# Patient Record
Sex: Female | Born: 1955 | Race: Black or African American | Hispanic: No | Marital: Single | Smoking: Current every day smoker
Health system: Southern US, Community
[De-identification: ages and names within clinical notes are randomized; demographics above are authoritative.]

## PROBLEM LIST (undated history)

## (undated) DIAGNOSIS — F329 Major depressive disorder, single episode, unspecified: Secondary | ICD-10-CM

## (undated) DIAGNOSIS — F32A Depression, unspecified: Secondary | ICD-10-CM

## (undated) DIAGNOSIS — F419 Anxiety disorder, unspecified: Secondary | ICD-10-CM

## (undated) DIAGNOSIS — M1990 Unspecified osteoarthritis, unspecified site: Secondary | ICD-10-CM

## (undated) DIAGNOSIS — I10 Essential (primary) hypertension: Secondary | ICD-10-CM

## (undated) HISTORY — PX: OTHER SURGICAL HISTORY: SHX169

---

## 2003-12-20 ENCOUNTER — Encounter (INDEPENDENT_AMBULATORY_CARE_PROVIDER_SITE_OTHER): Payer: Self-pay | Admitting: Family Medicine

## 2006-11-23 ENCOUNTER — Ambulatory Visit: Payer: Self-pay | Admitting: Family Medicine

## 2006-12-02 ENCOUNTER — Encounter (INDEPENDENT_AMBULATORY_CARE_PROVIDER_SITE_OTHER): Payer: Self-pay | Admitting: Family Medicine

## 2006-12-03 ENCOUNTER — Encounter (INDEPENDENT_AMBULATORY_CARE_PROVIDER_SITE_OTHER): Payer: Self-pay | Admitting: Family Medicine

## 2006-12-09 ENCOUNTER — Ambulatory Visit: Payer: Self-pay | Admitting: Family Medicine

## 2007-01-06 ENCOUNTER — Ambulatory Visit: Payer: Self-pay | Admitting: Family Medicine

## 2007-01-10 ENCOUNTER — Encounter: Payer: Self-pay | Admitting: Family Medicine

## 2007-01-10 DIAGNOSIS — M545 Low back pain, unspecified: Secondary | ICD-10-CM | POA: Insufficient documentation

## 2007-01-10 DIAGNOSIS — G43909 Migraine, unspecified, not intractable, without status migrainosus: Secondary | ICD-10-CM | POA: Insufficient documentation

## 2007-01-10 DIAGNOSIS — I10 Essential (primary) hypertension: Secondary | ICD-10-CM | POA: Insufficient documentation

## 2007-01-10 DIAGNOSIS — F411 Generalized anxiety disorder: Secondary | ICD-10-CM | POA: Insufficient documentation

## 2007-01-10 DIAGNOSIS — K279 Peptic ulcer, site unspecified, unspecified as acute or chronic, without hemorrhage or perforation: Secondary | ICD-10-CM | POA: Insufficient documentation

## 2007-01-10 DIAGNOSIS — F329 Major depressive disorder, single episode, unspecified: Secondary | ICD-10-CM

## 2007-01-10 DIAGNOSIS — F32A Depression, unspecified: Secondary | ICD-10-CM | POA: Insufficient documentation

## 2007-01-10 DIAGNOSIS — E785 Hyperlipidemia, unspecified: Secondary | ICD-10-CM | POA: Insufficient documentation

## 2007-01-10 DIAGNOSIS — M1990 Unspecified osteoarthritis, unspecified site: Secondary | ICD-10-CM | POA: Insufficient documentation

## 2007-01-13 ENCOUNTER — Encounter (INDEPENDENT_AMBULATORY_CARE_PROVIDER_SITE_OTHER): Payer: Self-pay | Admitting: Family Medicine

## 2007-06-06 ENCOUNTER — Encounter (INDEPENDENT_AMBULATORY_CARE_PROVIDER_SITE_OTHER): Payer: Self-pay | Admitting: Family Medicine

## 2007-06-07 ENCOUNTER — Telehealth (INDEPENDENT_AMBULATORY_CARE_PROVIDER_SITE_OTHER): Payer: Self-pay | Admitting: Family Medicine

## 2007-06-08 ENCOUNTER — Encounter (INDEPENDENT_AMBULATORY_CARE_PROVIDER_SITE_OTHER): Payer: Self-pay | Admitting: Family Medicine

## 2007-06-16 ENCOUNTER — Ambulatory Visit: Payer: Self-pay | Admitting: Family Medicine

## 2007-06-16 LAB — CONVERTED CEMR LAB
Cholesterol, target level: 200 mg/dL
HDL goal, serum: 40 mg/dL
LDL Goal: 130 mg/dL

## 2007-06-18 ENCOUNTER — Encounter (INDEPENDENT_AMBULATORY_CARE_PROVIDER_SITE_OTHER): Payer: Self-pay | Admitting: Family Medicine

## 2007-06-19 LAB — CONVERTED CEMR LAB
Potassium: 3.9 meq/L (ref 3.5–5.3)
Sodium: 140 meq/L (ref 135–145)

## 2007-06-20 ENCOUNTER — Telehealth (INDEPENDENT_AMBULATORY_CARE_PROVIDER_SITE_OTHER): Payer: Self-pay | Admitting: *Deleted

## 2007-06-20 ENCOUNTER — Encounter (INDEPENDENT_AMBULATORY_CARE_PROVIDER_SITE_OTHER): Payer: Self-pay | Admitting: Family Medicine

## 2007-07-14 ENCOUNTER — Encounter (INDEPENDENT_AMBULATORY_CARE_PROVIDER_SITE_OTHER): Payer: Self-pay | Admitting: Family Medicine

## 2007-07-14 ENCOUNTER — Telehealth (INDEPENDENT_AMBULATORY_CARE_PROVIDER_SITE_OTHER): Payer: Self-pay | Admitting: *Deleted

## 2007-07-14 ENCOUNTER — Other Ambulatory Visit: Admission: RE | Admit: 2007-07-14 | Discharge: 2007-07-14 | Payer: Self-pay | Admitting: Family Medicine

## 2007-07-14 ENCOUNTER — Ambulatory Visit: Payer: Self-pay | Admitting: Family Medicine

## 2007-07-15 ENCOUNTER — Telehealth (INDEPENDENT_AMBULATORY_CARE_PROVIDER_SITE_OTHER): Payer: Self-pay | Admitting: *Deleted

## 2007-07-15 LAB — CONVERTED CEMR LAB
Eosinophils Absolute: 0.2 10*3/uL (ref 0.0–0.7)
Eosinophils Relative: 4 % (ref 0–5)
HCT: 41.7 % (ref 36.0–46.0)
Hemoglobin: 13.9 g/dL (ref 12.0–15.0)
Lymphs Abs: 2.5 10*3/uL (ref 0.7–3.3)
MCV: 91.2 fL (ref 78.0–100.0)
Monocytes Relative: 7 % (ref 3–11)
RBC: 4.57 M/uL (ref 3.87–5.11)
WBC: 5.2 10*3/uL (ref 4.0–10.5)

## 2007-07-19 ENCOUNTER — Ambulatory Visit (HOSPITAL_COMMUNITY): Admission: RE | Admit: 2007-07-19 | Discharge: 2007-07-19 | Payer: Self-pay | Admitting: Family Medicine

## 2007-07-20 ENCOUNTER — Telehealth (INDEPENDENT_AMBULATORY_CARE_PROVIDER_SITE_OTHER): Payer: Self-pay | Admitting: *Deleted

## 2007-08-24 ENCOUNTER — Ambulatory Visit: Payer: Self-pay | Admitting: Family Medicine

## 2007-09-26 ENCOUNTER — Encounter (INDEPENDENT_AMBULATORY_CARE_PROVIDER_SITE_OTHER): Payer: Self-pay | Admitting: Family Medicine

## 2007-10-08 ENCOUNTER — Encounter (INDEPENDENT_AMBULATORY_CARE_PROVIDER_SITE_OTHER): Payer: Self-pay | Admitting: Family Medicine

## 2008-02-12 ENCOUNTER — Emergency Department (HOSPITAL_COMMUNITY): Admission: EM | Admit: 2008-02-12 | Discharge: 2008-02-13 | Payer: Self-pay | Admitting: Emergency Medicine

## 2008-02-14 ENCOUNTER — Ambulatory Visit: Payer: Self-pay | Admitting: Family Medicine

## 2008-02-27 ENCOUNTER — Encounter (INDEPENDENT_AMBULATORY_CARE_PROVIDER_SITE_OTHER): Payer: Self-pay | Admitting: Family Medicine

## 2008-02-27 ENCOUNTER — Telehealth (INDEPENDENT_AMBULATORY_CARE_PROVIDER_SITE_OTHER): Payer: Self-pay | Admitting: *Deleted

## 2008-03-13 ENCOUNTER — Ambulatory Visit: Payer: Self-pay | Admitting: Family Medicine

## 2008-03-13 DIAGNOSIS — D649 Anemia, unspecified: Secondary | ICD-10-CM | POA: Insufficient documentation

## 2008-03-13 DIAGNOSIS — R809 Proteinuria, unspecified: Secondary | ICD-10-CM | POA: Insufficient documentation

## 2008-03-14 ENCOUNTER — Encounter (INDEPENDENT_AMBULATORY_CARE_PROVIDER_SITE_OTHER): Payer: Self-pay | Admitting: Family Medicine

## 2008-03-14 ENCOUNTER — Telehealth (INDEPENDENT_AMBULATORY_CARE_PROVIDER_SITE_OTHER): Payer: Self-pay | Admitting: *Deleted

## 2008-03-14 LAB — CONVERTED CEMR LAB
AST: 14 units/L (ref 0–37)
Albumin: 4.5 g/dL (ref 3.5–5.2)
Alkaline Phosphatase: 88 units/L (ref 39–117)
BUN: 12 mg/dL (ref 6–23)
Basophils Relative: 0 % (ref 0–1)
Eosinophils Absolute: 0.1 10*3/uL (ref 0.0–0.7)
Eosinophils Relative: 2 % (ref 0–5)
HCT: 33.8 % — ABNORMAL LOW (ref 36.0–46.0)
HDL: 50 mg/dL (ref >39)
LDL Cholesterol: 136 mg/dL — ABNORMAL HIGH (ref 0–99)
MCHC: 33.1 g/dL (ref 30.0–36.0)
MCV: 89.2 fL (ref 78.0–100.0)
Monocytes Relative: 7 % (ref 3–12)
Neutrophils Relative %: 42 % — ABNORMAL LOW (ref 43–77)
Platelets: 192 10*3/uL (ref 150–400)
Potassium: 3.4 meq/L — ABNORMAL LOW (ref 3.5–5.3)
Sodium: 141 meq/L (ref 135–145)
TSH: 1.116 microintl units/mL (ref 0.350–5.50)
Total Bilirubin: 0.4 mg/dL (ref 0.3–1.2)
Total CHOL/HDL Ratio: 4.4
Triglycerides: 165 mg/dL — ABNORMAL HIGH (ref <150)
VLDL: 33 mg/dL (ref 0–40)

## 2008-03-22 ENCOUNTER — Encounter (INDEPENDENT_AMBULATORY_CARE_PROVIDER_SITE_OTHER): Payer: Self-pay | Admitting: Family Medicine

## 2008-03-26 ENCOUNTER — Encounter: Payer: Self-pay | Admitting: Orthopedic Surgery

## 2008-03-26 ENCOUNTER — Ambulatory Visit (HOSPITAL_COMMUNITY): Admission: RE | Admit: 2008-03-26 | Discharge: 2008-03-26 | Payer: Self-pay | Admitting: Family Medicine

## 2008-03-27 ENCOUNTER — Telehealth (INDEPENDENT_AMBULATORY_CARE_PROVIDER_SITE_OTHER): Payer: Self-pay | Admitting: *Deleted

## 2008-06-28 ENCOUNTER — Ambulatory Visit: Payer: Self-pay | Admitting: Family Medicine

## 2008-06-28 DIAGNOSIS — F17200 Nicotine dependence, unspecified, uncomplicated: Secondary | ICD-10-CM | POA: Insufficient documentation

## 2008-06-29 LAB — CONVERTED CEMR LAB
Basophils Absolute: 0 10*3/uL (ref 0.0–0.1)
Basophils Relative: 0 % (ref 0–1)
Eosinophils Absolute: 0.1 10*3/uL (ref 0.0–0.7)
Eosinophils Relative: 2 % (ref 0–5)
HCT: 36.9 % (ref 36.0–46.0)
Hemoglobin: 12.6 g/dL (ref 12.0–15.0)
MCHC: 34.1 g/dL (ref 30.0–36.0)
MCV: 88.7 fL (ref 78.0–100.0)
Monocytes Absolute: 0.5 10*3/uL (ref 0.1–1.0)
Monocytes Relative: 8 % (ref 3–12)
RBC: 4.16 M/uL (ref 3.87–5.11)
RDW: 13.5 % (ref 11.5–15.5)
TIBC: 376 ug/dL (ref 250–470)

## 2008-07-13 ENCOUNTER — Telehealth (INDEPENDENT_AMBULATORY_CARE_PROVIDER_SITE_OTHER): Payer: Self-pay | Admitting: *Deleted

## 2008-07-23 ENCOUNTER — Encounter (INDEPENDENT_AMBULATORY_CARE_PROVIDER_SITE_OTHER): Payer: Self-pay | Admitting: Family Medicine

## 2008-08-07 ENCOUNTER — Ambulatory Visit: Payer: Self-pay | Admitting: Family Medicine

## 2008-08-07 DIAGNOSIS — J309 Allergic rhinitis, unspecified: Secondary | ICD-10-CM | POA: Insufficient documentation

## 2008-08-07 DIAGNOSIS — K219 Gastro-esophageal reflux disease without esophagitis: Secondary | ICD-10-CM | POA: Insufficient documentation

## 2008-09-19 ENCOUNTER — Encounter (INDEPENDENT_AMBULATORY_CARE_PROVIDER_SITE_OTHER): Payer: Self-pay | Admitting: Family Medicine

## 2008-10-08 ENCOUNTER — Telehealth (INDEPENDENT_AMBULATORY_CARE_PROVIDER_SITE_OTHER): Payer: Self-pay | Admitting: *Deleted

## 2008-11-06 ENCOUNTER — Ambulatory Visit: Payer: Self-pay | Admitting: Family Medicine

## 2008-12-10 ENCOUNTER — Telehealth (INDEPENDENT_AMBULATORY_CARE_PROVIDER_SITE_OTHER): Payer: Self-pay | Admitting: Family Medicine

## 2009-08-16 ENCOUNTER — Encounter (INDEPENDENT_AMBULATORY_CARE_PROVIDER_SITE_OTHER): Payer: Self-pay | Admitting: Family Medicine

## 2009-09-03 ENCOUNTER — Emergency Department (HOSPITAL_COMMUNITY): Admission: EM | Admit: 2009-09-03 | Discharge: 2009-09-03 | Payer: Self-pay | Admitting: Emergency Medicine

## 2010-07-07 ENCOUNTER — Ambulatory Visit: Payer: Self-pay | Admitting: Orthopedic Surgery

## 2010-07-07 DIAGNOSIS — M169 Osteoarthritis of hip, unspecified: Secondary | ICD-10-CM | POA: Insufficient documentation

## 2010-07-08 ENCOUNTER — Encounter: Payer: Self-pay | Admitting: Orthopedic Surgery

## 2010-07-08 ENCOUNTER — Telehealth: Payer: Self-pay | Admitting: Orthopedic Surgery

## 2010-08-29 ENCOUNTER — Encounter: Payer: Self-pay | Admitting: Orthopedic Surgery

## 2011-01-06 NOTE — Letter (Signed)
Summary: History form  History form   Imported By: Jacklynn Ganong 07/08/2010 14:20:23  _____________________________________________________________________  External Attachment:    Type:   Image     Comment:   External Document

## 2011-01-06 NOTE — Progress Notes (Signed)
Summary: Referral to [facility].  Phone Note Outgoing Call   Call placed by: Waldon Reining,  July 08, 2010 9:28 AM Call placed to: Specialist Action Taken: Information Sent Summary of Call: I faxed a referral for this patient to [facility] for a hip replacement.

## 2011-01-06 NOTE — Letter (Signed)
Summary: *Orthopedic Consult Note  [facility]  [address]  [place], [place] [address]   Phone: [phone]  Fax: [phone]    Re:    Nicole Ibarra DOB:    06/28/1956   Dear: Dr. Regino Schultze   Thank you for requesting that we see the above patient for consultation.  A copy of the detailed office note will be sent under separate cover, for your review.  Evaluation today is consistent with: osteoarthritis RIGHT hip severe   Our recommendation is for: referral to [facility] because she is uninsured and will require an extensive reconstruction       Thank you for this opportunity to look after your patient.  Sincerely,   Terrance Mass. MD.

## 2011-01-06 NOTE — Assessment & Plan Note (Signed)
Summary: rt hip pain needs xr/aware to pay $100/bsf   Vital Signs:  Patient profile:   55 year old female Height:      64 inches Weight:      208 pounds Pulse rate:   72 / minute Resp:     16 per minute  Vitals Entered By: Fuller Canada MD (July 07, 2010 3:18 PM)  Visit Type:  new patient Referring Provider:  self Primary Provider:  Dr. Regino Schultze  CC:  right hip pain.  History of Present Illness: I saw Nicole Ibarra in the office today for an initial visit.  She is a 55 years old woman with the complaint of:  right hip pain.  Needs xrays.  2009 right hip and pelvis xrays [facility] for review.  Meds: Doxazosyn, Lisinopril, Meloxicam, Citalopram, Amlodipine, Hydrocodone 5.  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  Her pain is related as a 10 out of 10.  She complains of stiffness does not have a leg length discrepancy at this time.  Occasionally she'll have some LEFT hip pain she denies any numbness she has some mild discomfort in her back at times.  Anti-inflammatories and Vicodin 5 mg have not provided relief and she comes in for evaluation with previous x-rays 2 years ago showing arthritis of the RIGHT hip  Allergies (verified): No Known Drug Allergies  Family History: Father: Deceased: 2s HTN and prostate cancer  Mother: 12 HTN, DM Siblings: sisters x 5 - HTN Brother: 2 x: One 35 colon cancer. Diagnosed in his 3s. Other in his 63s - W&L FH of Cancer:  Family History of Arthritis  Social History: Current Smoker 5 per day Occupation: CNA No ETOH no alcohol no caffeine Single No drug use. 12th grade ed.  Review of Systems Constitutional:  Denies weight loss, weight gain, fever, chills, and fatigue. Cardiovascular:  Denies chest pain, palpitations, fainting, and murmurs. Respiratory:   Denies short of breath, wheezing, couch, tightness, pain on inspiration, and snoring . Gastrointestinal:  Complains of heartburn and constipation; denies nausea, vomiting, diarrhea, and blood in your stools. Genitourinary:  Denies frequency, urgency, difficulty urinating, painful urination, flank pain, and bleeding in urine. Neurologic:  Denies numbness, tingling, unsteady gait, dizziness, tremors, and seizure. Musculoskeletal:  Complains of joint pain; denies swelling, instability, stiffness, redness, heat, and muscle pain. Endocrine:  Denies excessive thirst, exessive urination, and heat or cold intolerance. Psychiatric:  Denies nervousness, depression, anxiety, and hallucinations. Skin:  Denies changes in the skin, poor healing, rash, itching, and redness. HEENT:  Denies blurred or double vision, eye pain, redness, and watering. Immunology:  Denies seasonal allergies, sinus problems, and allergic to bee stings. Hemoatologic:  Denies easy bleeding and brusing.  Physical Exam  Msk:  The patient is well developed and nourished, with normal grooming and hygiene. The body habitus is  medium  Vital signs are stable as recorded in her height is 5 foot 4 her weight is 208 pounds Pulses:  pulses normal in all 4 extremities Extremities:  no clubbing, cyanosis, edema, or deformity noted with normal full range of motion of all joints  This is one of the stiffest hips  I have ever seen, she only had 60 of hip flexion, I could not internally or externally rotate the hip.  I can  only abduct to 5 and adductor 10.  Her LEFT hip flexed about 90.  No leg length discrepancy  Muscle tone normal  Hip stability normal  No greater trochanteric tenderness there  Neurologic:  The coordination and sensation were normal  The reflexes were normal   Skin:  intact without lesions or rashes Inguinal Nodes:  no significant adenopathy Psych:  alert and cooperative; normal mood and affect; normal attention  span and concentration   Impression & Recommendations:  Problem # 1:  OSTEOARTHRITIS, HIP, RIGHT (ICD-715.95) Assessment New radiographs were obtained today and compared to previous films done at the hospital  Pelvis was done and AP lateral RIGHT hip  The hip is  captured in the acetabulum by surrounding osteophytes.  This reconstruction is more than I can handle and I recommended she go to [facility].  A referral to [facility] is probably not going to be successful because she is uninsured   Orders: Orthopedic Surgeon Referral Pharmacist, hospital) New Patient Level III [phone]) Pelvis x-ray, 1/2 views (60454) Hip x-ray unilateral complete, minimum 2 views (09811)  Patient Instructions: 1)  You need a hip replacement  2)  Referral to [facility] for Total Hip Repalcement

## 2011-01-06 NOTE — Letter (Signed)
Summary: Medical record request Disab Determination  Medical record request Disab Determination   Imported By: Cammie Sickle 09/27/2010 19:22:07  _____________________________________________________________________  External Attachment:    Type:   Image     Comment:   External Document

## 2011-08-31 LAB — CBC
MCHC: 34.8
MCV: 88.8
RBC: 3.86 — ABNORMAL LOW
RDW: 13.9

## 2011-08-31 LAB — BASIC METABOLIC PANEL
CO2: 25
Calcium: 9.5
Chloride: 104
Creatinine, Ser: 1.25 — ABNORMAL HIGH
GFR calc Af Amer: 54 — ABNORMAL LOW
Glucose, Bld: 115 — ABNORMAL HIGH

## 2011-08-31 LAB — DIFFERENTIAL
Basophils Absolute: 0
Basophils Relative: 1
Eosinophils Absolute: 0.3
Monocytes Absolute: 0.5
Monocytes Relative: 8
Neutro Abs: 2.7
Neutrophils Relative %: 46

## 2011-09-05 ENCOUNTER — Emergency Department (HOSPITAL_COMMUNITY)
Admission: EM | Admit: 2011-09-05 | Discharge: 2011-09-05 | Disposition: A | Discharge status: Home / Self Care | Payer: Self-pay | Attending: Emergency Medicine | Admitting: Emergency Medicine

## 2011-09-05 ENCOUNTER — Emergency Department (HOSPITAL_COMMUNITY): Payer: Self-pay

## 2011-09-05 ENCOUNTER — Other Ambulatory Visit: Payer: Self-pay

## 2011-09-05 ENCOUNTER — Encounter: Payer: Self-pay | Admitting: Emergency Medicine

## 2011-09-05 DIAGNOSIS — F439 Reaction to severe stress, unspecified: Secondary | ICD-10-CM

## 2011-09-05 DIAGNOSIS — Z733 Stress, not elsewhere classified: Secondary | ICD-10-CM | POA: Insufficient documentation

## 2011-09-05 DIAGNOSIS — F3289 Other specified depressive episodes: Secondary | ICD-10-CM | POA: Insufficient documentation

## 2011-09-05 DIAGNOSIS — F329 Major depressive disorder, single episode, unspecified: Secondary | ICD-10-CM | POA: Insufficient documentation

## 2011-09-05 DIAGNOSIS — I10 Essential (primary) hypertension: Secondary | ICD-10-CM | POA: Insufficient documentation

## 2011-09-05 DIAGNOSIS — R079 Chest pain, unspecified: Secondary | ICD-10-CM | POA: Insufficient documentation

## 2011-09-05 DIAGNOSIS — R110 Nausea: Secondary | ICD-10-CM | POA: Insufficient documentation

## 2011-09-05 HISTORY — DX: Essential (primary) hypertension: I10

## 2011-09-05 HISTORY — DX: Unspecified osteoarthritis, unspecified site: M19.90

## 2011-09-05 HISTORY — DX: Depression, unspecified: F32.A

## 2011-09-05 HISTORY — DX: Anxiety disorder, unspecified: F41.9

## 2011-09-05 HISTORY — DX: Major depressive disorder, single episode, unspecified: F32.9

## 2011-09-05 LAB — CARDIAC PANEL(CRET KIN+CKTOT+MB+TROPI): CK, MB: 3.6 ng/mL (ref 0.3–4.0)

## 2011-09-05 LAB — URINALYSIS, ROUTINE W REFLEX MICROSCOPIC
Bilirubin Urine: NEGATIVE
Hgb urine dipstick: NEGATIVE
Ketones, ur: NEGATIVE mg/dL
Protein, ur: NEGATIVE mg/dL
Specific Gravity, Urine: 1.01 (ref 1.005–1.030)
Urobilinogen, UA: 0.2 mg/dL (ref 0.0–1.0)

## 2011-09-05 LAB — CBC
Hemoglobin: 12.1 g/dL (ref 12.0–15.0)
MCH: 31 pg (ref 26.0–34.0)
MCHC: 34 g/dL (ref 30.0–36.0)
MCV: 91.3 fL (ref 78.0–100.0)
RBC: 3.9 MIL/uL (ref 3.87–5.11)

## 2011-09-05 LAB — DIFFERENTIAL
Basophils Relative: 0 % (ref 0–1)
Eosinophils Absolute: 0.1 10*3/uL (ref 0.0–0.7)
Eosinophils Relative: 2 % (ref 0–5)
Lymphs Abs: 1.7 10*3/uL (ref 0.7–4.0)
Monocytes Relative: 6 % (ref 3–12)

## 2011-09-05 LAB — BASIC METABOLIC PANEL
BUN: 13 mg/dL (ref 6–23)
Calcium: 9.8 mg/dL (ref 8.4–10.5)
Creatinine, Ser: 1.02 mg/dL (ref 0.50–1.10)
GFR calc non Af Amer: 56 mL/min — ABNORMAL LOW (ref >60)
Glucose, Bld: 98 mg/dL (ref 70–99)

## 2011-09-05 MED ORDER — SODIUM CHLORIDE 0.9 % IV SOLN
INTRAVENOUS | Status: DC
  Administered 2011-09-05: 16:00:00 via INTRAVENOUS

## 2011-09-05 NOTE — ED Provider Notes (Signed)
Scribed for Flint Melter, MD, the patient was seen in room APA02/APA02. This chart was scribed by [employer]. The patient's care started at 15:06  CSN: [id number] Arrival date & time: 09/05/2011  2:40 PM  Chief Complaint  Patient presents with  . Chest Pain   HPI Nicole Ibarra is a 55 y.o. female with a history of HTN who presents to the Emergency Department complaining of mid sternal Chest pain starting at about 2p.m 09/05/2011. Associated symptoms include currently resolved nausea.Chest pain is without provocation but is alleviated by laying still. Per family, patient is under a lot of stress in her relationship with her boyfriend. Patient appears depressed and is tearful upon examination. Denies vomiting, shortness of breath, alcohol use, smoking tobacco or drug abuse, or a history of heart problems. Reports mild relief from chest pain upon examination.  HPI ELEMENTS: Location: Mid Sternal chest  Onset: 2pm 09/05/2011 Duration: 1 hour  Timing: constant Severity: 8/10 on NPS  Modifying factors: alleviated by laying still Context: as above  Associated symptoms: as above     Past Medical History  Diagnosis Date  . Hypertension   . Arthritis   . Depression   . Anxiety     History reviewed. No pertinent past surgical history.  History reviewed. No pertinent family history.  History  Substance Use Topics  . Smoking status: Never Smoker   . Smokeless tobacco: Not on file  . Alcohol Use: No    OB History    Grav Para Term Preterm Abortions TAB SAB Ect Mult Living                  Review of Systems  Cardiovascular: Positive for chest pain.  Gastrointestinal: Positive for nausea. Negative for vomiting and diarrhea.  All other systems reviewed and are negative.    Allergies  Review of patient's allergies indicates no known allergies.  Home Medications  No current outpatient prescriptions on file.  BP 204/84  Pulse 65  Temp(Src) 98.5 F (36.9 C) (Oral)   Resp 20  Ht 5\' 4"  (1.626 m)  Wt 214 lb (97.07 kg)  BMI 36.73 kg/m2  SpO2 100%  Physical Exam  Nursing note and vitals reviewed. Constitutional: She is oriented to person, place, and time. She appears well-developed and well-nourished. No distress.  HENT:  Head: Normocephalic and atraumatic.  Right Ear: External ear normal.  Left Ear: External ear normal.  Mouth/Throat: Oropharynx is clear and moist. No oropharyngeal exudate.  Eyes: Conjunctivae and EOM are normal. Pupils are equal, round, and reactive to light.  Neck: Normal range of motion. Neck supple. No tracheal deviation present.  Cardiovascular: Normal rate, regular rhythm and normal heart sounds.   No murmur heard. Pulmonary/Chest: Effort normal and breath sounds normal. No respiratory distress. She has no wheezes. She has no rales. She exhibits tenderness (Right anterior chest wall tenderness).  Abdominal: Soft. Bowel sounds are normal. She exhibits no distension. There is no tenderness. There is no rebound and no guarding.  Musculoskeletal:       No spinal tenderness Limited ROM in the right hip due to arthritis Normal ROM in upper extremities  Lymphadenopathy:    She has no cervical adenopathy.  Neurological: She is alert and oriented to person, place, and time. No cranial nerve deficit.  Skin: Skin is warm and dry. No rash noted. She is not diaphoretic. No erythema.  Psychiatric:       Patient is depressed and tearful    ED Course  Procedures  ED ECG REPORT   Date: 09/05/2011  EKG Time: 3:45 PM  Rate: 68           Rhythm: normal sinus rhythm,   Axis: normal  Intervals:none  ST&T Change: none  Narrative Interpretation: PAC, Normal QRS           OTHER DATA REsults Nursing notes, vital signs, and past medical records reviewed.  EKG: {ekg findings:315101::"normal EKG, normal sinus rhythm","unchanged from previous tracings"  DIAGNOSTIC STUDIES: Oxygen Saturation is 100% on room air, normal by my  interpretation.    LABS / RADIOLOGY: Reviewed-NAD  Results for orders placed during the hospital encounter of 09/05/11  CARDIAC PANEL(CRET KIN+CKTOT+MB+TROPI)      Component Value Range   Total CK 182 (*) 7 - 177 (U/L)   CK, MB 3.6  0.3 - 4.0 (ng/mL)   Troponin I <0.30  <0.30 (ng/mL)   Relative Index 2.0  0.0 - 2.5   CBC      Component Value Range   WBC 5.2  4.0 - 10.5 (K/uL)   RBC 3.90  3.87 - 5.11 (MIL/uL)   Hemoglobin 12.1  12.0 - 15.0 (g/dL)   HCT 16.1 (*) 09.6 - 46.0 (%)   MCV 91.3  78.0 - 100.0 (fL)   MCH 31.0  26.0 - 34.0 (pg)   MCHC 34.0  30.0 - 36.0 (g/dL)   RDW 04.5  40.9 - 81.1 (%)   Platelets 171  150 - 400 (K/uL)  DIFFERENTIAL      Component Value Range   Neutrophils Relative 59  43 - 77 (%)   Neutro Abs 3.1  1.7 - 7.7 (K/uL)   Lymphocytes Relative 33  12 - 46 (%)   Lymphs Abs 1.7  0.7 - 4.0 (K/uL)   Monocytes Relative 6  3 - 12 (%)   Monocytes Absolute 0.3  0.1 - 1.0 (K/uL)   Eosinophils Relative 2  0 - 5 (%)   Eosinophils Absolute 0.1  0.0 - 0.7 (K/uL)   Basophils Relative 0  0 - 1 (%)   Basophils Absolute 0.0  0.0 - 0.1 (K/uL)  BASIC METABOLIC PANEL      Component Value Range   Sodium 141  135 - 145 (mEq/L)   Potassium 3.9  3.5 - 5.1 (mEq/L)   Chloride 105  96 - 112 (mEq/L)   CO2 28  19 - 32 (mEq/L)   Glucose, Bld 98  70 - 99 (mg/dL)   BUN 13  6 - 23 (mg/dL)   Creatinine, Ser 9.14  0.50 - 1.10 (mg/dL)   Calcium 9.8  8.4 - 78.2 (mg/dL)   GFR calc non Af Amer 56 (*) >60 (mL/min)   GFR calc Af Amer >60  >60 (mL/min)     Dg Chest 2 View  09/05/2011  *RADIOLOGY REPORT*  Clinical Data: Chest pain.  CHEST - 2 VIEW  Comparison:  None.  Findings:  The heart size and mediastinal contours are within normal limits.  Both lungs are clear.  The visualized skeletal structures are unremarkable.  IMPRESSION: No active cardiopulmonary disease.  Original Report Authenticated By: Danae Orleans, M.D.      ED COURSE / COORDINATION OF CARE: 15:18 - EDP examined patient  and ordered the following Orders Placed This Encounter  Procedures  . DG Chest 2 View  . Cardiac panel(cret kin+cktot+mb+tropi)  . CBC  . Differential  . Basic metabolic panel  . Urinalysis, Routine w reflex microscopic  . Cardiac monitoring  .  ED EKG   trending of, vital signs, in the emergency department, showed persistent, hypertension, with the remainder, normal. Her blood pressure at 1638 is 199/85.  MDM: Chest pain is atypical for cardiac disease. Patient has significant psychosocial stressors. ED evaluation for causes of cardiac, pulmonary, infectious processes are negative. Doubt pneumonia, ACS, pulmonary embolus. The patient has hypertension by history in his mildly elevated in the emergency department without signs of end organ damage. There is no evidence for hypertensive crisis.  IMPRESSION: #1 nonspecific chest pain #2 Psycho- social stressors #3 HYPERTENSION  PLAN  Home Diagnostic tests were reviewed and questions answered. Diagnosis, care plan and treatment options were discussed. The patient understand instructions and will follow up as directed. The patient and sister was given verbal chest pain instructionsThe patient is to return the emergency department if there is any worsening of symptoms.   CONDITION ON DISCHARGE: Good  MEDICATIONS GIVEN IN THE E.D.  Medications  0.9 %  sodium chloride infusion (not administered)    DISCHARGE MEDICATIONS: New Prescriptions   No medications on file    SCRIBE ATTESTATION: I personally performed the services described in this documentation, which was scribed in my presence. The recorded information has been reviewed and considered. Flint Melter, MD     Flint Melter, MD 09/05/11 [phone]

## 2011-09-05 NOTE — ED Notes (Signed)
Pt ambulated to restroom to collect urine specimen. NAD.

## 2011-09-05 NOTE — ED Notes (Signed)
Pt states non radiating CP to center chest which began one hour ago. Pt states sharp, intermittent pain. Denies pain at present time. PT states, "It don't hurt if I'm lying still." Hurts worse with movement. O2 applied at 2L via [employer]. Pt states she became nauseated earlier. Pt states she was bending, looking for clothes, when pain began. NAD at this time.

## 2011-09-05 NOTE — ED Notes (Signed)
Pt c/o mid sternal cp x 1 hour ago with some nausea, worse with exertion. Denies sob/v. nad noted.

## 2012-01-06 ENCOUNTER — Encounter (HOSPITAL_COMMUNITY): Payer: Self-pay | Admitting: *Deleted

## 2012-01-06 ENCOUNTER — Emergency Department (HOSPITAL_COMMUNITY)
Admission: EM | Admit: 2012-01-06 | Discharge: 2012-01-06 | Disposition: A | Discharge status: Home / Self Care | Payer: 59 | Attending: Emergency Medicine | Admitting: Emergency Medicine

## 2012-01-06 DIAGNOSIS — I10 Essential (primary) hypertension: Secondary | ICD-10-CM | POA: Insufficient documentation

## 2012-01-06 DIAGNOSIS — R040 Epistaxis: Secondary | ICD-10-CM | POA: Insufficient documentation

## 2012-01-06 DIAGNOSIS — M129 Arthropathy, unspecified: Secondary | ICD-10-CM | POA: Insufficient documentation

## 2012-01-06 MED ORDER — OXYMETAZOLINE HCL 0.05 % NA SOLN
2.0000 | Freq: Once | NASAL | Status: DC
  Filled 2012-01-06: qty 15

## 2012-01-06 MED ORDER — OXYMETAZOLINE HCL 0.05 % NA SOLN
2.0000 | Freq: Once | NASAL | Status: AC
  Administered 2012-01-06: 2 via NASAL

## 2012-01-06 NOTE — ED Provider Notes (Signed)
History     CSN: [id number]  Arrival date & time 01/06/12  1317   First MD Initiated Contact with Patient 01/06/12 1341      Chief Complaint  Patient presents with  . Epistaxis    (Consider location/radiation/quality/duration/timing/severity/associated sxs/prior treatment) HPI Comments: She has not taken her meds today.  Patient is a 56 y.o. female presenting with nosebleeds. The history is provided by the patient. No language interpreter was used.  Epistaxis  This is a new problem. Episode onset: had episode this AM lasting about 1/2 hr.  also, had episode 3 days ago lasting the same. The problem has been resolved. The problem is associated with an unknown factor. The bleeding has been from the right nare. She has tried nothing for the symptoms. Her past medical history is significant for colds. Her past medical history does not include bleeding disorder, sinus problems, allergies, nose-picking or frequent nosebleeds.    Past Medical History  Diagnosis Date  . Hypertension   . Arthritis   . Depression   . Anxiety     History reviewed. No pertinent past surgical history.  No family history on file.  History  Substance Use Topics  . Smoking status: Former Games developer  . Smokeless tobacco: Not on file  . Alcohol Use: No    OB History    Grav Para Term Preterm Abortions TAB SAB Ect Mult Living                  Review of Systems  HENT: Positive for nosebleeds.   All other systems reviewed and are negative.    Allergies  Review of patient's allergies indicates no known allergies.  Home Medications   Current Outpatient Rx  Name Route Sig Dispense Refill  . AMLODIPINE BESYLATE 10 MG PO TABS Oral Take 10 mg by mouth daily.      Marland Kitchen HYDROCODONE-ACETAMINOPHEN 5-500 MG PO TABS Oral Take 1 tablet by mouth every 6 (six) hours as needed. pain     . MELOXICAM 15 MG PO TABS Oral Take 15 mg by mouth daily.      . SUPER STRESS 600/BIOTIN PO Oral Take 1 tablet by mouth daily.         BP 181/91  Pulse 79  Temp(Src) 98.3 F (36.8 C) (Oral)  Resp 18  Ht 5\' 4"  (1.626 m)  Wt 210 lb (95.255 kg)  BMI 36.05 kg/m2  SpO2 100%  Physical Exam  Nursing note and vitals reviewed. Constitutional: She is oriented to person, place, and time. She appears well-developed and well-nourished. No distress.  HENT:  Head: Normocephalic and atraumatic.  Nose: Epistaxis is observed.       Source of previous bleeding evident on R nasal septum.  No active bleeding.  Pt does not want the area cauterized.  Eyes: EOM are normal.  Neck: Normal range of motion.  Cardiovascular: Normal rate, regular rhythm and normal heart sounds.   Pulmonary/Chest: Effort normal and breath sounds normal.  Abdominal: Soft. She exhibits no distension. There is no tenderness.  Musculoskeletal: Normal range of motion.  Neurological: She is alert and oriented to person, place, and time.  Skin: Skin is warm and dry.  Psychiatric: She has a normal mood and affect. Judgment normal.    ED Course  Procedures (including critical care time)  Labs Reviewed - No data to display No results found.   No diagnosis found.    MDM  Pt advised to use the afrin spray for 2-3 days only.  Also told it can raise BP so she needs to take BP meds as directed and return if unable to control bleeding with nostril pinching.        Worthy Rancher, PA 01/06/12 [phone]

## 2012-01-06 NOTE — ED Provider Notes (Signed)
Medical screening examination/treatment/procedure(s) were performed by non-physician practitioner and as supervising physician I was immediately available for consultation/collaboration. Devoria Albe, MD, Armando Gang   Ward Givens, MD 01/06/12 1950

## 2012-01-06 NOTE — ED Notes (Signed)
Nosebleed this am, lasting 20-30 min.  Had similar episode  Sunday.  From rt nostril

## 2015-05-21 ENCOUNTER — Telehealth: Payer: Self-pay

## 2015-05-21 NOTE — Telephone Encounter (Signed)
Pt was referred by Edythe Clarity, PA at [facility] for screening colonoscopy. She will call back with med list and insurance information.

## 2015-05-27 NOTE — Telephone Encounter (Signed)
PT called with med list. Will have to call back with insurance information.

## 2015-06-03 NOTE — Telephone Encounter (Signed)
Letter sent to pt to call with insurance info to complete the triage to be scheduled for the colonoscopy.

## 2015-06-27 ENCOUNTER — Other Ambulatory Visit (HOSPITAL_COMMUNITY): Payer: Self-pay | Admitting: Physician Assistant

## 2015-06-27 DIAGNOSIS — Z1231 Encounter for screening mammogram for malignant neoplasm of breast: Secondary | ICD-10-CM

## 2015-07-03 ENCOUNTER — Ambulatory Visit (HOSPITAL_COMMUNITY)
Admission: RE | Admit: 2015-07-03 | Discharge: 2015-07-03 | Disposition: A | Discharge status: Home / Self Care | Payer: 59 | Source: Ambulatory Visit | Attending: Physician Assistant | Admitting: Physician Assistant

## 2015-07-03 DIAGNOSIS — Z1231 Encounter for screening mammogram for malignant neoplasm of breast: Secondary | ICD-10-CM | POA: Insufficient documentation

## 2015-07-16 ENCOUNTER — Encounter: Payer: Self-pay | Admitting: *Deleted

## 2015-07-17 ENCOUNTER — Other Ambulatory Visit (HOSPITAL_COMMUNITY): Payer: Self-pay | Admitting: Nephrology

## 2015-07-17 DIAGNOSIS — N183 Chronic kidney disease, stage 3 (moderate): Principal | ICD-10-CM

## 2015-07-17 DIAGNOSIS — I129 Hypertensive chronic kidney disease with stage 1 through stage 4 chronic kidney disease, or unspecified chronic kidney disease: Secondary | ICD-10-CM

## 2015-07-19 ENCOUNTER — Other Ambulatory Visit: Payer: Self-pay | Admitting: Obstetrics & Gynecology

## 2015-07-26 ENCOUNTER — Other Ambulatory Visit: Payer: Medicare HMO | Admitting: Obstetrics & Gynecology

## 2015-08-07 ENCOUNTER — Ambulatory Visit (HOSPITAL_COMMUNITY)
Admission: RE | Admit: 2015-08-07 | Discharge: 2015-08-07 | Disposition: A | Discharge status: Home / Self Care | Payer: Medicare HMO | Source: Ambulatory Visit | Attending: Nephrology | Admitting: Nephrology

## 2015-08-07 DIAGNOSIS — N189 Chronic kidney disease, unspecified: Secondary | ICD-10-CM | POA: Insufficient documentation

## 2015-08-07 DIAGNOSIS — I129 Hypertensive chronic kidney disease with stage 1 through stage 4 chronic kidney disease, or unspecified chronic kidney disease: Secondary | ICD-10-CM

## 2015-08-07 DIAGNOSIS — N183 Chronic kidney disease, stage 3 unspecified: Secondary | ICD-10-CM

## 2015-08-20 ENCOUNTER — Encounter: Payer: Self-pay | Admitting: Obstetrics & Gynecology

## 2015-08-20 ENCOUNTER — Ambulatory Visit (INDEPENDENT_AMBULATORY_CARE_PROVIDER_SITE_OTHER): Payer: Medicare HMO | Admitting: Obstetrics & Gynecology

## 2015-08-20 ENCOUNTER — Other Ambulatory Visit (HOSPITAL_COMMUNITY)
Admission: RE | Admit: 2015-08-20 | Discharge: 2015-08-20 | Disposition: A | Discharge status: Home / Self Care | Payer: Medicare HMO | Source: Ambulatory Visit | Attending: Obstetrics & Gynecology | Admitting: Obstetrics & Gynecology

## 2015-08-20 VITALS — BP 140/80 | HR 72 | Ht 63.0 in | Wt 206.0 lb

## 2015-08-20 DIAGNOSIS — Z1151 Encounter for screening for human papillomavirus (HPV): Secondary | ICD-10-CM | POA: Insufficient documentation

## 2015-08-20 DIAGNOSIS — Z01419 Encounter for gynecological examination (general) (routine) without abnormal findings: Secondary | ICD-10-CM

## 2015-08-20 DIAGNOSIS — Z1211 Encounter for screening for malignant neoplasm of colon: Secondary | ICD-10-CM

## 2015-08-20 DIAGNOSIS — Z124 Encounter for screening for malignant neoplasm of cervix: Secondary | ICD-10-CM | POA: Diagnosis present

## 2015-08-20 DIAGNOSIS — Z1212 Encounter for screening for malignant neoplasm of rectum: Secondary | ICD-10-CM | POA: Diagnosis not present

## 2015-08-20 NOTE — Progress Notes (Signed)
Patient ID: Nicole Ibarra, female   DOB: [birth year], 59 y.o.   MRN: [id number] Subjective:     Nicole Ibarra is a 59 y.o. female here for a routine exam.  No LMP recorded. Patient is postmenopausal. No obstetric history on file. Birth Control Method:  menopausal Menstrual Calendar(currently): menopausal  Current complaints: none.   Current acute medical issues:  Hypertension, arthritis in hips, high cholesterol   Recent Gynecologic History No LMP recorded. Patient is postmenopausal. Last Pap: 20 years ago,  normal Last mammogram: last month, normal  Past Medical History  Diagnosis Date  . Hypertension   . Arthritis   . Depression   . Anxiety     Past Surgical History  Procedure Laterality Date  . Rt hip surgery      OB History    No data available      Social History   Social History  . Marital Status: Single    Spouse Name: N/A  . Number of Children: N/A  . Years of Education: N/A   Social History Main Topics  . Smoking status: Former Research scientist (life sciences)  . Smokeless tobacco: Never Used  . Alcohol Use: No  . Drug Use: No  . Sexual Activity: Not Asked   Other Topics Concern  . None   Social History Narrative    Family History  Problem Relation Age of Onset  . Cancer Father   . Hypertension Brother   . Hypertension Daughter      Current outpatient prescriptions:  .  amLODipine (NORVASC) 10 MG tablet, Take 10 mg by mouth daily.  , Disp: , Rfl:  .  aspirin 81 MG tablet, Take 81 mg by mouth daily., Disp: , Rfl:  .  doxazosin (CARDURA) 2 MG tablet, Take 2 mg by mouth daily., Disp: , Rfl:  .  hydrochlorothiazide (HYDRODIURIL) 25 MG tablet, Take 25 mg by mouth daily., Disp: , Rfl:  .  losartan (COZAAR) 50 MG tablet, Take 50 mg by mouth daily., Disp: , Rfl:  .  meloxicam (MOBIC) 15 MG tablet, Take 15 mg by mouth daily. As needed, Disp: , Rfl:  .  pravastatin (PRAVACHOL) 20 MG tablet, Take 20 mg by mouth daily., Disp: , Rfl:  .  HYDROcodone-acetaminophen (VICODIN) 5-500  MG per tablet, Take 1 tablet by mouth every 6 (six) hours as needed. pain , Disp: , Rfl:  .  Multiple Vitamin (SUPER STRESS 600/BIOTIN PO), Take 1 tablet by mouth daily.  , Disp: , Rfl:   Review of Systems  Review of Systems  Constitutional: Negative for fever, chills, weight loss, malaise/fatigue and diaphoresis.  HENT: Negative for hearing loss, ear pain, nosebleeds, congestion, sore throat, neck pain, tinnitus and ear discharge.   Eyes: Negative for blurred vision, double vision, photophobia, pain, discharge and redness.  Respiratory: Negative for cough, hemoptysis, sputum production, shortness of breath, wheezing and stridor.   Cardiovascular: Negative for chest pain, palpitations, orthopnea, claudication, leg swelling and PND.  Gastrointestinal: negative for abdominal pain. Negative for heartburn, nausea, vomiting, diarrhea, constipation, blood in stool and melena.  Genitourinary: Negative for dysuria, urgency, frequency, hematuria and flank pain.  Musculoskeletal: Negative for myalgias, back pain, joint pain and falls.  Skin: Negative for itching and rash.  Neurological: Negative for dizziness, tingling, tremors, sensory change, speech change, focal weakness, seizures, loss of consciousness, weakness and headaches.  Endo/Heme/Allergies: Negative for environmental allergies and polydipsia. Does not bruise/bleed easily.  Psychiatric/Behavioral: Negative for depression, suicidal ideas, hallucinations, memory loss and substance abuse. The  patient is not nervous/anxious and does not have insomnia.        Objective:  Blood pressure 140/80, pulse 72, height 5\' 3"  (1.6 m), weight 206 lb (93.441 kg).   Physical Exam  Vitals reviewed. Constitutional: She is oriented to person, place, and time. She appears well-developed and well-nourished.  HENT:  Head: Normocephalic and atraumatic.        Right Ear: External ear normal.  Left Ear: External ear normal.  Nose: Nose normal.  Mouth/Throat:  Oropharynx is clear and moist.  Eyes: Conjunctivae and EOM are normal. Pupils are equal, round, and reactive to light. Right eye exhibits no discharge. Left eye exhibits no discharge. No scleral icterus.  Neck: Normal range of motion. Neck supple. No tracheal deviation present. No thyromegaly present.  Cardiovascular: Normal rate, regular rhythm, normal heart sounds and intact distal pulses.  Exam reveals no gallop and no friction rub.   No murmur heard. Respiratory: Effort normal and breath sounds normal. No respiratory distress. She has no wheezes. She has no rales. She exhibits no tenderness.  GI: Soft. Bowel sounds are normal. She exhibits no distension and no mass. There is no tenderness. There is no rebound and no guarding.  Genitourinary:  Breasts no masses skin changes or nipple changes bilaterally      Vulva is normal without lesions Vagina is pink moist without discharge Cervix normal in appearance and pap is done Uterus is normal size shape and contour Adnexa is negative with normal sized ovaries  {Rectal    hemoccult negative, normal tone, no masses Musculoskeletal: Normal range of motion. She exhibits no edema and no tenderness.  Neurological: She is alert and oriented to person, place, and time. She has normal reflexes. She displays normal reflexes. No cranial nerve deficit. She exhibits normal muscle tone. Coordination normal.  Skin: Skin is warm and dry. No rash noted. No erythema. No pallor.  Psychiatric: She has a normal mood and affect. Her behavior is normal. Judgment and thought content normal.       Assessment:    Healthy female exam.   menopausal Plan:    Follow up in: 1 year.

## 2015-08-22 LAB — CYTOLOGY - PAP

## 2016-01-22 DIAGNOSIS — Z1389 Encounter for screening for other disorder: Secondary | ICD-10-CM | POA: Diagnosis not present

## 2016-01-22 DIAGNOSIS — M1991 Primary osteoarthritis, unspecified site: Secondary | ICD-10-CM | POA: Diagnosis not present

## 2016-01-22 DIAGNOSIS — Z6835 Body mass index (BMI) 35.0-35.9, adult: Secondary | ICD-10-CM | POA: Diagnosis not present

## 2016-02-25 DIAGNOSIS — M25552 Pain in left hip: Secondary | ICD-10-CM | POA: Diagnosis not present

## 2016-02-25 DIAGNOSIS — M1612 Unilateral primary osteoarthritis, left hip: Secondary | ICD-10-CM | POA: Diagnosis not present

## 2016-03-26 DIAGNOSIS — N182 Chronic kidney disease, stage 2 (mild): Secondary | ICD-10-CM | POA: Diagnosis not present

## 2016-03-26 DIAGNOSIS — Z6834 Body mass index (BMI) 34.0-34.9, adult: Secondary | ICD-10-CM | POA: Diagnosis not present

## 2016-03-26 DIAGNOSIS — Z1389 Encounter for screening for other disorder: Secondary | ICD-10-CM | POA: Diagnosis not present

## 2016-03-26 DIAGNOSIS — I10 Essential (primary) hypertension: Secondary | ICD-10-CM | POA: Diagnosis not present

## 2016-03-26 DIAGNOSIS — L03818 Cellulitis of other sites: Secondary | ICD-10-CM | POA: Diagnosis not present

## 2016-03-26 DIAGNOSIS — E119 Type 2 diabetes mellitus without complications: Secondary | ICD-10-CM | POA: Diagnosis not present

## 2016-03-26 DIAGNOSIS — R011 Cardiac murmur, unspecified: Secondary | ICD-10-CM | POA: Diagnosis not present

## 2016-03-30 DIAGNOSIS — Z01810 Encounter for preprocedural cardiovascular examination: Secondary | ICD-10-CM | POA: Diagnosis not present

## 2016-03-30 DIAGNOSIS — Z01818 Encounter for other preprocedural examination: Secondary | ICD-10-CM | POA: Diagnosis not present

## 2016-03-31 NOTE — Progress Notes (Signed)
Requested EKG done 03/30/2016 from [facility] in [place].

## 2016-03-31 NOTE — Patient Instructions (Addendum)
Nicole Ibarra  03/31/2016   Your procedure is scheduled on: 04/07/2016    Report to [facility]  Entrance take [facility]  elevators to 3rd floor to  [place] at    1120 AM.  Call this number if you have problems the morning of surgery [phone]   Remember: ONLY 1 PERSON MAY GO WITH YOU TO SHORT STAY TO GET  READY MORNING OF [place].  Do not eat food after midnite .  May have clear liquids from 12 midnite until 0730am morning of surgery then nothing by mouth.       Take these medicines the morning of surgery with A SIP OF WATER:                               You may not have any metal on your body including hair pins and              piercings  Do not wear jewelry, make-up, lotions, powders or perfumes, deodorant             Do not wear nail polish.  Do not shave  48 hours prior to surgery.               Do not bring valuables to the hospital. [place].  Contacts, dentures or bridgework may not be worn into surgery.  Leave suitcase in the car. After surgery it may be brought to your room.         Special Instructions: coughing and deep breathing exercises, leg exercises               Please read over the following fact sheets you were given: _____________________________________________________________________                CLEAR LIQUID DIET   Foods Allowed                                                                     Foods Excluded  Coffee and tea, regular and decaf                             liquids that you cannot  Plain Jell-O in any flavor                                             see through such as: Fruit ices (not with fruit pulp)                                     milk, soups, orange juice  Iced Popsicles  All solid food Carbonated beverages, regular and diet                                    Cranberry, grape and apple  juices Sports drinks like Gatorade Lightly seasoned clear broth or consume(fat free) Sugar, honey syrup  Sample Menu Breakfast                                Lunch                                     Supper Cranberry juice                    Beef broth                            Chicken broth Jell-O                                     Grape juice                           Apple juice Coffee or tea                        Jell-O                                      Popsicle                                                Coffee or tea                        Coffee or tea  _____________________________________________________________________  [facility] - Preparing for Surgery Before surgery, you can play an important role.  Because skin is not sterile, your skin needs to be as free of germs as possible.  You can reduce the number of germs on your skin by washing with CHG (chlorahexidine gluconate) soap before surgery.  CHG is an antiseptic cleaner which kills germs and bonds with the skin to continue killing germs even after washing. Please DO NOT use if you have an allergy to CHG or antibacterial soaps.  If your skin becomes reddened/irritated stop using the CHG and inform your nurse when you arrive at Short Stay. Do not shave (including legs and underarms) for at least 48 hours prior to the first CHG shower.  You may shave your face/neck. Please follow these instructions carefully:  1.  Shower with CHG Soap the night before surgery and the  morning of Surgery.  2.  If you choose to wash your hair, wash your hair first as usual with your  normal  shampoo.  3.  After you shampoo, rinse your hair and body thoroughly to remove the  shampoo.  4.  Use CHG as you would any other liquid soap.  You can apply chg directly  to the skin and wash                       Gently with a scrungie or clean washcloth.  5.  Apply the CHG Soap to your body ONLY FROM THE NECK DOWN.   Do not  use on face/ open                           Wound or open sores. Avoid contact with eyes, ears mouth and genitals (private parts).                       Wash face,  Genitals (private parts) with your normal soap.             6.  Wash thoroughly, paying special attention to the area where your surgery  will be performed.  7.  Thoroughly rinse your body with warm water from the neck down.  8.  DO NOT shower/wash with your normal soap after using and rinsing off  the CHG Soap.                9.  Pat yourself dry with a clean towel.            10.  Wear clean pajamas.            11.  Place clean sheets on your bed the night of your first shower and do not  sleep with pets. Day of Surgery : Do not apply any lotions/deodorants the morning of surgery.  Please wear clean clothes to the hospital/surgery center.  FAILURE TO FOLLOW THESE INSTRUCTIONS MAY RESULT IN THE CANCELLATION OF YOUR SURGERY PATIENT SIGNATURE_________________________________  NURSE SIGNATURE__________________________________  ________________________________________________________________________  WHAT IS A BLOOD TRANSFUSION? Blood Transfusion Information  A transfusion is the replacement of blood or some of its parts. Blood is made up of multiple cells which provide different functions.  Red blood cells carry oxygen and are used for blood loss replacement.  White blood cells fight against infection.  Platelets control bleeding.  Plasma helps clot blood.  Other blood products are available for specialized needs, such as hemophilia or other clotting disorders. BEFORE THE TRANSFUSION  Who gives blood for transfusions?   Healthy volunteers who are fully evaluated to make sure their blood is safe. This is blood bank blood. Transfusion therapy is the safest it has ever been in the practice of medicine. Before blood is taken from a donor, a complete history is taken to make sure that person has no history of diseases nor  engages in risky social behavior (examples are intravenous drug use or sexual activity with multiple partners). The donor's travel history is screened to minimize risk of transmitting infections, such as malaria. The donated blood is tested for signs of infectious diseases, such as HIV and hepatitis. The blood is then tested to be sure it is compatible with you in order to minimize the chance of a transfusion reaction. If you or a relative donates blood, this is often done in anticipation of surgery and is not appropriate for emergency situations. It takes many days to process the donated blood. RISKS AND COMPLICATIONS Although transfusion therapy is very safe and saves many lives, the main dangers of transfusion include:  1. Getting an infectious disease. 2. Developing a transfusion reaction.  This is an allergic reaction to something in the blood you were given. Every precaution is taken to prevent this. The decision to have a blood transfusion has been considered carefully by your caregiver before blood is given. Blood is not given unless the benefits outweigh the risks. AFTER THE TRANSFUSION  Right after receiving a blood transfusion, you will usually feel much better and more energetic. This is especially true if your red blood cells have gotten low (anemic). The transfusion raises the level of the red blood cells which carry oxygen, and this usually causes an energy increase.  The nurse administering the transfusion will monitor you carefully for complications. HOME CARE INSTRUCTIONS  No special instructions are needed after a transfusion. You may find your energy is better. Speak with your caregiver about any limitations on activity for underlying diseases you may have. SEEK MEDICAL CARE IF:   Your condition is not improving after your transfusion.  You develop redness or irritation at the intravenous (IV) site. SEEK IMMEDIATE MEDICAL CARE IF:  Any of the following symptoms occur over the  next 12 hours:  Shaking chills.  You have a temperature by mouth above 102 F (38.9 C), not controlled by medicine.  Chest, back, or muscle pain.  People around you feel you are not acting correctly or are confused.  Shortness of breath or difficulty breathing.  Dizziness and fainting.  You get a rash or develop hives.  You have a decrease in urine output.  Your urine turns a dark color or changes to pink, red, or brown. Any of the following symptoms occur over the next 10 days:  You have a temperature by mouth above 102 F (38.9 C), not controlled by medicine.  Shortness of breath.  Weakness after normal activity.  The white part of the eye turns yellow (jaundice).  You have a decrease in the amount of urine or are urinating less often.  Your urine turns a dark color or changes to pink, red, or brown. Document Released: 11/20/2000 Document Revised: 02/15/2012 Document Reviewed: 07/09/2008 ExitCare Patient Information 2014 [place].  _______________________________________________________________________  Incentive Spirometer  An incentive spirometer is a tool that can help keep your lungs clear and active. This tool measures how well you are filling your lungs with each breath. Taking long deep breaths may help reverse or decrease the chance of developing breathing (pulmonary) problems (especially infection) following:  A long period of time when you are unable to move or be active. BEFORE THE PROCEDURE   If the spirometer includes an indicator to show your best effort, your nurse or respiratory therapist will set it to a desired goal.  If possible, sit up straight or lean slightly forward. Try not to slouch.  Hold the incentive spirometer in an upright position. INSTRUCTIONS FOR USE  3. Sit on the edge of your bed if possible, or sit up as far as you can in bed or on a chair. 4. Hold the incentive spirometer in an upright position. 5. Breathe out  normally. 6. Place the mouthpiece in your mouth and seal your lips tightly around it. 7. Breathe in slowly and as deeply as possible, raising the piston or the ball toward the top of the column. 8. Hold your breath for 3-5 seconds or for as long as possible. Allow the piston or ball to fall to the bottom of the column. 9. Remove the mouthpiece from your mouth and breathe out normally. 10. Rest for a few seconds and repeat Steps  1 through 7 at least 10 times every 1-2 hours when you are awake. Take your time and take a few normal breaths between deep breaths. 11. The spirometer may include an indicator to show your best effort. Use the indicator as a goal to work toward during each repetition. 12. After each set of 10 deep breaths, practice coughing to be sure your lungs are clear. If you have an incision (the cut made at the time of surgery), support your incision when coughing by placing a pillow or rolled up towels firmly against it. Once you are able to get out of bed, walk around indoors and cough well. You may stop using the incentive spirometer when instructed by your caregiver.  RISKS AND COMPLICATIONS  Take your time so you do not get dizzy or light-headed.  If you are in pain, you may need to take or ask for pain medication before doing incentive spirometry. It is harder to take a deep breath if you are having pain. AFTER USE  Rest and breathe slowly and easily.  It can be helpful to keep track of a log of your progress. Your caregiver can provide you with a simple table to help with this. If you are using the spirometer at home, follow these instructions: [place] IF:   You are having difficultly using the spirometer.  You have trouble using the spirometer as often as instructed.  Your pain medication is not giving enough relief while using the spirometer.  You develop fever of 100.5 F (38.1 C) or higher. SEEK IMMEDIATE MEDICAL CARE IF:   You cough up bloody sputum  that had not been present before.  You develop fever of 102 F (38.9 C) or greater.  You develop worsening pain at or near the incision site. MAKE SURE YOU:   Understand these instructions.  Will watch your condition.  Will get help right away if you are not doing well or get worse. Document Released: 04/05/2007 Document Revised: 02/15/2012 Document Reviewed: 06/06/2007 [facility] Patient Information 2014 [place], [place].   ________________________________________________________________________

## 2016-04-01 ENCOUNTER — Encounter (HOSPITAL_COMMUNITY): Payer: Self-pay

## 2016-04-01 ENCOUNTER — Encounter (HOSPITAL_COMMUNITY)
Admission: RE | Admit: 2016-04-01 | Discharge: 2016-04-01 | Disposition: A | Discharge status: Home / Self Care | Payer: Medicare HMO | Source: Ambulatory Visit | Attending: Orthopedic Surgery | Admitting: Orthopedic Surgery

## 2016-04-01 DIAGNOSIS — M1612 Unilateral primary osteoarthritis, left hip: Secondary | ICD-10-CM | POA: Diagnosis not present

## 2016-04-01 DIAGNOSIS — Z01812 Encounter for preprocedural laboratory examination: Secondary | ICD-10-CM | POA: Insufficient documentation

## 2016-04-01 LAB — SURGICAL PCR SCREEN
MRSA, PCR: NEGATIVE
Staphylococcus aureus: NEGATIVE

## 2016-04-01 LAB — BASIC METABOLIC PANEL
ANION GAP: 10 (ref 5–15)
BUN: 17 mg/dL (ref 6–20)
CALCIUM: 9.8 mg/dL (ref 8.9–10.3)
CHLORIDE: 103 mmol/L (ref 101–111)
CO2: 29 mmol/L (ref 22–32)
Creatinine, Ser: 1.31 mg/dL — ABNORMAL HIGH (ref 0.44–1.00)
GFR calc Af Amer: 50 mL/min — ABNORMAL LOW (ref >60)
GFR calc non Af Amer: 43 mL/min — ABNORMAL LOW (ref >60)
GLUCOSE: 112 mg/dL — AB (ref 65–99)
Potassium: 3.9 mmol/L (ref 3.5–5.1)
Sodium: 142 mmol/L (ref 135–145)

## 2016-04-01 LAB — CBC
HCT: 36.1 % (ref 36.0–46.0)
HEMOGLOBIN: 12.1 g/dL (ref 12.0–15.0)
MCH: 30 pg (ref 26.0–34.0)
MCHC: 33.5 g/dL (ref 30.0–36.0)
MCV: 89.4 fL (ref 78.0–100.0)
Platelets: 165 10*3/uL (ref 150–400)
RBC: 4.04 MIL/uL (ref 3.87–5.11)
RDW: 13.3 % (ref 11.5–15.5)
WBC: 5.8 10*3/uL (ref 4.0–10.5)

## 2016-04-01 LAB — ABO/RH: ABO/RH(D): B POS

## 2016-04-01 NOTE — Progress Notes (Signed)
Clearance- Rowan Blase, PAC-03/26/16 on chart  03/30/16- EKG on chart  03/30/2016- LOV- Dr Hamilton Capri- on chart - Cardiology - clearance in note

## 2016-04-01 NOTE — Progress Notes (Signed)
BMp done 04/01/16 faxed via EPIC to dr Alvan Dame.

## 2016-04-01 NOTE — Progress Notes (Signed)
Called patient back to inform her what to take of her medications ( since pharmacy had to clarify with her after preop appointment) and patient answered phone but was not at home to put on her instruction sheet. I told patient I would call her back in an hour or I would call her in the am of 04/02/16.  Patient voiced understanding.

## 2016-04-03 NOTE — Progress Notes (Signed)
Spoke with patient by phone since medications were not completed by pharmacy at time of preop appointment and instructed her to take Amlodipine ( Norvasc) and Doxazosin ( Cardura) morning of surgery and no other medications am of surgery.  Patient wrote down and voiced understanding. Also reviewed clear liquid instructions with patient from 12 midnite until 0730am of surgery.

## 2016-04-05 NOTE — H&P (Signed)
TOTAL HIP ADMISSION H&P  Patient is admitted for left total hip arthroplasty, anterior approach.  Subjective:  Chief Complaint:    Left hip primary OA / pain  HPI: Nicole Ibarra, 60 y.o. female, has a history of pain and functional disability in the left hip(s) due to arthritis and patient has failed non-surgical conservative treatments for greater than 12 weeks to include NSAID's and/or analgesics, use of assistive devices and activity modification.  Onset of symptoms was gradual starting  years ago with gradually worsening course since that time.The patient noted prior procedures of the hip to include arthroplasty on the right hip(s).  Patient currently rates pain in the left hip at 8 out of 10 with activity. Patient has worsening of pain with activity and weight bearing, trendelenberg gait, pain that interfers with activities of daily living and pain with passive range of motion. Patient has evidence of periarticular osteophytes and joint space narrowing by imaging studies. This condition presents safety issues increasing the risk of falls. There is no current active infection.   Risks, benefits and expectations were discussed with the patient.  Risks including but not limited to the risk of anesthesia, blood clots, nerve damage, blood vessel damage, failure of the prosthesis, infection and up to and including death.  Patient understand the risks, benefits and expectations and wishes to proceed with surgery.   PCP: Collene Mares, PA-C  D/C Plans:      Home with HHPT  Post-op Meds:       No Rx given  Tranexamic Acid:      To be given - IV   Decadron:      Is to be given  FYI:     ASA  Norco    Patient Active Problem List   Diagnosis Date Noted  . OSTEOARTHRITIS, HIP, RIGHT 07/07/2010  . ALLERGIC RHINITIS 08/07/2008  . GERD 08/07/2008  . MORBID OBESITY 06/28/2008  . TOBACCO ABUSE 06/28/2008  . ANEMIA, NORMOCYTIC 03/13/2008  . PROTEINURIA 03/13/2008  . HYPERLIPIDEMIA 01/10/2007  .  ANXIETY 01/10/2007  . DEPRESSION 01/10/2007  . MIGRAINE HEADACHE 01/10/2007  . HYPERTENSION 01/10/2007  . PUD 01/10/2007  . OSTEOARTHRITIS 01/10/2007  . LOW BACK PAIN 01/10/2007   Past Medical History  Diagnosis Date  . Hypertension   . Arthritis   . Depression   . Anxiety     Past Surgical History  Procedure Laterality Date  . Rt hip surgery      No prescriptions prior to admission   No Known Allergies   Social History  Substance Use Topics  . Smoking status: Current Every Day Smoker -- 0.25 packs/day for 42 years  . Smokeless tobacco: Never Used  . Alcohol Use: No    Family History  Problem Relation Age of Onset  . Cancer Father   . Hypertension Brother   . Hypertension Daughter      Review of Systems  Constitutional: Negative.   Eyes: Negative.   Respiratory: Negative.   Cardiovascular: Negative.   Gastrointestinal: Positive for heartburn.  Genitourinary: Negative.   Musculoskeletal: Positive for back pain and joint pain.  Skin: Negative.   Neurological: Positive for headaches.  Endo/Heme/Allergies: Positive for environmental allergies.  Psychiatric/Behavioral: Positive for depression. The patient is nervous/anxious.     Objective:  Physical Exam  Constitutional: She is oriented to person, place, and time. She appears well-developed.  HENT:  Head: Normocephalic.  Eyes: Pupils are equal, round, and reactive to light.  Neck: Neck supple. No JVD present. No  tracheal deviation present. No thyromegaly present.  Cardiovascular: Normal rate, regular rhythm, normal heart sounds and intact distal pulses.   Respiratory: Effort normal and breath sounds normal. No stridor. No respiratory distress. She has no wheezes.  GI: Soft. There is no tenderness. There is no guarding.  Musculoskeletal:       Left hip: She exhibits decreased range of motion, decreased strength, tenderness and bony tenderness. She exhibits no swelling, no deformity and no laceration.   Lymphadenopathy:    She has no cervical adenopathy.  Neurological: She is alert and oriented to person, place, and time.  Skin: Skin is warm and dry.  Psychiatric: She has a normal mood and affect.      Labs:  Estimated body mass index is 36.50 kg/(m^2) as calculated from the following:   Height as of 08/20/15: 5\' 3"  (1.6 m).   Weight as of 08/20/15: 93.441 kg (206 lb).   Imaging Review Plain radiographs demonstrate severe degenerative joint disease of the left hip(s). The bone quality appears to be good for age and reported activity level.  Assessment/Plan:  End stage arthritis, left hip(s)  The patient history, physical examination, clinical judgement of the provider and imaging studies are consistent with end stage degenerative joint disease of the left hip(s) and total hip arthroplasty is deemed medically necessary. The treatment options including medical management, injection therapy, arthroscopy and arthroplasty were discussed at length. The risks and benefits of total hip arthroplasty were presented and reviewed. The risks due to aseptic loosening, infection, stiffness, dislocation/subluxation,  thromboembolic complications and other imponderables were discussed.  The patient acknowledged the explanation, agreed to proceed with the plan and consent was signed. Patient is being admitted for inpatient treatment for surgery, pain control, PT, OT, prophylactic antibiotics, VTE prophylaxis, progressive ambulation and ADL's and discharge planning.The patient is planning to be discharged home with home health services.     Nicole Pugh Karsten Vaughn   PA-C  04/05/2016, 4:05 PM

## 2016-04-07 ENCOUNTER — Encounter (HOSPITAL_COMMUNITY): Payer: Self-pay

## 2016-04-07 ENCOUNTER — Inpatient Hospital Stay (HOSPITAL_COMMUNITY): Payer: Medicare HMO

## 2016-04-07 ENCOUNTER — Inpatient Hospital Stay (HOSPITAL_COMMUNITY)
Admission: RE | Admit: 2016-04-07 | Discharge: 2016-04-08 | DRG: 470 | Disposition: A | Discharge status: Home Health | Payer: Medicare HMO | Source: Ambulatory Visit | Attending: Orthopedic Surgery | Admitting: Orthopedic Surgery

## 2016-04-07 ENCOUNTER — Encounter (HOSPITAL_COMMUNITY)
Admission: RE | Disposition: A | Discharge status: Home Health | Payer: Self-pay | Source: Ambulatory Visit | Attending: Orthopedic Surgery

## 2016-04-07 ENCOUNTER — Inpatient Hospital Stay (HOSPITAL_COMMUNITY): Payer: Medicare HMO | Admitting: Anesthesiology

## 2016-04-07 DIAGNOSIS — I10 Essential (primary) hypertension: Secondary | ICD-10-CM | POA: Diagnosis not present

## 2016-04-07 DIAGNOSIS — Z96642 Presence of left artificial hip joint: Secondary | ICD-10-CM | POA: Diagnosis not present

## 2016-04-07 DIAGNOSIS — E669 Obesity, unspecified: Secondary | ICD-10-CM | POA: Diagnosis present

## 2016-04-07 DIAGNOSIS — M169 Osteoarthritis of hip, unspecified: Secondary | ICD-10-CM | POA: Diagnosis not present

## 2016-04-07 DIAGNOSIS — Z6833 Body mass index (BMI) 33.0-33.9, adult: Secondary | ICD-10-CM | POA: Diagnosis not present

## 2016-04-07 DIAGNOSIS — Z96649 Presence of unspecified artificial hip joint: Secondary | ICD-10-CM

## 2016-04-07 DIAGNOSIS — K219 Gastro-esophageal reflux disease without esophagitis: Secondary | ICD-10-CM | POA: Diagnosis present

## 2016-04-07 DIAGNOSIS — Z471 Aftercare following joint replacement surgery: Secondary | ICD-10-CM | POA: Diagnosis not present

## 2016-04-07 DIAGNOSIS — M25552 Pain in left hip: Secondary | ICD-10-CM | POA: Diagnosis present

## 2016-04-07 DIAGNOSIS — M1612 Unilateral primary osteoarthritis, left hip: Secondary | ICD-10-CM | POA: Diagnosis not present

## 2016-04-07 DIAGNOSIS — F17210 Nicotine dependence, cigarettes, uncomplicated: Secondary | ICD-10-CM | POA: Diagnosis present

## 2016-04-07 DIAGNOSIS — R69 Illness, unspecified: Secondary | ICD-10-CM | POA: Diagnosis not present

## 2016-04-07 DIAGNOSIS — R269 Unspecified abnormalities of gait and mobility: Secondary | ICD-10-CM | POA: Diagnosis not present

## 2016-04-07 HISTORY — PX: TOTAL HIP ARTHROPLASTY: SHX124

## 2016-04-07 LAB — TYPE AND SCREEN
ABO/RH(D): B POS
Antibody Screen: NEGATIVE

## 2016-04-07 SURGERY — ARTHROPLASTY, HIP, TOTAL, ANTERIOR APPROACH
Anesthesia: Spinal | Site: Hip | Laterality: Left

## 2016-04-07 MED ORDER — MIDAZOLAM HCL 2 MG/2ML IJ SOLN
INTRAMUSCULAR | Status: AC
  Filled 2016-04-07: qty 2

## 2016-04-07 MED ORDER — LACTATED RINGERS IV SOLN
INTRAVENOUS | Status: DC
  Administered 2016-04-07 (×2): via INTRAVENOUS

## 2016-04-07 MED ORDER — LOSARTAN POTASSIUM 50 MG PO TABS
50.0000 mg | ORAL_TABLET | Freq: Every day | ORAL | Status: DC
  Administered 2016-04-07 – 2016-04-08 (×2): 50 mg via ORAL
  Filled 2016-04-07 (×2): qty 1

## 2016-04-07 MED ORDER — ALUM & MAG HYDROXIDE-SIMETH 200-200-20 MG/5ML PO SUSP: 30.0000 mL | ORAL | Status: DC | PRN

## 2016-04-07 MED ORDER — CEFAZOLIN SODIUM-DEXTROSE 2-4 GM/100ML-% IV SOLN
INTRAVENOUS | Status: AC
  Filled 2016-04-07: qty 100

## 2016-04-07 MED ORDER — ROCURONIUM BROMIDE 50 MG/5ML IV SOLN
INTRAVENOUS | Status: AC
  Filled 2016-04-07: qty 1

## 2016-04-07 MED ORDER — FENTANYL CITRATE (PF) 100 MCG/2ML IJ SOLN
INTRAMUSCULAR | Status: DC | PRN
  Administered 2016-04-07 (×4): 50 ug via INTRAVENOUS
  Administered 2016-04-07: 100 ug via INTRAVENOUS
  Administered 2016-04-07: 50 ug via INTRAVENOUS

## 2016-04-07 MED ORDER — ESMOLOL HCL 100 MG/10ML IV SOLN
INTRAVENOUS | Status: AC
  Filled 2016-04-07: qty 10

## 2016-04-07 MED ORDER — ESMOLOL HCL 100 MG/10ML IV SOLN
INTRAVENOUS | Status: DC | PRN
  Administered 2016-04-07 (×3): 20 mg via INTRAVENOUS

## 2016-04-07 MED ORDER — FENTANYL CITRATE (PF) 250 MCG/5ML IJ SOLN
INTRAMUSCULAR | Status: AC
  Filled 2016-04-07: qty 5

## 2016-04-07 MED ORDER — EPHEDRINE SULFATE 50 MG/ML IJ SOLN
INTRAMUSCULAR | Status: DC | PRN
  Administered 2016-04-07 (×2): 10 mg via INTRAVENOUS

## 2016-04-07 MED ORDER — METHOCARBAMOL 500 MG PO TABS: 500.0000 mg | ORAL_TABLET | Freq: Four times a day (QID) | ORAL | Status: DC | PRN

## 2016-04-07 MED ORDER — HYDROMORPHONE HCL 1 MG/ML IJ SOLN
0.2500 mg | INTRAMUSCULAR | Status: DC | PRN
  Administered 2016-04-07 (×3): 0.5 mg via INTRAVENOUS

## 2016-04-07 MED ORDER — PROPOFOL 10 MG/ML IV BOLUS
INTRAVENOUS | Status: AC
  Filled 2016-04-07: qty 20

## 2016-04-07 MED ORDER — MAGNESIUM CITRATE PO SOLN: 1.0000 | Freq: Once | ORAL | Status: DC | PRN

## 2016-04-07 MED ORDER — EPHEDRINE SULFATE 50 MG/ML IJ SOLN
INTRAMUSCULAR | Status: AC
  Filled 2016-04-07: qty 1

## 2016-04-07 MED ORDER — ONDANSETRON HCL 4 MG/2ML IJ SOLN
INTRAMUSCULAR | Status: DC | PRN
  Administered 2016-04-07: 4 mg via INTRAVENOUS

## 2016-04-07 MED ORDER — HYDROMORPHONE HCL 1 MG/ML IJ SOLN
0.5000 mg | INTRAMUSCULAR | Status: DC | PRN
  Administered 2016-04-07: 1 mg via INTRAVENOUS
  Filled 2016-04-07: qty 1

## 2016-04-07 MED ORDER — PROPOFOL 10 MG/ML IV BOLUS
INTRAVENOUS | Status: DC | PRN
  Administered 2016-04-07: 150 mg via INTRAVENOUS
  Administered 2016-04-07: 30 mg via INTRAVENOUS

## 2016-04-07 MED ORDER — ROCURONIUM 10MG/ML (10ML) SYRINGE FOR MEDFUSION PUMP - OPTIME
INTRAVENOUS | Status: DC | PRN
  Administered 2016-04-07: 30 mg via INTRAVENOUS
  Administered 2016-04-07: 10 mg via INTRAVENOUS

## 2016-04-07 MED ORDER — MENTHOL 3 MG MT LOZG: 1.0000 | LOZENGE | OROMUCOSAL | Status: DC | PRN

## 2016-04-07 MED ORDER — METOCLOPRAMIDE HCL 10 MG PO TABS: 5.0000 mg | ORAL_TABLET | Freq: Three times a day (TID) | ORAL | Status: DC | PRN

## 2016-04-07 MED ORDER — SUGAMMADEX SODIUM 200 MG/2ML IV SOLN
INTRAVENOUS | Status: DC | PRN
  Administered 2016-04-07: 200 mg via INTRAVENOUS

## 2016-04-07 MED ORDER — ONDANSETRON HCL 4 MG/2ML IJ SOLN
INTRAMUSCULAR | Status: AC
  Filled 2016-04-07: qty 2

## 2016-04-07 MED ORDER — SUGAMMADEX SODIUM 200 MG/2ML IV SOLN
INTRAVENOUS | Status: AC
  Filled 2016-04-07: qty 2

## 2016-04-07 MED ORDER — HYDROCHLOROTHIAZIDE 25 MG PO TABS
25.0000 mg | ORAL_TABLET | Freq: Every day | ORAL | Status: DC
  Administered 2016-04-08: 25 mg via ORAL
  Filled 2016-04-07: qty 1

## 2016-04-07 MED ORDER — ONDANSETRON HCL 4 MG/2ML IJ SOLN
4.0000 mg | Freq: Four times a day (QID) | INTRAMUSCULAR | Status: DC | PRN
Start: 2016-04-07 — End: 2016-04-08
  Administered 2016-04-07: 4 mg via INTRAVENOUS
  Filled 2016-04-07: qty 2

## 2016-04-07 MED ORDER — PHENOL 1.4 % MT LIQD: 1.0000 | OROMUCOSAL | Status: DC | PRN

## 2016-04-07 MED ORDER — DIPHENHYDRAMINE HCL 25 MG PO CAPS: 25.0000 mg | ORAL_CAPSULE | Freq: Four times a day (QID) | ORAL | Status: DC | PRN

## 2016-04-07 MED ORDER — DEXAMETHASONE SODIUM PHOSPHATE 10 MG/ML IJ SOLN
10.0000 mg | Freq: Once | INTRAMUSCULAR | Status: AC
  Administered 2016-04-07: 10 mg via INTRAVENOUS

## 2016-04-07 MED ORDER — MEPERIDINE HCL 50 MG/ML IJ SOLN: 6.2500 mg | INTRAMUSCULAR | Status: DC | PRN

## 2016-04-07 MED ORDER — POLYETHYLENE GLYCOL 3350 17 G PO PACK
17.0000 g | PACK | Freq: Two times a day (BID) | ORAL | Status: DC
  Administered 2016-04-08: 17 g via ORAL

## 2016-04-07 MED ORDER — METHOCARBAMOL 1000 MG/10ML IJ SOLN
500.0000 mg | Freq: Four times a day (QID) | INTRAVENOUS | Status: DC | PRN
  Administered 2016-04-07 (×2): 500 mg via INTRAVENOUS
  Filled 2016-04-07 (×2): qty 5
  Filled 2016-04-07: qty 550
  Filled 2016-04-07: qty 5

## 2016-04-07 MED ORDER — DOCUSATE SODIUM 100 MG PO CAPS
100.0000 mg | ORAL_CAPSULE | Freq: Two times a day (BID) | ORAL | Status: DC
  Administered 2016-04-08: 100 mg via ORAL

## 2016-04-07 MED ORDER — HYDROMORPHONE HCL 1 MG/ML IJ SOLN
INTRAMUSCULAR | Status: DC | PRN
  Administered 2016-04-07 (×4): 0.5 mg via INTRAVENOUS

## 2016-04-07 MED ORDER — PROPOFOL 10 MG/ML IV BOLUS
INTRAVENOUS | Status: AC
  Filled 2016-04-07: qty 40

## 2016-04-07 MED ORDER — FERROUS SULFATE 325 (65 FE) MG PO TABS
325.0000 mg | ORAL_TABLET | Freq: Three times a day (TID) | ORAL | Status: DC
  Administered 2016-04-08: 325 mg via ORAL
  Filled 2016-04-07 (×4): qty 1

## 2016-04-07 MED ORDER — CHLORHEXIDINE GLUCONATE 4 % EX LIQD: 60.0000 mL | Freq: Once | CUTANEOUS | Status: DC

## 2016-04-07 MED ORDER — HYDROMORPHONE HCL 2 MG/ML IJ SOLN
INTRAMUSCULAR | Status: AC
  Filled 2016-04-07: qty 1

## 2016-04-07 MED ORDER — CELECOXIB 200 MG PO CAPS
200.0000 mg | ORAL_CAPSULE | Freq: Two times a day (BID) | ORAL | Status: DC
  Administered 2016-04-08: 200 mg via ORAL
  Filled 2016-04-07 (×2): qty 1

## 2016-04-07 MED ORDER — HYDROCODONE-ACETAMINOPHEN 7.5-325 MG PO TABS
1.0000 | ORAL_TABLET | ORAL | Status: DC
  Administered 2016-04-07 – 2016-04-08 (×4): 1 via ORAL
  Filled 2016-04-07: qty 1
  Filled 2016-04-07: qty 2
  Filled 2016-04-07 (×3): qty 1

## 2016-04-07 MED ORDER — TRANEXAMIC ACID 1000 MG/10ML IV SOLN
1000.0000 mg | Freq: Once | INTRAVENOUS | Status: AC
  Administered 2016-04-07: 1000 mg via INTRAVENOUS
  Filled 2016-04-07: qty 10

## 2016-04-07 MED ORDER — DEXAMETHASONE SODIUM PHOSPHATE 10 MG/ML IJ SOLN
INTRAMUSCULAR | Status: AC
  Filled 2016-04-07: qty 1

## 2016-04-07 MED ORDER — METOCLOPRAMIDE HCL 5 MG/ML IJ SOLN
5.0000 mg | Freq: Three times a day (TID) | INTRAMUSCULAR | Status: DC | PRN
  Administered 2016-04-07: 10 mg via INTRAVENOUS
  Filled 2016-04-07: qty 2

## 2016-04-07 MED ORDER — MIDAZOLAM HCL 5 MG/5ML IJ SOLN
INTRAMUSCULAR | Status: DC | PRN
  Administered 2016-04-07: 2 mg via INTRAVENOUS

## 2016-04-07 MED ORDER — SODIUM CHLORIDE 0.9 % IJ SOLN
INTRAMUSCULAR | Status: AC
  Filled 2016-04-07: qty 10

## 2016-04-07 MED ORDER — ASPIRIN EC 325 MG PO TBEC
325.0000 mg | DELAYED_RELEASE_TABLET | Freq: Two times a day (BID) | ORAL | Status: DC
  Administered 2016-04-08 (×2): 325 mg via ORAL
  Filled 2016-04-07 (×3): qty 1

## 2016-04-07 MED ORDER — CEFAZOLIN SODIUM-DEXTROSE 2-4 GM/100ML-% IV SOLN
2.0000 g | INTRAVENOUS | Status: AC
  Administered 2016-04-07: 2 g via INTRAVENOUS

## 2016-04-07 MED ORDER — DEXAMETHASONE SODIUM PHOSPHATE 10 MG/ML IJ SOLN
10.0000 mg | Freq: Once | INTRAMUSCULAR | Status: AC
  Administered 2016-04-08: 10 mg via INTRAVENOUS
  Filled 2016-04-07: qty 1

## 2016-04-07 MED ORDER — FENTANYL CITRATE (PF) 100 MCG/2ML IJ SOLN
INTRAMUSCULAR | Status: AC
  Filled 2016-04-07: qty 2

## 2016-04-07 MED ORDER — CEFAZOLIN SODIUM-DEXTROSE 2-4 GM/100ML-% IV SOLN
2.0000 g | Freq: Four times a day (QID) | INTRAVENOUS | Status: AC
  Administered 2016-04-07 – 2016-04-08 (×2): 2 g via INTRAVENOUS
  Filled 2016-04-07 (×2): qty 100

## 2016-04-07 MED ORDER — STERILE WATER FOR IRRIGATION IR SOLN
Status: DC | PRN
  Administered 2016-04-07: 2000 mL

## 2016-04-07 MED ORDER — BISACODYL 10 MG RE SUPP: 10.0000 mg | Freq: Every day | RECTAL | Status: DC | PRN

## 2016-04-07 MED ORDER — LACTATED RINGERS IV SOLN: INTRAVENOUS | Status: DC

## 2016-04-07 MED ORDER — HYDROMORPHONE HCL 1 MG/ML IJ SOLN
INTRAMUSCULAR | Status: AC
  Filled 2016-04-07: qty 1

## 2016-04-07 MED ORDER — AMLODIPINE BESYLATE 10 MG PO TABS
10.0000 mg | ORAL_TABLET | Freq: Every day | ORAL | Status: DC
  Administered 2016-04-08: 10 mg via ORAL
  Filled 2016-04-07: qty 1

## 2016-04-07 MED ORDER — SUCCINYLCHOLINE CHLORIDE 20 MG/ML IJ SOLN
INTRAMUSCULAR | Status: DC | PRN
  Administered 2016-04-07: 100 mg via INTRAVENOUS

## 2016-04-07 MED ORDER — SODIUM CHLORIDE 0.9 % IV SOLN
100.0000 mL/h | INTRAVENOUS | Status: DC
  Administered 2016-04-07: 100 mL/h via INTRAVENOUS
  Filled 2016-04-07 (×4): qty 1000

## 2016-04-07 MED ORDER — SODIUM CHLORIDE 0.9 % IR SOLN
Status: DC | PRN
Start: 2016-04-07 — End: 2016-04-07
  Administered 2016-04-07: 1000 mL

## 2016-04-07 MED ORDER — ATORVASTATIN CALCIUM 20 MG PO TABS
20.0000 mg | ORAL_TABLET | Freq: Every day | ORAL | Status: DC
  Filled 2016-04-07 (×2): qty 1

## 2016-04-07 MED ORDER — LIDOCAINE HCL (CARDIAC) 20 MG/ML IV SOLN
INTRAVENOUS | Status: AC
  Filled 2016-04-07: qty 5

## 2016-04-07 MED ORDER — ONDANSETRON HCL 4 MG PO TABS: 4.0000 mg | ORAL_TABLET | Freq: Four times a day (QID) | ORAL | Status: DC | PRN

## 2016-04-07 MED ORDER — DOXAZOSIN MESYLATE 2 MG PO TABS
2.0000 mg | ORAL_TABLET | Freq: Every day | ORAL | Status: DC
  Administered 2016-04-08: 2 mg via ORAL
  Filled 2016-04-07: qty 1

## 2016-04-07 SURGICAL SUPPLY — 32 items
CAPT HIP TOTAL 2 ×2 IMPLANT
CLOTH BEACON ORANGE TIMEOUT ST (SAFETY) ×2 IMPLANT
COVER PERINEAL POST (MISCELLANEOUS) ×2 IMPLANT
DRAPE STERI IOBAN 125X83 (DRAPES) ×2 IMPLANT
DRAPE U-SHAPE 47X51 STRL (DRAPES) ×4 IMPLANT
DRSG AQUACEL AG ADV 3.5X10 (GAUZE/BANDAGES/DRESSINGS) ×2 IMPLANT
DURAPREP 26ML APPLICATOR (WOUND CARE) ×2 IMPLANT
ELECT REM PT RETURN 9FT ADLT (ELECTROSURGICAL) ×2
ELECTRODE REM PT RTRN 9FT ADLT (ELECTROSURGICAL) ×1 IMPLANT
GLOVE BIOGEL M STRL SZ7.5 (GLOVE) ×4 IMPLANT
GLOVE BIOGEL PI IND STRL 7.0 (GLOVE) ×1 IMPLANT
GLOVE BIOGEL PI IND STRL 7.5 (GLOVE) ×5 IMPLANT
GLOVE BIOGEL PI INDICATOR 7.0 (GLOVE) ×1
GLOVE BIOGEL PI INDICATOR 7.5 (GLOVE) ×5
GLOVE ORTHO TXT STRL SZ7.5 (GLOVE) ×4 IMPLANT
GLOVE SURG SS PI 7.0 STRL IVOR (GLOVE) ×2 IMPLANT
GLOVE SURG SS PI 7.5 STRL IVOR (GLOVE) ×2 IMPLANT
GOWN SPEC L3 XXLG W/TWL (GOWN DISPOSABLE) ×2 IMPLANT
GOWN STRL REUS W/TWL LRG LVL3 (GOWN DISPOSABLE) ×2 IMPLANT
GOWN STRL REUS W/TWL XL LVL3 (GOWN DISPOSABLE) ×4 IMPLANT
HOLDER FOLEY CATH W/STRAP (MISCELLANEOUS) ×2 IMPLANT
LIQUID BAND (GAUZE/BANDAGES/DRESSINGS) ×2 IMPLANT
PACK ANTERIOR HIP CUSTOM (KITS) ×2 IMPLANT
SAW OSC TIP CART 19.5X105X1.3 (SAW) ×2 IMPLANT
SLEEVE SURGEON STRL (DRAPES) ×2 IMPLANT
SUT MNCRL AB 4-0 PS2 18 (SUTURE) ×2 IMPLANT
SUT VIC AB 1 CT1 36 (SUTURE) ×6 IMPLANT
SUT VIC AB 2-0 CT1 27 (SUTURE) ×2
SUT VIC AB 2-0 CT1 TAPERPNT 27 (SUTURE) ×2 IMPLANT
SUT VLOC 180 0 24IN GS25 (SUTURE) ×2 IMPLANT
TRAY FOLEY W/METER SILVER 14FR (SET/KITS/TRAYS/PACK) ×2 IMPLANT
YANKAUER SUCT BULB TIP 10FT TU (MISCELLANEOUS) ×2 IMPLANT

## 2016-04-07 NOTE — Anesthesia Preprocedure Evaluation (Addendum)
Anesthesia Evaluation  Patient identified by MRN, date of birth, ID band Patient awake    Reviewed: Allergy & Precautions, NPO status , Patient's Chart, lab work & pertinent test results  Airway Mallampati: II  TM Distance: >3 FB Neck ROM: Full    Dental  (+) Teeth Intact   Pulmonary Current Smoker,    breath sounds clear to auscultation       Cardiovascular hypertension, Pt. on medications  Rhythm:Regular Rate:Normal     Neuro/Psych  Headaches, PSYCHIATRIC DISORDERS Anxiety Depression    GI/Hepatic Neg liver ROS, PUD, GERD  ,  Endo/Other  negative endocrine ROS  Renal/GU negative Renal ROS  negative genitourinary   Musculoskeletal  (+) Arthritis ,   Abdominal   Peds negative pediatric ROS (+)  Hematology   Anesthesia Other Findings   Reproductive/Obstetrics negative OB ROS                           Anesthesia Physical Anesthesia Plan  ASA: II  Anesthesia Plan: Spinal   Post-op Pain Management:    Induction: Intravenous  Airway Management Planned: Simple Face Mask  Additional Equipment:   Intra-op Plan:   Post-operative Plan:   Informed Consent: I have reviewed the patients History and Physical, chart, labs and discussed the procedure including the risks, benefits and alternatives for the proposed anesthesia with the patient or authorized representative who has indicated his/her understanding and acceptance.     Plan Discussed with: CRNA  Anesthesia Plan Comments: (Consented for GA. )       Anesthesia Quick Evaluation

## 2016-04-07 NOTE — Interval H&P Note (Signed)
History and Physical Interval Note:  04/07/2016 12:36 PM  Nicole Ibarra  has presented today for surgery, with the diagnosis of left hip osteoarthritis  The various methods of treatment have been discussed with the patient and family. After consideration of risks, benefits and other options for treatment, the patient has consented to  Procedure(s): LEFT TOTAL HIP ARTHROPLASTY ANTERIOR APPROACH (Left) as a surgical intervention .  The patient's history has been reviewed, patient examined, no change in status, stable for surgery.  I have reviewed the patient's chart and labs.  Questions were answered to the patient's satisfaction.     [name]

## 2016-04-07 NOTE — Anesthesia Procedure Notes (Signed)
Procedure Name: Intubation Date/Time: 04/07/2016 2:07 PM Performed by: Maxwell Caul Pre-anesthesia Checklist: Patient identified, Emergency Drugs available, Suction available and Patient being monitored Patient Re-evaluated:Patient Re-evaluated prior to inductionOxygen Delivery Method: Circle System Utilized Preoxygenation: Pre-oxygenation with 100% oxygen Intubation Type: IV induction Ventilation: Mask ventilation without difficulty Laryngoscope Size: Mac and 4 Grade View: Grade II Tube type: Oral Tube size: 7.5 mm Number of attempts: 1 Airway Equipment and Method: Stylet Placement Confirmation: ETT inserted through vocal cords under direct vision,  positive ETCO2 and breath sounds checked- equal and bilateral Secured at: 21 cm Tube secured with: Tape Dental Injury: Teeth and Oropharynx as per pre-operative assessment  Comments: After several attempts to place spinal by CRNA and MDA, decision made to proceed with general anesthesia. Patient agrees with plan.

## 2016-04-07 NOTE — Transfer of Care (Signed)
Immediate Anesthesia Transfer of Care Note  Patient: Nicole Ibarra  Procedure(s) Performed: Procedure(s) with comments: LEFT TOTAL HIP ARTHROPLASTY ANTERIOR APPROACH (Left) - Unsuccessful for Spinal, went to General  Patient Location: PACU  Anesthesia Type:General  Level of Consciousness:  sedated, patient cooperative and responds to stimulation  Airway & Oxygen Therapy:Patient Spontanous Breathing and Patient connected to face mask oxgen  Post-op Assessment:  Report given to PACU RN and Post -op Vital signs reviewed and stable  Post vital signs:  Reviewed and stable  Last Vitals:  Filed Vitals:   04/07/16 1124 04/07/16 1615  BP: 157/63   Pulse: 59   Temp: 36.1 C 36.7 C  Resp: 20     Complications: No apparent anesthesia complications

## 2016-04-07 NOTE — Op Note (Signed)
NAME:  Nicole Ibarra NO.: [id number]      MEDICAL RECORD NO.: [id number]      FACILITY:  [facility]      PHYSICIAN:  Paralee Cancel D  DATE OF BIRTH:  October 16, 1956     DATE OF PROCEDURE:  04/07/2016                                 OPERATIVE REPORT         PREOPERATIVE DIAGNOSIS: Left  hip osteoarthritis.      POSTOPERATIVE DIAGNOSIS:  Left hip osteoarthritis.      PROCEDURE:  Left total hip replacement through an anterior approach   utilizing DePuy THR system, component size 49mm pinnacle cup, a size 36+4 neutral   Altrex liner, a size 6Hi Tri Lock stem with a 36+1.5 delta ceramic   ball.      SURGEON:  Pietro Cassis. Alvan Dame, M.D.      ASSISTANT:  Nehemiah Massed, PA-C     ANESTHESIA:  General.      SPECIMENS:  None.      COMPLICATIONS:  None.      BLOOD LOSS:  400 cc     DRAINS:  None      INDICATION OF THE PROCEDURE:  Nicole Ibarra is a 60 y.o. female who had   presented to office for evaluation of left hip pain.  Radiographs revealed   progressive degenerative changes with bone-on-bone   articulation to the  hip joint.  The patient had painful limited range of   motion significantly affecting their overall quality of life.  The patient was failing to    respond to conservative measures, and at this point was ready   to proceed with more definitive measures.  The patient has noted progressive   degenerative changes in his hip, progressive problems and dysfunction   with regarding the hip prior to surgery.  Consent was obtained for   benefit of pain relief.  Specific risk of infection, DVT, component   failure, dislocation, need for revision surgery, as well discussion of   the anterior versus posterior approach were reviewed.  Consent was   obtained for benefit of anterior pain relief through an anterior   approach.      PROCEDURE IN DETAIL:  The patient was brought to operative theater.   Once adequate anesthesia, preoperative  antibiotics, 2gm of Ancef, 1 gm of Tranexamic Acid, and 10 mg of Decadron administered.   The patient was positioned supine on the OSI Hanna table.  Once adequate   padding of boney process was carried out, we had predraped out the hip, and  used fluoroscopy to confirm orientation of the pelvis and position.      The left hip was then prepped and draped from proximal iliac crest to   mid thigh with shower curtain technique.      Time-out was performed identifying the patient, planned procedure, and   extremity.     An incision was then made 2 cm distal and lateral to the   anterior superior iliac spine extending over the orientation of the   tensor fascia lata muscle and sharp dissection was carried down to the   fascia of the muscle and protractor placed in the soft tissues.      The fascia was then  incised.  The muscle belly was identified and swept   laterally and retractor placed along the superior neck.  Following   cauterization of the circumflex vessels and removing some pericapsular   fat, a second cobra retractor was placed on the inferior neck.  A third   retractor was placed on the anterior acetabulum after elevating the   anterior rectus.  A L-capsulotomy was along the line of the   superior neck to the trochanteric fossa, then extended proximally and   distally.  Tag sutures were placed and the retractors were then placed   intracapsular.  We then identified the trochanteric fossa and   orientation of my neck cut, confirmed this radiographically   and then made a neck osteotomy with the femur on traction.  The femoral   head was removed without difficulty or complication.  Traction was let   off and retractors were placed posterior and anterior around the   acetabulum.      The labrum and foveal tissue were debrided.  I began reaming with a 64mm   reamer and reamed up to 68mm reamer with good bony bed preparation and a 1mm   cup was chosen.  The final 57mm Pinnacle cup  was then impacted under fluoroscopy  to confirm the depth of penetration and orientation with respect to   abduction.  A screw was placed followed by the hole eliminator.  The final   36+4 neutral Altrex liner was impacted with good visualized rim fit.  The cup was positioned anatomically within the acetabular portion of the pelvis.      At this point, the femur was rolled at 80 degrees.  Further capsule was   released off the inferior aspect of the femoral neck.  I then   released the superior capsule proximally.  The hook was placed laterally   along the femur and elevated manually and held in position with the bed   hook.  The leg was then extended and adducted with the leg rolled to 100   degrees of external rotation.  Once the proximal femur was fully   exposed, I used a box osteotome to set orientation.  I then began   broaching with the starting chili pepper broach and passed this by hand and then broached up to 6.  With the 6 broach in place I chose a high offset neck and did  several trial reductions.  The offset was appropriate, leg lengths   appeared to be equal best matched with the +1.5 head ball confirmed radiographically.   Given these findings, I went ahead and dislocated the hip, repositioned all   retractors and positioned the right hip in the extended and abducted position.  The final 6 Hi Tri Lock stem was   chosen and it was impacted down to the level of neck cut.  Based on this   and the trial reduction, a 36+1.5 delta ceramic ball was chosen and   impacted onto a clean and dry trunnion, and the hip was reduced.  The   hip had been irrigated throughout the case again at this point.  I did   reapproximate the superior capsular leaflet to the anterior leaflet   using #1 Vicryl.  The fascia of the   tensor fascia lata muscle was then reapproximated using #1 Vicryl and #0 V-lock sutures.  The   remaining wound was closed with 2-0 Vicryl and running 4-0 Monocryl.   The hip  was cleaned, dried, and  dressed sterilely using Dermabond and   Aquacel dressing.  She was then brought   to recovery room in stable condition tolerating the procedure well.    Nicole Orleans, PA-C was present for the entirety of the case involved from   preoperative positioning, perioperative retractor management, general   facilitation of the case, as well as primary wound closure as assistant.            Pietro Cassis Alvan Dame, M.D.        04/07/2016 3:44 PM

## 2016-04-08 ENCOUNTER — Encounter (HOSPITAL_COMMUNITY): Payer: Self-pay | Admitting: Orthopedic Surgery

## 2016-04-08 LAB — CBC
HCT: 30.5 % — ABNORMAL LOW (ref 36.0–46.0)
Hemoglobin: 10.5 g/dL — ABNORMAL LOW (ref 12.0–15.0)
MCH: 30.4 pg (ref 26.0–34.0)
MCHC: 34.4 g/dL (ref 30.0–36.0)
MCV: 88.4 fL (ref 78.0–100.0)
PLATELETS: 170 10*3/uL (ref 150–400)
RBC: 3.45 MIL/uL — AB (ref 3.87–5.11)
RDW: 13.4 % (ref 11.5–15.5)
WBC: 10.8 10*3/uL — ABNORMAL HIGH (ref 4.0–10.5)

## 2016-04-08 LAB — BASIC METABOLIC PANEL
Anion gap: 9 (ref 5–15)
BUN: 14 mg/dL (ref 6–20)
CALCIUM: 9.2 mg/dL (ref 8.9–10.3)
CO2: 25 mmol/L (ref 22–32)
CREATININE: 1 mg/dL (ref 0.44–1.00)
Chloride: 106 mmol/L (ref 101–111)
GFR, EST NON AFRICAN AMERICAN: 60 mL/min — AB (ref >60)
Glucose, Bld: 174 mg/dL — ABNORMAL HIGH (ref 65–99)
Potassium: 4.1 mmol/L (ref 3.5–5.1)
SODIUM: 140 mmol/L (ref 135–145)

## 2016-04-08 MED ORDER — ASPIRIN 325 MG PO TBEC: 325.0000 mg | DELAYED_RELEASE_TABLET | Freq: Two times a day (BID) | ORAL | Status: AC

## 2016-04-08 MED ORDER — FERROUS SULFATE 325 (65 FE) MG PO TABS: 325.0000 mg | ORAL_TABLET | Freq: Three times a day (TID) | ORAL | Status: DC

## 2016-04-08 MED ORDER — DOCUSATE SODIUM 100 MG PO CAPS: 100.0000 mg | ORAL_CAPSULE | Freq: Two times a day (BID) | ORAL | Status: DC

## 2016-04-08 MED ORDER — METHOCARBAMOL 500 MG PO TABS: 500.0000 mg | ORAL_TABLET | Freq: Four times a day (QID) | ORAL | Status: DC | PRN

## 2016-04-08 MED ORDER — HYDROCODONE-ACETAMINOPHEN 7.5-325 MG PO TABS: 1.0000 | ORAL_TABLET | ORAL | Status: DC | PRN

## 2016-04-08 MED ORDER — POLYETHYLENE GLYCOL 3350 17 G PO PACK: 17.0000 g | PACK | Freq: Two times a day (BID) | ORAL | Status: DC

## 2016-04-08 NOTE — Care Management Note (Addendum)
Case Management Note  Patient Details  Name: Nicole Ibarra MRN: [id number] Date of Birth: Jun 18, 1956  Subjective/Objective:                  LEFT TOTAL HIP ARTHROPLASTY ANTERIOR APPROACH (Left)  Action/Plan: Discharge planning Expected Discharge Date:  04/08/16               Expected Discharge Plan:  [place]  In-House Referral:     Discharge planning Services  CM Consult  Post Acute Care Choice:  Home Health Choice offered to:  Patient  DME Arranged:  3-N-1, Walker rolling DME Agency:  [place]:  PT [place]:  [place]  Status of Service:  Completed, signed off  Medicare Important Message Given:    Date Medicare IM Given:    Medicare IM give by:    Date Additional Medicare IM Given:    Additional Medicare Important Message give by:     If discussed at [place] of Stay Meetings, dates discussed:    Additional Comments: 11:15 CM received callback with quote of 33.20 for copay; pt declines paying copay and states she can get her equipment at home.  No other CM needs were communicated. Cm met with pt in room to offer choice of home health agency.  Pt chooses [facility] to render HHPT.  Pt states she cannot afford rolling walker and 3n1 and denies if too costly.  CM called [facility] rep, Merry Proud to please give estimate.  Referral called to [facility] rep, Santiago Glad.   Dellie Catholic, RN 04/08/2016, 10:38 AM

## 2016-04-08 NOTE — Anesthesia Postprocedure Evaluation (Signed)
Anesthesia Post Note  Patient: Nicole Ibarra  Procedure(s) Performed: Procedure(s) (LRB): LEFT TOTAL HIP ARTHROPLASTY ANTERIOR APPROACH (Left)  Patient location during evaluation: PACU Anesthesia Type: General Level of consciousness: awake and alert Pain management: pain level controlled Vital Signs Assessment: post-procedure vital signs reviewed and stable Respiratory status: spontaneous breathing, nonlabored ventilation, respiratory function stable and patient connected to nasal cannula oxygen Cardiovascular status: blood pressure returned to baseline and stable Postop Assessment: no signs of nausea or vomiting Anesthetic complications: no    Last Vitals:  Filed Vitals:   04/08/16 0523 04/08/16 1020  BP: 143/67 136/61  Pulse: 71 70  Temp: 37.1 C 36.6 C  Resp: 16 17    Last Pain:  Filed Vitals:   04/08/16 1031  PainSc: [place] D Tomeca Helm

## 2016-04-08 NOTE — Progress Notes (Signed)
Occupational Therapy Evaluation Patient Details Name: Nicole Ibarra MRN: [id number] DOB: Dec 18, 1955 Today's Date: 04/08/2016    History of Present Illness 60 yo female s/p L THA-direct anterior 04/07/16.    Clinical Impression   Patient presents to OT with decreased ADL independence and safety. Will benefit from skilled OT to maximize function and to facilitate a safe discharge. OT will follow.    Follow Up Recommendations  No OT follow up;Supervision/Assistance - 24 hour    Equipment Recommendations  3 in 1 bedside comode    Recommendations for Other Services       Precautions / Restrictions Precautions Precautions: Fall Restrictions Weight Bearing Restrictions: No LLE Weight Bearing: Weight bearing as tolerated      Mobility Bed Mobility            General bed mobility comments: NT -- up in recliner  Transfers Overall transfer level: Needs assistance Equipment used: Rolling walker (2 wheeled) Transfers: Sit to/from Stand Sit to Stand: Min guard;From elevated surface             Balance                                            ADL Overall ADL's : Needs assistance/impaired Eating/Feeding: Independent;Sitting   Grooming: Wash/dry hands;Supervision/safety;Standing   Upper Body Bathing: Set up;Sitting   Lower Body Bathing: Minimal assistance;Sit to/from stand   Upper Body Dressing : Set up;Sitting   Lower Body Dressing: Minimal assistance;Sit to/from stand   Toilet Transfer: Min guard;Supervision/safety;Ambulation;BSC;RW   Toileting- Clothing Manipulation and Hygiene: Supervision/safety;Sit to/from Nurse, children's Details (indicate cue type and reason): pt to sponge bathe for now Functional mobility during ADLs: Supervision/safety;Min guard;Rolling walker       Vision     Perception     Praxis      Pertinent Vitals/Pain Pain Assessment: 0-10 Pain Score: 6  Pain Location: L hip with movement Pain  Descriptors / Indicators: Sore Pain Intervention(s): Limited activity within patient's tolerance;Monitored during session;RN gave pain meds during session;Repositioned     Hand Dominance     Extremity/Trunk Assessment Upper Extremity Assessment Upper Extremity Assessment: Overall WFL for tasks assessed   Lower Extremity Assessment Lower Extremity Assessment: Defer to PT evaluation    Cervical / Trunk Assessment Cervical / Trunk Assessment: Normal   Communication Communication Communication: No difficulties   Cognition Arousal/Alertness: Awake/alert Behavior During Therapy: WFL for tasks assessed/performed Overall Cognitive Status: Within Functional Limits for tasks assessed                     General Comments       Exercises       Shoulder Instructions      Home Living Family/patient expects to be discharged to:: Private residence Living Arrangements: Parent Available Help at Discharge: Family Type of Home: Mobile home Home Access: Ramped entrance     Home Layout: One level     Bathroom Shower/Tub: Teacher, Nicole Ibarra years/pre: Standard Bathroom Accessibility: Yes How Accessible: Accessible via walker Home Equipment: [place] - 2 wheels;Shower seat   Additional Comments: not sure if she still has a BSC      Prior Functioning/Environment Level of Independence: Independent             OT Diagnosis: Acute pain   OT Problem List: Decreased strength;Decreased range  of motion;Decreased activity tolerance;Decreased knowledge of use of DME or AE;Pain   OT Treatment/Interventions: Self-care/ADL training;DME and/or AE instruction;Therapeutic activities;Patient/family education    OT Goals(Current goals can be found in the care plan section) Acute Rehab OT Goals Patient Stated Goal: none stated OT Goal Formulation: With patient Time For Goal Achievement: 04/22/16 Potential to Achieve Goals: Good ADL Goals Pt Will Perform Lower Body Bathing:  with supervision;sit to/from stand Pt Will Perform Lower Body Dressing: with supervision;sit to/from stand Pt Will Perform Tub/Shower Transfer: Tub transfer;with min assist;rolling walker;shower seat  OT Frequency: Min 2X/week   Barriers to D/C:            Co-evaluation              End of Session Equipment Utilized During Treatment: Surveyor, mining Communication: Mobility status  Activity Tolerance: Patient tolerated treatment well Patient left: Other (comment) (in care of PT ambulating with RW in hallway)   Time: 1300-1316 OT Time Calculation (min): 16 min Charges:  OT General Charges $OT Visit: 1 Procedure OT Evaluation $OT Eval Low Complexity: 1 Procedure G-Codes:    Nicole Ibarra A Apr 19, 2016, 1:27 PM

## 2016-04-08 NOTE — Progress Notes (Signed)
     Subjective: 1 Day Post-Op Procedure(s) (LRB): LEFT TOTAL HIP ARTHROPLASTY ANTERIOR APPROACH (Left)   Patient reports pain as mild, pain controlled. No events throughout the night.  Ready to be discharged home if she does well with PT and pain stays controlled.  Objective:   VITALS:   Filed Vitals:   04/08/16 0158 04/08/16 0523  BP: 143/62 143/67  Pulse: 70 71  Temp: 97.8 F (36.6 C) 98.7 F (37.1 C)  Resp: 16 16    Dorsiflexion/Plantar flexion intact Incision: dressing C/D/I No cellulitis present Compartment soft  LABS  Recent Labs  04/08/16 0404  HGB 10.5*  HCT 30.5*  WBC 10.8*  PLT 170     Recent Labs  04/08/16 0404  NA 140  K 4.1  BUN 14  CREATININE 1.00  GLUCOSE 174*     Assessment/Plan: 1 Day Post-Op Procedure(s) (LRB): LEFT TOTAL HIP ARTHROPLASTY ANTERIOR APPROACH (Left) Foley cath d/c'ed Advance diet Up with therapy D/C IV fluids Discharge home with home health  Follow up in 2 weeks at [facility]. Follow up with OLIN,Gailene Youkhana D in 2 weeks.  Contact information:  [facility] [address] [place] [id number]    Obese (BMI 30-39.9) Estimated body mass index is 33.95 kg/(m^2) as calculated from the following:   Height as of this encounter: 5\' 5"  (1.651 m).   Weight as of this encounter: 92.534 kg (204 lb). Patient also counseled that weight may inhibit the healing process Patient counseled that losing weight will help with future health issues         West Pugh. Sinahi Knights   PAC  04/08/2016, 9:41 AM

## 2016-04-08 NOTE — Evaluation (Signed)
Physical Therapy Evaluation Patient Details Name: Nicole Ibarra MRN: [id number] DOB: Apr 10, 1956 Today's Date: 04/08/2016   History of Present Illness  60 yo female s/p L THA-direct anterior 04/07/16.   Clinical Impression  On eval, pt required Min assist for mobility-walked ~60 feet with RW. Pt had significant difficulty with hip ROM exercises. Pain better with ambulation. Will have a 2nd session prior to d/c later today.     Follow Up Recommendations Home health PT    Equipment Recommendations  None recommended by PT    Recommendations for Other Services       Precautions / Restrictions Precautions Precautions: Fall Restrictions Weight Bearing Restrictions: No LLE Weight Bearing: Weight bearing as tolerated      Mobility  Bed Mobility Overal bed mobility: Needs Assistance Bed Mobility: Supine to Sit     Supine to sit: Min assist     General bed mobility comments: Assist for L LE  Transfers Overall transfer level: Needs assistance Equipment used: Rolling walker (2 wheeled) Transfers: Sit to/from Stand Sit to Stand: Min guard;From elevated surface         General transfer comment: close guard for safety. VCs safety, hand/LE placement.  Ambulation/Gait Ambulation/Gait assistance: Min guard Ambulation Distance (Feet): 60 Feet Assistive device: Rolling walker (2 wheeled) Gait Pattern/deviations: Step-to pattern;Antalgic     General Gait Details: slow gait speed. close guard for safety  Stairs            Wheelchair Mobility    Modified Rankin (Stroke Patients Only)       Balance                                             Pertinent Vitals/Pain Pain Assessment: 0-10 Pain Score: 5  Pain Location: L hip with activity Pain Descriptors / Indicators: Sore Pain Intervention(s): Monitored during session;Ice applied;Repositioned    Home Living Family/patient expects to be discharged to:: Private residence Living Arrangements:  Parent Available Help at Discharge: Family Type of Home: Mobile home Home Access: Ramped entrance     Galliano: One level Home Equipment: Environmental consultant - 2 wheels;Shower seat Additional Comments: not sure if she still has a BSC    Prior Function Level of Independence: Independent               Hand Dominance        Extremity/Trunk Assessment   Upper Extremity Assessment: Defer to OT evaluation           Lower Extremity Assessment: LLE deficits/detail   LLE Deficits / Details: hip flex 2/5-limited by pain. move ankle well  Cervical / Trunk Assessment: Normal  Communication   Communication: No difficulties  Cognition Arousal/Alertness: Awake/alert Behavior During Therapy: WFL for tasks assessed/performed Overall Cognitive Status: Within Functional Limits for tasks assessed                      General Comments      Exercises Total Joint Exercises Ankle Circles/Pumps: AROM;Both;10 reps;Supine Quad Sets: AROM;Both;10 reps;Supine Heel Slides: AAROM;Left;Supine;5 reps Hip ABduction/ADduction: AAROM;Left;10 reps;Supine      Assessment/Plan    PT Assessment Patient needs continued PT services  PT Diagnosis Difficulty walking   PT Problem List Decreased strength;Decreased range of motion;Decreased activity tolerance;Decreased balance;Decreased knowledge of use of DME;Pain  PT Treatment Interventions DME instruction;Gait training;Functional mobility training;Therapeutic activities;Patient/family education;Balance training;Therapeutic exercise  PT Goals (Current goals can be found in the Care Plan section) Acute Rehab PT Goals Patient Stated Goal: none stated PT Goal Formulation: With patient Time For Goal Achievement: 04/15/16 Potential to Achieve Goals: Good    Frequency 7X/week   Barriers to discharge        Co-evaluation               End of Session Equipment Utilized During Treatment: Gait belt Activity Tolerance: Patient limited by  pain Patient left: in chair;with call bell/phone within reach           Time: 0942-1013 PT Time Calculation (min) (ACUTE ONLY): 31 min   Charges:   PT Evaluation $PT Eval Low Complexity: 1 Procedure PT Treatments $Gait Training: 8-22 mins   PT G Codes:        Weston Anna, MPT Pager: [phone]

## 2016-04-08 NOTE — Progress Notes (Signed)
RN reviewed discharge instructions with patient and family. All questions answered.   Paperwork and prescriptions given.   NT rolled patient down in wheelchair to family car with all belongings.     

## 2016-04-08 NOTE — Progress Notes (Signed)
Physical Therapy Treatment Patient Details Name: Nicole Ibarra MRN: [id number] DOB: 06/20/1956 Today's Date: 04/08/2016    History of Present Illness 60 yo female s/p L THA-direct anterior 04/07/16.     PT Comments    Progressing with mobility. All education completed.   Follow Up Recommendations  Home health PT     Equipment Recommendations  None recommended by PT    Recommendations for Other Services       Precautions / Restrictions Precautions Precautions: Fall Restrictions Weight Bearing Restrictions: No LLE Weight Bearing: Weight bearing as tolerated    Mobility  Bed Mobility     General bed mobility comments: NT -- up in recliner  Transfers Overall transfer level: Needs assistance Equipment used: Rolling walker (2 wheeled) Transfers: Sit to/from Stand Sit to Stand: Min guard         General transfer comment: close guard for safety. VCs safety, hand/LE placement. Descent somewhat uncontrolled  Ambulation/Gait Ambulation/Gait assistance: Min guard Ambulation Distance (Feet): 100 Feet Assistive device: Rolling walker (2 wheeled) Gait Pattern/deviations: Step-to pattern;Decreased stride length     General Gait Details: slow gait speed. close guard for safety   Stairs            Wheelchair Mobility    Modified Rankin (Stroke Patients Only)       Balance                                    Cognition Arousal/Alertness: Awake/alert Behavior During Therapy: WFL for tasks assessed/performed Overall Cognitive Status: Within Functional Limits for tasks assessed                      Exercises Total Joint Exercises Ankle Circles/Pumps: AROM;Both;10 reps;Supine Quad Sets: AROM;Both;10 reps;Supine Heel Slides: AAROM;Left;Supine;5 reps Hip ABduction/ADduction: AAROM;Left;10 reps;Supine    General Comments        Pertinent Vitals/Pain Pain Assessment: 0-10 Pain Score: 5  Pain Location: L hip/thigh with activity Pain  Descriptors / Indicators: Sore;Tightness Pain Intervention(s): Monitored during session;Repositioned    Home Living Family/patient expects to be discharged to:: Private residence Living Arrangements: Parent Available Help at Discharge: Family Type of Home: Mobile home Home Access: Ramped entrance   Home Layout: One level Home Equipment: Environmental consultant - 2 wheels;Shower seat Additional Comments: not sure if she still has a BSC    Prior Function Level of Independence: Independent          PT Goals (current goals can now be found in the care plan section) Acute Rehab PT Goals Patient Stated Goal: none stated PT Goal Formulation: With patient Time For Goal Achievement: 04/15/16 Potential to Achieve Goals: Good Progress towards PT goals: Progressing toward goals    Frequency  7X/week    PT Plan Current plan remains appropriate    Co-evaluation             End of Session Equipment Utilized During Treatment: Gait belt Activity Tolerance: Patient tolerated treatment well Patient left: in chair;with call bell/phone within reach     Time: 1312-1320 PT Time Calculation (min) (ACUTE ONLY): 8 min  Charges:  $Gait Training: 8-22 mins                    G Codes:      Weston Anna, MPT Pager: [phone]

## 2016-04-08 NOTE — Progress Notes (Signed)
[facility]    [facility] ([facility]) is providing the following services: RW and Commode  If patient discharges after hours, please call [phone].   Linward Headland 04/08/2016, 11:31 AM

## 2016-04-09 DIAGNOSIS — R69 Illness, unspecified: Secondary | ICD-10-CM | POA: Diagnosis not present

## 2016-04-09 DIAGNOSIS — Z720 Tobacco use: Secondary | ICD-10-CM | POA: Diagnosis not present

## 2016-04-09 DIAGNOSIS — E785 Hyperlipidemia, unspecified: Secondary | ICD-10-CM | POA: Diagnosis not present

## 2016-04-09 DIAGNOSIS — D649 Anemia, unspecified: Secondary | ICD-10-CM | POA: Diagnosis not present

## 2016-04-09 DIAGNOSIS — I10 Essential (primary) hypertension: Secondary | ICD-10-CM | POA: Diagnosis not present

## 2016-04-09 DIAGNOSIS — Z7982 Long term (current) use of aspirin: Secondary | ICD-10-CM | POA: Diagnosis not present

## 2016-04-09 DIAGNOSIS — Z96642 Presence of left artificial hip joint: Secondary | ICD-10-CM | POA: Diagnosis not present

## 2016-04-09 DIAGNOSIS — Z471 Aftercare following joint replacement surgery: Secondary | ICD-10-CM | POA: Diagnosis not present

## 2016-04-13 DIAGNOSIS — Z7982 Long term (current) use of aspirin: Secondary | ICD-10-CM | POA: Diagnosis not present

## 2016-04-13 DIAGNOSIS — E785 Hyperlipidemia, unspecified: Secondary | ICD-10-CM | POA: Diagnosis not present

## 2016-04-13 DIAGNOSIS — Z96642 Presence of left artificial hip joint: Secondary | ICD-10-CM | POA: Diagnosis not present

## 2016-04-13 DIAGNOSIS — Z471 Aftercare following joint replacement surgery: Secondary | ICD-10-CM | POA: Diagnosis not present

## 2016-04-13 DIAGNOSIS — Z720 Tobacco use: Secondary | ICD-10-CM | POA: Diagnosis not present

## 2016-04-13 DIAGNOSIS — R69 Illness, unspecified: Secondary | ICD-10-CM | POA: Diagnosis not present

## 2016-04-13 DIAGNOSIS — D649 Anemia, unspecified: Secondary | ICD-10-CM | POA: Diagnosis not present

## 2016-04-13 DIAGNOSIS — I10 Essential (primary) hypertension: Secondary | ICD-10-CM | POA: Diagnosis not present

## 2016-04-13 NOTE — Discharge Summary (Signed)
Physician Discharge Summary  Patient ID: Nicole Ibarra MRN: [id number] DOB/AGE: 02-09-1956 60 y.o.  Admit date: 04/07/2016 Discharge date: 04/08/2016   Procedures:  Procedure(s) (LRB): LEFT TOTAL HIP ARTHROPLASTY ANTERIOR APPROACH (Left)  Attending Physician:  Dr. Paralee Cancel   Admission Diagnoses:   Left hip primary OA / pain  Discharge Diagnoses:  Principal Problem:   S/P left THA, AA  Past Medical History  Diagnosis Date  . Hypertension   . Arthritis   . Depression   . Anxiety     HPI:    Nicole Ibarra, 60 y.o. female, has a history of pain and functional disability in the left hip(s) due to arthritis and patient has failed non-surgical conservative treatments for greater than 12 weeks to include NSAID's and/or analgesics, use of assistive devices and activity modification. Onset of symptoms was gradual starting years ago with gradually worsening course since that time.The patient noted prior procedures of the hip to include arthroplasty on the right hip(s). Patient currently rates pain in the left hip at 8 out of 10 with activity. Patient has worsening of pain with activity and weight bearing, trendelenberg gait, pain that interfers with activities of daily living and pain with passive range of motion. Patient has evidence of periarticular osteophytes and joint space narrowing by imaging studies. This condition presents safety issues increasing the risk of falls.There is no current active infection. Risks, benefits and expectations were discussed with the patient. Risks including but not limited to the risk of anesthesia, blood clots, nerve damage, blood vessel damage, failure of the prosthesis, infection and up to and including death. Patient understand the risks, benefits and expectations and wishes to proceed with surgery.   PCP: Collene Mares, PA-C   Discharged Condition: good  Hospital Course:  Patient underwent the above stated procedure on 04/07/2016. Patient  tolerated the procedure well and brought to the recovery room in good condition and subsequently to the floor.  POD #1 BP: 143/67 ; Pulse: 71 ; Temp: 98.7 F (37.1 C) ; Resp: 16 Patient reports pain as mild, pain controlled. No events throughout the night. Ready to be discharged home. Dorsiflexion/plantar flexion intact, incision: dressing C/D/I, no cellulitis present and compartment soft.   LABS  Basename    HGB     10.5  HCT     30.5    Discharge Exam: General appearance: alert, cooperative and no distress Extremities: Homans sign is negative, no sign of DVT, no edema, redness or tenderness in the calves or thighs and no ulcers, gangrene or trophic changes  Disposition: Home with follow up in 2 weeks   Follow-up Information    Follow up with [name], MD. Schedule an appointment as soon as possible for a visit in 2 weeks.   Specialty:  Orthopedic Surgery   Contact information:   [address] [place] [address] [phone]       Follow up with [facility].   Why:  home health physical therapy   Contact information:   [address] [place] [address] [phone]       Discharge Instructions    Call MD / Call 911    Complete by:  As directed   If you experience chest pain or shortness of breath, CALL 911 and be transported to the hospital emergency room.  If you develope a fever above 101 F, pus (white drainage) or increased drainage or redness at the wound, or calf pain, call your  surgeon's office.     Change dressing    Complete by:  As directed   Maintain surgical dressing until follow up in the clinic. If the edges start to pull up, may reinforce with tape. If the dressing is no longer working, may remove and cover with gauze and tape, but must keep the area dry and clean.  Call with any questions or concerns.     Constipation Prevention    Complete by:  As directed   Drink plenty of fluids.  Prune juice may  be helpful.  You may use a stool softener, such as Colace (over the counter) 100 mg twice a day.  Use MiraLax (over the counter) for constipation as needed.     Diet - low sodium heart healthy    Complete by:  As directed      Discharge instructions    Complete by:  As directed   Maintain surgical dressing until follow up in the clinic. If the edges start to pull up, may reinforce with tape. If the dressing is no longer working, may remove and cover with gauze and tape, but must keep the area dry and clean.  Follow up in 2 weeks at [facility] Call with any questions or concerns.     Increase activity slowly as tolerated    Complete by:  As directed   Weight bearing as tolerated with assist device (walker, cane, etc) as directed, use it as long as suggested by your surgeon or therapist, typically at least 4-6 weeks.     TED hose    Complete by:  As directed   Use stockings (TED hose) for 2 weeks on both leg(s).  You may remove them at night for sleeping.             Medication List    STOP taking these medications        HYDROcodone-acetaminophen 5-325 MG tablet  Commonly known as:  NORCO/VICODIN  Replaced by:  HYDROcodone-acetaminophen 7.5-325 MG tablet      TAKE these medications        amLODipine 10 MG tablet  Commonly known as:  NORVASC  Take 10 mg by mouth daily.     aspirin 325 MG EC tablet  Take 1 tablet (325 mg total) by mouth 2 (two) times daily.     atorvastatin 20 MG tablet  Commonly known as:  LIPITOR  Take 20 mg by mouth at bedtime.     Coenzyme Q-10 200 MG Caps  Take 200 mg by mouth daily.     docusate sodium 100 MG capsule  Commonly known as:  COLACE  Take 1 capsule (100 mg total) by mouth 2 (two) times daily.     doxazosin 2 MG tablet  Commonly known as:  CARDURA  Take 2 mg by mouth daily.     ferrous sulfate 325 (65 FE) MG tablet  Take 1 tablet (325 mg total) by mouth 3 (three) times daily after meals.     hydrochlorothiazide 25 MG  tablet  Commonly known as:  HYDRODIURIL  Take 25 mg by mouth daily.     HYDROcodone-acetaminophen 7.5-325 MG tablet  Commonly known as:  NORCO  Take 1-2 tablets by mouth every 4 (four) hours as needed for moderate pain.     losartan 50 MG tablet  Commonly known as:  COZAAR  Take 50 mg by mouth daily.     methocarbamol 500 MG tablet  Commonly known as:  ROBAXIN  Take 1  tablet (500 mg total) by mouth every 6 (six) hours as needed for muscle spasms.     polyethylene glycol packet  Commonly known as:  MIRALAX / GLYCOLAX  Take 17 g by mouth 2 (two) times daily.         Signed: West Pugh. Deshanta Lady   PA-C  04/13/2016, 2:57 PM

## 2016-04-22 DIAGNOSIS — Z96642 Presence of left artificial hip joint: Secondary | ICD-10-CM | POA: Diagnosis not present

## 2016-04-22 DIAGNOSIS — R69 Illness, unspecified: Secondary | ICD-10-CM | POA: Diagnosis not present

## 2016-04-22 DIAGNOSIS — Z720 Tobacco use: Secondary | ICD-10-CM | POA: Diagnosis not present

## 2016-04-22 DIAGNOSIS — E785 Hyperlipidemia, unspecified: Secondary | ICD-10-CM | POA: Diagnosis not present

## 2016-04-22 DIAGNOSIS — Z7982 Long term (current) use of aspirin: Secondary | ICD-10-CM | POA: Diagnosis not present

## 2016-04-22 DIAGNOSIS — I10 Essential (primary) hypertension: Secondary | ICD-10-CM | POA: Diagnosis not present

## 2016-04-22 DIAGNOSIS — Z471 Aftercare following joint replacement surgery: Secondary | ICD-10-CM | POA: Diagnosis not present

## 2016-04-22 DIAGNOSIS — D649 Anemia, unspecified: Secondary | ICD-10-CM | POA: Diagnosis not present

## 2016-05-20 DIAGNOSIS — Z96642 Presence of left artificial hip joint: Secondary | ICD-10-CM | POA: Diagnosis not present

## 2016-05-20 DIAGNOSIS — M25562 Pain in left knee: Secondary | ICD-10-CM | POA: Diagnosis not present

## 2016-05-20 DIAGNOSIS — Z471 Aftercare following joint replacement surgery: Secondary | ICD-10-CM | POA: Diagnosis not present

## 2016-08-13 DIAGNOSIS — Z1389 Encounter for screening for other disorder: Secondary | ICD-10-CM | POA: Diagnosis not present

## 2016-08-13 DIAGNOSIS — M1610 Unilateral primary osteoarthritis, unspecified hip: Secondary | ICD-10-CM | POA: Diagnosis not present

## 2016-08-13 DIAGNOSIS — E6609 Other obesity due to excess calories: Secondary | ICD-10-CM | POA: Diagnosis not present

## 2016-08-13 DIAGNOSIS — I10 Essential (primary) hypertension: Secondary | ICD-10-CM | POA: Diagnosis not present

## 2016-08-13 DIAGNOSIS — Z6836 Body mass index (BMI) 36.0-36.9, adult: Secondary | ICD-10-CM | POA: Diagnosis not present

## 2016-08-13 DIAGNOSIS — Z0001 Encounter for general adult medical examination with abnormal findings: Secondary | ICD-10-CM | POA: Diagnosis not present

## 2016-08-20 DIAGNOSIS — Z471 Aftercare following joint replacement surgery: Secondary | ICD-10-CM | POA: Diagnosis not present

## 2016-08-20 DIAGNOSIS — Z96642 Presence of left artificial hip joint: Secondary | ICD-10-CM | POA: Diagnosis not present

## 2016-09-17 ENCOUNTER — Telehealth: Payer: Self-pay

## 2016-09-17 NOTE — Telephone Encounter (Signed)
Pt received a triage letter. Please call her at [phone]

## 2016-09-23 ENCOUNTER — Telehealth: Payer: Self-pay

## 2016-09-23 NOTE — Telephone Encounter (Signed)
Gastroenterology Pre-Procedure Review  Request Date: 09/23/2016 Requesting Physician: Delman Cheadle, PA  PATIENT REVIEW QUESTIONS: The patient responded to the following health history questions as indicated:    1. Diabetes Melitis: no 2. Joint replacements in the past 12 months:  May 2017 3. Major health problems in the past 3 months: no 4. Has an artificial valve or MVP: no 5. Has a defibrillator: no 6. Has been advised in past to take antibiotics in advance of a procedure like teeth cleaning: YES  After hip replacement 7. Family history of colon cancer: no  8. Alcohol Use: no 9. History of sleep apnea: no  10. History of coronary artery or other vascular stents placed within the last 12 months: no    MEDICATIONS & ALLERGIES:    Patient reports the following regarding taking any blood thinners:   Plavix? no Aspirin? no Coumadin? no Brilinta? no Xarelto? no Eliquis? no Pradaxa? no Savaysa? no Effient? no  Patient confirms/reports the following medications:  Current Outpatient Prescriptions  Medication Sig Dispense Refill  . amLODipine (NORVASC) 10 MG tablet Take 10 mg by mouth daily.      Marland Kitchen atorvastatin (LIPITOR) 20 MG tablet Take 20 mg by mouth at bedtime.    . Coenzyme Q-10 200 MG CAPS Take 200 mg by mouth daily.    Marland Kitchen doxazosin (CARDURA) 2 MG tablet Take 2 mg by mouth daily.    . hydrochlorothiazide (HYDRODIURIL) 25 MG tablet Take 25 mg by mouth daily.    Marland Kitchen losartan (COZAAR) 50 MG tablet Take 50 mg by mouth daily.    Marland Kitchen docusate sodium (COLACE) 100 MG capsule Take 1 capsule (100 mg total) by mouth 2 (two) times daily. (Patient not taking: Reported on 09/23/2016) 10 capsule 0  . ferrous sulfate 325 (65 FE) MG tablet Take 1 tablet (325 mg total) by mouth 3 (three) times daily after meals. (Patient not taking: Reported on 09/23/2016)  3  . HYDROcodone-acetaminophen (NORCO) 7.5-325 MG tablet Take 1-2 tablets by mouth every 4 (four) hours as needed for moderate pain. (Patient  not taking: Reported on 09/23/2016) 60 tablet 0  . methocarbamol (ROBAXIN) 500 MG tablet Take 1 tablet (500 mg total) by mouth every 6 (six) hours as needed for muscle spasms. (Patient not taking: Reported on 09/23/2016) 40 tablet 0  . polyethylene glycol (MIRALAX / GLYCOLAX) packet Take 17 g by mouth 2 (two) times daily. (Patient not taking: Reported on 09/23/2016) 14 each 0   No current facility-administered medications for this visit.     Patient confirms/reports the following allergies:  No Known Allergies  No orders of the defined types were placed in this encounter.   AUTHORIZATION INFORMATION Primary Insurance:   ID #:   Group #:  Pre-Cert / Auth required: Pre-Cert / Auth #:   Secondary Insurance:  ID #:   Group #:  Pre-Cert / Auth required: Pre-Cert / Auth #:   SCHEDULE INFORMATION: Procedure has been scheduled as follows:  Date:   10/23/2016               Time:  11:30 Am Location: [facility] Short Stay  This Gastroenterology Pre-Precedure Review Form is being routed to the following provider(s): Barney Drain, MD

## 2016-09-23 NOTE — Telephone Encounter (Signed)
See separate triage.  

## 2016-09-24 NOTE — Telephone Encounter (Signed)
FULL LIQUIDS WITH BREAKFAST.  SUPREP SPLIT DOSING. HOLD HYDROCHLOROTHIAZIDE ON THE DAY OF AND DAY PRIOR TO TCS.  Full Liquid Diet A high-calorie, high-protein supplement should be used to meet your nutritional requirements when the full liquid diet is continued for more than 2 or 3 days. If this diet is to be used for an extended period of time (more than 7 days), a multivitamin should be considered.  Breads and Starches  Allowed: None are allowed   Avoid: Any others.    Potatoes/Pasta/Rice  Allowed: ANY ITEM AS A SOUP OR SMALL PLATE OF MASHED POTATOES OR SCRAMBLED EGGS.       Vegetables  Allowed: Strained tomato or vegetable juice. Vegetables pureed in soup.   Avoid: Any others.    Fruit  Allowed: Any strained fruit juices and fruit drinks. Include 1 serving of citrus or vitamin C-enriched fruit juice daily.   Avoid: Any others.  Meat and Meat Substitutes  Allowed: Egg  Avoid: Any meat, fish, or fowl. All cheese.  Milk  Allowed: SOY Milk beverages, including milk shakes and instant breakfast mixes. Smooth yogurt.   Avoid: Any others. Avoid dairy products if not tolerated.    Soups and Combination Foods  Allowed: Broth, strained cream soups. Strained, broth-based soups.   Avoid: Any others.    Desserts and Sweets  Allowed: flavored gelatin, tapioca, ice cream, sherbet, smooth pudding, junket, fruit ices, frozen ice pops, pudding pops, frozen fudge pops, chocolate syrup. Sugar, honey, jelly, syrup.   Avoid: Any others.  Fats and Oils  Allowed: Margarine, butter, cream, sour cream, oils.   Avoid: Any others.  Beverages  Allowed: All.   Avoid: None.  Condiments  Allowed: Iodized salt, pepper, spices, flavorings. Cocoa powder.   Avoid: Any others.    SAMPLE MEAL PLAN Breakfast   cup orange juice.   1 OR 2 EGGS  1 cup milk.   1 cup beverage (coffee or tea).   Cream or sugar, if desired.    Midmorning Snack  2 SCRAMBLED OR HARD  BOILED EGG   Lunch  1 cup cream soup.    cup fruit juice.   1 cup milk.    cup custard.   1 cup beverage (coffee or tea).   Cream or sugar, if desired.    Midafternoon Snack  1 cup milk shake.  Dinner  1 cup cream soup.    cup fruit juice.   1 cup MILK    cup pudding.   1 cup beverage (coffee or tea).   Cream or sugar, if desired.  Evening Snack  1 cup supplement.  To increase calories, add sugar, cream, butter, or margarine if possible. Nutritional supplements will also increase the total calories.

## 2016-09-25 ENCOUNTER — Other Ambulatory Visit: Payer: Self-pay

## 2016-09-25 DIAGNOSIS — Z1211 Encounter for screening for malignant neoplasm of colon: Secondary | ICD-10-CM

## 2016-09-25 MED ORDER — NA SULFATE-K SULFATE-MG SULF 17.5-3.13-1.6 GM/177ML PO SOLN: 1.0000 | ORAL | 0 refills | Status: DC

## 2016-09-25 NOTE — Telephone Encounter (Signed)
Rx sent to the pharmacy and instructions mailed to pt.  

## 2016-10-22 ENCOUNTER — Telehealth: Payer: Self-pay

## 2016-10-22 NOTE — Telephone Encounter (Signed)
Pt's sister called and said the patient got confused about her instructions for the prep.  I spoke to pt and she has started the prep that she is supposed to do at 6:00 pm tonight.  She said she has only done a little of it.  I told her to stop it and complete as directed at 6:00 pm tonight.  Then she is aware to get up early in the morning to complete.  Her sister Caryl Asp is also aware.

## 2016-10-22 NOTE — Telephone Encounter (Signed)
I called [employer] and got automation. PA not required for the screening colonoscopy. Reference # [id number].

## 2016-10-22 NOTE — Telephone Encounter (Signed)
REVIEWED. AGREE. NO ADDITIONAL RECOMMENDATIONS. 

## 2016-10-23 ENCOUNTER — Ambulatory Visit (HOSPITAL_COMMUNITY)
Admission: RE | Admit: 2016-10-23 | Discharge: 2016-10-23 | Disposition: A | Discharge status: Home / Self Care | Payer: Medicare HMO | Source: Ambulatory Visit | Attending: Gastroenterology | Admitting: Gastroenterology

## 2016-10-23 ENCOUNTER — Encounter (HOSPITAL_COMMUNITY)
Admission: RE | Disposition: A | Discharge status: Home / Self Care | Payer: Self-pay | Source: Ambulatory Visit | Attending: Gastroenterology

## 2016-10-23 ENCOUNTER — Encounter (HOSPITAL_COMMUNITY): Payer: Self-pay | Admitting: *Deleted

## 2016-10-23 DIAGNOSIS — F329 Major depressive disorder, single episode, unspecified: Secondary | ICD-10-CM | POA: Diagnosis not present

## 2016-10-23 DIAGNOSIS — M1990 Unspecified osteoarthritis, unspecified site: Secondary | ICD-10-CM | POA: Insufficient documentation

## 2016-10-23 DIAGNOSIS — F419 Anxiety disorder, unspecified: Secondary | ICD-10-CM | POA: Diagnosis not present

## 2016-10-23 DIAGNOSIS — D125 Benign neoplasm of sigmoid colon: Secondary | ICD-10-CM | POA: Diagnosis not present

## 2016-10-23 DIAGNOSIS — Q438 Other specified congenital malformations of intestine: Secondary | ICD-10-CM | POA: Diagnosis not present

## 2016-10-23 DIAGNOSIS — K648 Other hemorrhoids: Secondary | ICD-10-CM | POA: Diagnosis not present

## 2016-10-23 DIAGNOSIS — K644 Residual hemorrhoidal skin tags: Secondary | ICD-10-CM | POA: Diagnosis not present

## 2016-10-23 DIAGNOSIS — Z1212 Encounter for screening for malignant neoplasm of rectum: Secondary | ICD-10-CM

## 2016-10-23 DIAGNOSIS — Z1211 Encounter for screening for malignant neoplasm of colon: Secondary | ICD-10-CM | POA: Diagnosis not present

## 2016-10-23 DIAGNOSIS — Z79899 Other long term (current) drug therapy: Secondary | ICD-10-CM | POA: Insufficient documentation

## 2016-10-23 DIAGNOSIS — F17200 Nicotine dependence, unspecified, uncomplicated: Secondary | ICD-10-CM | POA: Diagnosis not present

## 2016-10-23 DIAGNOSIS — R69 Illness, unspecified: Secondary | ICD-10-CM | POA: Diagnosis not present

## 2016-10-23 DIAGNOSIS — I10 Essential (primary) hypertension: Secondary | ICD-10-CM | POA: Insufficient documentation

## 2016-10-23 HISTORY — PX: POLYPECTOMY: SHX5525

## 2016-10-23 HISTORY — PX: COLONOSCOPY: SHX5424

## 2016-10-23 SURGERY — COLONOSCOPY
Anesthesia: Moderate Sedation

## 2016-10-23 MED ORDER — STERILE WATER FOR IRRIGATION IR SOLN
Status: DC | PRN
  Administered 2016-10-23: 2.5 mL

## 2016-10-23 MED ORDER — MIDAZOLAM HCL 5 MG/5ML IJ SOLN
INTRAMUSCULAR | Status: DC | PRN
  Administered 2016-10-23: 1 mg via INTRAVENOUS
  Administered 2016-10-23 (×2): 2 mg via INTRAVENOUS

## 2016-10-23 MED ORDER — SODIUM CHLORIDE 0.9 % IV SOLN
INTRAVENOUS | Status: DC
  Administered 2016-10-23: 1000 mL via INTRAVENOUS

## 2016-10-23 MED ORDER — MEPERIDINE HCL 100 MG/ML IJ SOLN
INTRAMUSCULAR | Status: DC | PRN
  Administered 2016-10-23 (×3): 25 mg via INTRAVENOUS

## 2016-10-23 MED ORDER — MEPERIDINE HCL 100 MG/ML IJ SOLN
INTRAMUSCULAR | Status: AC
  Filled 2016-10-23: qty 2

## 2016-10-23 MED ORDER — MIDAZOLAM HCL 5 MG/5ML IJ SOLN
INTRAMUSCULAR | Status: AC
  Filled 2016-10-23: qty 10

## 2016-10-23 NOTE — H&P (Signed)
  Primary Care Physician:  Collene Mares, PA-C Primary Gastroenterologist:  Dr. Oneida Alar  Pre-Procedure History & Physical: HPI:  Nicole Ibarra is a 60 y.o. female here for COLON CANCER SCREENING.  Past Medical History:  Diagnosis Date  . Anxiety   . Arthritis   . Depression   . Hypertension     Past Surgical History:  Procedure Laterality Date  . RT HIP SURGERY    . TOTAL HIP ARTHROPLASTY Left 04/07/2016   Procedure: LEFT TOTAL HIP ARTHROPLASTY ANTERIOR APPROACH;  Surgeon: Paralee Cancel, MD;  Location: [facility];  Service: Orthopedics;  Laterality: Left;  Unsuccessful for Spinal, went to General    Prior to Admission medications   Medication Sig Start Date End Date Taking? Authorizing Provider  amLODipine (NORVASC) 10 MG tablet Take 10 mg by mouth daily.     Yes Historical Provider, MD  Coenzyme Q-10 200 MG CAPS Take 200 mg by mouth 2 (two) times a week.    Yes Historical Provider, MD  doxazosin (CARDURA) 2 MG tablet Take 2 mg by mouth every evening.    Yes Historical Provider, MD  hydrochlorothiazide (HYDRODIURIL) 25 MG tablet Take 25 mg by mouth daily.   Yes Historical Provider, MD  HYDROcodone-acetaminophen (NORCO/VICODIN) 5-325 MG tablet Take 1 tablet by mouth every 6 (six) hours as needed for moderate pain.   Yes Historical Provider, MD  losartan (COZAAR) 50 MG tablet Take 50 mg by mouth every evening.    Yes Historical Provider, MD  vitamin B-12 (CYANOCOBALAMIN) 50 MCG tablet Take 50 mcg by mouth daily.   Yes Historical Provider, MD    Allergies as of 09/25/2016  . (No Known Allergies)    Family History  Problem Relation Age of Onset  . Cancer Father   . Hypertension Brother   . Hypertension Daughter     Social History   Social History  . Marital status: Single    Spouse name: N/A  . Number of children: N/A  . Years of education: N/A   Occupational History  . Not on file.   Social History Main Topics  . Smoking status: Current Every Day Smoker    Packs/day:  0.25    Years: 42.00  . Smokeless tobacco: Never Used  . Alcohol use No  . Drug use: No  . Sexual activity: Not on file   Other Topics Concern  . Not on file   Social History Narrative  . No narrative on file    Review of Systems: See HPI, otherwise negative ROS   Physical Exam: BP (!) 163/69   Pulse 60   Temp 98.6 F (37 C) (Oral)   Resp 13   Ht 5\' 3"  (1.6 m)   Wt 210 lb (95.3 kg)   SpO2 100%   BMI 37.20 kg/m  General:   Alert,  pleasant and cooperative in NAD Head:  Normocephalic and atraumatic. Neck:  Supple; Lungs:  Clear throughout to auscultation.    Heart:  Regular rate and rhythm. Abdomen:  Soft, nontender and nondistended. Normal bowel sounds, without guarding, and without rebound.   Neurologic:  Alert and  oriented x4;  grossly normal neurologically.  Impression/Plan:     SCREENING  Plan:  1. TCS TODAY. DISCUSSED PROCEDURE, BENEFITS, & RISKS: < 1% chance of medication reaction, bleeding, perforation, or rupture of spleen/liver.

## 2016-10-23 NOTE — Op Note (Signed)
[facility] Patient Name: Nicole Ibarra Procedure Date: 10/23/2016 12:24 PM MRN: [id number] Date of Birth: Sep 09, 1956 Attending MD: Barney Drain , MD CSN: [id number] Age: 60 Admit Type: Outpatient Procedure:                Colonoscopy WITH SNARE POLYPECTOMY Indications:              Screening for colorectal malignant neoplasm Providers:                Barney Drain, MD, Lurline Del, RN, Isabella Stalling,                            Technician Referring MD:             Collene Mares PA, PA Medicines:                Meperidine 75 mg IV, Midazolam 5 mg IV Complications:            No immediate complications. Estimated Blood Loss:     Estimated blood loss was minimal. Procedure:                Pre-Anesthesia Assessment:                           - Prior to the procedure, a History and Physical                            was performed, and patient medications and                            allergies were reviewed. The patient's tolerance of                            previous anesthesia was also reviewed. The risks                            and benefits of the procedure and the sedation                            options and risks were discussed with the patient.                            All questions were answered, and informed consent                            was obtained. Prior Anticoagulants: The patient has                            taken no previous anticoagulant or antiplatelet                            agents. ASA Grade Assessment: II - A patient with                            mild systemic disease. After reviewing the risks  and benefits, the patient was deemed in                            satisfactory condition to undergo the procedure.                            After obtaining informed consent, the colonoscope                            was passed under direct vision. Throughout the                            procedure, the patient's blood  pressure, pulse, and                            oxygen saturations were monitored continuously. The                            [id number] scope was introduced through                            the anus and advanced to the the cecum, identified                            by appendiceal orifice and ileocecal valve. The                            ileocecal valve, appendiceal orifice, and rectum                            were photographed. The colonoscopy was somewhat                            difficult due to restricted mobility of the colon.                            Successful completion of the procedure was aided by                            straightening and shortening the scope to obtain                            bowel loop reduction and COLOWRAP. The patient                            tolerated the procedure fairly well. The quality of                            the bowel preparation was excellent. Scope In: 12:45:46 PM Scope Out: 1:01:43 PM Scope Withdrawal Time: 0 hours 12 minutes 12 seconds  Total Procedure Duration: 0 hours 15 minutes 57 seconds  Findings:      A 6 mm polyp was found in the mid sigmoid colon. The polyp was sessile.  The polyp was removed with a hot snare. Resection and retrieval were       complete.      The recto-sigmoid colon and sigmoid colon were moderately redundant.      Internal hemorrhoids were found. The hemorrhoids were small.      External hemorrhoids were found. The hemorrhoids were large. Impression:               - One 6 mm polyp in the mid sigmoid colon, removed                            with a hot snare. Resected and retrieved.                           - Redundant colon.                           - Internal hemorrhoids.                           - External hemorrhoids. Moderate Sedation:      Moderate (conscious) sedation was administered by the endoscopy nurse       and supervised by the endoscopist. The following  parameters were       monitored: oxygen saturation, heart rate, blood pressure, and response       to care. Total physician intraservice time was 28 minutes. Recommendation:           - High fiber diet.                           - Continue present medications.                           - Await pathology results.                           - Repeat colonoscopy in 5-10 years for surveillance                            WITH COLOWRAP.                           - Patient has a contact number available for                            emergencies. The signs and symptoms of potential                            delayed complications were discussed with the                            patient. Return to normal activities tomorrow.                            Written discharge instructions were provided to the  patient. Procedure Code(s):        --- Professional ---                           [phone], Colonoscopy, flexible; with removal of                            tumor(s), polyp(s), or other lesion(s) by snare                            technique                           99152, Moderate sedation services provided by the                            same physician or other qualified health care                            professional performing the diagnostic or                            therapeutic service that the sedation supports,                            requiring the presence of an independent trained                            observer to assist in the monitoring of the                            patient's level of consciousness and physiological                            status; initial 15 minutes of intraservice time,                            patient age 20 years or older                           [phone], Moderate sedation services; each additional                            15 minutes intraservice time Diagnosis Code(s):        --- Professional ---                            Z12.11, Encounter for screening for malignant                            neoplasm of colon                           D12.5, Benign neoplasm of sigmoid colon  K64.4, Residual hemorrhoidal skin tags                           K64.8, Other hemorrhoids                           Q43.8, Other specified congenital malformations of                            intestine CPT copyright 2016 [employer]. All rights reserved. The codes documented in this report are preliminary and upon coder review may  be revised to meet current compliance requirements. Barney Drain, MD Barney Drain, MD 10/23/2016 1:16:20 PM This report has been signed electronically. Number of Addenda: 0

## 2016-10-23 NOTE — Discharge Instructions (Signed)
You had one polyp removed. You have SMALL internal AND LARGE EXTERNAL hemorrhoids.   CONTINUE YOUR WEIGHT LOSS EFFORTS. YOUR BODY MASS INDEX IS OVER 30 WHICH MEANS YOU ARE OBESE. OBESITY IS ASSOCIATED WITH AN INCREASE FOR ALL CANCERS, INCLUDING esophageal, breast, AND COLON CANCER.  DRINK WATER TO KEEP YOUR URINE LIGHT YELLOW.  FOLLOW A HIGH FIBER DIET. AVOID ITEMS THAT CAUSE BLOATING & GAS. SEE INFO BELOW.  YOUR BIOPSY RESULTS WILL BE AVAILABLE IN MY CHART AFTER NOV 21 AND MY OFFICE WILL CONTACT YOU IN 10-14 DAYS WITH YOUR RESULTS.   Next colonoscopy in 5-10 years.   Colonoscopy Care After Read the instructions outlined below and refer to this sheet in the next week. These discharge instructions provide you with general information on caring for yourself after you leave the hospital. While your treatment has been planned according to the most current medical practices available, unavoidable complications occasionally occur. If you have any problems or questions after discharge, call DR. Braeson Rupe, [phone].  ACTIVITY  You may resume your regular activity, but move at a slower pace for the next 24 hours.   Take frequent rest periods for the next 24 hours.   Walking will help get rid of the air and reduce the bloated feeling in your belly (abdomen).   No driving for 24 hours (because of the medicine (anesthesia) used during the test).   You may shower.   Do not sign any important legal documents or operate any machinery for 24 hours (because of the anesthesia used during the test).    NUTRITION  Drink plenty of fluids.   You may resume your normal diet as instructed by your doctor.   Begin with a light meal and progress to your normal diet. Heavy or fried foods are harder to digest and may make you feel sick to your stomach (nauseated).   Avoid alcoholic beverages for 24 hours or as instructed.    MEDICATIONS  You may resume your normal medications.   WHAT YOU CAN  EXPECT TODAY  Some feelings of bloating in the abdomen.   Passage of more gas than usual.   Spotting of blood in your stool or on the toilet paper  .  IF YOU HAD POLYPS REMOVED DURING THE COLONOSCOPY:  Eat a soft diet IF YOU HAVE NAUSEA, BLOATING, ABDOMINAL PAIN, OR VOMITING.    FINDING OUT THE RESULTS OF YOUR TEST Not all test results are available during your visit. DR. Oneida Alar WILL CALL YOU WITHIN 14 DAYS OF YOUR PROCEDUE WITH YOUR RESULTS. Do not assume everything is normal if you have not heard from DR. Rihanna Marseille, CALL HER OFFICE AT [phone].  SEEK IMMEDIATE MEDICAL ATTENTION AND CALL THE OFFICE: [phone] IF:  You have more than a spotting of blood in your stool.   Your belly is swollen (abdominal distention).   You are nauseated or vomiting.   You have a temperature over 101F.   You have abdominal pain or discomfort that is severe or gets worse throughout the day.   High-Fiber Diet A high-fiber diet changes your normal diet to include more whole grains, legumes, fruits, and vegetables. Changes in the diet involve replacing refined carbohydrates with unrefined foods. The calorie level of the diet is essentially unchanged. The Dietary Reference Intake (recommended amount) for adult males is 38 grams per day. For adult females, it is 25 grams per day. Pregnant and lactating women should consume 28 grams of fiber per day. Fiber is the intact part of a  plant that is not broken down during digestion. Functional fiber is fiber that has been isolated from the plant to provide a beneficial effect in the body. PURPOSE  Increase stool bulk.   Ease and regulate bowel movements.   Lower cholesterol.   REDUCE RISK OF COLON CANCER  INDICATIONS THAT YOU NEED MORE FIBER  Constipation and hemorrhoids.   Uncomplicated diverticulosis (intestine condition) and irritable bowel syndrome.   Weight management.   As a protective measure against hardening of the arteries  (atherosclerosis), diabetes, and cancer.   GUIDELINES FOR INCREASING FIBER IN THE DIET  Start adding fiber to the diet slowly. A gradual increase of about 5 more grams (2 slices of whole-wheat bread, 2 servings of most fruits or vegetables, or 1 bowl of high-fiber cereal) per day is best. Too rapid an increase in fiber may result in constipation, flatulence, and bloating.   Drink enough water and fluids to keep your urine clear or pale yellow. Water, juice, or caffeine-free drinks are recommended. Not drinking enough fluid may cause constipation.   Eat a variety of high-fiber foods rather than one type of fiber.   Try to increase your intake of fiber through using high-fiber foods rather than fiber pills or supplements that contain small amounts of fiber.   The goal is to change the types of food eaten. Do not supplement your present diet with high-fiber foods, but replace foods in your present diet.   INCLUDE A VARIETY OF FIBER SOURCES  Replace refined and processed grains with whole grains, canned fruits with fresh fruits, and incorporate other fiber sources. White rice, white breads, and most bakery goods contain little or no fiber.   Brown whole-grain rice, buckwheat oats, and many fruits and vegetables are all good sources of fiber. These include: broccoli, Brussels sprouts, cabbage, cauliflower, beets, sweet potatoes, white potatoes (skin on), carrots, tomatoes, eggplant, squash, berries, fresh fruits, and dried fruits.   Cereals appear to be the richest source of fiber. Cereal fiber is found in whole grains and bran. Bran is the fiber-rich outer coat of cereal grain, which is largely removed in refining. In whole-grain cereals, the bran remains. In breakfast cereals, the largest amount of fiber is found in those with "bran" in their names. The fiber content is sometimes indicated on the label.   You may need to include additional fruits and vegetables each day.   In baking, for 1 cup  white flour, you may use the following substitutions:   1 cup whole-wheat flour minus 2 tablespoons.   1/2 cup white flour plus 1/2 cup whole-wheat flour.   Polyps, Colon  A polyp is extra tissue that grows inside your body. Colon polyps grow in the large intestine. The large intestine, also called the colon, is part of your digestive system. It is a long, hollow tube at the end of your digestive tract where your body makes and stores stool. Most polyps are not dangerous. They are benign. This means they are not cancerous. But over time, some types of polyps can turn into cancer. Polyps that are smaller than a pea are usually not harmful. But larger polyps could someday become or may already be cancerous. To be safe, doctors remove all polyps and test them.   PREVENTION There is not one sure way to prevent polyps. You might be able to lower your risk of getting them if you:  Eat more fruits and vegetables and less fatty food.   Do not smoke.   Avoid  alcohol.   Exercise every day.   Lose weight if you are overweight.   Eating more calcium and folate can also lower your risk of getting polyps. Some foods that are rich in calcium are milk, cheese, and broccoli. Some foods that are rich in folate are chickpeas, kidney beans, and spinach.   Hemorrhoids Hemorrhoids are dilated (enlarged) veins around the rectum. Sometimes clots will form in the veins. This makes them swollen and painful. These are called thrombosed hemorrhoids. Causes of hemorrhoids include:  Constipation.   Straining to have a bowel movement.   HEAVY LIFTING  HOME CARE INSTRUCTIONS  Eat a well balanced diet and drink 6 to 8 glasses of water every day to avoid constipation. You may also use a bulk laxative.   Avoid straining to have bowel movements.   Keep anal area dry and clean.   Do not use a donut shaped pillow or sit on the toilet for long periods. This increases blood pooling and pain.   Move your bowels  when your body has the urge; this will require less straining and will decrease pain and pressure.

## 2016-10-26 ENCOUNTER — Encounter (HOSPITAL_COMMUNITY): Payer: Self-pay | Admitting: Gastroenterology

## 2016-11-03 ENCOUNTER — Telehealth: Payer: Self-pay | Admitting: Gastroenterology

## 2016-11-03 DIAGNOSIS — E782 Mixed hyperlipidemia: Secondary | ICD-10-CM | POA: Diagnosis not present

## 2016-11-03 NOTE — Telephone Encounter (Signed)
Please call pt. She had one simple adenoma removed. FOLLOW A High fiber diet. Next TCS in 5-10 years.

## 2016-11-03 NOTE — Telephone Encounter (Signed)
ON RECALL  °

## 2016-11-03 NOTE — Telephone Encounter (Signed)
Pt is aware.  

## 2017-01-12 DIAGNOSIS — R69 Illness, unspecified: Secondary | ICD-10-CM | POA: Diagnosis not present

## 2017-03-11 DIAGNOSIS — S86911A Strain of unspecified muscle(s) and tendon(s) at lower leg level, right leg, initial encounter: Secondary | ICD-10-CM | POA: Diagnosis not present

## 2017-03-11 DIAGNOSIS — Z6836 Body mass index (BMI) 36.0-36.9, adult: Secondary | ICD-10-CM | POA: Diagnosis not present

## 2017-03-11 DIAGNOSIS — R109 Unspecified abdominal pain: Secondary | ICD-10-CM | POA: Diagnosis not present

## 2017-03-11 DIAGNOSIS — E6609 Other obesity due to excess calories: Secondary | ICD-10-CM | POA: Diagnosis not present

## 2017-04-08 DIAGNOSIS — Z6835 Body mass index (BMI) 35.0-35.9, adult: Secondary | ICD-10-CM | POA: Diagnosis not present

## 2017-04-08 DIAGNOSIS — E6609 Other obesity due to excess calories: Secondary | ICD-10-CM | POA: Diagnosis not present

## 2017-04-08 DIAGNOSIS — E119 Type 2 diabetes mellitus without complications: Secondary | ICD-10-CM | POA: Diagnosis not present

## 2017-04-08 DIAGNOSIS — I10 Essential (primary) hypertension: Secondary | ICD-10-CM | POA: Diagnosis not present

## 2017-04-16 DIAGNOSIS — Z1231 Encounter for screening mammogram for malignant neoplasm of breast: Secondary | ICD-10-CM | POA: Diagnosis not present

## 2017-09-29 DIAGNOSIS — Z6833 Body mass index (BMI) 33.0-33.9, adult: Secondary | ICD-10-CM | POA: Diagnosis not present

## 2017-09-29 DIAGNOSIS — R1030 Lower abdominal pain, unspecified: Secondary | ICD-10-CM | POA: Diagnosis not present

## 2017-09-29 DIAGNOSIS — E119 Type 2 diabetes mellitus without complications: Secondary | ICD-10-CM | POA: Diagnosis not present

## 2017-09-29 DIAGNOSIS — Z0001 Encounter for general adult medical examination with abnormal findings: Secondary | ICD-10-CM | POA: Diagnosis not present

## 2017-09-29 DIAGNOSIS — Z0000 Encounter for general adult medical examination without abnormal findings: Secondary | ICD-10-CM | POA: Diagnosis not present

## 2017-09-29 DIAGNOSIS — I10 Essential (primary) hypertension: Secondary | ICD-10-CM | POA: Diagnosis not present

## 2017-09-29 DIAGNOSIS — Z1389 Encounter for screening for other disorder: Secondary | ICD-10-CM | POA: Diagnosis not present

## 2018-03-09 DIAGNOSIS — Z1389 Encounter for screening for other disorder: Secondary | ICD-10-CM | POA: Diagnosis not present

## 2018-03-09 DIAGNOSIS — I10 Essential (primary) hypertension: Secondary | ICD-10-CM | POA: Diagnosis not present

## 2018-03-09 DIAGNOSIS — Z6832 Body mass index (BMI) 32.0-32.9, adult: Secondary | ICD-10-CM | POA: Diagnosis not present

## 2018-03-09 DIAGNOSIS — E6609 Other obesity due to excess calories: Secondary | ICD-10-CM | POA: Diagnosis not present

## 2018-03-09 DIAGNOSIS — R109 Unspecified abdominal pain: Secondary | ICD-10-CM | POA: Diagnosis not present

## 2018-03-23 DIAGNOSIS — Z6833 Body mass index (BMI) 33.0-33.9, adult: Secondary | ICD-10-CM | POA: Diagnosis not present

## 2018-03-23 DIAGNOSIS — E782 Mixed hyperlipidemia: Secondary | ICD-10-CM | POA: Diagnosis not present

## 2018-03-23 DIAGNOSIS — K219 Gastro-esophageal reflux disease without esophagitis: Secondary | ICD-10-CM | POA: Diagnosis not present

## 2018-03-23 DIAGNOSIS — R109 Unspecified abdominal pain: Secondary | ICD-10-CM | POA: Diagnosis not present

## 2018-03-23 DIAGNOSIS — I10 Essential (primary) hypertension: Secondary | ICD-10-CM | POA: Diagnosis not present

## 2018-05-19 DIAGNOSIS — Z6834 Body mass index (BMI) 34.0-34.9, adult: Secondary | ICD-10-CM | POA: Diagnosis not present

## 2018-05-19 DIAGNOSIS — M7552 Bursitis of left shoulder: Secondary | ICD-10-CM | POA: Diagnosis not present

## 2018-05-19 DIAGNOSIS — M67812 Other specified disorders of synovium, left shoulder: Secondary | ICD-10-CM | POA: Diagnosis not present

## 2018-05-19 DIAGNOSIS — E6609 Other obesity due to excess calories: Secondary | ICD-10-CM | POA: Diagnosis not present

## 2018-08-17 DIAGNOSIS — E785 Hyperlipidemia, unspecified: Secondary | ICD-10-CM | POA: Diagnosis not present

## 2018-08-17 DIAGNOSIS — J449 Chronic obstructive pulmonary disease, unspecified: Secondary | ICD-10-CM | POA: Diagnosis not present

## 2018-08-17 DIAGNOSIS — Z8249 Family history of ischemic heart disease and other diseases of the circulatory system: Secondary | ICD-10-CM | POA: Diagnosis not present

## 2018-08-17 DIAGNOSIS — R69 Illness, unspecified: Secondary | ICD-10-CM | POA: Diagnosis not present

## 2018-08-17 DIAGNOSIS — M1990 Unspecified osteoarthritis, unspecified site: Secondary | ICD-10-CM | POA: Diagnosis not present

## 2018-08-17 DIAGNOSIS — Z96649 Presence of unspecified artificial hip joint: Secondary | ICD-10-CM | POA: Diagnosis not present

## 2018-08-17 DIAGNOSIS — E669 Obesity, unspecified: Secondary | ICD-10-CM | POA: Diagnosis not present

## 2018-08-17 DIAGNOSIS — I10 Essential (primary) hypertension: Secondary | ICD-10-CM | POA: Diagnosis not present

## 2018-08-17 DIAGNOSIS — G8929 Other chronic pain: Secondary | ICD-10-CM | POA: Diagnosis not present

## 2018-08-17 DIAGNOSIS — Z809 Family history of malignant neoplasm, unspecified: Secondary | ICD-10-CM | POA: Diagnosis not present

## 2018-08-18 DIAGNOSIS — Z6836 Body mass index (BMI) 36.0-36.9, adult: Secondary | ICD-10-CM | POA: Diagnosis not present

## 2018-08-18 DIAGNOSIS — I10 Essential (primary) hypertension: Secondary | ICD-10-CM | POA: Diagnosis not present

## 2018-08-18 DIAGNOSIS — E782 Mixed hyperlipidemia: Secondary | ICD-10-CM | POA: Diagnosis not present

## 2018-08-18 DIAGNOSIS — Z1389 Encounter for screening for other disorder: Secondary | ICD-10-CM | POA: Diagnosis not present

## 2018-08-18 DIAGNOSIS — E6609 Other obesity due to excess calories: Secondary | ICD-10-CM | POA: Diagnosis not present

## 2018-09-01 DIAGNOSIS — Z6836 Body mass index (BMI) 36.0-36.9, adult: Secondary | ICD-10-CM | POA: Diagnosis not present

## 2018-09-01 DIAGNOSIS — E6609 Other obesity due to excess calories: Secondary | ICD-10-CM | POA: Diagnosis not present

## 2018-09-01 DIAGNOSIS — I10 Essential (primary) hypertension: Secondary | ICD-10-CM | POA: Diagnosis not present

## 2018-10-03 DIAGNOSIS — Z1389 Encounter for screening for other disorder: Secondary | ICD-10-CM | POA: Diagnosis not present

## 2018-10-03 DIAGNOSIS — E782 Mixed hyperlipidemia: Secondary | ICD-10-CM | POA: Diagnosis not present

## 2018-10-03 DIAGNOSIS — Z6837 Body mass index (BMI) 37.0-37.9, adult: Secondary | ICD-10-CM | POA: Diagnosis not present

## 2018-10-03 DIAGNOSIS — I10 Essential (primary) hypertension: Secondary | ICD-10-CM | POA: Diagnosis not present

## 2018-10-03 DIAGNOSIS — R7309 Other abnormal glucose: Secondary | ICD-10-CM | POA: Diagnosis not present

## 2018-10-03 DIAGNOSIS — Z0000 Encounter for general adult medical examination without abnormal findings: Secondary | ICD-10-CM | POA: Diagnosis not present

## 2018-10-03 DIAGNOSIS — Z0001 Encounter for general adult medical examination with abnormal findings: Secondary | ICD-10-CM | POA: Diagnosis not present

## 2018-12-29 ENCOUNTER — Encounter (HOSPITAL_COMMUNITY): Payer: Self-pay | Admitting: Emergency Medicine

## 2018-12-29 ENCOUNTER — Emergency Department (HOSPITAL_COMMUNITY)
Admission: EM | Admit: 2018-12-29 | Discharge: 2018-12-29 | Disposition: A | Discharge status: Home / Self Care | Payer: Medicare Other | Attending: Emergency Medicine | Admitting: Emergency Medicine

## 2018-12-29 ENCOUNTER — Other Ambulatory Visit: Payer: Self-pay

## 2018-12-29 ENCOUNTER — Emergency Department (HOSPITAL_COMMUNITY): Payer: Medicare Other

## 2018-12-29 DIAGNOSIS — F17200 Nicotine dependence, unspecified, uncomplicated: Secondary | ICD-10-CM | POA: Insufficient documentation

## 2018-12-29 DIAGNOSIS — R1084 Generalized abdominal pain: Secondary | ICD-10-CM | POA: Diagnosis not present

## 2018-12-29 DIAGNOSIS — Z96642 Presence of left artificial hip joint: Secondary | ICD-10-CM | POA: Insufficient documentation

## 2018-12-29 DIAGNOSIS — Z79899 Other long term (current) drug therapy: Secondary | ICD-10-CM | POA: Diagnosis not present

## 2018-12-29 DIAGNOSIS — I10 Essential (primary) hypertension: Secondary | ICD-10-CM | POA: Diagnosis not present

## 2018-12-29 LAB — COMPREHENSIVE METABOLIC PANEL
ALT: 79 U/L — ABNORMAL HIGH (ref 0–44)
AST: 49 U/L — ABNORMAL HIGH (ref 15–41)
Albumin: 4.1 g/dL (ref 3.5–5.0)
Alkaline Phosphatase: 201 U/L — ABNORMAL HIGH (ref 38–126)
Anion gap: 10 (ref 5–15)
BUN: 20 mg/dL (ref 8–23)
CO2: 28 mmol/L (ref 22–32)
Calcium: 10.3 mg/dL (ref 8.9–10.3)
Chloride: 101 mmol/L (ref 98–111)
Creatinine, Ser: 1.18 mg/dL — ABNORMAL HIGH (ref 0.44–1.00)
GFR calc Af Amer: 57 mL/min — ABNORMAL LOW (ref >60)
GFR calc non Af Amer: 49 mL/min — ABNORMAL LOW (ref >60)
Glucose, Bld: 106 mg/dL — ABNORMAL HIGH (ref 70–99)
POTASSIUM: 3.7 mmol/L (ref 3.5–5.1)
SODIUM: 139 mmol/L (ref 135–145)
Total Bilirubin: 0.9 mg/dL (ref 0.3–1.2)
Total Protein: 8.1 g/dL (ref 6.5–8.1)

## 2018-12-29 LAB — CBC
HCT: 37.6 % (ref 36.0–46.0)
Hemoglobin: 12.2 g/dL (ref 12.0–15.0)
MCH: 30.1 pg (ref 26.0–34.0)
MCHC: 32.4 g/dL (ref 30.0–36.0)
MCV: 92.8 fL (ref 80.0–100.0)
Platelets: 181 10*3/uL (ref 150–400)
RBC: 4.05 MIL/uL (ref 3.87–5.11)
RDW: 13.2 % (ref 11.5–15.5)
WBC: 5.3 10*3/uL (ref 4.0–10.5)
nRBC: 0 % (ref 0.0–0.2)

## 2018-12-29 LAB — URINALYSIS, ROUTINE W REFLEX MICROSCOPIC
BILIRUBIN URINE: NEGATIVE
Glucose, UA: NEGATIVE mg/dL
HGB URINE DIPSTICK: NEGATIVE
Ketones, ur: NEGATIVE mg/dL
Leukocytes, UA: NEGATIVE
Nitrite: NEGATIVE
Protein, ur: NEGATIVE mg/dL
Specific Gravity, Urine: 1.014 (ref 1.005–1.030)
pH: 6 (ref 5.0–8.0)

## 2018-12-29 LAB — LIPASE, BLOOD: Lipase: 28 U/L (ref 11–51)

## 2018-12-29 MED ORDER — SODIUM CHLORIDE 0.9 % IV SOLN
Freq: Once | INTRAVENOUS | Status: AC
  Administered 2018-12-29: 21:00:00 via INTRAVENOUS

## 2018-12-29 MED ORDER — IOPAMIDOL (ISOVUE-300) INJECTION 61%
100.0000 mL | Freq: Once | INTRAVENOUS | Status: AC | PRN
  Administered 2018-12-29: 100 mL via INTRAVENOUS

## 2018-12-29 NOTE — ED Triage Notes (Signed)
Pt c/o mid, lower abdominal pain w/o n/v/d or urinary symptoms for over two weeks.

## 2018-12-29 NOTE — ED Provider Notes (Signed)
[facility] EMERGENCY DEPARTMENT Provider Note   CSN: [id number] Arrival date & time: 12/29/18  1629     History   Chief Complaint Chief Complaint  Patient presents with  . Abdominal Pain    HPI Nicole Ibarra is a 63 y.o. female.  HPI Elderly female presents with abdominal pain.  Onset was about 3 weeks ago, since onset it is been persistent, though with waxing, waning severity. Pain is focally in the lower abdomen, midline, with with occasional radiation. There is some associated anorexia, nausea, but no vomiting, no diarrhea. No fever, no chills. Patient has seen her physician, has had reportedly normal urinalysis during this event. Apparently no relief with OTC medication. She denies other generalized complaints including weight loss, acknowledges ongoing energy loss, however.  Past Medical History:  Diagnosis Date  . Anxiety   . Arthritis   . Depression   . Hypertension     Patient Active Problem List   Diagnosis Date Noted  . Special screening for malignant neoplasms, colon   . 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 OSTEOARTHRITIS, HIP, RIGHT 07/07/2010  . ALLERGIC RHINITIS 08/07/2008  . GERD 08/07/2008  . MORBID OBESITY 06/28/2008  . TOBACCO ABUSE 06/28/2008  . ANEMIA, NORMOCYTIC 03/13/2008  . PROTEINURIA 03/13/2008  . HYPERLIPIDEMIA 01/10/2007  . ANXIETY 01/10/2007  . DEPRESSION 01/10/2007  . MIGRAINE HEADACHE 01/10/2007  . HYPERTENSION 01/10/2007  . PUD 01/10/2007  . OSTEOARTHRITIS 01/10/2007  . LOW BACK PAIN 01/10/2007    Past Surgical History:  Procedure Laterality Date  . COLONOSCOPY N/A 10/23/2016   Procedure: COLONOSCOPY;  Surgeon: Danie Binder, MD;  Location: [facility];  Service: Endoscopy;  Laterality: N/A;  11:30 Am  . POLYPECTOMY  10/23/2016   Procedure: POLYPECTOMY;  Surgeon: Danie Binder, MD;  Location: [facility];  Service: Endoscopy;;  sigmoid colon polyp  . RT HIP SURGERY    . TOTAL HIP ARTHROPLASTY Left 04/07/2016   Procedure:  LEFT TOTAL HIP ARTHROPLASTY ANTERIOR APPROACH;  Surgeon: Paralee Cancel, MD;  Location: [facility] OR[facility];  Service: Orthopedics;  Laterality: Left;  Unsuccessful for Spinal, went to General     OB History   No obstetric history on file.      Home Medications    Prior to Admission medications   Medication Sig Start Date End Date Taking? Authorizing Provider  amLODipine (NORVASC) 10 MG tablet Take 10 mg by mouth daily.      [provider]  Coenzyme Q-10 200 MG CAPS Take 200 mg by mouth 2 (two) times a week.     [provider]  doxazosin (CARDURA) 2 MG tablet Take 2 mg by mouth every evening.     [provider]  hydrochlorothiazide (HYDRODIURIL) 25 MG tablet Take 25 mg by mouth daily.    [provider]  HYDROcodone-acetaminophen (NORCO/VICODIN) 5-325 MG tablet Take 1 tablet by mouth every 6 (six) hours as needed for moderate pain.    [provider]  losartan (COZAAR) 50 MG tablet Take 50 mg by mouth every evening.     [provider]  vitamin B-12 (CYANOCOBALAMIN) 50 MCG tablet Take 50 mcg by mouth daily.    [provider]    Family History Family History  Problem Relation Age of Onset  . Cancer Father   . Hypertension Brother   . Hypertension Daughter     Social History Social History   Tobacco Use  . Smoking status: Current Every Day Smoker    Packs/day:  0.25    Years: 42.00    Pack years: 10.50  . Smokeless tobacco: Never Used  Substance Use Topics  . Alcohol use: No  . Drug use: No     Allergies   Patient has no known allergies.   Review of Systems Review of Systems  Constitutional:       Per HPI, otherwise negative  HENT:       Per HPI, otherwise negative  Respiratory:       Per HPI, otherwise negative  Cardiovascular:       Per HPI, otherwise negative  Gastrointestinal: Negative for vomiting.  Endocrine:       Negative aside from HPI  Genitourinary:       Neg aside from HPI   Musculoskeletal:         Per HPI, otherwise negative  Skin: Negative.   Neurological: Negative for syncope.     Physical Exam Updated Vital Signs BP (!) 156/82   Pulse 79   Temp 98 F (36.7 C) (Oral)   Resp 16   Ht 5\' 5"  (1.651 m)   Wt 97.1 kg   SpO2 99%   BMI 35.61 kg/m   Physical Exam Vitals signs and nursing note reviewed.  Constitutional:      General: She is not in acute distress.    Appearance: She is well-developed.  HENT:     Head: Normocephalic and atraumatic.  Eyes:     Conjunctiva/sclera: Conjunctivae normal.  Cardiovascular:     Rate and Rhythm: Normal rate and regular rhythm.  Pulmonary:     Effort: Pulmonary effort is normal. No respiratory distress.     Breath sounds: Normal breath sounds. No stridor.  Abdominal:     General: There is no distension.     Tenderness: There is abdominal tenderness in the suprapubic area.  Skin:    General: Skin is warm and dry.  Neurological:     Mental Status: She is alert and oriented to person, place, and time.     Cranial Nerves: No cranial nerve deficit.      ED Treatments / Results  Labs (all labs ordered are listed, but only abnormal results are displayed) Labs Reviewed  COMPREHENSIVE METABOLIC PANEL - Abnormal; Notable for the following components:      Result Value   Glucose, Bld 106 (*)    Creatinine, Ser 1.18 (*)    AST 49 (*)    ALT 79 (*)    Alkaline Phosphatase 201 (*)    GFR calc non Af Amer 49 (*)    GFR calc Af Amer 57 (*)    All other components within normal limits  URINALYSIS, ROUTINE W REFLEX MICROSCOPIC - Abnormal; Notable for the following components:   APPearance HAZY (*)    All other components within normal limits  LIPASE, BLOOD  CBC    Radiology Ct Abdomen Pelvis W Contrast  Result Date: 12/29/2018 CLINICAL DATA:  63 year old female with abdominal pain. Concern for diverticulitis. EXAM: CT ABDOMEN AND PELVIS WITH CONTRAST TECHNIQUE: Multidetector CT imaging of the abdomen and pelvis was  performed using the standard protocol following bolus administration of intravenous contrast. CONTRAST:  187mL ISOVUE-300 IOPAMIDOL (ISOVUE-300) INJECTION 61% COMPARISON:  Abdominal ultrasound dated 08/07/2015. FINDINGS: Lower chest: The visualized lung bases are clear. No intra-abdominal free air or free fluid. Hepatobiliary: There is a somewhat lobulated 2 cm low attenuating lesion in the right lobe of the liver (series 2, image 17) which demonstrates fluid attenuation, possibly a cyst.  A 1.3 cm hypodense lesion adjacent to the gallbladder (series 2, image 25) consistent with metastatic disease. There is diffuse thickening and haziness of the gallbladder with soft tissue mass extending to the inferior liver (segment IV B/V). 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 There is a 1.6 x 3.3 cm infiltrative mass within the liver abutting the gallbladder fundus. There is mild dilatation of the biliary tree. There is a 3.2 x 3.2 cm confluent mass or enlarged lymph node in the porta pedis (series 2, image 23) with soft tissue mass extending superiorly along the main hepatic duct and central biliary trees. Pancreas: Unremarkable. No pancreatic ductal dilatation or surrounding inflammatory changes. Spleen: Normal in size without focal abnormality. Adrenals/Urinary Tract: The adrenal glands are unremarkable. Mild bilateral renal atrophy. There is no hydronephrosis on either side. There is symmetric enhancement and excretion of contrast by both kidneys. The visualized ureters appear unremarkable. The urinary bladder is only partially distended. There is limited evaluation of the bladder due to streak artifact caused by metallic hip arthroplasties. Stomach/Bowel: There is moderate stool throughout the colon. There is no bowel obstruction or active inflammation. Normal appendix.  Vascular/Lymphatic: Moderate aortoiliac atherosclerotic disease. No portal venous gas. Lobulated portal patent mass described above which appears to abut the main portal vein. The SMV, splenic vein, and main portal vein are patent. Reproductive: Myomatous uterus with multiple partially calcified fibroids. Other: None Musculoskeletal: Degenerative changes of the spine. No acute osseous pathology. Bilateral hip arthroplasty. IMPRESSION: 1. 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 Clinical correlation and tissue sampling recommended. 2. Lobulated mass versus metastatic lymph node in the porta pedis. 3. No bowel obstruction or active inflammation. Normal appendix. 4. Myomatous uterus. Electronically Signed   By: Anner Crete M.D.   On: 12/29/2018 21:48    Procedures Procedures (including critical care time)  Medications Ordered in ED Medications  0.9 %  sodium chloride infusion ( Intravenous New Bag/Given 12/29/18 2058)  iopamidol (ISOVUE-300) 61 % injection 100 mL (100 mLs Intravenous Contrast Given 12/29/18 2118)     Initial Impression / Assessment and Plan / ED Course  I have reviewed the triage vital signs and the nursing notes.  Pertinent labs & imaging results that were available during my care of the patient were reviewed by me and considered in my medical decision making (see chart for details).     10:20 PM On repeat exam the patient is awake and alert, in no distress, hemodynamically unremarkable. I discussed the patient CT findings with our oncology team, and arranged for next day follow-up given concern for hepatobiliary malignancy.  This elderly female presents with new abdominal pain, anorexia, nausea, is found to have CT findings concerning for malignancy. Awake and alert, hemodynamically unremarkable, and after next day follow-up was arranged how she was discharged in stable  condition.  Final Clinical Impressions(s) / ED Diagnoses  Abdominal pain, generalized   Carmin Muskrat, MD 12/29/18 2221

## 2018-12-29 NOTE — Discharge Instructions (Addendum)
As discussed, today's CT is finding demonstrates concern for possible cancer. Is very important that you follow-up with our physicians tomorrow. If you do not receive a phone call from the office to schedule a follow-up, please contact the number above.

## 2018-12-30 ENCOUNTER — Encounter (HOSPITAL_COMMUNITY): Payer: Self-pay | Admitting: *Deleted

## 2018-12-30 NOTE — Progress Notes (Signed)
Oncology Navigator Note:  Patient was referred to our office following an emergency room visit here at [facility]. I called patient today to introduce myself and provide information in how I will be involved with her care.  I provided information to her on her first visit and what to expect.  I made sure she was aware of appointment time and directions to the cancer center.  My phone number was given so that she can call me with any questions or concerns.  She voices appreciation and understanding.

## 2019-01-07 DIAGNOSIS — C801 Malignant (primary) neoplasm, unspecified: Secondary | ICD-10-CM

## 2019-01-07 HISTORY — DX: Malignant (primary) neoplasm, unspecified: C80.1

## 2019-01-09 ENCOUNTER — Encounter (HOSPITAL_COMMUNITY): Payer: Self-pay | Admitting: Hematology

## 2019-01-09 ENCOUNTER — Inpatient Hospital Stay (HOSPITAL_COMMUNITY): Payer: Medicare Other | Attending: Hematology | Admitting: Hematology

## 2019-01-09 ENCOUNTER — Other Ambulatory Visit: Payer: Self-pay

## 2019-01-09 ENCOUNTER — Inpatient Hospital Stay (HOSPITAL_COMMUNITY): Payer: Medicare Other

## 2019-01-09 VITALS — BP 157/65 | HR 69 | Temp 98.2°F | Resp 16 | Ht 64.5 in | Wt 213.0 lb

## 2019-01-09 DIAGNOSIS — Z800 Family history of malignant neoplasm of digestive organs: Secondary | ICD-10-CM | POA: Diagnosis not present

## 2019-01-09 DIAGNOSIS — Z8249 Family history of ischemic heart disease and other diseases of the circulatory system: Secondary | ICD-10-CM

## 2019-01-09 DIAGNOSIS — K5900 Constipation, unspecified: Secondary | ICD-10-CM | POA: Diagnosis not present

## 2019-01-09 DIAGNOSIS — R160 Hepatomegaly, not elsewhere classified: Secondary | ICD-10-CM | POA: Insufficient documentation

## 2019-01-09 DIAGNOSIS — M549 Dorsalgia, unspecified: Secondary | ICD-10-CM | POA: Diagnosis not present

## 2019-01-09 DIAGNOSIS — R110 Nausea: Secondary | ICD-10-CM | POA: Diagnosis not present

## 2019-01-09 DIAGNOSIS — Z803 Family history of malignant neoplasm of breast: Secondary | ICD-10-CM | POA: Insufficient documentation

## 2019-01-09 DIAGNOSIS — R109 Unspecified abdominal pain: Secondary | ICD-10-CM | POA: Insufficient documentation

## 2019-01-09 DIAGNOSIS — R740 Nonspecific elevation of levels of transaminase and lactic acid dehydrogenase [LDH]: Secondary | ICD-10-CM

## 2019-01-09 DIAGNOSIS — F17210 Nicotine dependence, cigarettes, uncomplicated: Secondary | ICD-10-CM | POA: Diagnosis not present

## 2019-01-09 DIAGNOSIS — Z79899 Other long term (current) drug therapy: Secondary | ICD-10-CM | POA: Insufficient documentation

## 2019-01-09 NOTE — Patient Instructions (Signed)
[facility] at [facility] Discharge Instructions     Thank you for choosing [facility] at [facility] to provide your oncology and hematology care.  To afford each patient quality time with our provider, please arrive at least 15 minutes before your scheduled appointment time.   If you have a lab appointment with the Cancer Center please come in thru the  Main Entrance and check in at the main information desk  You need to re-schedule your appointment should you arrive 10 or more minutes late.  We strive to give you quality time with our providers, and arriving late affects you and other patients whose appointments are after yours.  Also, if you no show three or more times for appointments you may be dismissed from the clinic at the providers discretion.     Again, thank you for choosing [facility].  Our hope is that these requests will decrease the amount of time that you wait before being seen by our physicians.       _____________________________________________________________  Should you have questions after your visit to [facility], please contact our office at [phone] between the hours of 8:00 a.m. and 4:30 p.m.  Voicemails left after 4:00 p.m. will not be returned until the following business day.  For prescription refill requests, have your pharmacy contact our office and allow 72 hours.    Cancer Center Support Programs:   > Cancer Support Group  2nd Tuesday of the month 1pm-2pm, Journey Room    

## 2019-01-09 NOTE — Assessment & Plan Note (Addendum)
1.  Liver mass: - Presentation with on and off abdominal pain, nausea and constipation for the last 1 year.  Denies any weight loss. - Colonoscopy on 10/23/2016, 6 mm tubular adenoma in the mid sigmoid colon, internal and external hemorrhoids. -Mammogram on 07/03/2015, BI-RADS 1.  Patient smokes 1 pack of cigarettes over 2 weeks since age 63. - Also developed back pain in the last 2 weeks.  When her abdominal pain got worse, she went to the ER on 12/29/2018. -CT scan of the abdomen and pelvis with contrast on 12/29/2018 showed a 1.6 x 3.3 cm infiltrative mass within the liver abutting the gallbladder fundus.  There is mild dilation of the biliary tree.  There is a 3.2 x 3.2 cm confluent mass/enlarged lymph node in the porta hepatis.  No other abnormalities were seen. - Labs on the same day showed elevated AST, ALT and alk phos with normal bilirubin. - I have recommended biopsy of the liver mass.  We will also send a CEA, AFP and CA 19-9 levels.  We will also obtain a PET CT scan for staging purposes. -I will see her back after the biopsy to discuss the results.  2.  Family history: -Brother had colon cancer.  Sister had breast cancer and father had prostate cancer. -Patient lives by herself and does not have any children.  Her sister lives next door.

## 2019-01-09 NOTE — Progress Notes (Signed)
[facility] CONSULT NOTE  Patient Care Team: Ginger Organ as PCP - General (Physician Assistant)  CHIEF COMPLAINTS/PURPOSE OF CONSULTATION: liver and gallbladder mass with lymph node involvement   HISTORY OF PRESENTING ILLNESS:  Nicole Ibarra 63 y.o. female is here because of a liver mass. She reports she has been having constipation, nausea, and abdominal pain off and on for over a year. She went to the Emergency room and that is when they found the masses. The pain is in her lower abdomen and a dull pain. She also recently started having bilateral flank pain. Denies any vomiting or diarrhea. Had not noticed any recent bleeding such as epistaxis, hematuria or hematochezia. Denies recent chest pain on exertion, shortness of breath on minimal exertion, pre-syncopal episodes, or palpitations. Denies any numbness or tingling in hands or feet. Denies any recent fevers, infections, or recent hospitalizations. Patient reports appetite at 75% and energy level at 25%. Her last colonoscopy was in 10/23/2016 and she is not due for 5 years. She denies any history of any previous cancer. She smokes 1 pack per 2 weeks since she was 63 years old. She denies any alcohol.  She has a family history of her father had prostate cancer. Her brother had colon cancer. Her sister had breast cancer.  She lives alone and has plenty of family support. She lives beside her sister. She is full functioning and performs all her own ADLs and activities. She handle her own finances. 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   MEDICAL HISTORY:  Past Medical History:  Diagnosis Date  . Anxiety   . Arthritis   . Depression   . Hypertension     SURGICAL HISTORY: Past Surgical History:  Procedure Laterality Date  . COLONOSCOPY N/A 10/23/2016   Procedure: COLONOSCOPY;  Surgeon: Danie Binder, MD;  Location: [facility];  Service: Endoscopy;  Laterality: N/A;  11:30 Am  . POLYPECTOMY   10/23/2016   Procedure: POLYPECTOMY;  Surgeon: Danie Binder, MD;  Location: [facility];  Service: Endoscopy;;  sigmoid colon polyp  . RT HIP SURGERY    . TOTAL HIP ARTHROPLASTY Left 04/07/2016   Procedure: LEFT TOTAL HIP ARTHROPLASTY ANTERIOR APPROACH;  Surgeon: Paralee Cancel, MD;  Location: [facility];  Service: Orthopedics;  Laterality: Left;  Unsuccessful for Spinal, went to General    SOCIAL HISTORY: Social History   Socioeconomic History  . Marital status: Single    Spouse name: Not on file  . Number of children: Not on file  . Years of education: Not on file  . Highest education level: Not on file  Occupational History  . Not on file  Social Needs  . Financial resource strain: Not hard at all  . Food insecurity:    Worry: Never true    Inability: Never true  . Transportation needs:    Medical: No    Non-medical: No  Tobacco Use  . Smoking status: Current Every Day Smoker    Packs/day: 0.25    Years: 42.00    Pack years: 10.50  . Smokeless tobacco: Never Used  Substance and Sexual Activity  . Alcohol use: No  . Drug use: No  . Sexual activity: Not Currently  Lifestyle  . Physical activity:    Days per week: 0 days    Minutes per session: 0 min  . Stress: Only a little  Relationships  . Social connections:  Talks on phone: Three times a week    Gets together: Three times a week    Attends religious service: More than 4 times per year    Active member of club or organization: Yes    Attends meetings of clubs or organizations: More than 4 times per year    Relationship status: Never married  . Intimate partner violence:    Fear of current or ex partner: No    Emotionally abused: No    Physically abused: No    Forced sexual activity: No  Other Topics Concern  . Not on file  Social History Narrative  . Not on file    FAMILY HISTORY: Family History  Problem Relation Age of Onset  . Hypertension Mother   . Cancer Father   . Hypertension Brother   .  Cancer Brother   . Hypertension Sister   . Cancer Sister     ALLERGIES:  has No Known Allergies.  MEDICATIONS:  Current Outpatient Medications  Medication Sig Dispense Refill  . amLODIPine-Valsartan-HCTZ 10-320-25 MG TABS Take 1 tablet by mouth daily.    Marland Kitchen atorvastatin (LIPITOR) 20 MG tablet Take 20 mg by mouth daily.    Marland Kitchen doxazosin (CARDURA) 2 MG tablet Take 2 mg by mouth every evening.     . vitamin B-12 (CYANOCOBALAMIN) 50 MCG tablet Take 50 mcg by mouth daily.     No current facility-administered medications for this visit.     REVIEW OF SYSTEMS:   Constitutional: Denies fevers, chills or abnormal night sweats Eyes: Denies blurriness of vision, double vision or watery eyes Ears, nose, mouth, throat, and face: Denies mucositis or sore throat Respiratory: Denies cough, dyspnea or wheezes Cardiovascular: Denies palpitation, chest discomfort or lower extremity swelling Gastrointestinal:  +nausea, +constipation Skin: Denies abnormal skin rashes Lymphatics: Denies new lymphadenopathy or easy bruising Neurological:Denies numbness, tingling or new weaknesses Behavioral/Psych: Mood is stable, no new changes  All other systems were reviewed with the patient and are negative.  PHYSICAL EXAMINATION: ECOG PERFORMANCE STATUS: 1 - Symptomatic but completely ambulatory  Vitals:   01/09/19 1300  BP: (!) 157/65  Pulse: 69  Resp: 16  Temp: 98.2 F (36.8 C)  SpO2: 100%   Filed Weights   01/09/19 1300  Weight: 213 lb (96.6 kg)    GENERAL:alert, no distress and comfortable SKIN: skin color, texture, turgor are normal, no rashes or significant lesions EYES: normal, conjunctiva are pink and non-injected, sclera clear OROPHARYNX:no exudate, no erythema and lips, buccal mucosa, and tongue normal  NECK: supple, thyroid normal size, non-tender, without nodularity LYMPH:  no palpable lymphadenopathy in the cervical, axillary or inguinal LUNGS: clear to auscultation and percussion with  normal breathing effort HEART: regular rate & rhythm and no murmurs and no lower extremity edema ABDOMEN:abdomen soft, non-tender and normal bowel sounds Musculoskeletal:no cyanosis of digits and no clubbing  PSYCH: alert & oriented x 3 with fluent speech NEURO: no focal motor/sensory deficits  LABORATORY DATA:  I have reviewed the data as listed Lab Results  Component Value Date   WBC 5.3 12/29/2018   HGB 12.2 12/29/2018   HCT 37.6 12/29/2018   MCV 92.8 12/29/2018   PLT 181 12/29/2018     Chemistry      Component Value Date/Time   NA 139 12/29/2018 1758   K 3.7 12/29/2018 1758   CL 101 12/29/2018 1758   CO2 28 12/29/2018 1758   BUN 20 12/29/2018 1758   CREATININE 1.18 (H) 12/29/2018 1758  Component Value Date/Time   CALCIUM 10.3 12/29/2018 1758   ALKPHOS 201 (H) 12/29/2018 1758   AST 49 (H) 12/29/2018 1758   ALT 79 (H) 12/29/2018 1758   BILITOT 0.9 12/29/2018 1758       RADIOGRAPHIC STUDIES: I have personally reviewed the radiological images as listed and agreed with the findings in the report. Ct Abdomen Pelvis W Contrast  Result Date: 12/29/2018 CLINICAL DATA:  63 year old female with abdominal pain. Concern for diverticulitis. EXAM: CT ABDOMEN AND PELVIS WITH CONTRAST TECHNIQUE: Multidetector CT imaging of the abdomen and pelvis was performed using the standard protocol following bolus administration of intravenous contrast. CONTRAST:  158m ISOVUE-300 IOPAMIDOL (ISOVUE-300) INJECTION 61% COMPARISON:  Abdominal ultrasound dated 08/07/2015. FINDINGS: Lower chest: The visualized lung bases are clear. No intra-abdominal free air or free fluid. Hepatobiliary: There is a somewhat lobulated 2 cm low attenuating lesion in the right lobe of the liver (series 2, image 337) which demonstrates fluid attenuation, possibly a cyst. A 1.3 cm hypodense lesion adjacent to the gallbladder (series 2, image 25) consistent with metastatic disease. There is diffuse thickening and haziness  of the gallbladder with soft tissue mass extending to the inferior liver (segment IV B/V). 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 There is a 1.6 x 3.3 cm infiltrative mass within the liver abutting the gallbladder fundus. There is mild dilatation of the biliary tree. There is a 3.2 x 3.2 cm confluent mass or enlarged lymph node in the porta pedis (series 2, image 23) with soft tissue mass extending superiorly along the main hepatic duct and central biliary trees. Pancreas: Unremarkable. No pancreatic ductal dilatation or surrounding inflammatory changes. Spleen: Normal in size without focal abnormality. Adrenals/Urinary Tract: The adrenal glands are unremarkable. Mild bilateral renal atrophy. There is no hydronephrosis on either side. There is symmetric enhancement and excretion of contrast by both kidneys. The visualized ureters appear unremarkable. The urinary bladder is only partially distended. There is limited evaluation of the bladder due to streak artifact caused by metallic hip arthroplasties. Stomach/Bowel: There is moderate stool throughout the colon. There is no bowel obstruction or active inflammation. Normal appendix. Vascular/Lymphatic: Moderate aortoiliac atherosclerotic disease. No portal venous gas. Lobulated portal patent mass described above which appears to abut the main portal vein. The SMV, splenic vein, and main portal vein are patent. Reproductive: Myomatous uterus with multiple partially calcified fibroids. Other: None Musculoskeletal: Degenerative changes of the spine. No acute osseous pathology. Bilateral hip arthroplasty. IMPRESSION: 1. 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 Clinical correlation and tissue sampling recommended. 2. Lobulated mass versus  metastatic lymph node in the porta pedis. 3. No bowel obstruction or active inflammation. Normal appendix. 4. Myomatous uterus. Electronically Signed   By: AAnner CreteM.D.   On: 12/29/2018 21:48    ASSESSMENT & PLAN:  Liver mass 1.  Liver mass: - Presentation with on and off abdominal pain, nausea and constipation for the last 1 year.  Denies any weight loss. - Colonoscopy on 10/23/2016, 6 mm tubular adenoma in the mid sigmoid colon, internal and external hemorrhoids. -Mammogram on 07/03/2015, BI-RADS 1.  Patient smokes 1 pack of cigarettes over 2 weeks since age 89 - Also developed back pain in the last 2 weeks.  When her abdominal pain got worse, she went to the ER on 12/29/2018. -CT scan of the abdomen  and pelvis with contrast on 12/29/2018 showed a 1.6 x 3.3 cm infiltrative mass within the liver abutting the gallbladder fundus.  There is mild dilation of the biliary tree.  There is a 3.2 x 3.2 cm confluent mass/enlarged lymph node in the porta hepatis.  No other abnormalities were seen. - Labs on the same day showed elevated AST, ALT and alk phos with normal bilirubin. - I have recommended biopsy of the liver mass.  We will also send a CEA, AFP and CA 19-9 levels.  We will also obtain a PET CT scan for staging purposes. -I will see her back after the biopsy to discuss the results.  2.  Family history: -Brother had colon cancer.  Sister had breast cancer and father had prostate cancer. -Patient lives by herself and does not have any children.  Her sister lives next door.  Orders Placed This Encounter  Procedures  . NM PET Image Initial (PI) Skull Base To Thigh    Standing Status:   Future    Standing Expiration Date:   01/09/2020    Order Specific Question:   ** REASON FOR EXAM (FREE TEXT)    Answer:   biliary mass with liver mass    Order Specific Question:   If indicated for the ordered procedure, I authorize the administration of a radiopharmaceutical per Radiology protocol     Answer:   Yes    Order Specific Question:   Preferred imaging location?    Answer:   [facility]    Order Specific Question:   Radiology Contrast Protocol - do NOT remove file path    Answer:   \\[email or web address]  . CT Biopsy    Standing Status:   Future    Standing Expiration Date:   01/09/2020    Order Specific Question:   Lab orders requested (DO NOT place separate lab orders, these will be automatically ordered during procedure specimen collection):    Answer:   Surgical Pathology    Order Specific Question:   Reason for Exam (SYMPTOM  OR DIAGNOSIS REQUIRED)    Answer:   biliary mass, liver mass    Order Specific Question:   Preferred imaging location?    Answer:   [facility]    Order Specific Question:   Radiology Contrast Protocol - do NOT remove file path    Answer:   \\[email or web address].pdf  . CEA    Standing Status:   Future    Number of Occurrences:   1    Standing Expiration Date:   01/09/2020  . Cancer antigen 19-9    Standing Status:   Future    Number of Occurrences:   1    Standing Expiration Date:   01/09/2020  . Hepatitis panel, acute    Standing Status:   Future    Number of Occurrences:   1    Standing Expiration Date:   01/09/2020  . AFP tumor marker    Standing Status:   Future    Number of Occurrences:   1    Standing Expiration Date:   01/09/2020    All questions were answered. The patient knows to call the clinic with any problems, questions or concerns.     Derek Jack, MD 01/09/2019 2:17 PM

## 2019-01-10 LAB — AFP TUMOR MARKER: AFP, Serum, Tumor Marker: 2 ng/mL (ref 0.0–8.3)

## 2019-01-10 LAB — CEA: CEA: 32.2 ng/mL — ABNORMAL HIGH (ref 0.0–4.7)

## 2019-01-10 LAB — HEPATITIS PANEL, ACUTE
HCV Ab: <0.1 s/co ratio (ref 0.0–0.9)
HEP B C IGM: NEGATIVE
Hep A IgM: NEGATIVE
Hepatitis B Surface Ag: NEGATIVE

## 2019-01-10 LAB — CANCER ANTIGEN 19-9: CA 19-9: <2 U/mL (ref 0–35)

## 2019-01-17 ENCOUNTER — Encounter (HOSPITAL_COMMUNITY): Payer: Medicare Other

## 2019-01-17 ENCOUNTER — Ambulatory Visit (HOSPITAL_COMMUNITY)
Admission: RE | Admit: 2019-01-17 | Discharge: 2019-01-17 | Disposition: A | Discharge status: Home / Self Care | Payer: Medicare Other | Source: Ambulatory Visit | Attending: Nurse Practitioner | Admitting: Nurse Practitioner

## 2019-01-17 DIAGNOSIS — R160 Hepatomegaly, not elsewhere classified: Secondary | ICD-10-CM | POA: Diagnosis not present

## 2019-01-17 LAB — GLUCOSE, CAPILLARY: Glucose-Capillary: 115 mg/dL — ABNORMAL HIGH (ref 70–99)

## 2019-01-17 MED ORDER — FLUDEOXYGLUCOSE F - 18 (FDG) INJECTION
10.6800 | Freq: Once | INTRAVENOUS | Status: AC | PRN
  Administered 2019-01-17: 10.68 via INTRAVENOUS

## 2019-01-18 ENCOUNTER — Other Ambulatory Visit: Payer: Self-pay | Admitting: Radiology

## 2019-01-18 ENCOUNTER — Other Ambulatory Visit: Payer: Self-pay | Admitting: Student

## 2019-01-18 ENCOUNTER — Other Ambulatory Visit: Payer: Self-pay | Admitting: Physician Assistant

## 2019-01-19 ENCOUNTER — Other Ambulatory Visit (HOSPITAL_COMMUNITY): Payer: Self-pay | Admitting: Hematology

## 2019-01-19 ENCOUNTER — Ambulatory Visit (HOSPITAL_COMMUNITY)
Admission: RE | Admit: 2019-01-19 | Discharge: 2019-01-19 | Disposition: A | Discharge status: Home / Self Care | Payer: Medicare Other | Source: Ambulatory Visit | Attending: Nurse Practitioner | Admitting: Nurse Practitioner

## 2019-01-19 ENCOUNTER — Other Ambulatory Visit: Payer: Self-pay

## 2019-01-19 ENCOUNTER — Other Ambulatory Visit (HOSPITAL_COMMUNITY): Payer: Self-pay | Admitting: Nurse Practitioner

## 2019-01-19 DIAGNOSIS — R160 Hepatomegaly, not elsewhere classified: Secondary | ICD-10-CM | POA: Insufficient documentation

## 2019-01-19 LAB — CBC
HCT: 34 % — ABNORMAL LOW (ref 36.0–46.0)
Hemoglobin: 11.4 g/dL — ABNORMAL LOW (ref 12.0–15.0)
MCH: 30.8 pg (ref 26.0–34.0)
MCHC: 33.5 g/dL (ref 30.0–36.0)
MCV: 91.9 fL (ref 80.0–100.0)
NRBC: 0 % (ref 0.0–0.2)
Platelets: 145 10*3/uL — ABNORMAL LOW (ref 150–400)
RBC: 3.7 MIL/uL — ABNORMAL LOW (ref 3.87–5.11)
RDW: 13.5 % (ref 11.5–15.5)
WBC: 5.2 10*3/uL (ref 4.0–10.5)

## 2019-01-19 LAB — PROTIME-INR
INR: 0.9
Prothrombin Time: 12 seconds (ref 11.4–15.2)

## 2019-01-19 MED ORDER — GELATIN ABSORBABLE 12-7 MM EX MISC
CUTANEOUS | Status: AC
  Filled 2019-01-19: qty 1

## 2019-01-19 MED ORDER — FENTANYL CITRATE (PF) 100 MCG/2ML IJ SOLN
INTRAMUSCULAR | Status: AC
  Filled 2019-01-19: qty 2

## 2019-01-19 MED ORDER — MIDAZOLAM HCL 2 MG/2ML IJ SOLN
INTRAMUSCULAR | Status: AC
  Filled 2019-01-19: qty 2

## 2019-01-19 MED ORDER — LIDOCAINE HCL (PF) 1 % IJ SOLN
INTRAMUSCULAR | Status: AC
  Filled 2019-01-19: qty 30

## 2019-01-19 MED ORDER — SODIUM CHLORIDE 0.9 % IV SOLN: INTRAVENOUS | Status: DC

## 2019-01-19 NOTE — H&P (Signed)
Chief Complaint: Patient was seen in consultation today for liver/biliary mass  Referring Physician(s): Lockamy,Randi L  Supervising Physician: Markus Daft  Patient Status: [facility] - In-pt  History of Present Illness: Nicole Ibarra is a 63 y.o. female with history of anxiety/deprsesion, HTN who presented to the ED in January for abdominal pain for the past year.   CT Abdomen Pelvis 12/29/18 showed: 1. 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 Clinical correlation and tissue sampling recommended. 2. Lobulated mass versus metastatic lymph node in the porta pedis. 3. No bowel obstruction or active inflammation. Normal appendix. 4. Myomatous uterus.  Patient was seen by Oncology who recommend percutaneous biopsy. Case reviewed and approved by Dr. Pascal Lux.   Patient presents today in her usual state of health.  She endorses constipation but no new or worsening abdominal pain today.  She has been NPO as of 8AM.  She does not take blood thinners.   Past Medical History:  Diagnosis Date  . Anxiety   . Arthritis   . Depression   . Hypertension     Past Surgical History:  Procedure Laterality Date  . COLONOSCOPY N/A 10/23/2016   Procedure: COLONOSCOPY;  Surgeon: Danie Binder, MD;  Location: [facility];  Service: Endoscopy;  Laterality: N/A;  11:30 Am  . POLYPECTOMY  10/23/2016   Procedure: POLYPECTOMY;  Surgeon: Danie Binder, MD;  Location: [facility];  Service: Endoscopy;;  sigmoid colon polyp  . RT HIP SURGERY    . TOTAL HIP ARTHROPLASTY Left 04/07/2016   Procedure: LEFT TOTAL HIP ARTHROPLASTY ANTERIOR APPROACH;  Surgeon: Paralee Cancel, MD;  Location: [facility];  Service: Orthopedics;  Laterality: Left;  Unsuccessful for Spinal, went to General    Allergies: Patient has no known allergies.  Medications: Prior to Admission medications   Medication Sig Start Date End Date Taking?  Authorizing Provider  amLODIPine-Valsartan-HCTZ 10-320-25 MG TABS Take 1 tablet by mouth daily. 12/15/18  Yes [provider]  atorvastatin (LIPITOR) 20 MG tablet Take 20 mg by mouth daily. 11/14/18  Yes [provider]  doxazosin (CARDURA) 2 MG tablet Take 2 mg by mouth every evening.    Yes [provider]  vitamin B-12 (CYANOCOBALAMIN) 50 MCG tablet Take 50 mcg by mouth daily.   Yes [provider]     Family History  Problem Relation Age of Onset  . Hypertension Mother   . Cancer Father   . Hypertension Brother   . Cancer Brother   . Hypertension Sister   . Cancer Sister     Social History   Socioeconomic History  . Marital status: Single    Spouse name: Not on file  . Number of children: Not on file  . Years of education: Not on file  . Highest education level: Not on file  Occupational History  . Not on file  Social Needs  . Financial resource strain: Not hard at all  . Food insecurity:    Worry: Never true    Inability: Never true  . Transportation needs:    Medical: No    Non-medical: No  Tobacco Use  . Smoking status: Current Every Day Smoker    Packs/day: 0.25    Years: 42.00    Pack years: 10.50  . Smokeless tobacco: Never Used  Substance and Sexual Activity  . Alcohol use: No  . Drug use: No  . Sexual activity: Not Currently  Lifestyle  .  Physical activity:    Days per week: 0 days    Minutes per session: 0 min  . Stress: Only a little  Relationships  . Social connections:    Talks on phone: Three times a week    Gets together: Three times a week    Attends religious service: More than 4 times per year    Active member of club or organization: Yes    Attends meetings of clubs or organizations: More than 4 times per year    Relationship status: Never married  Other Topics Concern  . Not on file  Social History Narrative  . Not on file     Review of Systems: A 12 point ROS discussed and pertinent positives are  indicated in the HPI above.  All other systems are negative.  Review of Systems  Constitutional: Negative for fatigue and fever.  Respiratory: Negative for cough and shortness of breath.   Cardiovascular: Negative for chest pain.  Gastrointestinal: Positive for abdominal pain and constipation. Negative for diarrhea and nausea.  Psychiatric/Behavioral: Negative for behavioral problems and confusion.    Vital Signs: BP (!) 151/54   Pulse 61   Temp 98.1 F (36.7 C)   Ht 5\' 4"  (1.626 m)   Wt 213 lb (96.6 kg)   SpO2 100%   BMI 36.56 kg/m   Physical Exam Vitals signs and nursing note reviewed.  Constitutional:      Appearance: Normal appearance.  Cardiovascular:     Rate and Rhythm: Normal rate and regular rhythm.     Heart sounds: No murmur. No gallop.   Pulmonary:     Effort: Pulmonary effort is normal. No respiratory distress.     Breath sounds: Normal breath sounds.  Abdominal:     General: Abdomen is flat. There is no distension.     Palpations: Abdomen is soft. There is no mass.  Skin:    General: Skin is warm and dry.  Neurological:     General: No focal deficit present.     Mental Status: She is alert and oriented to person, place, and time.  Psychiatric:        Mood and Affect: Mood normal.        Behavior: Behavior normal.        Thought Content: Thought content normal.        Judgment: Judgment normal.      MD Evaluation Airway: WNL Heart: WNL Abdomen: WNL Chest/ Lungs: WNL ASA  Classification: 3 Mallampati/Airway Score: Two   Imaging: Ct Abdomen Pelvis W Contrast  Result Date: 12/29/2018 CLINICAL DATA:  63 year old female with abdominal pain. Concern for diverticulitis. EXAM: CT ABDOMEN AND PELVIS WITH CONTRAST TECHNIQUE: Multidetector CT imaging of the abdomen and pelvis was performed using the standard protocol following bolus administration of intravenous contrast. CONTRAST:  152mL ISOVUE-300 IOPAMIDOL (ISOVUE-300) INJECTION 61% COMPARISON:   Abdominal ultrasound dated 08/07/2015. FINDINGS: Lower chest: The visualized lung bases are clear. No intra-abdominal free air or free fluid. Hepatobiliary: There is a somewhat lobulated 2 cm low attenuating lesion in the right lobe of the liver (series 2, image 17) which demonstrates fluid attenuation, possibly a cyst. A 1.3 cm hypodense lesion adjacent to the gallbladder (series 2, image 25) consistent with metastatic disease. There is diffuse thickening and haziness of the gallbladder with soft tissue mass extending to the inferior liver (segment IV B/V). Findings most consistent with malignancy, likely gallbladder carcinoma with infiltration of the liver parenchyma versus cholangiocarcinoma or less likely a primary  liver carcinoma. There is a 1.6 x 3.3 cm infiltrative mass within the liver abutting the gallbladder fundus. There is mild dilatation of the biliary tree. There is a 3.2 x 3.2 cm confluent mass or enlarged lymph node in the porta pedis (series 2, image 23) with soft tissue mass extending superiorly along the main hepatic duct and central biliary trees. Pancreas: Unremarkable. No pancreatic ductal dilatation or surrounding inflammatory changes. Spleen: Normal in size without focal abnormality. Adrenals/Urinary Tract: The adrenal glands are unremarkable. Mild bilateral renal atrophy. There is no hydronephrosis on either side. There is symmetric enhancement and excretion of contrast by both kidneys. The visualized ureters appear unremarkable. The urinary bladder is only partially distended. There is limited evaluation of the bladder due to streak artifact caused by metallic hip arthroplasties. Stomach/Bowel: There is moderate stool throughout the colon. There is no bowel obstruction or active inflammation. Normal appendix. Vascular/Lymphatic: Moderate aortoiliac atherosclerotic disease. No portal venous gas. Lobulated portal patent mass described above which appears to abut the main portal vein. The SMV,  splenic vein, and main portal vein are patent. Reproductive: Myomatous uterus with multiple partially calcified fibroids. Other: None Musculoskeletal: Degenerative changes of the spine. No acute osseous pathology. Bilateral hip arthroplasty. IMPRESSION: 1. 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 Clinical correlation and tissue sampling recommended. 2. Lobulated mass versus metastatic lymph node in the porta pedis. 3. No bowel obstruction or active inflammation. Normal appendix. 4. Myomatous uterus. Electronically Signed   By: Anner Crete M.D.   On: 12/29/2018 21:48   Nm Pet Image Initial (pi) Skull Base To Thigh  Result Date: 01/17/2019 CLINICAL DATA:  Initial treatment strategy for liver and porta hepatis masses. EXAM: NUCLEAR MEDICINE PET SKULL BASE TO THIGH TECHNIQUE: 10.7 mCi F-18 FDG was injected intravenously. Full-ring PET imaging was performed from the skull base to thigh after the radiotracer. CT data was obtained and used for attenuation correction and anatomic localization. Fasting blood glucose: 115 mg/dl COMPARISON:  12/29/2018 CT of the abdomen and pelvis FINDINGS: Mediastinal blood pool activity: SUV max  3.6 NECK: No areas of abnormal hypermetabolism. Incidental CT findings: No cervical adenopathy. Lipoma within the right parotid at 2.4 cm. CHEST: No pulmonary parenchymal or thoracic nodal hypermetabolism. Incidental CT findings: Tiny hiatal hernia. ABDOMEN/PELVIS: A focus of central right hepatic lobe hypermetabolism likely corresponds to the site of intrahepatic ductal obstruction on the prior diagnostic CT. This measures a S.U.V. max of 8.3 on approximately image 104/4. Separate area of hepatic hypermetabolism corresponds to pericholecystic hepatic hypoattenuation. This is difficult to differentiate from the gallbladder fundus, and measures a S.U.V. max of 9.1 on image 112/4. Hypermetabolism  corresponding to the porta hepatis presumed nodal mass. This measures on the order of 3.3 cm and a S.U.V. max of 8.8 on image 109/4. Incidental CT findings: Normal adrenal glands. Abdominal aortic atherosclerosis. Right hepatic lobe cyst. Degraded evaluation of the pelvis, secondary to beam hardening artifact from bilateral hip arthroplasty. Calcified uterine fibroids. Left mons pubis skin lesion of 8 mm on image 178/4, unchanged. SKELETON: No abnormal marrow activity. Incidental CT findings: Bilateral hip arthroplasty. IMPRESSION: 1. Two foci of hepatic hypermetabolism. The pericholecystic liver lesion may represent direct extension of a gallbladder primary. The adjacent, central right hepatic lobe lesion could represent a metastasis or a primary cholangiocarcinoma. 2. Porta hepatis hypermetabolic adenopathy, likely indicative of metastatic disease. 3. No extrahepatic hypermetabolic primary or metastatic disease identified. Electronically Signed  By: Abigail Miyamoto M.D.   On: 01/17/2019 11:25    Labs:  CBC: Recent Labs    12/29/18 1758 01/19/19 1109  WBC 5.3 5.2  HGB 12.2 11.4*  HCT 37.6 34.0*  PLT 181 145*    COAGS: Recent Labs    01/19/19 1109  INR 0.90    BMP: Recent Labs    12/29/18 1758  NA 139  K 3.7  CL 101  CO2 28  GLUCOSE 106*  BUN 20  CALCIUM 10.3  CREATININE 1.18*  GFRNONAA 49*  GFRAA 57*    LIVER FUNCTION TESTS: Recent Labs    12/29/18 1758  BILITOT 0.9  AST 49*  ALT 79*  ALKPHOS 201*  PROT 8.1  ALBUMIN 4.1    TUMOR MARKERS: No results for input(s): AFPTM, CEA, CA199, CHROMGRNA in the last 8760 hours.  Assessment and Plan: Liver vs. Biliary mass Patient with past medical history of anxiety/depression, HTN presents with complaint of abdominal pain.  Abdominal imaging shows a liver vs. Biliary mass.  IR consulted for biopsy at the request of Francene Finders, NP. Case reviewed by Dr. Pascal Lux who approves patient for procedure.  Patient presents today in  their usual state of health.  She has been NPO and is not currently on blood thinners.   Risks and benefits of biopsy was discussed with the patient and/or patient's family including, but not limited to bleeding, infection, damage to adjacent structures or low yield requiring additional tests.  All of the questions were answered and there is agreement to proceed.  Consent signed and in chart.  Thank you for this interesting consult.  I greatly enjoyed meeting Nicole Ibarra and look forward to participating in their care.  A copy of this report was sent to the requesting provider on this date.  Electronically Signed: Docia Barrier, PA 01/19/2019, 12:25 PM   I spent a total of  30 Minutes   in face to face in clinical consultation, greater than 50% of which was counseling/coordinating care for biliary mass.

## 2019-01-19 NOTE — Sedation Documentation (Signed)
Procedure canceled per MD.

## 2019-01-19 NOTE — Progress Notes (Signed)
Patient ID: Nicole Ibarra, female   DOB: May 05, 1956, 63 y.o.   MRN: [id number] Scheduled for US guided liver lesion biopsy.  Liver lesion could not be confidently identified with [place].  Will need to use CT guidance for liver lesion biopsy.  Will re-schedule for CT guided biopsy.

## 2019-01-24 ENCOUNTER — Other Ambulatory Visit: Payer: Self-pay | Admitting: Radiology

## 2019-01-25 ENCOUNTER — Other Ambulatory Visit: Payer: Self-pay | Admitting: Radiology

## 2019-01-26 ENCOUNTER — Ambulatory Visit (HOSPITAL_COMMUNITY): Payer: Medicare Other | Admitting: Hematology

## 2019-01-26 ENCOUNTER — Ambulatory Visit (HOSPITAL_COMMUNITY): Admission: RE | Admit: 2019-01-26 | Payer: Medicare Other | Source: Ambulatory Visit

## 2019-01-30 ENCOUNTER — Other Ambulatory Visit: Payer: Self-pay | Admitting: Physician Assistant

## 2019-01-31 ENCOUNTER — Ambulatory Visit (HOSPITAL_COMMUNITY)
Admission: RE | Admit: 2019-01-31 | Discharge: 2019-01-31 | Disposition: A | Discharge status: Home / Self Care | Payer: Medicare Other | Source: Ambulatory Visit | Attending: Hematology | Admitting: Hematology

## 2019-01-31 ENCOUNTER — Encounter (HOSPITAL_COMMUNITY): Payer: Self-pay

## 2019-01-31 ENCOUNTER — Other Ambulatory Visit: Payer: Self-pay

## 2019-01-31 DIAGNOSIS — R160 Hepatomegaly, not elsewhere classified: Secondary | ICD-10-CM | POA: Diagnosis present

## 2019-01-31 DIAGNOSIS — C801 Malignant (primary) neoplasm, unspecified: Secondary | ICD-10-CM | POA: Diagnosis not present

## 2019-01-31 DIAGNOSIS — Z79899 Other long term (current) drug therapy: Secondary | ICD-10-CM | POA: Insufficient documentation

## 2019-01-31 DIAGNOSIS — C787 Secondary malignant neoplasm of liver and intrahepatic bile duct: Secondary | ICD-10-CM | POA: Insufficient documentation

## 2019-01-31 DIAGNOSIS — F329 Major depressive disorder, single episode, unspecified: Secondary | ICD-10-CM | POA: Diagnosis not present

## 2019-01-31 DIAGNOSIS — I10 Essential (primary) hypertension: Secondary | ICD-10-CM | POA: Insufficient documentation

## 2019-01-31 LAB — CBC WITH DIFFERENTIAL/PLATELET
Abs Immature Granulocytes: 0.01 10*3/uL (ref 0.00–0.07)
BASOS PCT: 1 %
Basophils Absolute: 0 10*3/uL (ref 0.0–0.1)
Eosinophils Absolute: 0.2 10*3/uL (ref 0.0–0.5)
Eosinophils Relative: 5 %
HCT: 31.5 % — ABNORMAL LOW (ref 36.0–46.0)
Hemoglobin: 10.5 g/dL — ABNORMAL LOW (ref 12.0–15.0)
Immature Granulocytes: 0 %
Lymphocytes Relative: 38 %
Lymphs Abs: 1.6 10*3/uL (ref 0.7–4.0)
MCH: 31.4 pg (ref 26.0–34.0)
MCHC: 33.3 g/dL (ref 30.0–36.0)
MCV: 94.3 fL (ref 80.0–100.0)
Monocytes Absolute: 0.3 10*3/uL (ref 0.1–1.0)
Monocytes Relative: 8 %
Neutro Abs: 2 10*3/uL (ref 1.7–7.7)
Neutrophils Relative %: 48 %
Platelets: 150 10*3/uL (ref 150–400)
RBC: 3.34 MIL/uL — ABNORMAL LOW (ref 3.87–5.11)
RDW: 13.8 % (ref 11.5–15.5)
WBC: 4 10*3/uL (ref 4.0–10.5)
nRBC: 0 % (ref 0.0–0.2)

## 2019-01-31 LAB — COMPREHENSIVE METABOLIC PANEL
ALT: 65 U/L — ABNORMAL HIGH (ref 0–44)
AST: 45 U/L — ABNORMAL HIGH (ref 15–41)
Albumin: 3.7 g/dL (ref 3.5–5.0)
Alkaline Phosphatase: 233 U/L — ABNORMAL HIGH (ref 38–126)
Anion gap: 9 (ref 5–15)
BUN: 17 mg/dL (ref 8–23)
CALCIUM: 9.4 mg/dL (ref 8.9–10.3)
CO2: 27 mmol/L (ref 22–32)
Chloride: 103 mmol/L (ref 98–111)
Creatinine, Ser: 1.04 mg/dL — ABNORMAL HIGH (ref 0.44–1.00)
GFR calc Af Amer: >60 mL/min (ref >60)
GFR calc non Af Amer: 57 mL/min — ABNORMAL LOW (ref >60)
Glucose, Bld: 114 mg/dL — ABNORMAL HIGH (ref 70–99)
Potassium: 2.8 mmol/L — ABNORMAL LOW (ref 3.5–5.1)
Sodium: 139 mmol/L (ref 135–145)
Total Bilirubin: 0.8 mg/dL (ref 0.3–1.2)
Total Protein: 7.2 g/dL (ref 6.5–8.1)

## 2019-01-31 LAB — PROTIME-INR
INR: 0.9 (ref 0.8–1.2)
Prothrombin Time: 11.9 seconds (ref 11.4–15.2)

## 2019-01-31 MED ORDER — SODIUM CHLORIDE 0.9 % IV SOLN
INTRAVENOUS | Status: DC
  Administered 2019-01-31: 08:00:00 via INTRAVENOUS

## 2019-01-31 MED ORDER — SODIUM CHLORIDE (PF) 0.9 % IJ SOLN
INTRAMUSCULAR | Status: AC
  Filled 2019-01-31: qty 50

## 2019-01-31 MED ORDER — FENTANYL CITRATE (PF) 100 MCG/2ML IJ SOLN
INTRAMUSCULAR | Status: AC | PRN
  Administered 2019-01-31 (×2): 50 ug via INTRAVENOUS

## 2019-01-31 MED ORDER — FENTANYL CITRATE (PF) 100 MCG/2ML IJ SOLN
INTRAMUSCULAR | Status: AC
  Filled 2019-01-31: qty 2

## 2019-01-31 MED ORDER — MIDAZOLAM HCL 2 MG/2ML IJ SOLN
INTRAMUSCULAR | Status: AC
  Filled 2019-01-31: qty 4

## 2019-01-31 MED ORDER — IOPAMIDOL (ISOVUE-370) INJECTION 76%: 100.0000 mL | Freq: Once | INTRAVENOUS | Status: DC | PRN

## 2019-01-31 MED ORDER — MIDAZOLAM HCL 2 MG/2ML IJ SOLN
INTRAMUSCULAR | Status: AC | PRN
  Administered 2019-01-31 (×4): 1 mg via INTRAVENOUS

## 2019-01-31 NOTE — Discharge Instructions (Signed)
Moderate Conscious Sedation, Adult, Care After  These instructions provide you with information about caring for yourself after your procedure. Your health care provider may also give you more specific instructions. Your treatment has been planned according to current medical practices, but problems sometimes occur. Call your health care provider if you have any problems or questions after your procedure.  What can I expect after the procedure?  After your procedure, it is common:  · To feel sleepy for several hours.  · To feel clumsy and have poor balance for several hours.  · To have poor judgment for several hours.  · To vomit if you eat too soon.  Follow these instructions at home:  For at least 24 hours after the procedure:    · Do not:  ? Participate in activities where you could fall or become injured.  ? Drive.  ? Use heavy machinery.  ? Drink alcohol.  ? Take sleeping pills or medicines that cause drowsiness.  ? Make important decisions or sign legal documents.  ? Take care of children on your own.  · Rest.  Eating and drinking  · Follow the diet recommended by your health care provider.  · If you vomit:  ? Drink water, juice, or soup when you can drink without vomiting.  ? Make sure you have little or no nausea before eating solid foods.  General instructions  · Have a responsible adult stay with you until you are awake and alert.  · Take over-the-counter and prescription medicines only as told by your health care provider.  · If you smoke, do not smoke without supervision.  · Keep all follow-up visits as told by your health care provider. This is important.  Contact a health care provider if:  · You keep feeling nauseous or you keep vomiting.  · You feel light-headed.  · You develop a rash.  · You have a fever.  Get help right away if:  · You have trouble breathing.  This information is not intended to replace advice given to you by your health care provider. Make sure you discuss any questions you have  with your health care provider.  Document Released: 09/13/2013 Document Revised: 04/27/2016 Document Reviewed: 03/14/2016  Elsevier Interactive Patient Education © 2019 [employer]  Liver Biopsy, Care After  These instructions give you information on caring for yourself after your procedure. Your doctor may also give you more specific instructions. Call your doctor if you have any problems or questions after your procedure.  What can I expect after the procedure?  After the procedure, it is common to have:  · Pain and soreness where the biopsy was done.  · Bruising around the area where the biopsy was done.  · Sleepiness and be tired for a few days.  Follow these instructions at home:  Medicines  · Take over-the-counter and prescription medicines only as told by your doctor.  · If you were prescribed an antibiotic medicine, take it as told by your doctor. Do not stop taking the antibiotic even if you start to feel better.  · Do not take medicines such as aspirin and ibuprofen. These medicines can thin your blood. Do not take these medicines unless your doctor tells you to take them.  · If you are taking prescription pain medicine, take actions to prevent or treat constipation. Your doctor may recommend that you:  ? Drink enough fluid to keep your pee (urine) clear or pale yellow.  ? Take over-the-counter or   prescription medicines.  ? Eat foods that are high in fiber, such as fresh fruits and vegetables, whole grains, and beans.  ? Limit foods that are high in fat and processed sugars, such as fried and sweet foods.  Caring for your cut  · Follow instructions from your doctor about how to take care of your cuts from surgery (incisions). Make sure you:  ? Wash your hands with soap and water before you change your bandage (dressing). If you cannot use soap and water, use hand sanitizer.  ? Change your bandage as told by your doctor.  ? Leave stitches (sutures), skin glue, or skin tape (adhesive) strips in place. They  may need to stay in place for 2 weeks or longer. If tape strips get loose and curl up, you may trim the loose edges. Do not remove tape strips completely unless your doctor says it is okay.  · Check your cuts every day for signs of infection. Check for:  ? Redness, swelling, or more pain.  ? Fluid or blood.  ? Pus or a bad smell.  ? Warmth.  · Do not take baths, swim, or use a hot tub until your doctor says it is okay to do so.  Activity    · Rest at home for 1-2 days or as told by your doctor.  ? Avoid sitting for a long time without moving. Get up to take short walks every 1-2 hours.  · Return to your normal activities as told by your doctor. Ask what activities are safe for you.  · Do not do these things in the first 24 hours:  ? Drive.  ? Use machinery.  ? Take a bath or shower.  · Do not lift more than 10 pounds (4.5 kg) or play contact sports for the first 2 weeks.  General instructions    · Do not drink alcohol in the first week after the procedure.  · Have someone stay with you for at least 24 hours after the procedure.  · Get your test results. Ask your doctor or the department that is doing the test:  ? When will my results be ready?  ? How will I get my results?  ? What are my treatment options?  ? What other tests do I need?  ? What are my next steps?  · Keep all follow-up visits as told by your doctor. This is important.  Contact a doctor if:  · A cut bleeds and leaves more than just a small spot of blood.  · A cut is red, puffs up (swells), or hurts more than before.  · Fluid or something else comes from a cut.  · A cut smells bad.  · You have a fever or chills.  Get help right away if:  · You have swelling, bloating, or pain in your belly (abdomen).  · You get dizzy or faint.  · You have a rash.  · You feel sick to your stomach (nauseous) or throw up (vomit).  · You have trouble breathing, feel short of breath, or feel faint.  · Your chest hurts.  · You have problems talking or seeing.  · You have  trouble with your balance or moving your arms or legs.  Summary  · After the procedure, it is common to have pain, soreness, bruising, and tiredness.  · Your doctor will tell you how to take care of yourself at home. Change your bandage, take your medicines, and limit your activities   as told by your doctor.  · Call your doctor if you have symptoms of infection. Get help right away if your belly swells, your cut bleeds a lot, or you have trouble talking or breathing.  This information is not intended to replace advice given to you by your health care provider. Make sure you discuss any questions you have with your health care provider.  Document Released: 09/01/2008 Document Revised: 12/03/2017 Document Reviewed: 12/03/2017  Elsevier Interactive Patient Education © 2019 [employer]

## 2019-01-31 NOTE — H&P (Signed)
Chief Complaint: Liver/biliary m ass  Referring Physician(s): Katragadda,Sreedhar  Supervising Physician: Daryll Brod  Patient Status: [facility] - Out-pt  History of Present Illness: Nicole Ibarra is a 63 y.o. female who was here in IR on 01/19/2019 for biopsy.  The lesion could not be identified on [place] and she was rescheduled for today for CT guided biopsy.  CT Abdomen Pelvis 12/29/18 showed: 1. 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 Clinical correlation and tissue sampling recommended. 2. Lobulated mass versus metastatic lymph node in the porta pedis.  She feels well today. No nausea/vomiting. No Fever/chills. ROS negative.  She does not take blood thinners.  She is NPO.  Past Medical History:  Diagnosis Date  . Anxiety   . Arthritis   . Depression   . Hypertension     Past Surgical History:  Procedure Laterality Date  . COLONOSCOPY N/A 10/23/2016   Procedure: COLONOSCOPY;  Surgeon: Danie Binder, MD;  [facility];  Service: Endoscopy;  Laterality: N/A;  11:30 Am  . POLYPECTOMY  10/23/2016   Procedure: POLYPECTOMY;  Surgeon: Danie Binder, MD;  Location: [facility];  Service: Endoscopy;;  sigmoid colon polyp  . RT HIP SURGERY    . TOTAL HIP ARTHROPLASTY Left 04/07/2016   Procedure: LEFT TOTAL HIP ARTHROPLASTY ANTERIOR APPROACH;  Surgeon: Paralee Cancel, MD;  Location: [facility];  Service: Orthopedics;  Laterality: Left;  Unsuccessful for Spinal, went to General    Allergies: Patient has no known allergies.  Medications: Prior to Admission medications   Medication Sig Start Date End Date Taking? Authorizing Provider  amLODIPine-Valsartan-HCTZ 10-320-25 MG TABS Take 1 tablet by mouth daily. 12/15/18  Yes [provider]  atorvastatin (LIPITOR) 20 MG tablet Take 20 mg by mouth daily. 11/14/18  Yes [provider]  doxazosin (CARDURA) 2 MG  tablet Take 2 mg by mouth every evening.    Yes [provider]  vitamin B-12 (CYANOCOBALAMIN) 50 MCG tablet Take 50 mcg by mouth daily.   Yes [provider]     Family History  Problem Relation Age of Onset  . Hypertension Mother   . Cancer Father   . Hypertension Brother   . Cancer Brother   . Hypertension Sister   . Cancer Sister     Social History   Socioeconomic History  . Marital status: Single    Spouse name: Not on file  . Number of children: Not on file  . Years of education: Not on file  . Highest education level: Not on file  Occupational History  . Not on file  Social Needs  . Financial resource strain: Not hard at all  . Food insecurity:    Worry: Never true    Inability: Never true  . Transportation needs:    Medical: No    Non-medical: No  Tobacco Use  . Smoking status: Current Every Day Smoker    Packs/day: 0.25    Years: 42.00    Pack years: 10.50  . Smokeless tobacco: Never Used  Substance and Sexual Activity  . Alcohol use: No  . Drug use: No  . Sexual activity: Not Currently  Lifestyle  . Physical activity:    Days per week: 0 days    Minutes per session: 0 min  . Stress: Only a little  Relationships  . Social connections:    Talks on phone: Three times a week  Gets together: Three times a week    Attends religious service: More than 4 times per year    Active member of club or organization: Yes    Attends meetings of clubs or organizations: More than 4 times per year    Relationship status: Never married  Other Topics Concern  . Not on file  Social History Narrative  . Not on file     Review of Systems: A 12 point ROS discussed and pertinent positives are indicated in the HPI above.  All other systems are negative.  Review of Systems  Vital Signs: BP (!) 148/65 (BP Location: Left Arm)   Pulse 75   Resp 11   SpO2 100%   Physical Exam Vitals signs reviewed.  Constitutional:      Appearance: She is  obese.  HENT:     Head: Normocephalic and atraumatic.  Neck:     Musculoskeletal: Normal range of motion.  Cardiovascular:     Rate and Rhythm: Normal rate and regular rhythm.  Pulmonary:     Effort: Pulmonary effort is normal.     Breath sounds: Normal breath sounds.  Abdominal:     General: There is no distension.     Palpations: Abdomen is soft.     Tenderness: There is no abdominal tenderness.  Musculoskeletal: Normal range of motion.  Skin:    General: Skin is warm and dry.  Neurological:     General: No focal deficit present.     Mental Status: She is alert and oriented to person, place, and time.  Psychiatric:        Mood and Affect: Mood normal.        Behavior: Behavior normal.        Thought Content: Thought content normal.        Judgment: Judgment normal.     Imaging: Nm Pet Image Initial (pi) Skull Base To Thigh  Result Date: 01/17/2019 CLINICAL DATA:  Initial treatment strategy for liver and porta hepatis masses. EXAM: NUCLEAR MEDICINE PET SKULL BASE TO THIGH TECHNIQUE: 10.7 mCi F-18 FDG was injected intravenously. Full-ring PET imaging was performed from the skull base to thigh after the radiotracer. CT data was obtained and used for attenuation correction and anatomic localization. Fasting blood glucose: 115 mg/dl COMPARISON:  12/29/2018 CT of the abdomen and pelvis FINDINGS: Mediastinal blood pool activity: SUV max  3.6 NECK: No areas of abnormal hypermetabolism. Incidental CT findings: No cervical adenopathy. Lipoma within the right parotid at 2.4 cm. CHEST: No pulmonary parenchymal or thoracic nodal hypermetabolism. Incidental CT findings: Tiny hiatal hernia. ABDOMEN/PELVIS: A focus of central right hepatic lobe hypermetabolism likely corresponds to the site of intrahepatic ductal obstruction on the prior diagnostic CT. This measures a S.U.V. max of 8.3 on approximately image 104/4. Separate area of hepatic hypermetabolism corresponds to pericholecystic hepatic  hypoattenuation. This is difficult to differentiate from the gallbladder fundus, and measures a S.U.V. max of 9.1 on image 112/4. Hypermetabolism corresponding to the porta hepatis presumed nodal mass. This measures on the order of 3.3 cm and a S.U.V. max of 8.8 on image 109/4. Incidental CT findings: Normal adrenal glands. Abdominal aortic atherosclerosis. Right hepatic lobe cyst. Degraded evaluation of the pelvis, secondary to beam hardening artifact from bilateral hip arthroplasty. Calcified uterine fibroids. Left mons pubis skin lesion of 8 mm on image 178/4, unchanged. SKELETON: No abnormal marrow activity. Incidental CT findings: Bilateral hip arthroplasty. IMPRESSION: 1. Two foci of hepatic hypermetabolism. The pericholecystic liver lesion may represent direct  extension of a gallbladder primary. The adjacent, central right hepatic lobe lesion could represent a metastasis or a primary cholangiocarcinoma. 2. Porta hepatis hypermetabolic adenopathy, likely indicative of metastatic disease. 3. No extrahepatic hypermetabolic primary or metastatic disease identified. Electronically Signed   By: Abigail Miyamoto M.D.   On: 01/17/2019 11:25   US Abdomen Limited  Result Date: 01/19/2019 CLINICAL DATA:  63 year old with suspicious liver lesions on recent CT and PET-CT. Patient presents for liver lesion biopsy. EXAM: ULTRASOUND ABDOMEN LIMITED COMPARISON:  PET-CT 01/17/2019 and abdominal CT 12/29/2018 FINDINGS: Liver was thoroughly evaluated with ultrasound. The area of concern is near the gallbladder. Evaluation of the liver near the gallbladder is very difficult due to overlying ribs. The liver parenchyma in this area is slightly heterogeneous but a discrete lesion was not confidently identified. Gallbladder did not appear to be grossly abnormal except for possible stone. Again noted is a hepatic cyst along the right hepatic lobe. IMPRESSION: Suspicious liver lesions were not confidently identified with ultrasound. As  a result, the ultrasound-guided biopsy was canceled. Patient will be rescheduled for CT-guided liver lesion biopsy. Electronically Signed   By: Markus Daft M.D.   On: 01/19/2019 18:26    Labs:  CBC: Recent Labs    12/29/18 1758 01/19/19 1109 01/31/19 0755  WBC 5.3 5.2 4.0  HGB 12.2 11.4* 10.5*  HCT 37.6 34.0* 31.5*  PLT 181 145* 150    COAGS: Recent Labs    01/19/19 1109 01/31/19 0738  INR 0.90 0.9    BMP: Recent Labs    12/29/18 1758 01/31/19 0738  NA 139 139  K 3.7 2.8*  CL 101 103  CO2 28 27  GLUCOSE 106* 114*  BUN 20 17  CALCIUM 10.3 9.4  CREATININE 1.18* 1.04*  GFRNONAA 49* 57*  GFRAA 57* >60    LIVER FUNCTION TESTS: Recent Labs    12/29/18 1758 01/31/19 0738  BILITOT 0.9 0.8  AST 49* 45*  ALT 79* 65*  ALKPHOS 201* 233*  PROT 8.1 7.2  ALBUMIN 4.1 3.7    TUMOR MARKERS: No results for input(s): AFPTM, CEA, CA199, CHROMGRNA in the last 8760 hours.  Assessment and Plan: Liver/Biliary mass  Will proceed with CT guided biopsy today by Dr. Annamaria Boots.  Risks and benefits of biopsy was discussed with the patient and/or patient's family including, but not limited to bleeding, infection, damage to adjacent structures or low yield requiring additional tests.  All of the questions were answered and there is agreement to proceed.  Consent signed and in chart.  Thank you for this interesting consult.  I greatly enjoyed meeting Nicole Ibarra and look forward to participating in their care.  A copy of this report was sent to the requesting provider on this date.  Electronically Signed: Murrell Redden, PA-C   01/31/2019, 9:37 AM      I spent a total of    25 Minutes in face to face in clinical consultation, greater than 50% of which was counseling/coordinating care for liver mass biopsy.

## 2019-01-31 NOTE — Procedures (Signed)
Liver mets, unknown primary  S/p CT RT LIVER LESION BX  No comp Stable ebl 0 Path pending Full report in pacs

## 2019-02-02 ENCOUNTER — Other Ambulatory Visit (HOSPITAL_COMMUNITY): Payer: Self-pay | Admitting: Nurse Practitioner

## 2019-02-08 ENCOUNTER — Inpatient Hospital Stay (HOSPITAL_COMMUNITY): Payer: Medicare Other | Attending: Hematology | Admitting: Hematology

## 2019-02-08 ENCOUNTER — Encounter (HOSPITAL_COMMUNITY): Payer: Self-pay | Admitting: Hematology

## 2019-02-08 ENCOUNTER — Other Ambulatory Visit: Payer: Self-pay

## 2019-02-08 DIAGNOSIS — Z803 Family history of malignant neoplasm of breast: Secondary | ICD-10-CM | POA: Insufficient documentation

## 2019-02-08 DIAGNOSIS — G893 Neoplasm related pain (acute) (chronic): Secondary | ICD-10-CM | POA: Insufficient documentation

## 2019-02-08 DIAGNOSIS — Z5111 Encounter for antineoplastic chemotherapy: Secondary | ICD-10-CM | POA: Diagnosis present

## 2019-02-08 DIAGNOSIS — F17210 Nicotine dependence, cigarettes, uncomplicated: Secondary | ICD-10-CM | POA: Insufficient documentation

## 2019-02-08 DIAGNOSIS — Z8042 Family history of malignant neoplasm of prostate: Secondary | ICD-10-CM | POA: Diagnosis not present

## 2019-02-08 DIAGNOSIS — Z800 Family history of malignant neoplasm of digestive organs: Secondary | ICD-10-CM | POA: Diagnosis not present

## 2019-02-08 DIAGNOSIS — I10 Essential (primary) hypertension: Secondary | ICD-10-CM | POA: Insufficient documentation

## 2019-02-08 DIAGNOSIS — K829 Disease of gallbladder, unspecified: Secondary | ICD-10-CM | POA: Diagnosis not present

## 2019-02-08 DIAGNOSIS — K5900 Constipation, unspecified: Secondary | ICD-10-CM | POA: Diagnosis not present

## 2019-02-08 DIAGNOSIS — F419 Anxiety disorder, unspecified: Secondary | ICD-10-CM | POA: Diagnosis not present

## 2019-02-08 DIAGNOSIS — R530 Neoplastic (malignant) related fatigue: Secondary | ICD-10-CM | POA: Insufficient documentation

## 2019-02-08 DIAGNOSIS — Z79899 Other long term (current) drug therapy: Secondary | ICD-10-CM | POA: Insufficient documentation

## 2019-02-08 DIAGNOSIS — E876 Hypokalemia: Secondary | ICD-10-CM | POA: Insufficient documentation

## 2019-02-08 DIAGNOSIS — C787 Secondary malignant neoplasm of liver and intrahepatic bile duct: Secondary | ICD-10-CM

## 2019-02-08 DIAGNOSIS — C221 Intrahepatic bile duct carcinoma: Secondary | ICD-10-CM | POA: Diagnosis present

## 2019-02-08 NOTE — Assessment & Plan Note (Addendum)
1.  Cholangiocarcinoma: - Presentation to the ER on 12/29/2018 with on and off abdominal pain, nausea, constipation for the last 1 year.  No weight loss.  - Colonoscopy on 10/23/2016, 6 mm tubular adenoma in the mid sigmoid colon, internal and external hemorrhoids. -Mammogram on 07/03/2015, BI-RADS 1.  Patient smokes 1 pack of cigarettes over 2 weeks since age 63. -CT scan of the abdomen and pelvis with contrast on 12/29/2018 showed a 1.6 x 3.3 cm infiltrative mass within the liver abutting the gallbladder fundus.  There is mild dilation of the biliary tree.  There is a 3.2 x 3.2 cm confluent mass/enlarged lymph node in the porta hepatis.  No other abnormalities were seen. - PET CT scan on 01/17/2019 shows hepatic hypermetabolism corresponding to gallbladder fundus with SUV of 9.1.  Focus of central right hepatic lobe hypermetabolism likely corresponds to the site of intrahepatic ductal obstruction on the prior diagnostic CT with SUV of 8.3.  Hypermetabolism corresponding to the porta hepatis presumed nodal mass measuring 3.3 cm. - Core biopsy of the right liver mass consistent with well-differentiated adenocarcinoma, positive for CK7 and CDX 2.  CK20 and TTF-1 are negative.  Differential diagnosis includes an upper GI/pancreaticobiliary primary. - We had prolonged discussion about diagnosis and normal prognosis of advanced cholangiocarcinoma.  Treatment intent is palliative. - I have recommended palliative chemotherapy with gemcitabine and cisplatin (day 1 and 8 every 21 days) based regimen.  We talked about the regimen and side effects in detail. -She will need a port placement for chemotherapy.  We will make a referral to 1 of our surgeons. -We will send tumor for foundation 1 testing as cholangiocarcinomas have a high incidence of IDH 1, FGF amplification, BRAF and other mutations.  2.  Family history: -Brother had colon cancer.  Sister had breast cancer and father had prostate cancer. -Patient lives  by herself and does not have any children.  Her sister lives next door.

## 2019-02-08 NOTE — Progress Notes (Signed)
[place], [place] [address]   CLINIC:  Medical Oncology/Hematology  PCP:  Cory Munch, PA-C [place] [address] [phone]   REASON FOR VISIT: Follow-up for adenocarcinoma of the liver and gallbladder mass with lymph node involvement   CURRENT THERAPY: Cisplatin/gemcitabine days 1, 8 every 21 days.  INTERVAL HISTORY:  Nicole Ibarra 63 y.o. female returns for routine follow-up for adenocarcinoma of the liver. She is here to get results of her biopsy today. She will have her port palced then start treatment. Denies any nausea, vomiting, or diarrhea. Denies any new pains. Had not noticed any recent bleeding such as epistaxis, hematuria or hematochezia. Denies recent chest pain on exertion, shortness of breath on minimal exertion, pre-syncopal episodes, or palpitations. Denies any numbness or tingling in hands or feet. Denies any recent fevers, infections, or recent hospitalizations. Patient reports appetite at 25% and energy level at 0%. She is trying to increase her food intake.     REVIEW OF SYSTEMS:  Review of Systems  Gastrointestinal: Positive for constipation.  Psychiatric/Behavioral: The patient is nervous/anxious.   All other systems reviewed and are negative.    PAST MEDICAL/SURGICAL HISTORY:  Past Medical History:  Diagnosis Date  . Anxiety   . Arthritis   . Depression   . Hypertension    Past Surgical History:  Procedure Laterality Date  . COLONOSCOPY N/A 10/23/2016   Procedure: COLONOSCOPY;  Surgeon: Danie Binder, MD;  [facility];  Service: Endoscopy;  Laterality: N/A;  11:30 Am  . POLYPECTOMY  10/23/2016   Procedure: POLYPECTOMY;  Surgeon: Danie Binder, MD;  [facility];  Service: Endoscopy;;  sigmoid colon polyp  . RT HIP SURGERY    . TOTAL HIP ARTHROPLASTY Left 04/07/2016   Procedure: LEFT TOTAL HIP ARTHROPLASTY ANTERIOR APPROACH;  Surgeon: Paralee Cancel, MD;   Location: [facility];  Service: Orthopedics;  Laterality: Left;  Unsuccessful for Spinal, went to General     SOCIAL HISTORY:  Social History   Socioeconomic History  . Marital status: Single    Spouse name: Not on file  . Number of children: Not on file  . Years of education: Not on file  . Highest education level: Not on file  Occupational History  . Not on file  Social Needs  . Financial resource strain: Not hard at all  . Food insecurity:    Worry: Never true    Inability: Never true  . Transportation needs:    Medical: No    Non-medical: No  Tobacco Use  . Smoking status: Current Every Day Smoker    Packs/day: 0.25    Years: 42.00    Pack years: 10.50  . Smokeless tobacco: Never Used  Substance and Sexual Activity  . Alcohol use: No  . Drug use: No  . Sexual activity: Not Currently  Lifestyle  . Physical activity:    Days per week: 0 days    Minutes per session: 0 min  . Stress: Only a little  Relationships  . Social connections:    Talks on phone: Three times a week    Gets together: Three times a week    Attends religious service: More than 4 times per year    Active member of club or organization: Yes    Attends meetings of clubs or organizations: More than 4 times per year    Relationship status: Never married  . Intimate partner violence:  Fear of current or ex partner: No    Emotionally abused: No    Physically abused: No    Forced sexual activity: No  Other Topics Concern  . Not on file  Social History Narrative  . Not on file    FAMILY HISTORY:  Family History  Problem Relation Age of Onset  . Hypertension Mother   . Cancer Father   . Hypertension Brother   . Cancer Brother   . Hypertension Sister   . Cancer Sister     CURRENT MEDICATIONS:  Outpatient Encounter Medications as of 02/08/2019  Medication Sig  . amLODIPine-Valsartan-HCTZ 10-320-25 MG TABS Take 1 tablet by mouth daily.  Marland Kitchen atorvastatin (LIPITOR) 20 MG tablet Take 20 mg by  mouth daily.  Marland Kitchen doxazosin (CARDURA) 2 MG tablet Take 2 mg by mouth every evening.   . vitamin B-12 (CYANOCOBALAMIN) 50 MCG tablet Take 50 mcg by mouth daily.   No facility-administered encounter medications on file as of 02/08/2019.     ALLERGIES:  No Known Allergies   PHYSICAL EXAM:  ECOG Performance status: 1  Vitals:   02/08/19 0917  BP: 139/69  Pulse: 72  Resp: 12  Temp: 98.1 F (36.7 C)  SpO2: 100%   Filed Weights   02/08/19 0917  Weight: 211 lb 3.2 oz (95.8 kg)    Physical Exam Constitutional:      Appearance: Normal appearance. She is normal weight.  Cardiovascular:     Rate and Rhythm: Normal rate and regular rhythm.     Heart sounds: Normal heart sounds.  Pulmonary:     Effort: Pulmonary effort is normal.     Breath sounds: Normal breath sounds.  Musculoskeletal: Normal range of motion.  Skin:    General: Skin is warm and dry.  Neurological:     Mental Status: She is alert and oriented to person, place, and time. Mental status is at baseline.  Psychiatric:        Mood and Affect: Mood normal.        Behavior: Behavior normal.        Thought Content: Thought content normal.        Judgment: Judgment normal.      LABORATORY DATA:  I have reviewed the labs as listed.  CBC    Component Value Date/Time   WBC 4.0 01/31/2019 0755   RBC 3.34 (L) 01/31/2019 0755   HGB 10.5 (L) 01/31/2019 0755   HCT 31.5 (L) 01/31/2019 0755   PLT 150 01/31/2019 0755   MCV 94.3 01/31/2019 0755   MCH 31.4 01/31/2019 0755   MCHC 33.3 01/31/2019 0755   RDW 13.8 01/31/2019 0755   LYMPHSABS 1.6 01/31/2019 0755   MONOABS 0.3 01/31/2019 0755   EOSABS 0.2 01/31/2019 0755   BASOSABS 0.0 01/31/2019 0755   CMP Latest Ref Rng & Units 01/31/2019 12/29/2018 04/08/2016  Glucose 70 - 99 mg/dL 114(H) 106(H) 174(H)  BUN 8 - 23 mg/dL '17 20 14  '$ Creatinine 0.44 - 1.00 mg/dL 1.04(H) 1.18(H) 1.00  Sodium 135 - 145 mmol/L 139 139 140  Potassium 3.5 - 5.1 mmol/L 2.8(L) 3.7 4.1  Chloride 98  - 111 mmol/L 103 101 106  CO2 22 - 32 mmol/L '27 28 25  '$ Calcium 8.9 - 10.3 mg/dL 9.4 10.3 9.2  Total Protein 6.5 - 8.1 g/dL 7.2 8.1 -  Total Bilirubin 0.3 - 1.2 mg/dL 0.8 0.9 -  Alkaline Phos 38 - 126 U/L 233(H) 201(H) -  AST 15 - 41 U/L 45(H) 49(H) -  ALT 0 - 44 U/L 65(H) 79(H) -       DIAGNOSTIC IMAGING:  I have independently reviewed the scans and discussed with the patient.   I have reviewed Francene Finders, NP's note and agree with the documentation.  I personally performed a face-to-face visit, made revisions and my assessment and plan is as follows.    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  Cholangiocarcinoma: - Presentation to the ER on 12/29/2018 with on and off abdominal pain, nausea, constipation for the last 1 year.  No weight loss.  - Colonoscopy on 10/23/2016, 6 mm tubular adenoma in the mid sigmoid colon, internal and external hemorrhoids. -Mammogram on 07/03/2015, BI-RADS 1.  Patient smokes 1 pack of cigarettes over 2 weeks since age 31. -CT scan of the abdomen and pelvis with contrast on 12/29/2018 showed a 1.6 x 3.3 cm infiltrative mass within the liver abutting the gallbladder fundus.  There is mild dilation of the biliary tree.  There is a 3.2 x 3.2 cm confluent mass/enlarged lymph node in the porta hepatis.  No other abnormalities were seen. - PET CT scan on 01/17/2019 shows hepatic hypermetabolism corresponding to gallbladder fundus with SUV of 9.1.  Focus of central right hepatic lobe hypermetabolism likely corresponds to the site of intrahepatic ductal obstruction on the prior diagnostic CT with SUV of 8.3.  Hypermetabolism corresponding to the porta hepatis presumed nodal mass measuring 3.3 cm. - Core biopsy of the right liver mass consistent with well-differentiated adenocarcinoma, positive for CK7 and CDX 2.  CK20 and TTF-1 are negative.  Differential diagnosis includes an upper GI/pancreaticobiliary primary. - We had prolonged  discussion about diagnosis and normal prognosis of advanced cholangiocarcinoma.  Treatment intent is palliative. - I have recommended palliative chemotherapy with gemcitabine and cisplatin based regimen. -We will send tumor for foundation 1 testing as cholangiocarcinomas have a high incidence of IDH 1, FGF amplification, BRAF and other mutations.  2.  Family history: -Brother had colon cancer.  Sister had breast cancer and father had prostate cancer. -Patient lives by herself and does not have any children.  Her sister lives next door.      Orders placed this encounter:  No orders of the defined types were placed in this encounter.     Derek Jack, MD [facility] [phone]

## 2019-02-08 NOTE — Progress Notes (Signed)
START OFF PATHWAY REGIMEN - [Other Dx]   [id number]:Cisplatin 25 mg/m2 D1,8 + Gemcitabine 1,000 mg/m2 D1,8 q21 Days:   A cycle is every 21 days:     Gemcitabine      Cisplatin   **Always confirm dose/schedule in your pharmacy ordering system**  Patient Characteristics: Intent of Therapy: Non-Curative / Palliative Intent, Discussed with Patient 

## 2019-02-08 NOTE — Progress Notes (Signed)
Oncology navigator note:  I met with patient during office visit with Dr. Delton Coombes.  I introduced myself and explained my role in her care. I provided the patient with my contact information.  I explained some basic information about chemotherapy and told her that she would get a detailed teaching session before she starts treatment.  I explained the general flow of the first day of treatment.  I gave patient time to ask questions and they were answered to her satisfaction.  Patient did express that she wants her sister to know what is going on. I gave her another card with my contact information and told her to have her sister call me and I would do my best to also explain it to her.  She verbalizes understanding and appreciation.      I spoke with Suanne Marker in pathology and ordered foundation one on accession # [id number]. 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

## 2019-02-08 NOTE — Patient Instructions (Addendum)
Nicole Ibarra at [facility] Discharge Instructions  You were seen today by Dr. Delton Coombes, he went over your recent biopsy results and discussed special testing to check for mutations. We will send for the special testing. He went over the chemotherapy medications and their side effects. He also discussed that your cancer is treatable but not curable. He discussed getting you scheduled for port placement and it's importance for your treatments. You will need to get labs drawn prior to coming up for every treatment. We will get you scheduled for your treatment once you have your port placed.    Thank you for choosing [place] at [facility] to provide your oncology and hematology care.  To afford each patient quality time with our provider, please arrive at least 15 minutes before your scheduled appointment time.   If you have a lab appointment with the [facility] please come in thru the  Main Entrance and check in at the main information desk  You need to re-schedule your appointment should you arrive 10 or more minutes late.  We strive to give you quality time with our providers, and arriving late affects you and other patients whose appointments are after yours.  Also, if you no show three or more times for appointments you may be dismissed from the clinic at the providers discretion.     Again, thank you for choosing [facility]  Our hope is that these requests will decrease the amount of time that you wait before being seen by our physicians.       _____________________________________________________________  Should you have questions after your visit to [facility], please contact our office at [phone] between the hours of 8:00 a.m. and 4:30 p.m.  Voicemails left after 4:00 p.m. will not be returned until the following business day.  For prescription refill requests, have your pharmacy contact our office and allow  72 hours.    Cancer Center Support Programs:   > Cancer Support Group  2nd Tuesday of the month 1pm-2pm, Journey Room

## 2019-02-09 ENCOUNTER — Encounter: Payer: Self-pay | Admitting: General Surgery

## 2019-02-09 ENCOUNTER — Ambulatory Visit: Payer: Medicare Other | Admitting: General Surgery

## 2019-02-09 VITALS — BP 132/76 | HR 64 | Temp 96.9°F | Resp 18 | Wt 211.0 lb

## 2019-02-09 DIAGNOSIS — C221 Intrahepatic bile duct carcinoma: Secondary | ICD-10-CM

## 2019-02-09 DIAGNOSIS — C787 Secondary malignant neoplasm of liver and intrahepatic bile duct: Secondary | ICD-10-CM

## 2019-02-09 NOTE — Patient Instructions (Signed)
Implanted Port Home Guide An implanted port is a device that is placed under the skin. It is usually placed in the chest. The device can be used to give IV medicine, to take blood, or for dialysis. You may have an implanted port if:  You need IV medicine that would be irritating to the small veins in your hands or arms.  You need IV medicines, such as antibiotics, for a long period of time.  You need IV nutrition for a long period of time.  You need dialysis. Having a port means that your health care provider will not need to use the veins in your arms for these procedures. You may have fewer limitations when using a port than you would if you used other types of long-term IVs, and you will likely be able to return to normal activities after your incision heals. An implanted port has two main parts:  Reservoir. The reservoir is the part where a needle is inserted to give medicines or draw blood. The reservoir is round. After it is placed, it appears as a small, raised area under your skin.  Catheter. The catheter is a thin, flexible tube that connects the reservoir to a vein. Medicine that is inserted into the reservoir goes into the catheter and then into the vein. How is my port accessed? To access your port:  A numbing cream may be placed on the skin over the port site.  Your health care provider will put on a mask and sterile gloves.  The skin over your port will be cleaned carefully with a germ-killing soap and allowed to dry.  Your health care provider will gently pinch the port and insert a needle into it.  Your health care provider will check for a blood return to make sure the port is in the vein and is not clogged.  If your port needs to remain accessed to get medicine continuously (constant infusion), your health care provider will place a clear bandage (dressing) over the needle site. The dressing and needle will need to be changed every week, or as told by your health care  provider. What is flushing? Flushing helps keep the port from getting clogged. Follow instructions from your health care provider about how and when to flush the port. Ports are usually flushed with saline solution or a medicine called heparin. The need for flushing will depend on how the port is used:  If the port is only used from time to time to give medicines or draw blood, the port may need to be flushed: ? Before and after medicines have been given. ? Before and after blood has been drawn. ? As part of routine maintenance. Flushing may be recommended every 4-6 weeks.  If a constant infusion is running, the port may not need to be flushed.  Throw away any syringes in a disposal container that is meant for sharp items (sharps container). You can buy a sharps container from a pharmacy, or you can make one by using an empty hard plastic bottle with a cover. How long will my port stay implanted? The port can stay in for as long as your health care provider thinks it is needed. When it is time for the port to come out, a surgery will be done to remove it. The surgery will be similar to the procedure that was done to put the port in. Follow these instructions at home:   Flush your port as told by your health care provider.    If you need an infusion over several days, follow instructions from your health care provider about how to take care of your port site. Make sure you: ? Wash your hands with soap and water before you change your dressing. If soap and water are not available, use alcohol-based hand sanitizer. ? Change your dressing as told by your health care provider. ? Place any used dressings or infusion bags into a plastic bag. Throw that bag in the trash. ? Keep the dressing that covers the needle clean and dry. Do not get it wet. ? Do not use scissors or sharp objects near the tube. ? Keep the tube clamped, unless it is being used.  Check your port site every day for signs of  infection. Check for: ? Redness, swelling, or pain. ? Fluid or blood. ? Pus or a bad smell.  Protect the skin around the port site. ? Avoid wearing bra straps that rub or irritate the site. ? Protect the skin around your port from seat belts. Place a soft pad over your chest if needed.  Bathe or shower as told by your health care provider. The site may get wet as long as you are not actively receiving an infusion.  Return to your normal activities as told by your health care provider. Ask your health care provider what activities are safe for you.  Carry a medical alert card or wear a medical alert bracelet at all times. This will let health care providers know that you have an implanted port in case of an emergency. Get help right away if:  You have redness, swelling, or pain at the port site.  You have fluid or blood coming from your port site.  You have pus or a bad smell coming from the port site.  You have a fever. Summary  Implanted ports are usually placed in the chest for long-term IV access.  Follow instructions from your health care provider about flushing the port and changing bandages (dressings).  Take care of the area around your port by avoiding clothing that puts pressure on the area, and by watching for signs of infection.  Protect the skin around your port from seat belts. Place a soft pad over your chest if needed.  Get help right away if you have a fever or you have redness, swelling, pain, drainage, or a bad smell at the port site. This information is not intended to replace advice given to you by your health care provider. Make sure you discuss any questions you have with your health care provider. Document Released: 11/23/2005 Document Revised: 12/26/2016 Document Reviewed: 12/26/2016 Elsevier Interactive Patient Education  2019 [employer].   Constipation, Adult Constipation is when a person:  Poops (has a bowel movement) fewer times in a week than  normal.  Has a hard time pooping.  Has poop that is dry, hard, or bigger than normal. Follow these instructions at home: Eating and drinking   Eat foods that have a lot of fiber, such as: ? Fresh fruits and vegetables. ? Whole grains. ? Beans.  Eat less of foods that are high in fat, low in fiber, or overly processed, such as: ? [place] fries. ? Hamburgers. ? Cookies. ? Candy. ? Soda.  Drink enough fluid to keep your pee (urine) clear or pale yellow. At least 64 ounces of water a day.  General instructions  Exercise regularly or as told by your doctor.  Go to the restroom when you feel like you need  to poop. Do not hold it in.  Take over-the-counter and prescription medicines only as told by your doctor. These include any fiber supplements.  Do pelvic floor retraining exercises, such as: ? Doing deep breathing while relaxing your lower belly (abdomen). ? Relaxing your pelvic floor while pooping.  Watch your condition for any changes.  Keep all follow-up visits as told by your doctor. This is important. Contact a doctor if:  You have pain that gets worse.  You have a fever.  You have not pooped for 4 days.  You throw up (vomit).  You are not hungry.  You lose weight.  You are bleeding from the anus.  You have thin, pencil-like poop (stool). Get help right away if:  You have a fever, and your symptoms suddenly get worse.  You leak poop or have blood in your poop.  Your belly feels hard or bigger than normal (is bloated).  You have very bad belly pain.  You feel dizzy or you faint. This information is not intended to replace advice given to you by your health care provider. Make sure you discuss any questions you have with your health care provider. Document Released: 05/11/2008 Document Revised: 06/12/2016 Document Reviewed: 05/13/2016 Elsevier Interactive Patient Education  2019 [employer].

## 2019-02-09 NOTE — Progress Notes (Signed)
[facility] History and Physical  Reason for Referral: Port placement, Cholangiocarcinoma  Referring Physician:  Dr. Hetty Ely is a 63 y.o. female.  HPI: Ms. 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 She presented to the ED 12/29/2018 with abdominal pain, nausea and constipation and was found to have a liver mass abutting the gallbladder on CT a/p.  This was biopsied and demonstrated well differentiated adenocarcinoma.  She has been seen by Oncology and they are recommending palliative chemotherapy due to the prognosis and advanced stage of her cholangiocarcinoma.  She has been in agreement with this plan. She reports she is continuing to have some right sided abdominal pain, nausea and constipation. She reports no prior central access, and is not on any blood thinners.   Past Medical History:  Diagnosis Date  . Anxiety   . Arthritis   . Depression   . Hypertension     Past Surgical History:  Procedure Laterality Date  . COLONOSCOPY N/A 10/23/2016   Procedure: COLONOSCOPY;  Surgeon: Danie Binder, MD;  Location: [facility];  Service: Endoscopy;  Laterality: N/A;  11:30 Am  . POLYPECTOMY  10/23/2016   Procedure: POLYPECTOMY;  Surgeon: Danie Binder, MD;  Location: [facility];  Service: Endoscopy;;  sigmoid colon polyp  . RT HIP SURGERY    . TOTAL HIP ARTHROPLASTY Left 04/07/2016   Procedure: LEFT TOTAL HIP ARTHROPLASTY ANTERIOR APPROACH;  Surgeon: Paralee Cancel, MD;  Location: [facility];  Service: Orthopedics;  Laterality: Left;  Unsuccessful for Spinal, went to General    Family History  Problem Relation Age of Onset  . Hypertension Mother   . Cancer Father   . Hypertension Brother   . Cancer Brother   . Hypertension Sister   . Cancer Sister     Social History   Tobacco Use  . Smoking status: Current Every Day Smoker    Packs/day: 0.25    Years: 42.00    Pack years: 10.50  . Smokeless tobacco: Never Used  Substance  Use Topics  . Alcohol use: No  . Drug use: No    Medications: I have reviewed the patient's current medications. Prior to Admission: (Not in a hospital admission)  Scheduled: Continuous: PRN: Allergies as of 02/09/2019   No Known Allergies     Medication List       Accurate as of February 09, 2019  2:08 PM. Always use your most recent med list.        amLODIPine-Valsartan-HCTZ 10-320-25 MG Tabs Take 1 tablet by mouth daily.   atorvastatin 20 MG tablet Commonly known as:  LIPITOR Take 20 mg by mouth daily.   doxazosin 2 MG tablet Commonly known as:  CARDURA Take 2 mg by mouth every evening.   naproxen sodium 220 MG tablet Commonly known as:  ALEVE Take 220 mg by mouth.   vitamin B-12 50 MCG tablet Commonly known as:  CYANOCOBALAMIN Take 50 mcg by mouth daily.        ROS:  A comprehensive review of systems was negative except for: Gastrointestinal: positive for abdominal pain, constipation and nausea  Blood pressure 132/76, pulse 64, temperature (!) 96.9 F (36.1 C), temperature source Temporal, resp. rate 18, weight 211 lb (95.7 kg). Physical Exam Vitals signs reviewed.  Constitutional:      Appearance: Normal appearance. She is normal weight.  HENT:     Head: Normocephalic and atraumatic.     Nose: Nose normal.  Mouth/Throat:     Mouth: Mucous membranes are moist.  Eyes:     Pupils: Pupils are equal, round, and reactive to light.  Neck:     Musculoskeletal: Normal range of motion.  Cardiovascular:     Rate and Rhythm: Normal rate and regular rhythm.  Pulmonary:     Effort: Pulmonary effort is normal.     Breath sounds: Normal breath sounds.  Abdominal:     General: There is no distension.     Palpations: Abdomen is soft.     Tenderness: There is abdominal tenderness.     Comments: Fullness in the right side, some tenderness  Musculoskeletal: Normal range of motion.        General: No swelling.  Skin:    General: Skin is warm and dry.    Neurological:     General: No focal deficit present.     Mental Status: She is alert and oriented to person, place, and time.  Psychiatric:        Mood and Affect: Mood normal.        Behavior: Behavior normal.        Thought Content: Thought content normal.        Judgment: Judgment normal.     Results: 2/11 PET IMPRESSION: 1. Two foci of hepatic hypermetabolism. The pericholecystic liver lesion may represent direct extension of a gallbladder primary. The adjacent, central right hepatic lobe lesion could represent a metastasis or a primary cholangiocarcinoma. 2. Porta hepatis hypermetabolic adenopathy, likely indicative of metastatic disease. 3. No extrahepatic hypermetabolic primary or metastatic disease Identified.  12/29/2018 CT a/p  IMPRESSION: 1. 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 Clinical correlation and tissue sampling recommended. 2. Lobulated mass versus metastatic lymph node in the porta pedis. 3. No bowel obstruction or active inflammation. Normal appendix. 4. Myomatous uterus.  Assessment & Plan:  AJANE NOVELLA is a 63 y.o. female with cholangiocarcinoma who is going to undergo palliative chemotherapy. She will need a port a catheter for this treatment. The patient is in agreement.   OR for port placement  Will start on the left as the patient is right handed   All questions were answered to the satisfaction of the patient and family.  The risk and benefits of port a catheter placement were discussed including but not limited to bleeding, infection, malfunction, injury to the artery, pneumothorax.  After careful consideration, FARHIYA ROSTEN has decided to proceed.    Virl Cagey 02/09/2019, 2:08 PM

## 2019-02-10 NOTE — H&P (Signed)
[facility] History and Physical  Reason for Referral: Port placement, Cholangiocarcinoma  Referring Physician:  Dr. Hetty Ely is a 63 y.o. female.  HPI: Ms. Reily is a 63 yo with newly diagnosed cholangiocarcinoma. She presented to the ED 12/29/2018 with abdominal pain, nausea and constipation and was found to have a liver mass abutting the gallbladder on CT a/p.  This was biopsied and demonstrated well differentiated adenocarcinoma.  She has been seen by Oncology and they are recommending palliative chemotherapy due to the prognosis and advanced stage of her cholangiocarcinoma.  She has been in agreement with this plan. She reports she is continuing to have some right sided abdominal pain, nausea and constipation. She reports no prior central access, and is not on any blood thinners.       Past Medical History:  Diagnosis Date  . Anxiety   . Arthritis   . Depression   . Hypertension          Past Surgical History:  Procedure Laterality Date  . COLONOSCOPY N/A 10/23/2016   Procedure: COLONOSCOPY;  Surgeon: Danie Binder, MD;  Location: [facility];  Service: Endoscopy;  Laterality: N/A;  11:30 Am  . POLYPECTOMY  10/23/2016   Procedure: POLYPECTOMY;  Surgeon: Danie Binder, MD;  Location: [facility];  Service: Endoscopy;;  sigmoid colon polyp  . RT HIP SURGERY    . TOTAL HIP ARTHROPLASTY Left 04/07/2016   Procedure: LEFT TOTAL HIP ARTHROPLASTY ANTERIOR APPROACH;  Surgeon: Paralee Cancel, MD;  Location: [facility];  Service: Orthopedics;  Laterality: Left;  Unsuccessful for Spinal, went to General         Family History  Problem Relation Age of Onset  . Hypertension Mother   . Cancer Father   . Hypertension Brother   . Cancer Brother   . Hypertension Sister   . Cancer Sister     Social History        Tobacco Use  . Smoking status: Current Every Day Smoker    Packs/day: 0.25    Years: 42.00    Pack  years: 10.50  . Smokeless tobacco: Never Used  Substance Use Topics  . Alcohol use: No  . Drug use: No    Medications: I have reviewed the patient's current medications. Prior to Admission: (Not in a hospital admission)  Scheduled: Continuous: PRN: Allergies as of 02/09/2019   No Known Allergies        Medication List       Accurate as of February 09, 2019  2:08 PM. Always use your most recent med list.        amLODIPine-Valsartan-HCTZ 10-320-25 MG Tabs Take 1 tablet by mouth daily.   atorvastatin 20 MG tablet Commonly known as:  LIPITOR Take 20 mg by mouth daily.   doxazosin 2 MG tablet Commonly known as:  CARDURA Take 2 mg by mouth every evening.   naproxen sodium 220 MG tablet Commonly known as:  ALEVE Take 220 mg by mouth.   vitamin B-12 50 MCG tablet Commonly known as:  CYANOCOBALAMIN Take 50 mcg by mouth daily.        ROS:  A comprehensive review of systems was negative except for: Gastrointestinal: positive for abdominal pain, constipation and nausea  Blood pressure 132/76, pulse 64, temperature (!) 96.9 F (36.1 C), temperature source Temporal, resp. rate 18, weight 211 lb (95.7 kg). Physical Exam Vitals signs reviewed.  Constitutional:      Appearance: Normal appearance.  She is normal weight.  HENT:     Head: Normocephalic and atraumatic.     Nose: Nose normal.     Mouth/Throat:     Mouth: Mucous membranes are moist.  Eyes:     Pupils: Pupils are equal, round, and reactive to light.  Neck:     Musculoskeletal: Normal range of motion.  Cardiovascular:     Rate and Rhythm: Normal rate and regular rhythm.  Pulmonary:     Effort: Pulmonary effort is normal.     Breath sounds: Normal breath sounds.  Abdominal:     General: There is no distension.     Palpations: Abdomen is soft.     Tenderness: There is abdominal tenderness.     Comments: Fullness in the right side, some tenderness  Musculoskeletal: Normal range of motion.         General: No swelling.  Skin:    General: Skin is warm and dry.  Neurological:     General: No focal deficit present.     Mental Status: She is alert and oriented to person, place, and time.  Psychiatric:        Mood and Affect: Mood normal.        Behavior: Behavior normal.        Thought Content: Thought content normal.        Judgment: Judgment normal.     Results: 2/11 PET IMPRESSION: 1. Two foci of hepatic hypermetabolism. The pericholecystic liver lesion may represent direct extension of a gallbladder primary. The adjacent, central right hepatic lobe lesion could represent a metastasis or a primary cholangiocarcinoma. 2. Porta hepatis hypermetabolic adenopathy, likely indicative of metastatic disease. 3. No extrahepatic hypermetabolic primary or metastatic disease Identified.  12/29/2018 CT a/p  IMPRESSION: 1. 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 Clinical correlation and tissue sampling recommended. 2. Lobulated mass versus metastatic lymph node in the porta pedis. 3. No bowel obstruction or active inflammation. Normal appendix. 4. Myomatous uterus.  Assessment & Plan:  JORDI KAMM is a 63 y.o. female with cholangiocarcinoma who is going to undergo palliative chemotherapy. She will need a port a catheter for this treatment. The patient is in agreement.   OR for port placement  Will start on the left as the patient is right handed   All questions were answered to the satisfaction of the patient and family.  The risk and benefits of port a catheter placement were discussed including but not limited to bleeding, infection, malfunction, injury to the artery, pneumothorax.  After careful consideration, KALILAH BARUA has decided to proceed.    Virl Cagey 02/09/2019, 2:08 PM

## 2019-02-11 MED ORDER — LIDOCAINE-PRILOCAINE 2.5-2.5 % EX CREA: TOPICAL_CREAM | CUTANEOUS | 3 refills | Status: DC

## 2019-02-11 MED ORDER — PROCHLORPERAZINE MALEATE 10 MG PO TABS: 10.0000 mg | ORAL_TABLET | Freq: Four times a day (QID) | ORAL | 1 refills | Status: DC | PRN

## 2019-02-11 NOTE — Patient Instructions (Addendum)
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  The medications you will receive to treat this cancer are cisplatin and gemcitabine (Gemzar).  You will receive this chemotherapy regimen on Days 1 and 8 every 21 days, which means you will come for treatment 2 weeks in a row, then have 1 week off prior to having your next treatment.  The intent of this treatment is palliative, meaning you will be receiving chemotherapy to help control this cancer and help alleviate any symptoms you may be experiencing because of the cancer.  You will see the doctor regularly throughout treatment.  We monitor your lab work prior to every treatment. The doctor monitors your response to treatment by the way you are feeling, your blood work, and scans periodically.  There will be wait times while you are here for treatment.  It will take about 30 minutes to 1 hour for your lab work to result.  Then there will be wait times while pharmacy mixes your medications.   Medications you will receive in the clinic prior to your chemotherapy medications:  Aloxi:  ALOXI is a medicine called an "antiemetic."   ALOXI is used in adults to help prevent the  nausea and vomiting that happens with certain anti-cancer medicines (chemotherapy).  Aloxi is a long acting medication, and will remain in your system for 24-36 hours.   Emend:  This is an anti-nausea medication that is used with Aloxi to help prevent nausea and vomiting caused by chemotherapy.  Dexamethasone:  This is a steroid given prior to chemotherapy to help prevent allergic reactions; it may also help prevent and control nausea and diarrhea.    Cisplatin (Generic Name) Other Names: Platinol, platinum  About This Drug Cisplatin is a drug used to treat cancer. This drug is given in the vein (IV).  This will take 1 hour to infuse.  With this drug you will receive 2 hours of pre-hydration and 1 hour of post  hydration.  This is to help protect your kidneys.  You will have to urinate 200 mL prior to receiving this medication.  We will give you something to measure your urine in.   Possible Side Effects (More Common) . This drug may affect how your kidneys work. Your kidney function will be checked as needed. . Electrolyte changes. Your blood will be checked for electrolyte changes as needed. . High-frequency hearing loss may occur. You will get IV fluids before and during the Cisplatin infusion to help prevent this. You may also get ringing in the ears. . Bone marrow depression. This is a decrease in the number of white blood cells, red blood cells, and platelets. This may raise your risk of infection, make you tired and weak (fatigue), and raise your risk of bleeding. . Nausea and throwing up (vomiting). These symptoms may happen within a few hours after your treatment and may last for a few days to a week. Medicines are available to stop or lessen these side effects.  Possible Side Effects (Less Common) . Effects on the nerves are called peripheral neuropathy. You may feel numbness or pain in your hands and feet. It may be hard for you to button your clothes, open jars, or walk as usual. The effect on the nerves may get worse with more doses of the drug. These effects get better in some people after the drug is stopped, but it does not get better in all people. Marland Kitchen Blurred  vision or other changes in eyesight. . Soreness of the mouth and throat. You may have red areas, white patches, or sores that hurt. . Hair loss. You may notice your hair getting thin. Some patients lose their hair. Your hair often grows back when treatment is done.  Allergic Reactions Allergic reactions to this drug are rare, but may happen in some patients. Signs of allergic reactions to this drug may be a rash, fever, chills, feeling dizzy, trouble breathing, and/or feeling that your heart is beating in a fast or not normal  way.  Treating Side Effects . Drink 6-8 cups of fluids each day unless your doctor has told you to limit your fluid intake due to some other health problem. A cup is 8 ounces of fluid. If you throw up or have loose bowel movements you should drink more fluids so that you do not become dehydrated (lack water in the body due to losing too much fluid). . If you have numbness and tingling in your hands and feet, be careful when cooking, walking, and handling sharp objects and hot liquids. . Mouth care is very important. Your mouth care should consist of routine, gentle cleaning of your teeth or dentures and rinsing your mouth with a mixture of 1/2 teaspoon of salt in 8 ounces of water or  teaspoon of baking soda in 8 ounces of water. This should be done at least after each meal and at bedtime. . If you have mouth sores, avoid mouthwash that has alcohol. Also avoid alcohol and smoking because they can bother your mouth and throat. . Talk with your nurse about getting a wig before you lose your hair. Also, call the [employer] at 800-ACS-2345 to find out information about the "Look Good, Feel Better" program close to where you live. It is a free program where women getting chemotherapy can learn about wigs, turbans and scarves as well as makeup techniques and skin and nail care.  Food and Drug Interactions There are no known interactions of Cisplatin with food. This drug may interact with other medicines. Tell your doctor and pharmacist about all the medicines and dietary supplements (vitamins, minerals, herbs and others) that you are taking at this time. The safety and use of dietary supplements and alternative diets are often not known. Using these might affect your cancer or interfere with your treatment. Until more is known, you should not use dietary supplements or alternative diets without your cancer doctor's help.  When to Call the Doctor Call your doctor or nurse right away if you have  any of these symptoms: . Rash or itching . Feeling dizzy or lightheaded . Wheezing or trouble breathing . Swelling of the face . Fever of 100.5 F (38 C) or above . Chills . Easy bleeding or bruising . Decreased urine . Weight gain of 5 pounds in one week (fluid retention) . Nausea that stops you from eating or drinking . Throwing up more than 3 times a day  Call your doctor or nurse as soon as possible if you have these symptoms: . Numbness, tingling, decreased feeling or weakness in fingers, toes, arms, or legs . Trouble walking or changes in the way you walk, feeling clumsy when buttoning clothes, opening jars, or other routine hand motions . Blurred vision or other changes in eyesight . Changes in hearing, ringing in the ears . Pain in your mouth or throat that makes it hard to eat or drink . Fatigue that interferes with your daily  activities  Sexual Problems and Reproductive Concerns . Infertility warning: Sexual problems and reproduction concerns may occur. In both men and women, this drug may affect your ability to have children. This cannot be determined before your treatment. Speak with your doctor or nurse if you plan to have children. Ask for information on sperm or egg banking. . In men, this drug may interfere with your ability to make sperm, but it should not change your ability to have sexual relations. . In women, menstrual bleeding may become irregular or stop while you are receiving this drug. Do not assume that you cannot become pregnant if you do not have a menstrual period. . Women may experience signs of menopause like vaginal dryness, itching, and pain during sexual relations . Genetic counseling is available for you to talk about the effects of this drug therapy on future pregnancies. Also, a genetic counselor can look at the possible risk of problems in the unborn baby due to this medicine if an exposure happens during pregnancy.  . Pregnancy warning: The drug may  have harmful effects on the unborn child, so effective methods of birth control should be used during your cancer treatment.  . Breast feeding warning: Women should not breast feed during treatment because this drug could enter the breast milk and badly harm a breast feeding baby.   Gemcitabine (Gemzar)  About This Drug Gemcitabine is used to treat cancer. It is given in the vein (IV).  It will take 30 minutes to infuse.  Possible Side Effects . Bone marrow suppression. This is a decrease in the number of white blood cells, red blood cells, and platelets. This may raise your risk of infection, make you tired and weak (fatigue), and raise your risk of bleeding . Fever . Trouble breathing . Nausea and throwing up (vomiting) . Changes in your liver function . Increased protein in your urine, which can affect how your kidneys work . Blood in your urine . Rash . Swelling of your legs, ankles and/or feet  Note: Each of the side effects above was reported in 20% or greater of patients treated with Gemcitabine. Not all possible side effects are included above.  Warnings and Precautions . Severe bone marrow suppression . Inflammation (swelling) of the lungs and/or thickening of the lung tissues, which may be lifethreatening. You may have a dry cough or trouble breathing. . Changes in your kidney function, which can cause kidney failure . Changes in your liver function, which can cause liver failure and may be life-threatening . If you have received radiation treatments, your skin may become red and/or you may develop soreness of the mouth and throat after gemcitabine. This reaction is called "recall." Your body is recalling, or remembering, that it had radiation therapy. . A syndrome where fluid from your veins can leak into your tissues and cause a decrease in your blood pressure and fluid to accumulate in your tissues and/or lungs. . A syndrome can occur that causes changes to kidney  and liver function in combination with a decrease in red blood cells. Kidney failure may result which may be life-threatening. . Changes in your central nervous system can happen. The central nervous system is made up of your brain and spinal cord. You could feel extreme tiredness, agitation, confusion, hallucinations (see or hear things that are not there), have trouble understanding or speaking, loss of control of your bowels or bladder, eyesight changes, numbness or lack of strength to your arms, legs, face, or body,  seizures or coma. If you start to have any of these symptoms let your doctor know right away.  Note: Some of the side effects above are very rare. If you have concerns and/or questions, please discuss them with your medical team.  Important Information . This drug may be present in the saliva, tears, sweat, urine, stool, vomit, semen, and vaginal secretions. Talk to your doctor and/or your nurse about the necessary precautions to take during this time.  Treating Side Effects . Manage tiredness by pacing your activities for the day. . Be sure to include periods of rest between energy-draining activities. . To decrease the risk of infection, wash your hands regularly. . Avoid close contact with people who have a cold, the flu, or other infections. . Take your temperature as your doctor or nurse tells you, and whenever you feel like you may have a fever. . To help decrease bleeding, use a soft toothbrush. Check with your nurse before using dental floss. . Be very careful when using knives or tools. . Use an electric shaver instead of a razor. . Drink plenty of fluids (a minimum of eight glasses per day is recommended). . If you throw up or have loose bowel movements, you should drink more fluids so that you do not become dehydrated (lack of water in the body from losing too much fluid). . To help with nausea and vomiting, eat small, frequent meals instead of three large  meals a day. Choose foods and drinks that are at room temperature. Ask your nurse or doctor about other helpful tips and medicine that is available to help stop or lessen these symptoms. . If you get a rash do not put anything on it unless your doctor or nurse says you may. Keep the area around the rash clean and dry. Ask your doctor for medicine if your rash bothers you. . If you received radiation, and your skin becomes red or irritated again, or you develop soreness of the mouth and throat, follow the same care instructions you did during radiation treatment. Be sure to tell the nurse or doctor administering your chemotherapy about your skin changes.  Food and Drug Interactions . There are no known interactions of gemcitabine with food. . This drug may interact with other medicines. Tell your doctor and pharmacist about all the prescription and over-the-counter medicines and dietary supplements (vitamins, minerals, herbs and others) that you are taking at this time. Also, check with your doctor or pharmacist before starting any new prescription or over-the-counter medicines, or dietary supplements to make sure that there are no interactions.  When to Call the Doctor Call your doctor or nurse if you have any of these symptoms and/or any new or unusual symptoms: . Fever of 100.4 F (38 C) or higher . Chills . Tiredness that interferes with your daily activities . Feeling dizzy or lightheaded . Pain in your chest . Dry cough . Wheezing and/or trouble breathing . Confusion and/or agitation . Symptoms of a seizure such as confusion, blacking out, passing out, loss of hearing or vision, blurred vision, unusual smells or tastes (such as burning rubber), trouble talking, tremors or shaking in parts or all of the body, repeated body movements, tense muscles that do not relax, and loss of control of urine and bowels. If you or your family member suspects you are having a seizure, call 911  right away. . Hallucinations . Trouble understanding or speaking . Blurry vision or changes in your eyesight . Numbness or  lack of strength to your arms, legs, face, or body . Easy bleeding or bruising . Nausea that stops you from eating or drinking and/or is not relieved by prescribed medicines . Throwing up more than 3 times a day . Swelling of legs, ankles, or feet . Weight gain of 5 pounds in one week (fluid retention) . Blood in urine . Decreased urine or very dark urine . Foamy or bubbly-looking urine . A new rash/itching or a rash that is not relieved by prescribed medicines . Signs of possible liver problems: dark urine, pale bowel movements, bad stomach pain, feeling very tired and weak, unusual itching, or yellowing of the eyes or skin . If you think you may be pregnant or may have impregnated your partner  Reproduction Warnings . Pregnancy warning: This drug can have harmful effects on the unborn baby. Women of childbearing potential should use effective methods of birth control during your cancer treatment and for 6 months after treatment. Men with female partners of childbearing potential should use effective methods of birth control during your cancer treatment and for 3 months after your cancer treatment. Let your doctor know right away if you think you may be pregnant or may have impregnated your partner.  . Breastfeeding warning: Women should not breastfeed during treatment and for 1 week after treatment because this drug could enter the breast milk and cause harm to a breastfeeding baby.  . Fertility warning: In men, this drug may affect your ability to have children in the future. Talk with your doctor or nurse if you plan to have children. Ask for information on sperm banking.  SELF CARE ACTIVITIES WHILE ON CHEMOTHERAPY:  Hydration Increase your fluid intake 48 hours prior to treatment and drink at least 8 to 12 cups (64 ounces) of water/decaffeinated beverages  per day after treatment. You can still have your cup of coffee or soda but these beverages do not count as part of your 8 to 12 cups that you need to drink daily. No alcohol intake.  Medications Continue taking your normal prescription medication as prescribed.  If you start any new herbal or new supplements please let us know first to make sure it is safe.  Mouth Care Have teeth cleaned professionally before starting treatment. Keep dentures and partial plates clean. Use soft toothbrush and do not use mouthwashes that contain alcohol. Biotene is a good mouthwash that is available at most pharmacies or may be ordered by calling [phone]. Use warm salt water gargles (1 teaspoon salt per 1 quart warm water) before and after meals and at bedtime. Or you may rinse with 2 tablespoons of three-percent hydrogen peroxide mixed in eight ounces of water. If you are still having problems with your mouth or sores in your mouth please call the clinic. If you need dental work, please let the doctor know before you go for your appointment so that we can coordinate the best possible time for you in regards to your chemo regimen. You need to also let your dentist know that you are actively taking chemo. We may need to do labs prior to your dental appointment.  Skin Care Always use sunscreen that has not expired and with SPF (Sun Protection Factor) of 50 or higher. Wear hats to protect your head from the sun. Remember to use sunscreen on your hands, ears, face, & feet.  Use good moisturizing lotions such as udder cream, eucerin, or even Vaseline. Some chemotherapies can cause dry skin, color changes in  your skin and nails.    . Avoid long, hot showers or baths. . Use gentle, fragrance-free soaps and laundry detergent. . Use moisturizers, preferably creams or ointments rather than lotions because the thicker consistency is better at preventing skin dehydration. Apply the cream or ointment within 15 minutes of  showering. Reapply moisturizer at night, and moisturize your hands every time after you wash them.  Hair Loss (if your doctor says your hair will fall out)  . If your doctor says that your hair is likely to fall out, decide before you begin chemo whether you want to wear a wig. You may want to shop before treatment to match your hair color. . Hats, turbans, and scarves can also camouflage hair loss, although some people prefer to leave their heads uncovered. If you go bare-headed outdoors, be sure to use sunscreen on your scalp. . Cut your hair short. It eases the inconvenience of shedding lots of hair, but it also can reduce the emotional impact of watching your hair fall out. . Don't perm or color your hair during chemotherapy. Those chemical treatments are already damaging to hair and can enhance hair loss. Once your chemo treatments are done and your hair has grown back, it's OK to resume dyeing or perming hair.  With chemotherapy, hair loss is almost always temporary. But when it grows back, it may be a different color or texture. In older adults who still had hair color before chemotherapy, the new growth may be completely gray.  Often, new hair is very fine and soft.  Infection Prevention Please wash your hands for at least 30 seconds using warm soapy water. Handwashing is the #1 way to prevent the spread of germs. Stay away from sick people or people who are getting over a cold. If you develop respiratory systems such as green/yellow mucus production or productive cough or persistent cough let us know and we will see if you need an antibiotic. It is a good idea to keep a pair of gloves on when going into grocery stores/[employer] to decrease your risk of coming into contact with germs on the carts, etc. Carry alcohol hand gel with you at all times and use it frequently if out in public. If your temperature reaches 100.5 or higher please call the clinic and let us know.  If it is after hours or on  the weekend please go to the ER if your temperature is over 100.5.  Please have your own personal thermometer at home to use.    Sex and bodily fluids If you are going to have sex, a condom must be used to protect the person that isn't taking chemotherapy. Chemo can decrease your libido (sex drive). For a few days after chemotherapy, chemotherapy can be excreted through your bodily fluids.  When using the toilet please close the lid and flush the toilet twice.  Do this for a few day after you have had chemotherapy.   Effects of chemotherapy on your sex life Some changes are simple and won't last long. They won't affect your sex life permanently.  Sometimes you may feel: . too tired . not strong enough to be very active . sick or sore  . not in the mood . anxious or low Your anxiety might not seem related to sex. For example, you may be worried about the cancer and how your treatment is going. Or you may be worried about money, or about how you family are coping with your illness. These  things can cause stress, which can affect your interest in sex. It's important to talk to your partner about how you feel. Remember - the changes to your sex life don't usually last long. There's usually no medical reason to stop having sex during chemo. The drugs won't have any long term physical effects on your performance or enjoyment of sex. Cancer can't be passed on to your partner during sex  Contraception It's important to use reliable contraception during treatment. Avoid getting pregnant while you or your partner are having chemotherapy. This is because the drugs may harm the baby. Sometimes chemotherapy drugs can leave a man or woman infertile.  This means you would not be able to have children in the future. You might want to talk to someone about permanent infertility. It can be very difficult to learn that you may no longer be able to have children. Some people find counselling helpful. There might be  ways to preserve your fertility, although this is easier for men than for women. You may want to speak to a fertility expert. You can talk about sperm banking or harvesting your eggs. You can also ask about other fertility options, such as donor eggs. If you have or have had breast cancer, your doctor might advise you not to take the contraceptive pill. This is because the hormones in it might affect the cancer.  It is not known for sure whether or not chemotherapy drugs can be passed on through semen or secretions from the vagina. Because of this some doctors advise people to use a barrier method if you have sex during treatment. This applies to vaginal, anal or oral sex. Generally, doctors advise a barrier method only for the time you are actually having the treatment and for about a week after your treatment. Advice like this can be worrying, but this does not mean that you have to avoid being intimate with your partner. You can still have close contact with your partner and continue to enjoy sex.  Animals If you have cats or birds we just ask that you not change the litter or change the cage.  Please have someone else do this for you while you are on chemotherapy.   Food Safety During and After Cancer Treatment Food safety is important for people both during and after cancer treatment. Cancer and cancer treatments, such as chemotherapy, radiation therapy, and stem cell/bone marrow transplantation, often weaken the immune system. This makes it harder for your body to protect itself from foodborne illness, also called food poisoning. Foodborne illness is caused by eating food that contains harmful bacteria, parasites, or viruses.  Foods to avoid Some foods have a higher risk of becoming tainted with bacteria. These include: Marland Kitchen Unwashed fresh fruit and vegetables, especially leafy vegetables that can hide dirt and other contaminants . Raw sprouts, such as alfalfa sprouts . Raw or undercooked beef,  especially ground beef, or other raw or undercooked meat and poultry . Fatty, fried, or spicy foods immediately before or after treatment.  These can sit heavy on your stomach and make you feel nauseous. . Raw or undercooked shellfish, such as oysters. . Sushi and sashimi, which often contain raw fish.  . Unpasteurized beverages, such as unpasteurized fruit juices, raw milk, raw yogurt, or cider . Undercooked eggs, such as soft boiled, over easy, and poached; raw, unpasteurized eggs; or foods made with raw egg, such as homemade raw cookie dough and homemade mayonnaise  Simple steps for food safety  Shop  smart. . Do not buy food stored or displayed in an unclean area. . Do not buy bruised or damaged fruits or vegetables. . Do not buy cans that have cracks, dents, or bulges. . Pick up foods that can spoil at the end of your shopping trip and store them in a cooler on the way home.  Prepare and clean up foods carefully. . Rinse all fresh fruits and vegetables under running water, and dry them with a clean towel or paper towel. . Clean the top of cans before opening them. . After preparing food, wash your hands for 20 seconds with hot water and soap. Pay special attention to areas between fingers and under nails. . Clean your utensils and dishes with hot water and soap. Marland Kitchen Disinfect your kitchen and cutting boards using 1 teaspoon of liquid, unscented bleach mixed into 1 quart of water.    Dispose of old food. . Eat canned and packaged food before its expiration date (the "use by" or "best before" date). . Consume refrigerated leftovers within 3 to 4 days. After that time, throw out the food. Even if the food does not smell or look spoiled, it still may be unsafe. Some bacteria, such as Listeria, can grow even on foods stored in the refrigerator if they are kept for too long.  Take precautions when eating out. . At restaurants, avoid buffets and salad bars where food sits out for a long time  and comes in contact with many people. Food can become contaminated when someone with a virus, often a norovirus, or another "bug" handles it. . Put any leftover food in a "to-go" container yourself, rather than having the server do it. And, refrigerate leftovers as soon as you get home. . Choose restaurants that are clean and that are willing to prepare your food as you order it cooked.   MEDICATIONS:                                                                                                                                                                Compazine/Prochlorperazine 10mg  tablet. Take 1 tablet every 6 hours as needed for nausea/vomiting. (This can make you sleepy)   EMLA cream. Apply a quarter size amount to port site 1 hour prior to chemo. Do not rub in. Cover with plastic wrap.   Over-the-Counter Meds:  Colace - 100 mg capsules - take 2 capsules daily.  If this doesn't help then you can increase to 2 capsules twice daily.  Call us if this does not help your bowels move.   Imodium 2mg  capsule. Take 2 capsules after the 1st loose stool and then 1 capsule every 2 hours until you go a total of 12 hours without having a loose stool. Call the [facility]  if loose stools continue. If diarrhea occurs at bedtime, take 2 capsules at bedtime. Then take 2 capsules every 4 hours until morning. Call [facility].    Diarrhea Sheet   If you are having loose stools/diarrhea, please purchase Imodium and begin taking as outlined:  At the first sign of poorly formed or loose stools you should begin taking Imodium (loperamide) 2 mg capsules.  Take two caplets (4mg ) followed by one caplet (2mg ) every 2 hours until you have had no diarrhea for 12 hours.  During the night take two caplets (4mg ) at bedtime and continue every 4 hours during the night until the morning.  Stop taking Imodium only after there is no sign of diarrhea for 12 hours.    Always call the [facility] if you are  having loose stools/diarrhea that you can't get under control.  Loose stools/diarrhea leads to dehydration (loss of water) in your body.  We have other options of trying to get the loose stools/diarrhea to stop but you must let us know!   Constipation Sheet  Colace - 100 mg capsules - take 2 capsules daily.  If this doesn't help then you can increase to 2 capsules twice daily.  Please call if the above does not work for you.   Do not go more than 2 days without a bowel movement.  It is very important that you do not become constipated.  It will make you feel sick to your stomach (nausea) and can cause abdominal pain and vomiting.   Nausea Sheet   Compazine/Prochlorperazine 10mg  tablet. Take 1 tablet every 6 hours as needed for nausea/vomiting. (This can make you sleepy)  If you are having persistent nausea (nausea that does not stop) please call the [facility] and let us know the amount of nausea that you are experiencing.  If you begin to vomit, you need to call the [facility] and if it is the weekend and you have vomited more than one time and can't get it to stop-go to the Emergency Room.  Persistent nausea/vomiting can lead to dehydration (loss of fluid in your body) and will make you feel terrible.   Ice chips, sips of clear liquids, foods that are @ room temperature, crackers, and toast tend to be better tolerated.   SYMPTOMS TO REPORT AS SOON AS POSSIBLE AFTER TREATMENT:   FEVER GREATER THAN 100.5 F  CHILLS WITH OR WITHOUT FEVER  NAUSEA AND VOMITING THAT IS NOT CONTROLLED WITH YOUR NAUSEA MEDICATION  UNUSUAL SHORTNESS OF BREATH  UNUSUAL BRUISING OR BLEEDING  TENDERNESS IN MOUTH AND THROAT WITH OR WITHOUT PRESENCE OF ULCERS  URINARY PROBLEMS  BOWEL PROBLEMS  UNUSUAL RASH      Wear comfortable clothing and clothing appropriate for easy access to any Portacath or PICC line. Let us know if there is anything that we can do to make your therapy better!    What  to do if you need assistance after hours or on the weekends: CALL [phone].  HOLD on the line, do not hang up.  You will hear multiple messages but at the end you will be connected with a nurse triage line.  They will contact the doctor if necessary.  Most of the time they will be able to assist you.  Do not call the hospital operator.      I have been informed and understand all of the instructions given to me and have received a copy. I have been instructed to call the clinic [phone] or my family physician as soon as possible for continued medical care, if indicated. I do not have any more questions at this time but understand that I may call the [facility] or the Patient Navigator at [phone] during office hours should I have questions or need assistance in obtaining follow-up care.

## 2019-02-13 NOTE — Pre-Procedure Instructions (Signed)
Potassium routed to Dr Constance Haw.

## 2019-02-14 ENCOUNTER — Inpatient Hospital Stay (HOSPITAL_COMMUNITY): Payer: Medicare Other

## 2019-02-14 ENCOUNTER — Encounter (HOSPITAL_COMMUNITY): Payer: Self-pay

## 2019-02-14 ENCOUNTER — Encounter (HOSPITAL_COMMUNITY)
Admission: RE | Admit: 2019-02-14 | Discharge: 2019-02-14 | Disposition: A | Discharge status: Home / Self Care | Payer: Medicare Other | Source: Ambulatory Visit | Attending: General Surgery | Admitting: General Surgery

## 2019-02-14 DIAGNOSIS — C221 Intrahepatic bile duct carcinoma: Secondary | ICD-10-CM

## 2019-02-14 DIAGNOSIS — C787 Secondary malignant neoplasm of liver and intrahepatic bile duct: Principal | ICD-10-CM

## 2019-02-16 ENCOUNTER — Ambulatory Visit (HOSPITAL_COMMUNITY): Payer: Medicare Other

## 2019-02-16 ENCOUNTER — Ambulatory Visit (HOSPITAL_COMMUNITY): Payer: Medicare Other | Admitting: Anesthesiology

## 2019-02-16 ENCOUNTER — Encounter (HOSPITAL_COMMUNITY): Payer: Self-pay | Admitting: *Deleted

## 2019-02-16 ENCOUNTER — Ambulatory Visit (HOSPITAL_COMMUNITY)
Admission: RE | Admit: 2019-02-16 | Discharge: 2019-02-16 | Disposition: A | Discharge status: Home / Self Care | Payer: Medicare Other | Attending: General Surgery | Admitting: General Surgery

## 2019-02-16 ENCOUNTER — Encounter (HOSPITAL_COMMUNITY)
Admission: RE | Disposition: A | Discharge status: Home / Self Care | Payer: Self-pay | Source: Home / Self Care | Attending: General Surgery

## 2019-02-16 ENCOUNTER — Other Ambulatory Visit: Payer: Self-pay

## 2019-02-16 DIAGNOSIS — Z96642 Presence of left artificial hip joint: Secondary | ICD-10-CM | POA: Diagnosis not present

## 2019-02-16 DIAGNOSIS — Z79899 Other long term (current) drug therapy: Secondary | ICD-10-CM | POA: Diagnosis not present

## 2019-02-16 DIAGNOSIS — C221 Intrahepatic bile duct carcinoma: Secondary | ICD-10-CM | POA: Insufficient documentation

## 2019-02-16 DIAGNOSIS — I10 Essential (primary) hypertension: Secondary | ICD-10-CM | POA: Insufficient documentation

## 2019-02-16 DIAGNOSIS — C787 Secondary malignant neoplasm of liver and intrahepatic bile duct: Secondary | ICD-10-CM | POA: Diagnosis not present

## 2019-02-16 DIAGNOSIS — Z95828 Presence of other vascular implants and grafts: Secondary | ICD-10-CM

## 2019-02-16 DIAGNOSIS — M1990 Unspecified osteoarthritis, unspecified site: Secondary | ICD-10-CM | POA: Insufficient documentation

## 2019-02-16 DIAGNOSIS — F17210 Nicotine dependence, cigarettes, uncomplicated: Secondary | ICD-10-CM | POA: Diagnosis not present

## 2019-02-16 HISTORY — PX: PORTACATH PLACEMENT: SHX2246

## 2019-02-16 LAB — POCT I-STAT 4, (NA,K, GLUC, HGB,HCT)
Glucose, Bld: 100 mg/dL — ABNORMAL HIGH (ref 70–99)
HCT: 35 % — ABNORMAL LOW (ref 36.0–46.0)
Hemoglobin: 11.9 g/dL — ABNORMAL LOW (ref 12.0–15.0)
Potassium: 3.5 mmol/L (ref 3.5–5.1)
SODIUM: 141 mmol/L (ref 135–145)

## 2019-02-16 SURGERY — INSERTION, TUNNELED CENTRAL VENOUS DEVICE, WITH PORT
Anesthesia: Monitor Anesthesia Care | Site: Chest | Laterality: Left

## 2019-02-16 MED ORDER — SODIUM CHLORIDE (PF) 0.9 % IJ SOLN
INTRAMUSCULAR | Status: DC | PRN
  Administered 2019-02-16: 500 mL

## 2019-02-16 MED ORDER — HYDROCODONE-ACETAMINOPHEN 5-325 MG PO TABS: 1.0000 | ORAL_TABLET | ORAL | 0 refills | Status: DC | PRN

## 2019-02-16 MED ORDER — PROPOFOL 500 MG/50ML IV EMUL
INTRAVENOUS | Status: DC | PRN
  Administered 2019-02-16: 75 ug/kg/min via INTRAVENOUS

## 2019-02-16 MED ORDER — FENTANYL CITRATE (PF) 250 MCG/5ML IJ SOLN
INTRAMUSCULAR | Status: AC
  Filled 2019-02-16: qty 5

## 2019-02-16 MED ORDER — CHLORHEXIDINE GLUCONATE CLOTH 2 % EX PADS: 6.0000 | MEDICATED_PAD | Freq: Once | CUTANEOUS | Status: DC

## 2019-02-16 MED ORDER — MIDAZOLAM HCL 2 MG/2ML IJ SOLN
INTRAMUSCULAR | Status: AC
  Filled 2019-02-16: qty 2

## 2019-02-16 MED ORDER — CEFAZOLIN SODIUM-DEXTROSE 2-4 GM/100ML-% IV SOLN
2.0000 g | INTRAVENOUS | Status: AC
  Administered 2019-02-16: 2 g via INTRAVENOUS

## 2019-02-16 MED ORDER — HYDROMORPHONE HCL 1 MG/ML IJ SOLN: 0.2500 mg | INTRAMUSCULAR | Status: DC | PRN

## 2019-02-16 MED ORDER — MIDAZOLAM HCL 2 MG/2ML IJ SOLN: 0.5000 mg | Freq: Once | INTRAMUSCULAR | Status: DC | PRN

## 2019-02-16 MED ORDER — HEPARIN SOD (PORK) LOCK FLUSH 100 UNIT/ML IV SOLN
INTRAVENOUS | Status: DC | PRN
  Administered 2019-02-16: 500 [IU] via INTRAVENOUS

## 2019-02-16 MED ORDER — CEFAZOLIN SODIUM-DEXTROSE 2-4 GM/100ML-% IV SOLN
INTRAVENOUS | Status: AC
  Filled 2019-02-16: qty 100

## 2019-02-16 MED ORDER — HEPARIN SOD (PORK) LOCK FLUSH 100 UNIT/ML IV SOLN
INTRAVENOUS | Status: AC
  Filled 2019-02-16: qty 5

## 2019-02-16 MED ORDER — LIDOCAINE 2% (20 MG/ML) 5 ML SYRINGE
INTRAMUSCULAR | Status: AC
  Filled 2019-02-16: qty 5

## 2019-02-16 MED ORDER — KETAMINE HCL 50 MG/5ML IJ SOSY
PREFILLED_SYRINGE | INTRAMUSCULAR | Status: AC
  Filled 2019-02-16: qty 5

## 2019-02-16 MED ORDER — HYDROCODONE-ACETAMINOPHEN 7.5-325 MG PO TABS: 1.0000 | ORAL_TABLET | Freq: Once | ORAL | Status: DC | PRN

## 2019-02-16 MED ORDER — PROPOFOL 10 MG/ML IV BOLUS
INTRAVENOUS | Status: AC
  Filled 2019-02-16: qty 40

## 2019-02-16 MED ORDER — PROPOFOL 10 MG/ML IV BOLUS
INTRAVENOUS | Status: AC
  Filled 2019-02-16: qty 20

## 2019-02-16 MED ORDER — GLYCOPYRROLATE PF 0.2 MG/ML IJ SOSY
PREFILLED_SYRINGE | INTRAMUSCULAR | Status: AC
  Filled 2019-02-16: qty 1

## 2019-02-16 MED ORDER — LIDOCAINE HCL (PF) 1 % IJ SOLN
INTRAMUSCULAR | Status: AC
  Filled 2019-02-16: qty 30

## 2019-02-16 MED ORDER — LACTATED RINGERS IV SOLN
INTRAVENOUS | Status: DC
  Administered 2019-02-16: 1000 mL via INTRAVENOUS

## 2019-02-16 MED ORDER — LIDOCAINE HCL (PF) 1 % IJ SOLN
INTRAMUSCULAR | Status: DC | PRN
  Administered 2019-02-16: 10 mL

## 2019-02-16 MED ORDER — KETAMINE HCL 10 MG/ML IJ SOLN
INTRAMUSCULAR | Status: DC | PRN
  Administered 2019-02-16 (×2): 10 mg via INTRAVENOUS

## 2019-02-16 MED ORDER — PROMETHAZINE HCL 25 MG/ML IJ SOLN: 6.2500 mg | INTRAMUSCULAR | Status: DC | PRN

## 2019-02-16 SURGICAL SUPPLY — 32 items
BAG DECANTER FOR FLEXI CONT (MISCELLANEOUS) ×2 IMPLANT
CHLORAPREP W/TINT 10.5 ML (MISCELLANEOUS) ×2 IMPLANT
CLOTH BEACON ORANGE TIMEOUT ST (SAFETY) ×2 IMPLANT
COVER LIGHT HANDLE STERIS (MISCELLANEOUS) ×4 IMPLANT
DECANTER SPIKE VIAL GLASS SM (MISCELLANEOUS) ×2 IMPLANT
DERMABOND ADVANCED (GAUZE/BANDAGES/DRESSINGS) ×1
DERMABOND ADVANCED .7 DNX12 (GAUZE/BANDAGES/DRESSINGS) ×1 IMPLANT
DRAPE C-ARM FOLDED MOBILE STRL (DRAPES) ×2 IMPLANT
ELECT REM PT RETURN 9FT ADLT (ELECTROSURGICAL) ×2
ELECTRODE REM PT RTRN 9FT ADLT (ELECTROSURGICAL) ×1 IMPLANT
GLOVE BIO SURGEON STRL SZ 6.5 (GLOVE) ×2 IMPLANT
GLOVE BIOGEL PI IND STRL 6.5 (GLOVE) ×1 IMPLANT
GLOVE BIOGEL PI IND STRL 7.0 (GLOVE) ×1 IMPLANT
GLOVE BIOGEL PI INDICATOR 6.5 (GLOVE) ×1
GLOVE BIOGEL PI INDICATOR 7.0 (GLOVE) ×1
GLOVE ECLIPSE 6.5 STRL STRAW (GLOVE) ×2 IMPLANT
GOWN STRL REUS W/TWL LRG LVL3 (GOWN DISPOSABLE) ×4 IMPLANT
IV NS 500ML (IV SOLUTION) ×1
IV NS 500ML BAXH (IV SOLUTION) ×1 IMPLANT
KIT PORT POWER 8FR ISP MRI (Port) ×2 IMPLANT
KIT TURNOVER KIT A (KITS) ×2 IMPLANT
NEEDLE HYPO 25X1 1.5 SAFETY (NEEDLE) ×2 IMPLANT
PACK MINOR (CUSTOM PROCEDURE TRAY) ×2 IMPLANT
PAD ARMBOARD 7.5X6 YLW CONV (MISCELLANEOUS) ×2 IMPLANT
SET BASIN LINEN APH (SET/KITS/TRAYS/PACK) ×2 IMPLANT
SUT MNCRL AB 4-0 PS2 18 (SUTURE) ×2 IMPLANT
SUT PROLENE 2 0 SH 30 (SUTURE) ×2 IMPLANT
SUT VIC AB 3-0 SH 27 (SUTURE) ×1
SUT VIC AB 3-0 SH 27X BRD (SUTURE) ×1 IMPLANT
SYR 10ML LL (SYRINGE) ×2 IMPLANT
SYR 5ML LL (SYRINGE) ×2 IMPLANT
SYR CONTROL 10ML LL (SYRINGE) ×2 IMPLANT

## 2019-02-16 NOTE — Transfer of Care (Signed)
Immediate Anesthesia Transfer of Care Note  Late Entry for [phone]  Patient: Nicole Ibarra  Procedure(s) Performed: INSERTION PORT-A-CATH (attached catheter in left subclavian) (Left Chest)  Patient Location: PACU  Anesthesia Type:MAC  Level of Consciousness: awake, alert , oriented and patient cooperative  Airway & Oxygen Therapy: Patient Spontanous Breathing  Post-op Assessment: Report given to RN and Post -op Vital signs reviewed and stable  Post vital signs: Reviewed and stable  Last Vitals:  Vitals Value Taken Time  BP    Temp    Pulse    Resp    SpO2      Last Pain:  Vitals:   02/16/19 0845  TempSrc:   PainSc: 0-No pain      Patients Stated Pain Goal: 6 (11/24/[birth year] 8832)  Complications: No apparent anesthesia complications

## 2019-02-16 NOTE — Anesthesia Preprocedure Evaluation (Signed)
Anesthesia Evaluation  Patient identified by MRN, date of birth, ID band Patient awake    Reviewed: Allergy & Precautions, NPO status , Patient's Chart, lab work & pertinent test results  Airway Mallampati: II  TM Distance: >3 FB Neck ROM: Full    Dental no notable dental hx. (+) Teeth Intact   Pulmonary neg pulmonary ROS, Current Smoker,    Pulmonary exam normal breath sounds clear to auscultation       Cardiovascular Exercise Tolerance: Good hypertension, Pt. on medications negative cardio ROS Normal cardiovascular examI Rhythm:Regular Rate:Normal     Neuro/Psych  Headaches, Anxiety Depression negative psych ROS   GI/Hepatic Neg liver ROS, PUD, GERD  Medicated and Controlled,Here for port for Choriocarcinoma    Endo/Other  negative endocrine ROS  Renal/GU negative Renal ROS  negative genitourinary   Musculoskeletal  (+) Arthritis ,   Abdominal   Peds negative pediatric ROS (+)  Hematology negative hematology ROS (+) anemia ,   Anesthesia Other Findings   Reproductive/Obstetrics negative OB ROS                             Anesthesia Physical Anesthesia Plan  ASA: III  Anesthesia Plan: MAC   Post-op Pain Management:    Induction:   PONV Risk Score and Plan:   Airway Management Planned: Nasal Cannula and Simple Face Mask  Additional Equipment:   Intra-op Plan:   Post-operative Plan:   Informed Consent:   Plan Discussed with:   Anesthesia Plan Comments:         Anesthesia Quick Evaluation

## 2019-02-16 NOTE — Anesthesia Procedure Notes (Signed)
Procedure Name: MAC Date/Time: 02/16/2019 7:31 AM Performed by: Andree Elk Marveen Donlon A, CRNA Pre-anesthesia Checklist: Patient identified, Emergency Drugs available, Suction available, Timeout performed and Patient being monitored Patient Re-evaluated:Patient Re-evaluated prior to induction Oxygen Delivery Method: Nasal Cannula

## 2019-02-16 NOTE — Interval H&P Note (Signed)
History and Physical Interval Note:  02/16/2019 7:23 AM  Nicole Ibarra  has presented today for surgery, with the diagnosis of cholangiocarcinoma.  The various methods of treatment have been discussed with the patient and family. After consideration of risks, benefits and other options for treatment, the patient has consented to  Procedure(s): INSERTION PORT-A-CATH (N/A) as a surgical intervention.  The patient's history has been reviewed, patient examined, no change in status, stable for surgery.  I have reviewed the patient's chart and labs.  Questions were answered to the patient's satisfaction.    No changes or questions. Will start on left.   Virl Cagey

## 2019-02-16 NOTE — Op Note (Signed)
Operative Note 02/16/19   Preoperative Diagnosis:  Cholangiocarcinoma    Postoperative Diagnosis: Same   Procedure(s) Performed: Port-A-Cath placement, left subclavian vein    Surgeon: Lanell Matar. Constance Haw, MD   Assistants: No qualified resident was available   Anesthesia: Monitored anesthesia care   Anesthesiologist: Dr. Hilaria Ota    Specimens: None   Estimated Blood Loss: Minimal   Fluoroscopy time: 22 seconds   Blood Replacement: None    Complications: None    Operative Findings: Normal anatomy  Indications: Nicole Ibarra is a 63 yo with newly diagnosed cholangiocarcinoma who is going to undergo palliative chemotherapy. We have discussed the risk and benefits of surgery including but not limited to bleeding, infection, malfunction, pneumothorax, and she has opted to proceed.   Procedure: The patient was brought into the operating room and monitored anesthesia care was induced.  One percent lidocaine was used for local anesthesia.   The left chest and neck was prepped and draped in the usual sterile fashion.  Preoperative antibiotics were given.  An incision was made below the left clavicle. A subcutaneous pocket was formed. The needles advanced into the left subclavian vein using the Seldinger technique without difficulty. A guidewire was then advanced into the right atrium under fluoroscopic guidance.  Ectopia was not noted. An introducer and peel-away sheath were placed over the guidewire. The catheter was then inserted through the peel-away sheath and the peel-away sheath was removed.  A spot film was performed to confirm the position. The catheter was then attached to the port and the port placed in subcutaneous pocket. Adequate positioning was confirmed by fluoroscopy. Hemostasis was confirmed, and the port was secured with 2-0 prolene sutures.  Good backflow of blood was noted on aspiration of the port. The port was flushed with heparin flush. Subcutaneous layer was reapproximated  using a 3-0 Vicryl interrupted suture. The skin was closed using a 4-0 Vicryl subcuticular suture. Dermabond was applied.  All tape and needle counts were correct at the end of the procedure. The patient was transferred to PACU in stable condition. A chest x-ray will be performed at that time.  Nicole Labrum, MD [facility] [address] [place], [place] [address] [phone] (office)

## 2019-02-16 NOTE — Anesthesia Postprocedure Evaluation (Signed)
Anesthesia Post Note Late entry for 0825  Patient: Nicole Ibarra  Procedure(s) Performed: INSERTION PORT-A-CATH (attached catheter in left subclavian) (Left Chest)  Patient location during evaluation: PACU Anesthesia Type: MAC Level of consciousness: awake and alert and oriented Pain management: pain level controlled Vital Signs Assessment: post-procedure vital signs reviewed and stable Respiratory status: spontaneous breathing Cardiovascular status: stable Postop Assessment: no apparent nausea or vomiting Anesthetic complications: no     Last Vitals:  Vitals:   02/16/19 0845 02/16/19 0900  BP: (!) 157/74 (!) 167/88  Resp: 12 10  Temp:    SpO2: 100% 100%    Last Pain:  Vitals:   02/16/19 0845  TempSrc:   PainSc: 0-No pain                 ADAMS, AMY A

## 2019-02-16 NOTE — Progress Notes (Signed)
CXR looks good with no pneumothorax and port in good place.  Patient is aware of her cancer and that this cannot be removed surgically. She wants to call the process an "infection" but does understand that she has cancer per her report.  Curlene Labrum, MD [facility] [address] [place], [place] [address] [phone] (office)

## 2019-02-16 NOTE — Discharge Instructions (Signed)
Keep area clean and dry. You can take a shower in 24 hours. Do not submerge in water.  °Take tylenol and ibuprofen for pain control and Norco for severe pain. ° ° °Implanted Port Home Guide °An implanted port is a device that is placed under the skin. It is usually placed in the chest. The device can be used to give IV medicine, to take blood, or for dialysis. You may have an implanted port if: °· You need IV medicine that would be irritating to the small veins in your hands or arms. °· You need IV medicines, such as antibiotics, for a long period of time. °· You need IV nutrition for a long period of time. °· You need dialysis. °Having a port means that your health care provider will not need to use the veins in your arms for these procedures. You may have fewer limitations when using a port than you would if you used other types of long-term IVs, and you will likely be able to return to normal activities after your incision heals. °An implanted port has two main parts: °· Reservoir. The reservoir is the part where a needle is inserted to give medicines or draw blood. The reservoir is round. After it is placed, it appears as a small, raised area under your skin. °· Catheter. The catheter is a thin, flexible tube that connects the reservoir to a vein. Medicine that is inserted into the reservoir goes into the catheter and then into the vein. °How is my port accessed? °To access your port: °· A numbing cream may be placed on the skin over the port site. °· Your health care provider will put on a mask and sterile gloves. °· The skin over your port will be cleaned carefully with a germ-killing soap and allowed to dry. °· Your health care provider will gently pinch the port and insert a needle into it. °· Your health care provider will check for a blood return to make sure the port is in the vein and is not clogged. °· If your port needs to remain accessed to get medicine continuously (constant infusion), your health  care provider will place a clear bandage (dressing) over the needle site. The dressing and needle will need to be changed every week, or as told by your health care provider. °What is flushing? °Flushing helps keep the port from getting clogged. Follow instructions from your health care provider about how and when to flush the port. Ports are usually flushed with saline solution or a medicine called heparin. The need for flushing will depend on how the port is used: °· If the port is only used from time to time to give medicines or draw blood, the port may need to be flushed: °? Before and after medicines have been given. °? Before and after blood has been drawn. °? As part of routine maintenance. Flushing may be recommended every 4-6 weeks. °· If a constant infusion is running, the port may not need to be flushed. °· Throw away any syringes in a disposal container that is meant for sharp items (sharps container). You can buy a sharps container from a pharmacy, or you can make one by using an empty hard plastic bottle with a cover. °How long will my port stay implanted? °The port can stay in for as long as your health care provider thinks it is needed. When it is time for the port to come out, a surgery will be done to remove it.   The surgery will be similar to the procedure that was done to put the port in. °Follow these instructions at home: ° °· Flush your port as told by your health care provider. °· If you need an infusion over several days, follow instructions from your health care provider about how to take care of your port site. Make sure you: °? Wash your hands with soap and water before you change your dressing. If soap and water are not available, use alcohol-based hand sanitizer. °? Change your dressing as told by your health care provider. °? Place any used dressings or infusion bags into a plastic bag. Throw that bag in the trash. °? Keep the dressing that covers the needle clean and dry. Do not get it  wet. °? Do not use scissors or sharp objects near the tube. °? Keep the tube clamped, unless it is being used. °· Check your port site every day for signs of infection. Check for: °? Redness, swelling, or pain. °? Fluid or blood. °? Pus or a bad smell. °· Protect the skin around the port site. °? Avoid wearing bra straps that rub or irritate the site. °? Protect the skin around your port from seat belts. Place a soft pad over your chest if needed. °· Bathe or shower as told by your health care provider. The site may get wet as long as you are not actively receiving an infusion. °· Return to your normal activities as told by your health care provider. Ask your health care provider what activities are safe for you. °· Carry a medical alert card or wear a medical alert bracelet at all times. This will let health care providers know that you have an implanted port in case of an emergency. °Get help right away if: °· You have redness, swelling, or pain at the port site. °· You have fluid or blood coming from your port site. °· You have pus or a bad smell coming from the port site. °· You have a fever. °Summary °· Implanted ports are usually placed in the chest for long-term IV access. °· Follow instructions from your health care provider about flushing the port and changing bandages (dressings). °· Take care of the area around your port by avoiding clothing that puts pressure on the area, and by watching for signs of infection. °· Protect the skin around your port from seat belts. Place a soft pad over your chest if needed. °· Get help right away if you have a fever or you have redness, swelling, pain, drainage, or a bad smell at the port site. °This information is not intended to replace advice given to you by your health care provider. Make sure you discuss any questions you have with your health care provider. °Document Released: 11/23/2005 Document Revised: 12/26/2016 Document Reviewed: 12/26/2016 °Elsevier  Interactive Patient Education © 2019 [employer] ° °

## 2019-02-17 ENCOUNTER — Encounter: Payer: Self-pay | Admitting: Hematology

## 2019-02-17 ENCOUNTER — Encounter: Payer: Self-pay | Admitting: General Surgery

## 2019-02-17 ENCOUNTER — Encounter (HOSPITAL_COMMUNITY): Payer: Self-pay | Admitting: General Surgery

## 2019-02-17 DIAGNOSIS — C221 Intrahepatic bile duct carcinoma: Secondary | ICD-10-CM | POA: Diagnosis not present

## 2019-02-17 NOTE — Telephone Encounter (Signed)
Accidental phone encounter opened. Opened in error. Disregard.  Curlene Labrum, MD [facility] [address] [place], [place] [address] [phone] (office)

## 2019-02-22 ENCOUNTER — Encounter (HOSPITAL_COMMUNITY): Payer: Self-pay | Admitting: Hematology

## 2019-02-22 ENCOUNTER — Inpatient Hospital Stay (HOSPITAL_COMMUNITY): Payer: Medicare Other

## 2019-02-22 ENCOUNTER — Other Ambulatory Visit: Payer: Self-pay

## 2019-02-22 ENCOUNTER — Inpatient Hospital Stay (HOSPITAL_BASED_OUTPATIENT_CLINIC_OR_DEPARTMENT_OTHER): Payer: Medicare Other | Admitting: Hematology

## 2019-02-22 ENCOUNTER — Encounter (HOSPITAL_COMMUNITY): Payer: Self-pay

## 2019-02-22 ENCOUNTER — Ambulatory Visit (HOSPITAL_COMMUNITY): Payer: Medicare Other | Admitting: Hematology

## 2019-02-22 VITALS — BP 151/74 | HR 64 | Temp 97.8°F | Resp 18 | Wt 209.8 lb

## 2019-02-22 VITALS — BP 154/65 | HR 65 | Temp 98.5°F | Resp 18

## 2019-02-22 DIAGNOSIS — C787 Secondary malignant neoplasm of liver and intrahepatic bile duct: Principal | ICD-10-CM

## 2019-02-22 DIAGNOSIS — K5900 Constipation, unspecified: Secondary | ICD-10-CM | POA: Diagnosis not present

## 2019-02-22 DIAGNOSIS — I10 Essential (primary) hypertension: Secondary | ICD-10-CM

## 2019-02-22 DIAGNOSIS — C221 Intrahepatic bile duct carcinoma: Secondary | ICD-10-CM

## 2019-02-22 DIAGNOSIS — Z8042 Family history of malignant neoplasm of prostate: Secondary | ICD-10-CM

## 2019-02-22 DIAGNOSIS — Z803 Family history of malignant neoplasm of breast: Secondary | ICD-10-CM

## 2019-02-22 DIAGNOSIS — F419 Anxiety disorder, unspecified: Secondary | ICD-10-CM

## 2019-02-22 DIAGNOSIS — Z79899 Other long term (current) drug therapy: Secondary | ICD-10-CM

## 2019-02-22 DIAGNOSIS — F17210 Nicotine dependence, cigarettes, uncomplicated: Secondary | ICD-10-CM

## 2019-02-22 DIAGNOSIS — Z800 Family history of malignant neoplasm of digestive organs: Secondary | ICD-10-CM

## 2019-02-22 DIAGNOSIS — E876 Hypokalemia: Secondary | ICD-10-CM

## 2019-02-22 DIAGNOSIS — K829 Disease of gallbladder, unspecified: Secondary | ICD-10-CM

## 2019-02-22 LAB — CBC WITH DIFFERENTIAL/PLATELET
Abs Immature Granulocytes: 0 10*3/uL (ref 0.00–0.07)
Basophils Absolute: 0 10*3/uL (ref 0.0–0.1)
Basophils Relative: 1 %
EOS ABS: 0.2 10*3/uL (ref 0.0–0.5)
EOS PCT: 5 %
HCT: 35.2 % — ABNORMAL LOW (ref 36.0–46.0)
Hemoglobin: 11.9 g/dL — ABNORMAL LOW (ref 12.0–15.0)
Immature Granulocytes: 0 %
Lymphocytes Relative: 41 %
Lymphs Abs: 1.9 10*3/uL (ref 0.7–4.0)
MCH: 31.2 pg (ref 26.0–34.0)
MCHC: 33.8 g/dL (ref 30.0–36.0)
MCV: 92.4 fL (ref 80.0–100.0)
Monocytes Absolute: 0.3 10*3/uL (ref 0.1–1.0)
Monocytes Relative: 7 %
Neutro Abs: 2.2 10*3/uL (ref 1.7–7.7)
Neutrophils Relative %: 46 %
Platelets: 162 10*3/uL (ref 150–400)
RBC: 3.81 MIL/uL — ABNORMAL LOW (ref 3.87–5.11)
RDW: 14 % (ref 11.5–15.5)
WBC: 4.7 10*3/uL (ref 4.0–10.5)
nRBC: 0 % (ref 0.0–0.2)

## 2019-02-22 LAB — MAGNESIUM: MAGNESIUM: 2 mg/dL (ref 1.7–2.4)

## 2019-02-22 LAB — COMPREHENSIVE METABOLIC PANEL
ALT: 40 U/L (ref 0–44)
ANION GAP: 10 (ref 5–15)
AST: 36 U/L (ref 15–41)
Albumin: 3.8 g/dL (ref 3.5–5.0)
Alkaline Phosphatase: 277 U/L — ABNORMAL HIGH (ref 38–126)
BUN: 14 mg/dL (ref 8–23)
CO2: 26 mmol/L (ref 22–32)
Calcium: 9.7 mg/dL (ref 8.9–10.3)
Chloride: 101 mmol/L (ref 98–111)
Creatinine, Ser: 0.96 mg/dL (ref 0.44–1.00)
GFR calc Af Amer: >60 mL/min (ref >60)
GFR calc non Af Amer: >60 mL/min (ref >60)
Glucose, Bld: 115 mg/dL — ABNORMAL HIGH (ref 70–99)
POTASSIUM: 3.2 mmol/L — AB (ref 3.5–5.1)
Sodium: 137 mmol/L (ref 135–145)
Total Bilirubin: 0.7 mg/dL (ref 0.3–1.2)
Total Protein: 7.7 g/dL (ref 6.5–8.1)

## 2019-02-22 LAB — LACTATE DEHYDROGENASE: LDH: 177 U/L (ref 98–192)

## 2019-02-22 MED ORDER — SODIUM CHLORIDE 0.9 % IV SOLN
Freq: Once | INTRAVENOUS | Status: AC
  Administered 2019-02-22: 09:00:00 via INTRAVENOUS

## 2019-02-22 MED ORDER — POTASSIUM CHLORIDE CRYS ER 20 MEQ PO TBCR
40.0000 meq | EXTENDED_RELEASE_TABLET | Freq: Once | ORAL | Status: AC
  Administered 2019-02-22: 40 meq via ORAL
  Filled 2019-02-22: qty 2

## 2019-02-22 MED ORDER — SODIUM CHLORIDE 0.9% FLUSH
10.0000 mL | INTRAVENOUS | Status: DC | PRN
  Administered 2019-02-22: 10 mL
  Filled 2019-02-22: qty 10

## 2019-02-22 MED ORDER — PALONOSETRON HCL INJECTION 0.25 MG/5ML
0.2500 mg | Freq: Once | INTRAVENOUS | Status: AC
  Administered 2019-02-22: 0.25 mg via INTRAVENOUS
  Filled 2019-02-22: qty 5

## 2019-02-22 MED ORDER — SODIUM CHLORIDE 0.9 % IV SOLN
2000.0000 mg | Freq: Once | INTRAVENOUS | Status: AC
  Administered 2019-02-22: 2000 mg via INTRAVENOUS
  Filled 2019-02-22: qty 52.6

## 2019-02-22 MED ORDER — POTASSIUM CHLORIDE 2 MEQ/ML IV SOLN
Freq: Once | INTRAVENOUS | Status: AC
  Administered 2019-02-22: 09:00:00 via INTRAVENOUS
  Filled 2019-02-22: qty 10

## 2019-02-22 MED ORDER — HEPARIN SOD (PORK) LOCK FLUSH 100 UNIT/ML IV SOLN
500.0000 [IU] | Freq: Once | INTRAVENOUS | Status: AC | PRN
  Administered 2019-02-22: 500 [IU]

## 2019-02-22 MED ORDER — SODIUM CHLORIDE 0.9 % IV SOLN
25.0000 mg/m2 | Freq: Once | INTRAVENOUS | Status: AC
  Administered 2019-02-22: 52 mg via INTRAVENOUS
  Filled 2019-02-22: qty 52

## 2019-02-22 MED ORDER — SODIUM CHLORIDE 0.9 % IV SOLN
Freq: Once | INTRAVENOUS | Status: AC
  Administered 2019-02-22: 11:00:00 via INTRAVENOUS
  Filled 2019-02-22: qty 5

## 2019-02-22 NOTE — Patient Instructions (Addendum)
[place] at [facility] Discharge Instructions  You were seen today by Dr. Delton Coombes. He went over your recent lab results, your potassium was a little low so you will be given oral potassium today. He wants you to start taking your nausea medicine tonight and take it every 6 hours for 2 days. He would like you to drink plenty of water up to 2 liters a day. He wants you to start drinking 1-2 boost or Ensure drinks a day to help with nutrition. He would like you to have IV fluids tomorrow and Friday as well. He will see you back in 1 week for labs, treatment and follow up.   Thank you for choosing [facility] at [facility] to provide your oncology and hematology care.  To afford each patient quality time with our provider, please arrive at least 15 minutes before your scheduled appointment time.   If you have a lab appointment with the [facility] please come in thru the  Main Entrance and check in at the main information desk  You need to re-schedule your appointment should you arrive 10 or more minutes late.  We strive to give you quality time with our providers, and arriving late affects you and other patients whose appointments are after yours.  Also, if you no show three or more times for appointments you may be dismissed from the clinic at the providers discretion.     Again, thank you for choosing [facility].  Our hope is that these requests will decrease the amount of time that you wait before being seen by our physicians.       _____________________________________________________________  Should you have questions after your visit to [facility], please contact our office at [phone] between the hours of 8:00 a.m. and 4:30 p.m.  Voicemails left after 4:00 p.m. will not be returned until the following business day.  For prescription refill requests, have your pharmacy contact our office and allow 72 hours.     Cancer Center Support Programs:   > Cancer Support Group  2nd Tuesday of the month 1pm-2pm, Journey Room

## 2019-02-22 NOTE — Progress Notes (Signed)
Patient seen by Dr. Delton Coombes with lab review and ok to treat today.  Patient to return Thursday and Friday for hydration with potassium and magnesium.  Patient denied ringing in ears and neuropathy.  Reinforced the importance of good hydration due to side effects of chemotherapy.   Reminded the patient to start nausea medications as directed by the oncologist with understanding verbalized. No s/s of distress noted.    Patient tolerated chemotherapy with no complaints voiced.  Port site clean and dry with no bruising noted at site.  Good blood return noted before and after administration of chemotherapy.  Patients port redressed with biodisc and instructed to keep dressing dry.  Patient left ambulatory with VSS and no s/s of distress noted.

## 2019-02-22 NOTE — Progress Notes (Signed)
[place], [place] [address]   CLINIC:  Medical Oncology/Hematology  PCP:  Cory Munch, PA-C [place] [id number] [phone]   REASON FOR VISIT:  Follow-up for adenocarcinoma of the liver andgallbladder mass with lymph node involvement  CURRENT THERAPY: Cisplatin/gemcitabine days 1, 8 every 21 days.   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[place])    02/22/2019 -  Chemotherapy    The patient had palonosetron (ALOXI) injection 0.25 mg, 0.25 mg, Intravenous,  Once, 1 of 4 cycles Administration: 0.25 mg (02/22/2019) pegfilgrastim-cbqv (UDENYCA) injection 6 mg, 6 mg, Subcutaneous, Once, 1 of 4 cycles CISplatin (PLATINOL) 52 mg in sodium chloride 0.9 % 250 mL chemo infusion, 25 mg/m2 = 52 mg, Intravenous,  Once, 1 of 4 cycles Administration: 52 mg (02/22/2019) gemcitabine (GEMZAR) 2,000 mg in sodium chloride 0.9 % 250 mL chemo infusion, 2,090 mg, Intravenous,  Once, 1 of 4 cycles Administration: 2,000 mg (02/22/2019) fosaprepitant (EMEND) 150 mg, dexamethasone (DECADRON) 12 mg in sodium chloride 0.9 % 145 mL IVPB, , Intravenous,  Once, 1 of 4 cycles Administration:  (02/22/2019)  for chemotherapy treatment.       CANCER STAGING: Cancer Staging No matching staging information was found for the patient.   INTERVAL HISTORY:  Ms. Broussard 63 y.o. female returns for routine follow-up. She is here today with family. She states that she has had some back pain lately. States that she has some constipation and will start taking stool softeners. Denies any nausea, vomiting, or diarrhea. Denies any new pains. Had not noticed any recent bleeding such as epistaxis, hematuria or hematochezia. Denies recent chest pain on exertion, shortness of breath on minimal exertion, pre-syncopal episodes, or palpitations. Denies any  numbness or tingling in hands or feet. Denies any recent fevers, infections, or recent hospitalizations. Patient reports appetite at 25% and energy level at 25%.      REVIEW OF SYSTEMS:  Review of Systems  Gastrointestinal: Positive for constipation.     PAST MEDICAL/SURGICAL HISTORY:  Past Medical History:  Diagnosis Date  . Anxiety   . Arthritis   . Depression   . Hypertension    Past Surgical History:  Procedure Laterality Date  . COLONOSCOPY N/A 10/23/2016   Procedure: COLONOSCOPY;  Surgeon: Danie Binder, MD;  Location: [facility];  Service: Endoscopy;  Laterality: N/A;  11:30 Am  . POLYPECTOMY  10/23/2016   Procedure: POLYPECTOMY;  Surgeon: Danie Binder, MD;  Location: [facility];  Service: Endoscopy;;  sigmoid colon polyp  . PORTACATH PLACEMENT Left 02/16/2019   Procedure: INSERTION PORT-A-CATH (attached catheter in left subclavian);  Surgeon: Virl Cagey, MD;  Location: [facility]S;  Service: General;  Laterality: Left;  . RT HIP SURGERY    . TOTAL HIP ARTHROPLASTY Left 04/07/2016   Procedure: LEFT TOTAL HIP ARTHROPLASTY ANTERIOR APPROACH;  Surgeon: Paralee Cancel, MD;  Location: [facility];  Service: Orthopedics;  Laterality: Left;  Unsuccessful for Spinal, went to General     SOCIAL HISTORY:  Social History   Socioeconomic History  . Marital status: Single    Spouse name: Not on file  . Number of children: Not on file  . Years of education: Not on file  . Highest education level: Not on file  Occupational History  . Not on file  Social Needs  . Financial resource strain:  Not hard at all  . Food insecurity:    Worry: Never true    Inability: Never true  . Transportation needs:    Medical: No    Non-medical: No  Tobacco Use  . Smoking status: Current Every Day Smoker    Packs/day: 0.25    Years: 42.00    Pack years: 10.50  . Smokeless tobacco: Never Used  Substance and Sexual Activity  . Alcohol use: No  . Drug use: No  . Sexual activity: Not  Currently  Lifestyle  . Physical activity:    Days per week: 0 days    Minutes per session: 0 min  . Stress: Only a little  Relationships  . Social connections:    Talks on phone: Three times a week    Gets together: Three times a week    Attends religious service: More than 4 times per year    Active member of club or organization: Yes    Attends meetings of clubs or organizations: More than 4 times per year    Relationship status: Never married  . Intimate partner violence:    Fear of current or ex partner: No    Emotionally abused: No    Physically abused: No    Forced sexual activity: No  Other Topics Concern  . Not on file  Social History Narrative  . Not on file    FAMILY HISTORY:  Family History  Problem Relation Age of Onset  . Hypertension Mother   . Cancer Father   . Hypertension Brother   . Cancer Brother   . Hypertension Sister   . Cancer Sister     CURRENT MEDICATIONS:  Outpatient Encounter Medications as of 02/22/2019  Medication Sig  . amLODIPine-Valsartan-HCTZ 10-320-25 MG TABS Take 1 tablet by mouth daily.  Marland Kitchen atorvastatin (LIPITOR) 20 MG tablet Take 20 mg by mouth 3 (three) times a week.   Marland Kitchen CISPLATIN IV Inject into the vein. Day 1, 8 every 21 days  . doxazosin (CARDURA) 2 MG tablet Take 2 mg by mouth every evening.   Marland Kitchen GEMCITABINE HCL IV Inject into the vein. Day 1, 8 every 21 days  . HYDROcodone-acetaminophen (NORCO) 5-325 MG tablet Take 1 tablet by mouth every 4 (four) hours as needed for moderate pain.  Marland Kitchen lidocaine-prilocaine (EMLA) cream Apply to skin over port a cath one hour prior to chemotherapy appointment  . naproxen sodium (ALEVE) 220 MG tablet Take 220 mg by mouth 2 (two) times daily as needed.   . prochlorperazine (COMPAZINE) 10 MG tablet Take 1 tablet (10 mg total) by mouth every 6 (six) hours as needed (Nausea or vomiting).  . vitamin B-12 (CYANOCOBALAMIN) 50 MCG tablet Take 50 mcg by mouth daily.   No facility-administered encounter  medications on file as of 02/22/2019.     ALLERGIES:  No Known Allergies   PHYSICAL EXAM:  ECOG Performance status: 1  Vitals:   02/22/19 0830  BP: (!) 151/74  Pulse: 64  Resp: 18  Temp: 97.8 F (36.6 C)  SpO2: 100%   Filed Weights   02/22/19 0830  Weight: 209 lb 12.8 oz (95.2 kg)    Physical Exam Constitutional:      Appearance: Normal appearance.  Cardiovascular:     Rate and Rhythm: Normal rate and regular rhythm.     Pulses: Normal pulses.     Heart sounds: Normal heart sounds.  Pulmonary:     Breath sounds: Normal breath sounds.  Abdominal:  General: Bowel sounds are normal. There is no distension.     Palpations: Abdomen is soft.  Musculoskeletal:        General: No swelling.  Skin:    General: Skin is warm.  Neurological:     General: No focal deficit present.     Mental Status: She is alert and oriented to person, place, and time.  Psychiatric:        Mood and Affect: Mood normal.        Behavior: Behavior normal.      LABORATORY DATA:  I have reviewed the labs as listed.  CBC    Component Value Date/Time   WBC 4.7 02/22/2019 0820   RBC 3.81 (L) 02/22/2019 0820   HGB 11.9 (L) 02/22/2019 0820   HCT 35.2 (L) 02/22/2019 0820   PLT 162 02/22/2019 0820   MCV 92.4 02/22/2019 0820   MCH 31.2 02/22/2019 0820   MCHC 33.8 02/22/2019 0820   RDW 14.0 02/22/2019 0820   LYMPHSABS 1.9 02/22/2019 0820   MONOABS 0.3 02/22/2019 0820   EOSABS 0.2 02/22/2019 0820   BASOSABS 0.0 02/22/2019 0820   CMP Latest Ref Rng & Units 02/22/2019 02/16/2019 01/31/2019  Glucose 70 - 99 mg/dL 115(H) 100(H) 114(H)  BUN 8 - 23 mg/dL 14 - 17  Creatinine 0.44 - 1.00 mg/dL 0.96 - 1.04(H)  Sodium 135 - 145 mmol/L 137 141 139  Potassium 3.5 - 5.1 mmol/L 3.2(L) 3.5 2.8(L)  Chloride 98 - 111 mmol/L 101 - 103  CO2 22 - 32 mmol/L 26 - 27  Calcium 8.9 - 10.3 mg/dL 9.7 - 9.4  Total Protein 6.5 - 8.1 g/dL 7.7 - 7.2  Total Bilirubin 0.3 - 1.2 mg/dL 0.7 - 0.8  Alkaline Phos 38 -  126 U/L 277(H) - 233(H)  AST 15 - 41 U/L 36 - 45(H)  ALT 0 - 44 U/L 40 - 65(H)       DIAGNOSTIC IMAGING:  I have independently reviewed the scans and discussed with the patient.   I have reviewed Venita Lick LPN's note and agree with the documentation.  I personally performed a face-to-face visit, made revisions and my assessment and plan is as follows.    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  Cholangiocarcinoma: - Presentation to the ER on 12/29/2018 with on and off abdominal pain, nausea, constipation for the last 1 year.  No weight loss.  - Colonoscopy on 10/23/2016, 6 mm tubular adenoma in the mid sigmoid colon, internal and external hemorrhoids. -Mammogram on 07/03/2015, BI-RADS 1.  Patient smokes 1 pack of cigarettes over 2 weeks since age 63. -CT scan of the abdomen and pelvis with contrast on 12/29/2018 showed a 1.6 x 3.3 cm infiltrative mass within the liver abutting the gallbladder fundus.  There is mild dilation of the biliary tree.  There is a 3.2 x 3.2 cm confluent mass/enlarged lymph node in the porta hepatis.  No other abnormalities were seen. - PET CT scan on 01/17/2019 shows hepatic hypermetabolism corresponding to gallbladder fundus with SUV of 9.1.  Focus of central right hepatic lobe hypermetabolism likely corresponds to the site of intrahepatic ductal obstruction on the prior diagnostic CT with SUV of 8.3.  Hypermetabolism corresponding to the porta hepatis presumed nodal mass measuring 3.3 cm. - Core biopsy of the right liver mass consistent with well-differentiated adenocarcinoma, positive for CK7 and CDX 2.  CK20 and TTF-1 are negative.  Differential diagnosis includes an upper GI/pancreaticobiliary primary. - We had  a long discussion about the diagnosis and normal prognosis of advanced cholangiocarcinoma.  Treatment intent is palliative. -We talked about chemotherapy regimen containing gemcitabine and cisplatin day 1, day 8 every 21  days regimen.  We talked about the various side effects in detail.  Given her variable kidney function, I will bring her back for the next 2 days for hydration after each treatment.  I have told her to take Compazine every 6 hours for the next 2 days. -I have sent foundation 1 testing as cholangiocarcinoma several high incidence of IDH 1, FGF amplification and B-other mutations. - I will see her back in 1 week for day treatment.  2.  Family history: -Brother had colon cancer.  Sister had breast cancer and father had prostate cancer. -Patient lives by herself and does not have any children.  Her sister lives next door.  Total time spent is 40 minutes with more than 50% of the time spent face-to-face discussing treatment regimen, side effects, management of side effects and coordination of care.    Orders placed this encounter:  Orders Placed This Encounter  Procedures  . CBC with Differential/Platelet  . Comprehensive metabolic panel      Derek Jack, MD [place] [phone]

## 2019-02-22 NOTE — Patient Instructions (Signed)
[facility] at [facility] Discharge Instructions  Labs drawn from portacath today   Thank you for choosing [facility] at [facility] to provide your oncology and hematology care.  To afford each patient quality time with our provider, please arrive at least 15 minutes before your scheduled appointment time.   If you have a lab appointment with the Cancer Center please come in thru the  Main Entrance and check in at the main information desk  You need to re-schedule your appointment should you arrive 10 or more minutes late.  We strive to give you quality time with our providers, and arriving late affects you and other patients whose appointments are after yours.  Also, if you no show three or more times for appointments you may be dismissed from the clinic at the providers discretion.     Again, thank you for choosing [facility].  Our hope is that these requests will decrease the amount of time that you wait before being seen by our physicians.       _____________________________________________________________  Should you have questions after your visit to  Cancer Center, please contact our office at [phone] between the hours of 8:00 a.m. and 4:30 p.m.  Voicemails left after 4:00 p.m. will not be returned until the following business day.  For prescription refill requests, have your pharmacy contact our office and allow 72 hours.    Cancer Center Support Programs:   > Cancer Support Group  2nd Tuesday of the month 1pm-2pm, Journey Room   

## 2019-02-22 NOTE — Patient Instructions (Signed)
[facility] Discharge Instructions for Patients Receiving Chemotherapy  Today you received the following chemotherapy agents  If you develop nausea and vomiting that is not controlled by your nausea medication, call the clinic.   BELOW ARE SYMPTOMS THAT SHOULD BE REPORTED IMMEDIATELY:  *FEVER GREATER THAN 100.5 F  *CHILLS WITH OR WITHOUT FEVER  NAUSEA AND VOMITING THAT IS NOT CONTROLLED WITH YOUR NAUSEA MEDICATION  *UNUSUAL SHORTNESS OF BREATH  *UNUSUAL BRUISING OR BLEEDING  TENDERNESS IN MOUTH AND THROAT WITH OR WITHOUT PRESENCE OF ULCERS  *URINARY PROBLEMS  *BOWEL PROBLEMS  UNUSUAL RASH Items with * indicate a potential emergency and should be followed up as soon as possible.  Feel free to call the clinic should you have any questions or concerns. The clinic phone number is [phone].  Please show the CHEMO ALERT CARD at check-in to the Emergency Department and triage nurse.   

## 2019-02-22 NOTE — Assessment & Plan Note (Addendum)
1.  Cholangiocarcinoma: - Presentation to the ER on 12/29/2018 with on and off abdominal pain, nausea, constipation for the last 1 year.  No weight loss.  - Colonoscopy on 10/23/2016, 6 mm tubular adenoma in the mid sigmoid colon, internal and external hemorrhoids. -Mammogram on 07/03/2015, BI-RADS 1.  Patient smokes 1 pack of cigarettes over 2 weeks since age 63. -CT scan of the abdomen and pelvis with contrast on 12/29/2018 showed a 1.6 x 3.3 cm infiltrative mass within the liver abutting the gallbladder fundus.  There is mild dilation of the biliary tree.  There is a 3.2 x 3.2 cm confluent mass/enlarged lymph node in the porta hepatis.  No other abnormalities were seen. - PET CT scan on 01/17/2019 shows hepatic hypermetabolism corresponding to gallbladder fundus with SUV of 9.1.  Focus of central right hepatic lobe hypermetabolism likely corresponds to the site of intrahepatic ductal obstruction on the prior diagnostic CT with SUV of 8.3.  Hypermetabolism corresponding to the porta hepatis presumed nodal mass measuring 3.3 cm. - Core biopsy of the right liver mass consistent with well-differentiated adenocarcinoma, positive for CK7 and CDX 2.  CK20 and TTF-1 are negative.  Differential diagnosis includes an upper GI/pancreaticobiliary primary. - We had a long discussion about the diagnosis and normal prognosis of advanced cholangiocarcinoma.  Treatment intent is palliative. -We talked about chemotherapy regimen containing gemcitabine and cisplatin day 1, day 8 every 21 days regimen.  We talked about the various side effects in detail.  Given her variable kidney function, I will bring her back for the next 2 days for hydration after each treatment.  I have told her to take Compazine every 6 hours for the next 2 days. -I have sent foundation 1 testing as cholangiocarcinoma several high incidence of IDH 1, FGF amplification and B-other mutations. - I will see her back in 1 week for day treatment.  2.  Family  history: -Brother had colon cancer.  Sister had breast cancer and father had prostate cancer. -Patient lives by herself and does not have any children.  Her sister lives next door.

## 2019-02-23 ENCOUNTER — Inpatient Hospital Stay (HOSPITAL_COMMUNITY): Payer: Medicare Other

## 2019-02-23 ENCOUNTER — Encounter (HOSPITAL_COMMUNITY): Payer: Self-pay

## 2019-02-23 VITALS — BP 149/75 | HR 59 | Temp 97.7°F | Resp 18

## 2019-02-23 DIAGNOSIS — E876 Hypokalemia: Secondary | ICD-10-CM

## 2019-02-23 DIAGNOSIS — C221 Intrahepatic bile duct carcinoma: Secondary | ICD-10-CM | POA: Diagnosis not present

## 2019-02-23 LAB — CEA: CEA: 44.2 ng/mL — ABNORMAL HIGH (ref 0.0–4.7)

## 2019-02-23 MED ORDER — SODIUM CHLORIDE 0.9% FLUSH
10.0000 mL | INTRAVENOUS | Status: DC | PRN
  Administered 2019-02-23: 10 mL via INTRAVENOUS
  Filled 2019-02-23: qty 10

## 2019-02-23 MED ORDER — HEPARIN SOD (PORK) LOCK FLUSH 100 UNIT/ML IV SOLN
500.0000 [IU] | Freq: Once | INTRAVENOUS | Status: AC
  Administered 2019-02-23: 500 [IU] via INTRAVENOUS

## 2019-02-23 MED ORDER — SODIUM CHLORIDE 0.9 % IV SOLN
Freq: Once | INTRAVENOUS | Status: AC
  Administered 2019-02-23: 11:00:00 via INTRAVENOUS
  Filled 2019-02-23: qty 10

## 2019-02-23 NOTE — Progress Notes (Signed)
24 hour Chemo F/U : Pt reports doing well since receiving chemotherapy yesterday. She denies any N+V, fever or any other issues. Pt instructed to call us for any problems or questions and verbalized understanding

## 2019-02-23 NOTE — Progress Notes (Signed)
Nicole Ibarra tolerated hydration with potassium and magnesium well without complaints or incident. VSS upon discharge. Pt discharged self ambulatory in satisfactory condition accompanied by family member

## 2019-02-23 NOTE — Patient Instructions (Signed)
[facility] at [facility] Discharge Instructions Received IV hydration with potassium and magnesium today. Follow-up as scheduled. Call clinic for any questions or concerns   Thank you for choosing [facility] at [facility] to provide your oncology and hematology care.  To afford each patient quality time with our provider, please arrive at least 15 minutes before your scheduled appointment time.   If you have a lab appointment with the Cancer Center please come in thru the  Main Entrance and check in at the main information desk  You need to re-schedule your appointment should you arrive 10 or more minutes late.  We strive to give you quality time with our providers, and arriving late affects you and other patients whose appointments are after yours.  Also, if you no show three or more times for appointments you may be dismissed from the clinic at the providers discretion.     Again, thank you for choosing [facility].  Our hope is that these requests will decrease the amount of time that you wait before being seen by our physicians.       _____________________________________________________________  Should you have questions after your visit to [facility], please contact our office at [phone] between the hours of 8:00 a.m. and 4:30 p.m.  Voicemails left after 4:00 p.m. will not be returned until the following business day.  For prescription refill requests, have your pharmacy contact our office and allow 72 hours.    Cancer Center Support Programs:   > Cancer Support Group  2nd Tuesday of the month 1pm-2pm, Journey Room   

## 2019-02-24 ENCOUNTER — Encounter (HOSPITAL_COMMUNITY)
Admission: RE | Admit: 2019-02-24 | Discharge: 2019-02-24 | Disposition: A | Discharge status: Home / Self Care | Payer: Medicare Other | Source: Ambulatory Visit | Attending: Hematology | Admitting: Hematology

## 2019-02-24 ENCOUNTER — Encounter (HOSPITAL_COMMUNITY): Payer: Self-pay

## 2019-02-24 ENCOUNTER — Other Ambulatory Visit: Payer: Self-pay

## 2019-02-24 DIAGNOSIS — E876 Hypokalemia: Secondary | ICD-10-CM | POA: Diagnosis not present

## 2019-02-24 DIAGNOSIS — C221 Intrahepatic bile duct carcinoma: Secondary | ICD-10-CM | POA: Diagnosis present

## 2019-02-24 MED ORDER — HEPARIN SOD (PORK) LOCK FLUSH 100 UNIT/ML IV SOLN
500.0000 [IU] | Freq: Once | INTRAVENOUS | Status: AC
  Administered 2019-02-24: 500 [IU] via INTRAVENOUS

## 2019-02-24 MED ORDER — SODIUM CHLORIDE 0.9 % IV SOLN
INTRAVENOUS | Status: AC
  Administered 2019-02-24: 09:00:00 via INTRAVENOUS

## 2019-02-27 ENCOUNTER — Encounter (HOSPITAL_COMMUNITY): Payer: Self-pay | Admitting: Hematology

## 2019-03-01 ENCOUNTER — Inpatient Hospital Stay (HOSPITAL_COMMUNITY): Payer: Medicare Other

## 2019-03-01 ENCOUNTER — Encounter (HOSPITAL_COMMUNITY): Payer: Self-pay | Admitting: Hematology

## 2019-03-01 ENCOUNTER — Inpatient Hospital Stay (HOSPITAL_BASED_OUTPATIENT_CLINIC_OR_DEPARTMENT_OTHER): Payer: Medicare Other | Admitting: Hematology

## 2019-03-01 ENCOUNTER — Other Ambulatory Visit (HOSPITAL_COMMUNITY): Payer: Medicare Other

## 2019-03-01 ENCOUNTER — Other Ambulatory Visit: Payer: Self-pay

## 2019-03-01 VITALS — BP 148/70 | HR 73 | Temp 98.2°F | Resp 18

## 2019-03-01 VITALS — Wt 209.0 lb

## 2019-03-01 DIAGNOSIS — K829 Disease of gallbladder, unspecified: Secondary | ICD-10-CM | POA: Diagnosis not present

## 2019-03-01 DIAGNOSIS — I10 Essential (primary) hypertension: Secondary | ICD-10-CM

## 2019-03-01 DIAGNOSIS — Z803 Family history of malignant neoplasm of breast: Secondary | ICD-10-CM

## 2019-03-01 DIAGNOSIS — F419 Anxiety disorder, unspecified: Secondary | ICD-10-CM

## 2019-03-01 DIAGNOSIS — C221 Intrahepatic bile duct carcinoma: Secondary | ICD-10-CM | POA: Diagnosis not present

## 2019-03-01 DIAGNOSIS — K5900 Constipation, unspecified: Secondary | ICD-10-CM

## 2019-03-01 DIAGNOSIS — F17210 Nicotine dependence, cigarettes, uncomplicated: Secondary | ICD-10-CM

## 2019-03-01 DIAGNOSIS — E876 Hypokalemia: Secondary | ICD-10-CM

## 2019-03-01 DIAGNOSIS — Z800 Family history of malignant neoplasm of digestive organs: Secondary | ICD-10-CM

## 2019-03-01 DIAGNOSIS — C787 Secondary malignant neoplasm of liver and intrahepatic bile duct: Principal | ICD-10-CM

## 2019-03-01 DIAGNOSIS — Z8042 Family history of malignant neoplasm of prostate: Secondary | ICD-10-CM

## 2019-03-01 DIAGNOSIS — Z79899 Other long term (current) drug therapy: Secondary | ICD-10-CM

## 2019-03-01 LAB — CBC WITH DIFFERENTIAL/PLATELET
Abs Immature Granulocytes: 0 10*3/uL (ref 0.00–0.07)
Basophils Absolute: 0 10*3/uL (ref 0.0–0.1)
Basophils Relative: 1 %
Eosinophils Absolute: 0.1 10*3/uL (ref 0.0–0.5)
Eosinophils Relative: 4 %
HCT: 31.1 % — ABNORMAL LOW (ref 36.0–46.0)
Hemoglobin: 10.6 g/dL — ABNORMAL LOW (ref 12.0–15.0)
Immature Granulocytes: 0 %
LYMPHS ABS: 2 10*3/uL (ref 0.7–4.0)
Lymphocytes Relative: 61 %
MCH: 31.3 pg (ref 26.0–34.0)
MCHC: 34.1 g/dL (ref 30.0–36.0)
MCV: 91.7 fL (ref 80.0–100.0)
Monocytes Absolute: 0.1 10*3/uL (ref 0.1–1.0)
Monocytes Relative: 2 %
Neutro Abs: 1.1 10*3/uL — ABNORMAL LOW (ref 1.7–7.7)
Neutrophils Relative %: 32 %
Platelets: 125 10*3/uL — ABNORMAL LOW (ref 150–400)
RBC: 3.39 MIL/uL — ABNORMAL LOW (ref 3.87–5.11)
RDW: 13.6 % (ref 11.5–15.5)
WBC: 3.3 10*3/uL — ABNORMAL LOW (ref 4.0–10.5)
nRBC: 0 % (ref 0.0–0.2)

## 2019-03-01 LAB — COMPREHENSIVE METABOLIC PANEL WITH GFR
ALT: 51 U/L — ABNORMAL HIGH (ref 0–44)
AST: 34 U/L (ref 15–41)
Albumin: 3.7 g/dL (ref 3.5–5.0)
Alkaline Phosphatase: 291 U/L — ABNORMAL HIGH (ref 38–126)
Anion gap: 6 (ref 5–15)
BUN: 18 mg/dL (ref 8–23)
CO2: 27 mmol/L (ref 22–32)
Calcium: 9.2 mg/dL (ref 8.9–10.3)
Chloride: 105 mmol/L (ref 98–111)
Creatinine, Ser: 0.98 mg/dL (ref 0.44–1.00)
GFR calc Af Amer: >60 mL/min
GFR calc non Af Amer: >60 mL/min
Glucose, Bld: 103 mg/dL — ABNORMAL HIGH (ref 70–99)
Potassium: 3.4 mmol/L — ABNORMAL LOW (ref 3.5–5.1)
Sodium: 138 mmol/L (ref 135–145)
Total Bilirubin: 0.6 mg/dL (ref 0.3–1.2)
Total Protein: 7.1 g/dL (ref 6.5–8.1)

## 2019-03-01 MED ORDER — SODIUM CHLORIDE 0.9 % IV SOLN
25.0000 mg/m2 | Freq: Once | INTRAVENOUS | Status: AC
  Administered 2019-03-01: 52 mg via INTRAVENOUS
  Filled 2019-03-01: qty 52

## 2019-03-01 MED ORDER — SODIUM CHLORIDE 0.9 % IV SOLN
Freq: Once | INTRAVENOUS | Status: AC
  Administered 2019-03-01: 11:00:00 via INTRAVENOUS
  Filled 2019-03-01: qty 5

## 2019-03-01 MED ORDER — POTASSIUM CHLORIDE 2 MEQ/ML IV SOLN
Freq: Once | INTRAVENOUS | Status: AC
  Administered 2019-03-01: 09:00:00 via INTRAVENOUS
  Filled 2019-03-01: qty 10

## 2019-03-01 MED ORDER — SODIUM CHLORIDE 0.9% FLUSH
10.0000 mL | INTRAVENOUS | Status: DC | PRN
  Administered 2019-03-01: 10 mL
  Filled 2019-03-01: qty 10

## 2019-03-01 MED ORDER — SODIUM CHLORIDE 0.9 % IV SOLN
2000.0000 mg | Freq: Once | INTRAVENOUS | Status: AC
  Administered 2019-03-01: 2000 mg via INTRAVENOUS
  Filled 2019-03-01: qty 52.6

## 2019-03-01 MED ORDER — SODIUM CHLORIDE 0.9 % IV SOLN: Freq: Once | INTRAVENOUS | Status: AC

## 2019-03-01 MED ORDER — PALONOSETRON HCL INJECTION 0.25 MG/5ML
0.2500 mg | Freq: Once | INTRAVENOUS | Status: AC
  Administered 2019-03-01: 0.25 mg via INTRAVENOUS
  Filled 2019-03-01: qty 5

## 2019-03-01 MED ORDER — SODIUM CHLORIDE 0.9 % IV SOLN
Freq: Once | INTRAVENOUS | Status: AC
  Administered 2019-03-01: 09:00:00 via INTRAVENOUS

## 2019-03-01 MED ORDER — HEPARIN SOD (PORK) LOCK FLUSH 100 UNIT/ML IV SOLN
500.0000 [IU] | Freq: Once | INTRAVENOUS | Status: AC | PRN
  Administered 2019-03-01: 500 [IU]

## 2019-03-01 NOTE — Progress Notes (Signed)
Labs reviewed with MD. ANC 1.1, MD aware. Will proceed with treatment today.   Treatment given per orders. Patient tolerated it well without problems. Vitals stable and discharged home from clinic ambulatory. Follow up as scheduled.

## 2019-03-01 NOTE — Patient Instructions (Addendum)
[facility] at [facility] Discharge Instructions  You were seen today by Dr. Delton Coombes. He went over how you've been feeling since your treatment. He recommends that you continue to stay home and distance yourself from others. Take your nausea medication every 6 hours for the next 2 days, starting when you get home. Start drinking Boost or Ensure 2-3 times a day. He will see you back in 2 weeks for labs, treatment and follow up.   Thank you for choosing [place] at [facility] to provide your oncology and hematology care.  To afford each patient quality time with our provider, please arrive at least 15 minutes before your scheduled appointment time.   If you have a lab appointment with the [facility] please come in thru the  Main Entrance and check in at the main information desk  You need to re-schedule your appointment should you arrive 10 or more minutes late.  We strive to give you quality time with our providers, and arriving late affects you and other patients whose appointments are after yours.  Also, if you no show three or more times for appointments you may be dismissed from the clinic at the providers discretion.     Again, thank you for choosing [facility].  Our hope is that these requests will decrease the amount of time that you wait before being seen by our physicians.       _____________________________________________________________  Should you have questions after your visit to [facility], please contact our office at [phone] between the hours of 8:00 a.m. and 4:30 p.m.  Voicemails left after 4:00 p.m. will not be returned until the following business day.  For prescription refill requests, have your pharmacy contact our office and allow 72 hours.    Cancer Center Support Programs:   > Cancer Support Group  2nd Tuesday of the month 1pm-2pm, Journey Room

## 2019-03-01 NOTE — Assessment & Plan Note (Signed)
1.  Cholangiocarcinoma: - Presentation to the ER on 12/29/2018 with on and off abdominal pain, nausea, constipation for the last 1 year.  No weight loss.  - Colonoscopy on 10/23/2016, 6 mm tubular adenoma in the mid sigmoid colon, internal and external hemorrhoids. -Mammogram on 07/03/2015, BI-RADS 1.  Patient smokes 1 pack of cigarettes over 2 weeks since age 63. -CT scan of the abdomen and pelvis with contrast on 12/29/2018 showed a 1.6 x 3.3 cm infiltrative mass within the liver abutting the gallbladder fundus.  There is mild dilation of the biliary tree.  There is a 3.2 x 3.2 cm confluent mass/enlarged lymph node in the porta hepatis.  No other abnormalities were seen. - PET CT scan on 01/17/2019 shows hepatic hypermetabolism corresponding to gallbladder fundus with SUV of 9.1.  Focus of central right hepatic lobe hypermetabolism likely corresponds to the site of intrahepatic ductal obstruction on the prior diagnostic CT with SUV of 8.3.  Hypermetabolism corresponding to the porta hepatis presumed nodal mass measuring 3.3 cm. - Core biopsy of the right liver mass consistent with well-differentiated adenocarcinoma, positive for CK7 and CDX 2.  CK20 and TTF-1 are negative.  Differential diagnosis includes an upper GI/pancreaticobiliary primary. -Cycle 1 day 1 of chemotherapy with gemcitabine and cisplatin started on 02/22/2019. -She had very minor nausea and constipation.  She had some numbness in the fingertips which lasted only a few seconds. -I have reviewed her blood work today.  ANC is 1100.  She may proceed with day 8 of treatment without any dose modifications. -I have sent foundation 1 testing as cholangiocarcinoma has high incidence of IDH 1, FGF amplification and BRAF mutations. -She will be here for IV fluids for the next 2 days.  She was told to take Compazine for the next 2 days. -She will come back in 2 weeks for follow-up with me to start cycle 2.  We will obtain a CEA level.   2.  Family  history: -Brother had colon cancer.  Sister had breast cancer and father had prostate cancer. -Patient lives by herself and does not have any children.  Her sister lives next door.

## 2019-03-01 NOTE — Progress Notes (Signed)
[address], [place] [address]   CLINIC:  Medical Oncology/Hematology  PCP:  Cory Munch, [place] [address] [phone]   REASON FOR VISIT:  Follow-up for adenocarcinoma of theliver andgallbladder mass with lymph node involvement  CURRENT THERAPY:Cisplatin/gemcitabine days 1, 8 every 21 days.   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[place])    02/22/2019 -  Chemotherapy    The patient had palonosetron (ALOXI) injection 0.25 mg, 0.25 mg, Intravenous,  Once, 1 of 4 cycles Administration: 0.25 mg (02/22/2019), 0.25 mg (03/01/2019) pegfilgrastim-cbqv (UDENYCA) injection 6 mg, 6 mg, Subcutaneous, Once, 1 of 4 cycles CISplatin (PLATINOL) 52 mg in sodium chloride 0.9 % 250 mL chemo infusion, 25 mg/m2 = 52 mg, Intravenous,  Once, 1 of 4 cycles Administration: 52 mg (02/22/2019), 52 mg (03/01/2019) gemcitabine (GEMZAR) 2,000 mg in sodium chloride 0.9 % 250 mL chemo infusion, 2,090 mg, Intravenous,  Once, 1 of 4 cycles Administration: 2,000 mg (02/22/2019), 2,000 mg (03/01/2019) fosaprepitant (EMEND) 150 mg, dexamethasone (DECADRON) 12 mg in sodium chloride 0.9 % 145 mL IVPB, , Intravenous,  Once, 1 of 4 cycles Administration:  (02/22/2019),  (03/01/2019)  for chemotherapy treatment.       CANCER STAGING: Cancer Staging No matching staging information was found for the patient.   INTERVAL HISTORY:  Nicole Ibarra 63 y.o. female returns for routine follow-up and consideration for next cycle of chemotherapy. She is here today alone. She states that she has constipation at times. She states that she has mild numbness in her fingertips, comes and goes. She states that she has noticed some night sweats, for the last two nights. Denies any vomiting, or diarrhea. Denies any new pains. Had not noticed any recent  bleeding such as epistaxis, hematuria or hematochezia. Denies recent chest pain on exertion, shortness of breath on minimal exertion, pre-syncopal episodes, or palpitations. Denies any numbness or tingling in feet. Denies any recent fevers, infections, or recent hospitalizations. Patient reports appetite at 50% and energy level at 50%.     REVIEW OF SYSTEMS:  Review of Systems  Gastrointestinal: Positive for constipation.     PAST MEDICAL/SURGICAL HISTORY:  Past Medical History:  Diagnosis Date  . Anxiety   . Arthritis   . Depression   . Hypertension    Past Surgical History:  Procedure Laterality Date  . COLONOSCOPY N/A 10/23/2016   Procedure: COLONOSCOPY;  Surgeon: Danie Binder, MD;  Location: [facility];  Service: Endoscopy;  Laterality: N/A;  11:30 Am  . POLYPECTOMY  10/23/2016   Procedure: POLYPECTOMY;  Surgeon: Danie Binder, MD;  Location: [facility];  Service: Endoscopy;;  sigmoid colon polyp  . PORTACATH PLACEMENT Left 02/16/2019   Procedure: INSERTION PORT-A-CATH (attached catheter in left subclavian);  Surgeon: Virl Cagey, MD;  Location: [facility]S;  Service: General;  Laterality: Left;  . RT HIP SURGERY    . TOTAL HIP ARTHROPLASTY Left 04/07/2016   Procedure: LEFT TOTAL HIP ARTHROPLASTY ANTERIOR APPROACH;  Surgeon: Paralee Cancel, MD;  Location: [facility];  Service: Orthopedics;  Laterality: Left;  Unsuccessful for Spinal, went to General     SOCIAL HISTORY:  Social History   Socioeconomic History  . Marital status: Single    Spouse name: Not on file  . Number of children: Not on file  . Years of education: Not on file  .  Highest education level: Not on file  Occupational History  . Not on file  Social Needs  . Financial resource strain: Not hard at all  . Food insecurity:    Worry: Never true    Inability: Never true  . Transportation needs:    Medical: No    Non-medical: No  Tobacco Use  . Smoking status: Current Every Day Smoker    Packs/day:  0.25    Years: 42.00    Pack years: 10.50  . Smokeless tobacco: Never Used  Substance and Sexual Activity  . Alcohol use: No  . Drug use: No  . Sexual activity: Not Currently  Lifestyle  . Physical activity:    Days per week: 0 days    Minutes per session: 0 min  . Stress: Only a little  Relationships  . Social connections:    Talks on phone: Three times a week    Gets together: Three times a week    Attends religious service: More than 4 times per year    Active member of club or organization: Yes    Attends meetings of clubs or organizations: More than 4 times per year    Relationship status: Never married  . Intimate partner violence:    Fear of current or ex partner: No    Emotionally abused: No    Physically abused: No    Forced sexual activity: No  Other Topics Concern  . Not on file  Social History Narrative  . Not on file    FAMILY HISTORY:  Family History  Problem Relation Age of Onset  . Hypertension Mother   . Cancer Father   . Hypertension Brother   . Cancer Brother   . Hypertension Sister   . Cancer Sister     CURRENT MEDICATIONS:  Outpatient Encounter Medications as of 03/01/2019  Medication Sig  . amLODIPine-Valsartan-HCTZ 10-320-25 MG TABS Take 1 tablet by mouth daily.  Marland Kitchen atorvastatin (LIPITOR) 20 MG tablet Take 20 mg by mouth 3 (three) times a week.   Marland Kitchen CISPLATIN IV Inject into the vein. Day 1, 8 every 21 days  . doxazosin (CARDURA) 2 MG tablet Take 2 mg by mouth every evening.   Marland Kitchen GEMCITABINE HCL IV Inject into the vein. Day 1, 8 every 21 days  . HYDROcodone-acetaminophen (NORCO) 5-325 MG tablet Take 1 tablet by mouth every 4 (four) hours as needed for moderate pain.  Marland Kitchen lidocaine-prilocaine (EMLA) cream Apply to skin over port a cath one hour prior to chemotherapy appointment  . naproxen sodium (ALEVE) 220 MG tablet Take 220 mg by mouth 2 (two) times daily as needed.   . prochlorperazine (COMPAZINE) 10 MG tablet Take 1 tablet (10 mg total) by  mouth every 6 (six) hours as needed (Nausea or vomiting).  . vitamin B-12 (CYANOCOBALAMIN) 50 MCG tablet Take 50 mcg by mouth daily.   No facility-administered encounter medications on file as of 03/01/2019.     ALLERGIES:  No Known Allergies   PHYSICAL EXAM:  ECOG Performance status: 1  There were no vitals filed for this visit. Filed Weights   03/01/19 0800  Weight: 209 lb (94.8 kg)    Physical Exam Vitals signs reviewed.  Constitutional:      Appearance: Normal appearance.  Cardiovascular:     Rate and Rhythm: Normal rate and regular rhythm.     Heart sounds: Normal heart sounds.  Pulmonary:     Effort: Pulmonary effort is normal.     Breath sounds: Normal  breath sounds.  Abdominal:     General: Abdomen is flat.     Palpations: Abdomen is soft.  Musculoskeletal:        General: No swelling.  Skin:    General: Skin is warm.  Neurological:     General: No focal deficit present.     Mental Status: She is alert and oriented to person, place, and time.  Psychiatric:        Mood and Affect: Mood normal.        Behavior: Behavior normal.      LABORATORY DATA:  I have reviewed the labs as listed.  CBC    Component Value Date/Time   WBC 3.3 (L) 03/01/2019 0747   RBC 3.39 (L) 03/01/2019 0747   HGB 10.6 (L) 03/01/2019 0747   HCT 31.1 (L) 03/01/2019 0747   PLT 125 (L) 03/01/2019 0747   MCV 91.7 03/01/2019 0747   MCH 31.3 03/01/2019 0747   MCHC 34.1 03/01/2019 0747   RDW 13.6 03/01/2019 0747   LYMPHSABS 2.0 03/01/2019 0747   MONOABS 0.1 03/01/2019 0747   EOSABS 0.1 03/01/2019 0747   BASOSABS 0.0 03/01/2019 0747   CMP Latest Ref Rng & Units 03/01/2019 02/22/2019 02/16/2019  Glucose 70 - 99 mg/dL 103(H) 115(H) 100(H)  BUN 8 - 23 mg/dL 18 14 -  Creatinine 0.44 - 1.00 mg/dL 0.98 0.96 -  Sodium 135 - 145 mmol/L 138 137 141  Potassium 3.5 - 5.1 mmol/L 3.4(L) 3.2(L) 3.5  Chloride 98 - 111 mmol/L 105 101 -  CO2 22 - 32 mmol/L 27 26 -  Calcium 8.9 - 10.3 mg/dL 9.2  9.7 -  Total Protein 6.5 - 8.1 g/dL 7.1 7.7 -  Total Bilirubin 0.3 - 1.2 mg/dL 0.6 0.7 -  Alkaline Phos 38 - 126 U/L 291(H) 277(H) -  AST 15 - 41 U/L 34 36 -  ALT 0 - 44 U/L 51(H) 40 -       DIAGNOSTIC IMAGING:  I have independently reviewed the scans and discussed with the patient.   I have reviewed Venita Lick LPN's note and agree with the documentation.  I personally performed a face-to-face visit, made revisions and my assessment and plan is as follows.    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  Cholangiocarcinoma: - Presentation to the ER on 12/29/2018 with on and off abdominal pain, nausea, constipation for the last 1 year.  No weight loss.  - Colonoscopy on 10/23/2016, 6 mm tubular adenoma in the mid sigmoid colon, internal and external hemorrhoids. -Mammogram on 07/03/2015, BI-RADS 1.  Patient smokes 1 pack of cigarettes over 2 weeks since age 16. -CT scan of the abdomen and pelvis with contrast on 12/29/2018 showed a 1.6 x 3.3 cm infiltrative mass within the liver abutting the gallbladder fundus.  There is mild dilation of the biliary tree.  There is a 3.2 x 3.2 cm confluent mass/enlarged lymph node in the porta hepatis.  No other abnormalities were seen. - PET CT scan on 01/17/2019 shows hepatic hypermetabolism corresponding to gallbladder fundus with SUV of 9.1.  Focus of central right hepatic lobe hypermetabolism likely corresponds to the site of intrahepatic ductal obstruction on the prior diagnostic CT with SUV of 8.3.  Hypermetabolism corresponding to the porta hepatis presumed nodal mass measuring 3.3 cm. - Core biopsy of the right liver mass consistent with well-differentiated adenocarcinoma, positive for CK7 and CDX 2.  CK20 and TTF-1 are negative.  Differential diagnosis includes an upper  GI/pancreaticobiliary primary. -Cycle 1 day 1 of chemotherapy with gemcitabine and cisplatin started on 02/22/2019. -She had very minor nausea and  constipation.  She had some numbness in the fingertips which lasted only a few seconds. -I have reviewed her blood work today.  ANC is 1100.  She may proceed with day 8 of treatment without any dose modifications. -I have sent foundation 1 testing as cholangiocarcinoma has high incidence of IDH 1, FGF amplification and BRAF mutations. -She will be here for IV fluids for the next 2 days.  She was told to take Compazine for the next 2 days. -She will come back in 2 weeks for follow-up with me to start cycle 2.  We will obtain a CEA level.   2.  Family history: -Brother had colon cancer.  Sister had breast cancer and father had prostate cancer. -Patient lives by herself and does not have any children.  Her sister lives next door.  Total time spent is 25 minutes with more than 50% of the time spent face-to-face discussing and reinforcing treatment side effects and coordination of care.    Orders placed this encounter:  Orders Placed This Encounter  Procedures  . CBC with Differential/Platelet  . Comprehensive metabolic panel  . Magnesium  . CEA      Derek Jack, MD [place] [phone]

## 2019-03-01 NOTE — Patient Instructions (Signed)
[facility] Discharge Instructions for Patients Receiving Chemotherapy  Today you received the following chemotherapy agents   To help prevent nausea and vomiting after your treatment, we encourage you to take your nausea medication   If you develop nausea and vomiting that is not controlled by your nausea medication, call the clinic.   BELOW ARE SYMPTOMS THAT SHOULD BE REPORTED IMMEDIATELY:  *FEVER GREATER THAN 100.5 F  *CHILLS WITH OR WITHOUT FEVER  NAUSEA AND VOMITING THAT IS NOT CONTROLLED WITH YOUR NAUSEA MEDICATION  *UNUSUAL SHORTNESS OF BREATH  *UNUSUAL BRUISING OR BLEEDING  TENDERNESS IN MOUTH AND THROAT WITH OR WITHOUT PRESENCE OF ULCERS  *URINARY PROBLEMS  *BOWEL PROBLEMS  UNUSUAL RASH Items with * indicate a potential emergency and should be followed up as soon as possible.  Feel free to call the clinic should you have any questions or concerns. The clinic phone number is [phone].  Please show the CHEMO ALERT CARD at check-in to the Emergency Department and triage nurse.   

## 2019-03-02 ENCOUNTER — Encounter (HOSPITAL_COMMUNITY): Payer: Self-pay

## 2019-03-02 ENCOUNTER — Inpatient Hospital Stay (HOSPITAL_COMMUNITY): Payer: Medicare Other

## 2019-03-02 VITALS — BP 143/68 | HR 72 | Temp 98.0°F | Resp 18 | Wt 215.0 lb

## 2019-03-02 DIAGNOSIS — C221 Intrahepatic bile duct carcinoma: Secondary | ICD-10-CM

## 2019-03-02 DIAGNOSIS — C787 Secondary malignant neoplasm of liver and intrahepatic bile duct: Secondary | ICD-10-CM

## 2019-03-02 DIAGNOSIS — E876 Hypokalemia: Secondary | ICD-10-CM

## 2019-03-02 MED ORDER — PEGFILGRASTIM-CBQV 6 MG/0.6ML ~~LOC~~ SOSY
6.0000 mg | PREFILLED_SYRINGE | Freq: Once | SUBCUTANEOUS | Status: AC
  Administered 2019-03-02: 6 mg via SUBCUTANEOUS

## 2019-03-02 MED ORDER — PEGFILGRASTIM-CBQV 6 MG/0.6ML ~~LOC~~ SOSY
PREFILLED_SYRINGE | SUBCUTANEOUS | Status: AC
  Filled 2019-03-02: qty 0.6

## 2019-03-02 MED ORDER — SODIUM CHLORIDE 0.9 % IV SOLN
Freq: Once | INTRAVENOUS | Status: AC
  Administered 2019-03-02: 11:00:00 via INTRAVENOUS
  Filled 2019-03-02: qty 10

## 2019-03-02 NOTE — Progress Notes (Signed)
Pt presents today for IVF and Udenyca injection. VSS. Pt has no complaints of any changes since the last visit.

## 2019-03-02 NOTE — Progress Notes (Signed)
Udenyca and hydration fluids given per orders. Patient tolerated it well without problems. Vitals stable and discharged home from clinic ambulatory. Follow up as scheduled.

## 2019-03-03 ENCOUNTER — Other Ambulatory Visit: Payer: Self-pay

## 2019-03-03 ENCOUNTER — Encounter (HOSPITAL_COMMUNITY)
Admission: RE | Admit: 2019-03-03 | Discharge: 2019-03-03 | Disposition: A | Discharge status: Home / Self Care | Payer: Medicare Other | Source: Ambulatory Visit | Attending: Hematology | Admitting: Hematology

## 2019-03-03 ENCOUNTER — Encounter (HOSPITAL_COMMUNITY): Payer: Self-pay

## 2019-03-03 DIAGNOSIS — C221 Intrahepatic bile duct carcinoma: Secondary | ICD-10-CM | POA: Diagnosis not present

## 2019-03-03 MED ORDER — SODIUM CHLORIDE 0.9 % IV SOLN
INTRAVENOUS | Status: AC
  Administered 2019-03-03: 12:00:00 via INTRAVENOUS

## 2019-03-03 MED ORDER — HEPARIN SOD (PORK) LOCK FLUSH 100 UNIT/ML IV SOLN
500.0000 [IU] | Freq: Once | INTRAVENOUS | Status: AC
  Administered 2019-03-03: 500 [IU] via INTRAVENOUS

## 2019-03-14 ENCOUNTER — Other Ambulatory Visit: Payer: Self-pay

## 2019-03-15 ENCOUNTER — Inpatient Hospital Stay (HOSPITAL_BASED_OUTPATIENT_CLINIC_OR_DEPARTMENT_OTHER): Payer: Medicare Other | Admitting: Hematology

## 2019-03-15 ENCOUNTER — Encounter (HOSPITAL_COMMUNITY): Payer: Self-pay | Admitting: Hematology

## 2019-03-15 ENCOUNTER — Inpatient Hospital Stay (HOSPITAL_COMMUNITY): Payer: Medicare Other | Attending: Hematology

## 2019-03-15 ENCOUNTER — Inpatient Hospital Stay (HOSPITAL_COMMUNITY): Payer: Medicare Other

## 2019-03-15 ENCOUNTER — Other Ambulatory Visit: Payer: Self-pay

## 2019-03-15 ENCOUNTER — Encounter (HOSPITAL_COMMUNITY): Payer: Self-pay

## 2019-03-15 VITALS — BP 149/69 | HR 72 | Temp 98.6°F | Resp 18 | Wt 211.2 lb

## 2019-03-15 DIAGNOSIS — Z79899 Other long term (current) drug therapy: Secondary | ICD-10-CM | POA: Diagnosis not present

## 2019-03-15 DIAGNOSIS — E875 Hyperkalemia: Secondary | ICD-10-CM | POA: Diagnosis not present

## 2019-03-15 DIAGNOSIS — I10 Essential (primary) hypertension: Secondary | ICD-10-CM | POA: Diagnosis not present

## 2019-03-15 DIAGNOSIS — G893 Neoplasm related pain (acute) (chronic): Secondary | ICD-10-CM | POA: Insufficient documentation

## 2019-03-15 DIAGNOSIS — R630 Anorexia: Secondary | ICD-10-CM | POA: Insufficient documentation

## 2019-03-15 DIAGNOSIS — C787 Secondary malignant neoplasm of liver and intrahepatic bile duct: Principal | ICD-10-CM

## 2019-03-15 DIAGNOSIS — G479 Sleep disorder, unspecified: Secondary | ICD-10-CM | POA: Insufficient documentation

## 2019-03-15 DIAGNOSIS — E785 Hyperlipidemia, unspecified: Secondary | ICD-10-CM | POA: Diagnosis not present

## 2019-03-15 DIAGNOSIS — Z5189 Encounter for other specified aftercare: Secondary | ICD-10-CM | POA: Diagnosis present

## 2019-03-15 DIAGNOSIS — R5383 Other fatigue: Secondary | ICD-10-CM | POA: Diagnosis not present

## 2019-03-15 DIAGNOSIS — K5900 Constipation, unspecified: Secondary | ICD-10-CM | POA: Insufficient documentation

## 2019-03-15 DIAGNOSIS — D6481 Anemia due to antineoplastic chemotherapy: Secondary | ICD-10-CM | POA: Insufficient documentation

## 2019-03-15 DIAGNOSIS — Z5111 Encounter for antineoplastic chemotherapy: Secondary | ICD-10-CM | POA: Diagnosis present

## 2019-03-15 DIAGNOSIS — F17210 Nicotine dependence, cigarettes, uncomplicated: Secondary | ICD-10-CM | POA: Insufficient documentation

## 2019-03-15 DIAGNOSIS — C221 Intrahepatic bile duct carcinoma: Secondary | ICD-10-CM | POA: Insufficient documentation

## 2019-03-15 LAB — COMPREHENSIVE METABOLIC PANEL
ALT: 31 U/L (ref 0–44)
AST: 27 U/L (ref 15–41)
Albumin: 3.6 g/dL (ref 3.5–5.0)
Alkaline Phosphatase: 309 U/L — ABNORMAL HIGH (ref 38–126)
Anion gap: 9 (ref 5–15)
BUN: 22 mg/dL (ref 8–23)
CO2: 26 mmol/L (ref 22–32)
Calcium: 9.1 mg/dL (ref 8.9–10.3)
Chloride: 103 mmol/L (ref 98–111)
Creatinine, Ser: 1.21 mg/dL — ABNORMAL HIGH (ref 0.44–1.00)
GFR calc Af Amer: 55 mL/min — ABNORMAL LOW (ref >60)
GFR calc non Af Amer: 48 mL/min — ABNORMAL LOW (ref >60)
Glucose, Bld: 107 mg/dL — ABNORMAL HIGH (ref 70–99)
Potassium: 3.3 mmol/L — ABNORMAL LOW (ref 3.5–5.1)
Sodium: 138 mmol/L (ref 135–145)
Total Bilirubin: 0.4 mg/dL (ref 0.3–1.2)
Total Protein: 7.2 g/dL (ref 6.5–8.1)

## 2019-03-15 LAB — CBC WITH DIFFERENTIAL/PLATELET
Abs Immature Granulocytes: 0.5 10*3/uL — ABNORMAL HIGH (ref 0.00–0.07)
Basophils Absolute: 0.1 10*3/uL (ref 0.0–0.1)
Basophils Relative: 0 %
Eosinophils Absolute: 0.2 10*3/uL (ref 0.0–0.5)
Eosinophils Relative: 1 %
HCT: 29.1 % — ABNORMAL LOW (ref 36.0–46.0)
Hemoglobin: 9.7 g/dL — ABNORMAL LOW (ref 12.0–15.0)
Immature Granulocytes: 4 %
Lymphocytes Relative: 25 %
Lymphs Abs: 2.9 10*3/uL (ref 0.7–4.0)
MCH: 31.3 pg (ref 26.0–34.0)
MCHC: 33.3 g/dL (ref 30.0–36.0)
MCV: 93.9 fL (ref 80.0–100.0)
Monocytes Absolute: 1 10*3/uL (ref 0.1–1.0)
Monocytes Relative: 9 %
Neutro Abs: 6.8 10*3/uL (ref 1.7–7.7)
Neutrophils Relative %: 61 %
Platelets: 279 10*3/uL (ref 150–400)
RBC: 3.1 MIL/uL — ABNORMAL LOW (ref 3.87–5.11)
RDW: 15.1 % (ref 11.5–15.5)
WBC: 11.3 10*3/uL — ABNORMAL HIGH (ref 4.0–10.5)
nRBC: 0.3 % — ABNORMAL HIGH (ref 0.0–0.2)

## 2019-03-15 LAB — MAGNESIUM: Magnesium: 2 mg/dL (ref 1.7–2.4)

## 2019-03-15 MED ORDER — SODIUM CHLORIDE 0.9 % IV SOLN
Freq: Once | INTRAVENOUS | Status: AC
  Administered 2019-03-15: 10:00:00 via INTRAVENOUS

## 2019-03-15 MED ORDER — SODIUM CHLORIDE 0.9 % IV SOLN: Freq: Once | INTRAVENOUS | Status: DC

## 2019-03-15 MED ORDER — SODIUM CHLORIDE 0.9 % IV SOLN
2000.0000 mg | Freq: Once | INTRAVENOUS | Status: AC
  Administered 2019-03-15: 2000 mg via INTRAVENOUS
  Filled 2019-03-15: qty 52.6

## 2019-03-15 MED ORDER — SODIUM CHLORIDE 0.9% FLUSH
10.0000 mL | INTRAVENOUS | Status: DC | PRN
  Administered 2019-03-15: 10 mL
  Filled 2019-03-15: qty 10

## 2019-03-15 MED ORDER — SODIUM CHLORIDE 0.9 % IV SOLN
Freq: Once | INTRAVENOUS | Status: AC
  Administered 2019-03-15: 10:00:00 via INTRAVENOUS
  Filled 2019-03-15: qty 5

## 2019-03-15 MED ORDER — POTASSIUM CHLORIDE 2 MEQ/ML IV SOLN
Freq: Once | INTRAVENOUS | Status: AC
  Administered 2019-03-15: 08:00:00 via INTRAVENOUS
  Filled 2019-03-15: qty 10

## 2019-03-15 MED ORDER — PALONOSETRON HCL INJECTION 0.25 MG/5ML
0.2500 mg | Freq: Once | INTRAVENOUS | Status: AC
  Administered 2019-03-15: 0.25 mg via INTRAVENOUS
  Filled 2019-03-15: qty 5

## 2019-03-15 MED ORDER — HEPARIN SOD (PORK) LOCK FLUSH 100 UNIT/ML IV SOLN
500.0000 [IU] | Freq: Once | INTRAVENOUS | Status: AC | PRN
  Administered 2019-03-15: 500 [IU]

## 2019-03-15 MED ORDER — SODIUM CHLORIDE 0.9 % IV SOLN
25.0000 mg/m2 | Freq: Once | INTRAVENOUS | Status: AC
  Administered 2019-03-15: 52 mg via INTRAVENOUS
  Filled 2019-03-15: qty 52

## 2019-03-15 NOTE — Patient Instructions (Signed)
[place] at [facility] Discharge Instructions  You were seen today by Dr. Delton Coombes.  He talked with you today about you needing to drink more fluids.  He said that your kidney functions are a little elevated.  We will schedule you for fluids tomorrow.  He talked to you about doing one more treatment cycle and then we will rescan to see how the treatment is working. We will see you back in 1 week with your next treatment.    Thank you for choosing [place] at [facility] to provide your oncology and hematology care.  To afford each patient quality time with our provider, please arrive at least 15 minutes before your scheduled appointment time.   If you have a lab appointment with the [facility] please come in thru the  Main Entrance and check in at the main information desk  You need to re-schedule your appointment should you arrive 10 or more minutes late.  We strive to give you quality time with our providers, and arriving late affects you and other patients whose appointments are after yours.  Also, if you no show three or more times for appointments you may be dismissed from the clinic at the providers discretion.     Again, thank you for choosing [facility].  Our hope is that these requests will decrease the amount of time that you wait before being seen by our physicians.       _____________________________________________________________  Should you have questions after your visit to [facility], please contact our office at [phone] between the hours of 8:00 a.m. and 4:30 p.m.  Voicemails left after 4:00 p.m. will not be returned until the following business day.  For prescription refill requests, have your pharmacy contact our office and allow 72 hours.    Cancer Center Support Programs:   > Cancer Support Group  2nd Tuesday of the month 1pm-2pm, Journey Room

## 2019-03-15 NOTE — Progress Notes (Signed)
0940 CBCD,CMET and Mag results reviewed with and pt seen by Dr. Delton Coombes and approved for chemo tx today per MD and will return tomorrow for further hydration per MD order.       Nicole Ibarra tolerated chemo tx well without complaints or incident. Port left accessed,saline locked and flushed for use tomorrow. VSS upon discharge. Pt discharged self ambulatory in satisfactory condition

## 2019-03-15 NOTE — Progress Notes (Signed)
[address], [place] [address]   CLINIC:  Medical Oncology/Hematology  PCP:  Cory Munch, PA-C [place] [address] [phone]   REASON FOR VISIT:  Follow-up for cholangiocarcinoma  CURRENT THERAPY: Cisplatin/gemcitabine days 1, 8 every 21 days.  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[place])    02/22/2019 -  Chemotherapy    The patient had palonosetron (ALOXI) injection 0.25 mg, 0.25 mg, Intravenous,  Once, 2 of 4 cycles Administration: 0.25 mg (02/22/2019), 0.25 mg (03/01/2019) pegfilgrastim-cbqv (UDENYCA) injection 6 mg, 6 mg, Subcutaneous, Once, 2 of 4 cycles Administration: 6 mg (03/02/2019) CISplatin (PLATINOL) 52 mg in sodium chloride 0.9 % 250 mL chemo infusion, 25 mg/m2 = 52 mg, Intravenous,  Once, 2 of 4 cycles Administration: 52 mg (02/22/2019), 52 mg (03/01/2019) gemcitabine (GEMZAR) 2,000 mg in sodium chloride 0.9 % 250 mL chemo infusion, 2,090 mg, Intravenous,  Once, 2 of 4 cycles Administration: 2,000 mg (02/22/2019), 2,000 mg (03/01/2019) fosaprepitant (EMEND) 150 mg, dexamethasone (DECADRON) 12 mg in sodium chloride 0.9 % 145 mL IVPB, , Intravenous,  Once, 2 of 4 cycles Administration:  (02/22/2019),  (03/01/2019)  for chemotherapy treatment.       CANCER STAGING: Cancer Staging No matching staging information was found for the patient.   INTERVAL HISTORY:  Nicole Ibarra 63 y.o. female returns for routine follow-up and consideration for next cycle of chemotherapy.  She states that she has been doing well since she was here last. She states that she has been trying to drink more water but it doesn't taste good so it is hard for her.  She denies any trouble with her hearing. She denies any peripheral neuropathy.  She reports that her taste has been off.  She states that foods are bland. She reports  that she urinates every hour or so.  She has had some constipation.  She has had some fatigue.  She said that her appetite comes and goes.    Overall, she tells me she has been feeling pretty well. Energy levels 25% appetite 25%.   Overall, she feels ready for next cycle of chemo today.     REVIEW OF SYSTEMS:  Review of Systems  Constitutional: Positive for fatigue.  Gastrointestinal: Positive for constipation.  Psychiatric/Behavioral: Positive for sleep disturbance.  All other systems reviewed and are negative.    PAST MEDICAL/SURGICAL HISTORY:  Past Medical History:  Diagnosis Date  . Anxiety   . Arthritis   . Depression   . Hypertension    Past Surgical History:  Procedure Laterality Date  . COLONOSCOPY N/A 10/23/2016   Procedure: COLONOSCOPY;  Surgeon: Danie Binder, MD;  Location: [facility];  Service: Endoscopy;  Laterality: N/A;  11:30 Am  . POLYPECTOMY  10/23/2016   Procedure: POLYPECTOMY;  Surgeon: Danie Binder, MD;  Location: [facility];  Service: Endoscopy;;  sigmoid colon polyp  . PORTACATH PLACEMENT Left 02/16/2019   Procedure: INSERTION PORT-A-CATH (attached catheter in left subclavian);  Surgeon: Virl Cagey, MD;  Location: [facility] ORS;  Service: General;  Laterality: Left;  . RT HIP SURGERY    . TOTAL HIP ARTHROPLASTY Left 04/07/2016   Procedure: LEFT TOTAL HIP ARTHROPLASTY ANTERIOR APPROACH;  Surgeon: Paralee Cancel, MD;  Location: [facility];  Service: Orthopedics;  Laterality: Left;  Unsuccessful for Spinal, went to General     SOCIAL  HISTORY:  Social History   Socioeconomic History  . Marital status: Single    Spouse name: Not on file  . Number of children: Not on file  . Years of education: Not on file  . Highest education level: Not on file  Occupational History  . Not on file  Social Needs  . Financial resource strain: Not hard at all  . Food insecurity:    Worry: Never true    Inability: Never true  . Transportation needs:    Medical:  No    Non-medical: No  Tobacco Use  . Smoking status: Current Every Day Smoker    Packs/day: 0.25    Years: 42.00    Pack years: 10.50  . Smokeless tobacco: Never Used  Substance and Sexual Activity  . Alcohol use: No  . Drug use: No  . Sexual activity: Not Currently  Lifestyle  . Physical activity:    Days per week: 0 days    Minutes per session: 0 min  . Stress: Only a little  Relationships  . Social connections:    Talks on phone: Three times a week    Gets together: Three times a week    Attends religious service: More than 4 times per year    Active member of club or organization: Yes    Attends meetings of clubs or organizations: More than 4 times per year    Relationship status: Never married  . Intimate partner violence:    Fear of current or ex partner: No    Emotionally abused: No    Physically abused: No    Forced sexual activity: No  Other Topics Concern  . Not on file  Social History Narrative  . Not on file    FAMILY HISTORY:  Family History  Problem Relation Age of Onset  . Hypertension Mother   . Cancer Father   . Hypertension Brother   . Cancer Brother   . Hypertension Sister   . Cancer Sister     CURRENT MEDICATIONS:  Outpatient Encounter Medications as of 03/15/2019  Medication Sig  . amLODIPine-Valsartan-HCTZ 10-320-25 MG TABS Take 1 tablet by mouth daily.  Marland Kitchen atorvastatin (LIPITOR) 20 MG tablet Take 20 mg by mouth 3 (three) times a week.   Marland Kitchen CISPLATIN IV Inject into the vein. Day 1, 8 every 21 days  . doxazosin (CARDURA) 2 MG tablet Take 2 mg by mouth every evening.   Marland Kitchen GEMCITABINE HCL IV Inject into the vein. Day 1, 8 every 21 days  . HYDROcodone-acetaminophen (NORCO) 5-325 MG tablet Take 1 tablet by mouth every 4 (four) hours as needed for moderate pain.  Marland Kitchen lidocaine-prilocaine (EMLA) cream Apply to skin over port a cath one hour prior to chemotherapy appointment  . naproxen sodium (ALEVE) 220 MG tablet Take 220 mg by mouth 2 (two) times  daily as needed.   . prochlorperazine (COMPAZINE) 10 MG tablet Take 1 tablet (10 mg total) by mouth every 6 (six) hours as needed (Nausea or vomiting).  . vitamin B-12 (CYANOCOBALAMIN) 50 MCG tablet Take 50 mcg by mouth daily.   Facility-Administered Encounter Medications as of 03/15/2019  Medication  . [COMPLETED] dextrose 5 % and 0.45% NaCl 1,000 mL with potassium chloride 20 mEq, magnesium sulfate 12 mEq infusion    ALLERGIES:  No Known Allergies   PHYSICAL EXAM:  ECOG Performance status: 1  I have reviewed her vitals.  Blood pressure is 137/58.  Pulse rate is 68.  Respirate is 18.  Temperature  98.3. Physical Exam Vitals signs reviewed.  Constitutional:      Appearance: Normal appearance.  Cardiovascular:     Rate and Rhythm: Normal rate and regular rhythm.     Heart sounds: Normal heart sounds.  Pulmonary:     Effort: Pulmonary effort is normal.     Breath sounds: Normal breath sounds.  Abdominal:     General: Bowel sounds are normal. There is no distension.     Palpations: Abdomen is soft. There is no mass.  Musculoskeletal:        General: No swelling.  Skin:    General: Skin is warm.  Neurological:     General: No focal deficit present.     Mental Status: She is alert and oriented to person, place, and time.  Psychiatric:        Mood and Affect: Mood normal.        Behavior: Behavior normal.      LABORATORY DATA:  I have reviewed the labs as listed.  CBC    Component Value Date/Time   WBC 11.3 (H) 03/15/2019 0759   RBC 3.10 (L) 03/15/2019 0759   HGB 9.7 (L) 03/15/2019 0759   HCT 29.1 (L) 03/15/2019 0759   PLT 279 03/15/2019 0759   MCV 93.9 03/15/2019 0759   MCH 31.3 03/15/2019 0759   MCHC 33.3 03/15/2019 0759   RDW 15.1 03/15/2019 0759   LYMPHSABS 2.9 03/15/2019 0759   MONOABS 1.0 03/15/2019 0759   EOSABS 0.2 03/15/2019 0759   BASOSABS 0.1 03/15/2019 0759   CMP Latest Ref Rng & Units 03/15/2019 03/01/2019 02/22/2019  Glucose 70 - 99 mg/dL 107(H) 103(H)  115(H)  BUN 8 - 23 mg/dL 22 18 14   Creatinine 0.44 - 1.00 mg/dL 1.21(H) 0.98 0.96  Sodium 135 - 145 mmol/L 138 138 137  Potassium 3.5 - 5.1 mmol/L 3.3(L) 3.4(L) 3.2(L)  Chloride 98 - 111 mmol/L 103 105 101  CO2 22 - 32 mmol/L 26 27 26   Calcium 8.9 - 10.3 mg/dL 9.1 9.2 9.7  Total Protein 6.5 - 8.1 g/dL 7.2 7.1 7.7  Total Bilirubin 0.3 - 1.2 mg/dL 0.4 0.6 0.7  Alkaline Phos 38 - 126 U/L 309(H) 291(H) 277(H)  AST 15 - 41 U/L 27 34 36  ALT 0 - 44 U/L 31 51(H) 40       DIAGNOSTIC IMAGING:  I have independently reviewed the scans and discussed with the patient.   I have reviewed Nicole Ibarra 's note and agree with the documentation.  I personally performed a face-to-face visit, made revisions and my assessment and plan is as follows.    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  Cholangiocarcinoma: - Presentation to the ER on 12/29/2018 with on and off abdominal pain, nausea, constipation for the last 1 year.  No weight loss.  - Colonoscopy on 10/23/2016, 6 mm tubular adenoma in the mid sigmoid colon, internal and external hemorrhoids. -Mammogram on 07/03/2015, BI-RADS 1.  Patient smokes 1 pack of cigarettes over 2 weeks since age 73. -CT scan of the abdomen and pelvis with contrast on 12/29/2018 showed a 1.6 x 3.3 cm infiltrative mass within the liver abutting the gallbladder fundus.  There is mild dilation of the biliary tree.  There is a 3.2 x 3.2 cm confluent mass/enlarged lymph node in the porta hepatis.  No other abnormalities were seen. - PET CT scan on 01/17/2019 shows hepatic hypermetabolism corresponding to gallbladder fundus with SUV of 9.1.  Focus of central  right hepatic lobe hypermetabolism likely corresponds to the site of intrahepatic ductal obstruction on the prior diagnostic CT with SUV of 8.3.  Hypermetabolism corresponding to the porta hepatis presumed nodal mass measuring 3.3 cm. - Core biopsy of the right liver mass consistent with  well-differentiated adenocarcinoma, positive for CK7 and CDX 2.  CK20 and TTF-1 are negative.  Differential diagnosis includes an upper GI/pancreaticobiliary primary. -Cycle 1 gemcitabine and cisplatin on 02/22/2019. -She had very mild nausea and constipation after cycle 1.  Some numbness in the fingers tips which lasted only a few seconds. -We will follow-up on the foundation 1 testing results. -I have reviewed her blood work today.  Creatinine is mildly elevated at 1.26. -She may proceed with cycle 2 today.  I will arrange for fluids today and tomorrow.  I have encouraged her to drink lots of fluids.  2.  Normocytic anemia: -This is chemotherapy-induced.  Hemoglobin today is.  We will closely monitor it.    Total time spent is 25 minutes with more than 15% of the time spent face-to-face discussing treatment plan, side effects and coordination of care.    Orders placed this encounter:  No orders of the defined types were placed in this encounter.     Derek Jack, MD [place] [phone]

## 2019-03-15 NOTE — Assessment & Plan Note (Addendum)
1.  Cholangiocarcinoma: - Presentation to the ER on 12/29/2018 with on and off abdominal pain, nausea, constipation for the last 1 year.  No weight loss.  - Colonoscopy on 10/23/2016, 6 mm tubular adenoma in the mid sigmoid colon, internal and external hemorrhoids. -Mammogram on 07/03/2015, BI-RADS 1.  Patient smokes 1 pack of cigarettes over 2 weeks since age 63. -CT scan of the abdomen and pelvis with contrast on 12/29/2018 showed a 1.6 x 3.3 cm infiltrative mass within the liver abutting the gallbladder fundus.  There is mild dilation of the biliary tree.  There is a 3.2 x 3.2 cm confluent mass/enlarged lymph node in the porta hepatis.  No other abnormalities were seen. - PET CT scan on 01/17/2019 shows hepatic hypermetabolism corresponding to gallbladder fundus with SUV of 9.1.  Focus of central right hepatic lobe hypermetabolism likely corresponds to the site of intrahepatic ductal obstruction on the prior diagnostic CT with SUV of 8.3.  Hypermetabolism corresponding to the porta hepatis presumed nodal mass measuring 3.3 cm. - Core biopsy of the right liver mass consistent with well-differentiated adenocarcinoma, positive for CK7 and CDX 2.  CK20 and TTF-1 are negative.  Differential diagnosis includes an upper GI/pancreaticobiliary primary. -Cycle 1 gemcitabine and cisplatin on 02/22/2019. -She had very mild nausea and constipation after cycle 1.  Some numbness in the fingers tips which lasted only a few seconds. -We will follow-up on the foundation 1 testing results. -I have reviewed her blood work today.  Creatinine is mildly elevated at 1.26. -She may proceed with cycle 2 today.  I will arrange for fluids today and tomorrow.  I have encouraged her to drink lots of fluids.  2.  Normocytic anemia: -This is chemotherapy-induced.  Hemoglobin today is.  We will closely monitor it.

## 2019-03-15 NOTE — Patient Instructions (Signed)
[facility] Discharge Instructions for Patients Receiving Chemotherapy   Beginning January 23rd 2017 lab work for the [facility] will be done in the  Main lab at [facility] on 1st floor. If you have a lab appointment with the [facility] please come in thru the  Main Entrance and check in at the main information desk   Today you received the following chemotherapy agents Gemzar and Cisplatin. Follow-up as scheduled. Call clinic for any questions or concerns  To help prevent nausea and vomiting after your treatment, we encourage you to take your nausea medication   If you develop nausea and vomiting, or diarrhea that is not controlled by your medication, call the clinic.  The clinic phone number is [phone]. Office hours are Monday-Friday 8:30am-5:00pm.  BELOW ARE SYMPTOMS THAT SHOULD BE REPORTED IMMEDIATELY:  *FEVER GREATER THAN 101.0 F  *CHILLS WITH OR WITHOUT FEVER  NAUSEA AND VOMITING THAT IS NOT CONTROLLED WITH YOUR NAUSEA MEDICATION  *UNUSUAL SHORTNESS OF BREATH  *UNUSUAL BRUISING OR BLEEDING  TENDERNESS IN MOUTH AND THROAT WITH OR WITHOUT PRESENCE OF ULCERS  *URINARY PROBLEMS  *BOWEL PROBLEMS  UNUSUAL RASH Items with * indicate a potential emergency and should be followed up as soon as possible. If you have an emergency after office hours please contact your primary care physician or go to the nearest emergency department.  Please call the clinic during office hours if you have any questions or concerns.   You may also contact the Patient Navigator at [phone] should you have any questions or need assistance in obtaining follow up care.      Resources For Cancer Patients and their Caregivers ? American Cancer Society: Can assist with transportation, wigs, general needs, runs Look Good Feel Better.        (380) 805-4721 ? Cancer Care: Provides financial assistance, online support groups, medication/co-pay assistance.   1-800-813-HOPE 646-096-8174) ? Magazine Assists [place] cancer patients and their families through emotional , educational and financial support.  (716)864-5429 ? [place] DSS Where to apply for food stamps, Medicaid and utility assistance. 5805331344 ? [employer]: Transportation to medical appointments. 234-854-5717 ? Social Security Administration: May apply for disability if have a Stage IV cancer. 830-173-2238 (386)464-5991 ? [employer], Disability and Transit Services: Assists with nutrition, care and transit needs. [phone]

## 2019-03-16 ENCOUNTER — Inpatient Hospital Stay (HOSPITAL_COMMUNITY): Payer: Medicare Other

## 2019-03-16 VITALS — BP 135/54 | HR 77 | Temp 98.5°F | Resp 18

## 2019-03-16 DIAGNOSIS — Z5111 Encounter for antineoplastic chemotherapy: Secondary | ICD-10-CM | POA: Diagnosis not present

## 2019-03-16 DIAGNOSIS — C787 Secondary malignant neoplasm of liver and intrahepatic bile duct: Principal | ICD-10-CM

## 2019-03-16 DIAGNOSIS — C221 Intrahepatic bile duct carcinoma: Secondary | ICD-10-CM

## 2019-03-16 MED ORDER — SODIUM CHLORIDE 0.9% FLUSH
10.0000 mL | Freq: Once | INTRAVENOUS | Status: AC | PRN
  Administered 2019-03-16: 10 mL

## 2019-03-16 MED ORDER — SODIUM CHLORIDE 0.9 % IV SOLN
Freq: Once | INTRAVENOUS | Status: AC
  Administered 2019-03-16: 11:00:00 via INTRAVENOUS
  Filled 2019-03-16: qty 1000

## 2019-03-16 MED ORDER — HEPARIN SOD (PORK) LOCK FLUSH 100 UNIT/ML IV SOLN
500.0000 [IU] | Freq: Once | INTRAVENOUS | Status: AC | PRN
  Administered 2019-03-16: 500 [IU]

## 2019-03-21 ENCOUNTER — Other Ambulatory Visit: Payer: Self-pay

## 2019-03-22 ENCOUNTER — Encounter (HOSPITAL_COMMUNITY): Payer: Self-pay

## 2019-03-22 ENCOUNTER — Encounter (HOSPITAL_COMMUNITY): Payer: Self-pay | Admitting: Hematology

## 2019-03-22 ENCOUNTER — Inpatient Hospital Stay (HOSPITAL_COMMUNITY): Payer: Medicare Other

## 2019-03-22 ENCOUNTER — Inpatient Hospital Stay (HOSPITAL_BASED_OUTPATIENT_CLINIC_OR_DEPARTMENT_OTHER): Payer: Medicare Other | Admitting: Hematology

## 2019-03-22 VITALS — BP 163/63 | HR 69 | Temp 98.1°F | Resp 16 | Wt 211.0 lb

## 2019-03-22 DIAGNOSIS — R5383 Other fatigue: Secondary | ICD-10-CM

## 2019-03-22 DIAGNOSIS — I10 Essential (primary) hypertension: Secondary | ICD-10-CM

## 2019-03-22 DIAGNOSIS — E875 Hyperkalemia: Secondary | ICD-10-CM

## 2019-03-22 DIAGNOSIS — Z5111 Encounter for antineoplastic chemotherapy: Secondary | ICD-10-CM | POA: Diagnosis not present

## 2019-03-22 DIAGNOSIS — D6481 Anemia due to antineoplastic chemotherapy: Secondary | ICD-10-CM | POA: Diagnosis not present

## 2019-03-22 DIAGNOSIS — C787 Secondary malignant neoplasm of liver and intrahepatic bile duct: Principal | ICD-10-CM

## 2019-03-22 DIAGNOSIS — G479 Sleep disorder, unspecified: Secondary | ICD-10-CM

## 2019-03-22 DIAGNOSIS — K5900 Constipation, unspecified: Secondary | ICD-10-CM

## 2019-03-22 DIAGNOSIS — R630 Anorexia: Secondary | ICD-10-CM | POA: Diagnosis not present

## 2019-03-22 DIAGNOSIS — E785 Hyperlipidemia, unspecified: Secondary | ICD-10-CM

## 2019-03-22 DIAGNOSIS — C221 Intrahepatic bile duct carcinoma: Secondary | ICD-10-CM | POA: Diagnosis not present

## 2019-03-22 DIAGNOSIS — Z79899 Other long term (current) drug therapy: Secondary | ICD-10-CM

## 2019-03-22 DIAGNOSIS — F17210 Nicotine dependence, cigarettes, uncomplicated: Secondary | ICD-10-CM

## 2019-03-22 LAB — COMPREHENSIVE METABOLIC PANEL
ALT: 25 U/L (ref 0–44)
AST: 23 U/L (ref 15–41)
Albumin: 3.7 g/dL (ref 3.5–5.0)
Alkaline Phosphatase: 310 U/L — ABNORMAL HIGH (ref 38–126)
Anion gap: 9 (ref 5–15)
BUN: 17 mg/dL (ref 8–23)
CO2: 27 mmol/L (ref 22–32)
Calcium: 9.3 mg/dL (ref 8.9–10.3)
Chloride: 102 mmol/L (ref 98–111)
Creatinine, Ser: 1.03 mg/dL — ABNORMAL HIGH (ref 0.44–1.00)
GFR calc Af Amer: >60 mL/min (ref >60)
GFR calc non Af Amer: 58 mL/min — ABNORMAL LOW (ref >60)
Glucose, Bld: 102 mg/dL — ABNORMAL HIGH (ref 70–99)
Potassium: 3.6 mmol/L (ref 3.5–5.1)
Sodium: 138 mmol/L (ref 135–145)
Total Bilirubin: 0.7 mg/dL (ref 0.3–1.2)
Total Protein: 7.2 g/dL (ref 6.5–8.1)

## 2019-03-22 LAB — CBC WITH DIFFERENTIAL/PLATELET
Abs Immature Granulocytes: 0.11 10*3/uL — ABNORMAL HIGH (ref 0.00–0.07)
Basophils Absolute: 0.1 10*3/uL (ref 0.0–0.1)
Basophils Relative: 1 %
Eosinophils Absolute: 0 10*3/uL (ref 0.0–0.5)
Eosinophils Relative: 1 %
HCT: 27 % — ABNORMAL LOW (ref 36.0–46.0)
Hemoglobin: 9 g/dL — ABNORMAL LOW (ref 12.0–15.0)
Immature Granulocytes: 2 %
Lymphocytes Relative: 38 %
Lymphs Abs: 2.8 10*3/uL (ref 0.7–4.0)
MCH: 31.4 pg (ref 26.0–34.0)
MCHC: 33.3 g/dL (ref 30.0–36.0)
MCV: 94.1 fL (ref 80.0–100.0)
Monocytes Absolute: 0.5 10*3/uL (ref 0.1–1.0)
Monocytes Relative: 7 %
Neutro Abs: 3.8 10*3/uL (ref 1.7–7.7)
Neutrophils Relative %: 51 %
Platelets: 348 10*3/uL (ref 150–400)
RBC: 2.87 MIL/uL — ABNORMAL LOW (ref 3.87–5.11)
RDW: 14.8 % (ref 11.5–15.5)
WBC: 7.3 10*3/uL (ref 4.0–10.5)
nRBC: 0 % (ref 0.0–0.2)

## 2019-03-22 LAB — MAGNESIUM: Magnesium: 2.1 mg/dL (ref 1.7–2.4)

## 2019-03-22 LAB — LACTATE DEHYDROGENASE: LDH: 162 U/L (ref 98–192)

## 2019-03-22 MED ORDER — SODIUM CHLORIDE 0.9% FLUSH
10.0000 mL | INTRAVENOUS | Status: DC | PRN
  Administered 2019-03-22: 10 mL
  Filled 2019-03-22: qty 10

## 2019-03-22 MED ORDER — POTASSIUM CHLORIDE 2 MEQ/ML IV SOLN
Freq: Once | INTRAVENOUS | Status: AC
  Administered 2019-03-22: 11:00:00 via INTRAVENOUS
  Filled 2019-03-22: qty 10

## 2019-03-22 MED ORDER — SODIUM CHLORIDE 0.9 % IV SOLN
2000.0000 mg | Freq: Once | INTRAVENOUS | Status: AC
  Administered 2019-03-22: 2000 mg via INTRAVENOUS
  Filled 2019-03-22: qty 52.6

## 2019-03-22 MED ORDER — SODIUM CHLORIDE 0.9 % IV SOLN
Freq: Once | INTRAVENOUS | Status: AC
  Administered 2019-03-22: 10:00:00 via INTRAVENOUS

## 2019-03-22 MED ORDER — HEPARIN SOD (PORK) LOCK FLUSH 100 UNIT/ML IV SOLN
500.0000 [IU] | Freq: Once | INTRAVENOUS | Status: AC | PRN
  Administered 2019-03-22: 500 [IU]

## 2019-03-22 MED ORDER — PALONOSETRON HCL INJECTION 0.25 MG/5ML
0.2500 mg | Freq: Once | INTRAVENOUS | Status: AC
  Administered 2019-03-22: 0.25 mg via INTRAVENOUS
  Filled 2019-03-22: qty 5

## 2019-03-22 MED ORDER — SODIUM CHLORIDE 0.9 % IV SOLN
Freq: Once | INTRAVENOUS | Status: AC
  Administered 2019-03-22: 13:00:00 via INTRAVENOUS
  Filled 2019-03-22: qty 5

## 2019-03-22 MED ORDER — SODIUM CHLORIDE 0.9 % IV SOLN: Freq: Once | INTRAVENOUS | Status: AC

## 2019-03-22 MED ORDER — SODIUM CHLORIDE 0.9 % IV SOLN
25.0000 mg/m2 | Freq: Once | INTRAVENOUS | Status: AC
  Administered 2019-03-22: 52 mg via INTRAVENOUS
  Filled 2019-03-22: qty 52

## 2019-03-22 NOTE — Patient Instructions (Signed)
[facility] Discharge Instructions for Patients Receiving Chemotherapy  Today you received the following chemotherapy agents   To help prevent nausea and vomiting after your treatment, we encourage you to take your nausea medication   If you develop nausea and vomiting that is not controlled by your nausea medication, call the clinic.   BELOW ARE SYMPTOMS THAT SHOULD BE REPORTED IMMEDIATELY:  *FEVER GREATER THAN 100.5 F  *CHILLS WITH OR WITHOUT FEVER  NAUSEA AND VOMITING THAT IS NOT CONTROLLED WITH YOUR NAUSEA MEDICATION  *UNUSUAL SHORTNESS OF BREATH  *UNUSUAL BRUISING OR BLEEDING  TENDERNESS IN MOUTH AND THROAT WITH OR WITHOUT PRESENCE OF ULCERS  *URINARY PROBLEMS  *BOWEL PROBLEMS  UNUSUAL RASH Items with * indicate a potential emergency and should be followed up as soon as possible.  Feel free to call the clinic should you have any questions or concerns. The clinic phone number is [phone].  Please show the CHEMO ALERT CARD at check-in to the Emergency Department and triage nurse.   

## 2019-03-22 NOTE — Patient Instructions (Signed)
[facility] at [facility] Discharge Instructions  You were seen today by Dr. Katragadda. He went over your recent lab results. He will see you back in 2 weeks for labs, treatment and follow up.   Thank you for choosing [facility] at [facility] to provide your oncology and hematology care.  To afford each patient quality time with our provider, please arrive at least 15 minutes before your scheduled appointment time.   If you have a lab appointment with the Cancer Center please come in thru the  Main Entrance and check in at the main information desk  You need to re-schedule your appointment should you arrive 10 or more minutes late.  We strive to give you quality time with our providers, and arriving late affects you and other patients whose appointments are after yours.  Also, if you no show three or more times for appointments you may be dismissed from the clinic at the providers discretion.     Again, thank you for choosing [facility].  Our hope is that these requests will decrease the amount of time that you wait before being seen by our physicians.       _____________________________________________________________  Should you have questions after your visit to [facility], please contact our office at [phone] between the hours of 8:00 a.m. and 4:30 p.m.  Voicemails left after 4:00 p.m. will not be returned until the following business day.  For prescription refill requests, have your pharmacy contact our office and allow 72 hours.    Cancer Center Support Programs:   > Cancer Support Group  2nd Tuesday of the month 1pm-2pm, Journey Room    

## 2019-03-22 NOTE — Progress Notes (Signed)
Pt seen by Dr. Delton Coombes today. Labs reviewed. VS and labs within parameters for treatment. Per ATravis LPN proceed with treatment.   Pt voided 337mls at this time.

## 2019-03-22 NOTE — Progress Notes (Signed)
Nicole Ibarra,  [id number]   CLINIC:  Medical Oncology/Hematology  PCP:  Cory Munch, [place] [phone]   REASON FOR VISIT:  Follow-up for adenocarcinoma of theliver andgallbladder mass with lymph node involvement  CURRENT THERAPY:Cisplatin/gemcitabine days 1, 8 every 21 days.   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[place])    02/22/2019 -  Chemotherapy    The patient had palonosetron (ALOXI) injection 0.25 mg, 0.25 mg, Intravenous,  Once, 2 of 4 cycles Administration: 0.25 mg (02/22/2019), 0.25 mg (03/01/2019), 0.25 mg (03/15/2019) pegfilgrastim-cbqv (UDENYCA) injection 6 mg, 6 mg, Subcutaneous, Once, 2 of 4 cycles Administration: 6 mg (03/02/2019) CISplatin (PLATINOL) 52 mg in sodium chloride 0.9 % 250 mL chemo infusion, 25 mg/m2 = 52 mg, Intravenous,  Once, 2 of 4 cycles Administration: 52 mg (02/22/2019), 52 mg (03/01/2019), 52 mg (03/15/2019) gemcitabine (GEMZAR) 2,000 mg in sodium chloride 0.9 % 250 mL chemo infusion, 2,090 mg, Intravenous,  Once, 2 of 4 cycles Administration: 2,000 mg (02/22/2019), 2,000 mg (03/01/2019), 2,000 mg (03/15/2019) fosaprepitant (EMEND) 150 mg, dexamethasone (DECADRON) 12 mg in sodium chloride 0.9 % 145 mL IVPB, , Intravenous,  Once, 2 of 4 cycles Administration:  (02/22/2019),  (03/01/2019),  (03/15/2019)  for chemotherapy treatment.       CANCER STAGING: Cancer Staging No matching staging information was found for the patient.   INTERVAL HISTORY:  Ms. Tates 63 y.o. female returns for routine follow-up and consideration for next cycle of chemotherapy. She is here today alone. She states that she has not felt great since her last treatment. She states that her appetite has not been great but she forces herself to eat. She states that she has noticed  nights sweats a few times. She states that nothing tastes right. Denies any nausea, vomiting, or diarrhea. Denies any new pains. Had not noticed any recent bleeding such as epistaxis, hematuria or hematochezia. Denies recent chest pain on exertion, shortness of breath on minimal exertion, pre-syncopal episodes, or palpitations. Denies any numbness or tingling in hands or feet. Denies any recent fevers, infections, or recent hospitalizations. Patient reports appetite at 50% and energy level at 75%.     REVIEW OF SYSTEMS:  Review of Systems  Constitutional: Positive for fatigue.     PAST MEDICAL/SURGICAL HISTORY:  Past Medical History:  Diagnosis Date  . Anxiety   . Arthritis   . Depression   . Hypertension    Past Surgical History:  Procedure Laterality Date  . COLONOSCOPY N/A 10/23/2016   Procedure: COLONOSCOPY;  Surgeon: Danie Binder, MD;  [facility];  Service: Endoscopy;  Laterality: N/A;  11:30 Am  . POLYPECTOMY  10/23/2016   Procedure: POLYPECTOMY;  Surgeon: Danie Binder, MD;  Location: [facility];  Service: Endoscopy;;  sigmoid colon polyp  . PORTACATH PLACEMENT Left 02/16/2019   Procedure: INSERTION PORT-A-CATH (attached catheter in left subclavian);  Surgeon: Virl Cagey, MD;  Location: [facility] ORS;  Service: General;  Laterality: Left;  . RT HIP SURGERY    . TOTAL HIP ARTHROPLASTY Left 04/07/2016   Procedure: LEFT TOTAL HIP ARTHROPLASTY ANTERIOR APPROACH;  Surgeon: Paralee Cancel, MD;  Location: [facility];  Service: Orthopedics;  Laterality: Left;  Unsuccessful for Spinal, went to General     SOCIAL HISTORY:  Social History   Socioeconomic History  .  Marital status: Single    Spouse name: Not on file  . Number of children: Not on file  . Years of education: Not on file  . Highest education level: Not on file  Occupational History  . Not on file  Social Needs  . Financial resource strain: Not hard at all  . Food insecurity:    Worry: Never true     Inability: Never true  . Transportation needs:    Medical: No    Non-medical: No  Tobacco Use  . Smoking status: Current Every Day Smoker    Packs/day: 0.25    Years: 42.00    Pack years: 10.50  . Smokeless tobacco: Never Used  Substance and Sexual Activity  . Alcohol use: No  . Drug use: No  . Sexual activity: Not Currently  Lifestyle  . Physical activity:    Days per week: 0 days    Minutes per session: 0 min  . Stress: Only a little  Relationships  . Social connections:    Talks on phone: Three times a week    Gets together: Three times a week    Attends religious service: More than 4 times per year    Active member of club or organization: Yes    Attends meetings of clubs or organizations: More than 4 times per year    Relationship status: Never married  . Intimate partner violence:    Fear of current or ex partner: No    Emotionally abused: No    Physically abused: No    Forced sexual activity: No  Other Topics Concern  . Not on file  Social History Narrative  . Not on file    FAMILY HISTORY:  Family History  Problem Relation Age of Onset  . Hypertension Mother   . Cancer Father   . Hypertension Brother   . Cancer Brother   . Hypertension Sister   . Cancer Sister     CURRENT MEDICATIONS:  Outpatient Encounter Medications as of 03/22/2019  Medication Sig  . amLODIPine-Valsartan-HCTZ 10-320-25 MG TABS Take 1 tablet by mouth daily.  Marland Kitchen atorvastatin (LIPITOR) 20 MG tablet Take 20 mg by mouth 3 (three) times a week.   Marland Kitchen CISPLATIN IV Inject into the vein. Day 1, 8 every 21 days  . doxazosin (CARDURA) 2 MG tablet Take 2 mg by mouth every evening.   Marland Kitchen GEMCITABINE HCL IV Inject into the vein. Day 1, 8 every 21 days  . HYDROcodone-acetaminophen (NORCO) 5-325 MG tablet Take 1 tablet by mouth every 4 (four) hours as needed for moderate pain.  Marland Kitchen lidocaine-prilocaine (EMLA) cream Apply to skin over port a cath one hour prior to chemotherapy appointment  . naproxen  sodium (ALEVE) 220 MG tablet Take 220 mg by mouth 2 (two) times daily as needed.   . prochlorperazine (COMPAZINE) 10 MG tablet Take 1 tablet (10 mg total) by mouth every 6 (six) hours as needed (Nausea or vomiting).  . vitamin B-12 (CYANOCOBALAMIN) 50 MCG tablet Take 50 mcg by mouth daily.   No facility-administered encounter medications on file as of 03/22/2019.     ALLERGIES:  No Known Allergies   PHYSICAL EXAM:  ECOG Performance status: 1  Blood pressure is 156/67 pulse rate is 64 respiratory is 18 temperature 98.1. Physical Exam Vitals signs reviewed.  Constitutional:      Appearance: Normal appearance.  Cardiovascular:     Rate and Rhythm: Normal rate and regular rhythm.     Heart sounds: Normal heart  sounds.  Pulmonary:     Effort: Pulmonary effort is normal.     Breath sounds: Normal breath sounds.  Abdominal:     General: Bowel sounds are normal. There is no distension.     Palpations: Abdomen is soft.  Musculoskeletal:        General: No swelling.  Skin:    General: Skin is warm.  Neurological:     General: No focal deficit present.     Mental Status: She is alert and oriented to person, place, and time.  Psychiatric:        Mood and Affect: Mood normal.        Behavior: Behavior normal.      LABORATORY DATA:  I have reviewed the labs as listed.  CBC    Component Value Date/Time   WBC 7.3 03/22/2019 0747   RBC 2.87 (L) 03/22/2019 0747   HGB 9.0 (L) 03/22/2019 0747   HCT 27.0 (L) 03/22/2019 0747   PLT 348 03/22/2019 0747   MCV 94.1 03/22/2019 0747   MCH 31.4 03/22/2019 0747   MCHC 33.3 03/22/2019 0747   RDW 14.8 03/22/2019 0747   LYMPHSABS 2.8 03/22/2019 0747   MONOABS 0.5 03/22/2019 0747   EOSABS 0.0 03/22/2019 0747   BASOSABS 0.1 03/22/2019 0747   CMP Latest Ref Rng & Units 03/22/2019 03/15/2019 03/01/2019  Glucose 70 - 99 mg/dL 102(H) 107(H) 103(H)  BUN 8 - 23 mg/dL 17 22 18   Creatinine 0.44 - 1.00 mg/dL 1.03(H) 1.21(H) 0.98  Sodium 135 - 145  mmol/L 138 138 138  Potassium 3.5 - 5.1 mmol/L 3.6 3.3(L) 3.4(L)  Chloride 98 - 111 mmol/L 102 103 105  CO2 22 - 32 mmol/L 27 26 27   Calcium 8.9 - 10.3 mg/dL 9.3 9.1 9.2  Total Protein 6.5 - 8.1 g/dL 7.2 7.2 7.1  Total Bilirubin 0.3 - 1.2 mg/dL 0.7 0.4 0.6  Alkaline Phos 38 - 126 U/L 310(H) 309(H) 291(H)  AST 15 - 41 U/L 23 27 34  ALT 0 - 44 U/L 25 31 51(H)       DIAGNOSTIC IMAGING:  I have independently reviewed the scans and discussed with the patient.   I have reviewed Venita Lick LPN's note and agree with the documentation.  I personally performed a face-to-face visit, made revisions and my assessment and plan is as follows.    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  Cholangiocarcinoma: - Presentation to the ER on 12/29/2018 with on and off abdominal pain, nausea, constipation for the last 1 year.  No weight loss.  - Colonoscopy on 10/23/2016, 6 mm tubular adenoma in the mid sigmoid colon, internal and external hemorrhoids. -Mammogram on 07/03/2015, BI-RADS 1.  Patient smokes 1 pack of cigarettes over 2 weeks since age 64. -CT scan of the abdomen and pelvis with contrast on 12/29/2018 showed a 1.6 x 3.3 cm infiltrative mass within the liver abutting the gallbladder fundus.  There is mild dilation of the biliary tree.  There is a 3.2 x 3.2 cm confluent mass/enlarged lymph node in the porta hepatis.  No other abnormalities were seen. - PET CT scan on 01/17/2019 shows hepatic hypermetabolism corresponding to gallbladder fundus with SUV of 9.1.  Focus of central right hepatic lobe hypermetabolism likely corresponds to the site of intrahepatic ductal obstruction on the prior diagnostic CT with SUV of 8.3.  Hypermetabolism corresponding to the porta hepatis presumed nodal mass measuring 3.3 cm. - Core biopsy of the right liver mass  consistent with well-differentiated adenocarcinoma, positive for CK7 and CDX 2.  CK20 and TTF-1 are negative.  Differential  diagnosis includes an upper GI/pancreaticobiliary primary. -Cycle 1 gemcitabine and cisplatin 2 weeks on 1 week off started on 02/22/2019. -Cycle 2 of gemcitabine and cisplatin started on 03/15/2019. - She felt slightly more weaker after cycle 2-day 1.  She did report occasional night sweats about once per week for the last 2 weeks.  Reports total loss of taste.  I have encouraged her to drink 1 can of boost per day.  Weight has been stable. -She had some constipation alternating with diarrhea.  We will closely monitor it. - She may proceed with cycle 2-day 8 treatment today.  She was encouraged to drink lots of fluids. -She will come back in 2 weeks for follow-up for cycle 3.  I plan to repeat scans after cycle 3 to evaluate response.  We will also follow-up on CEA levels.  2.  Normocytic anemia: -This is chemotherapy-induced.  Hemoglobin today is 9.  We will closely monitor it.     Total time spent is 25 minutes with more than 50% of the time spent face-to-face discussing chemotherapy side effects, response evaluation and coordination of care.    Orders placed this encounter:  No orders of the defined types were placed in this encounter.     Derek Jack, MD [place] [phone]

## 2019-03-22 NOTE — Assessment & Plan Note (Signed)
1.  Cholangiocarcinoma: - Presentation to the ER on 12/29/2018 with on and off abdominal pain, nausea, constipation for the last 1 year.  No weight loss.  - Colonoscopy on 10/23/2016, 6 mm tubular adenoma in the mid sigmoid colon, internal and external hemorrhoids. -Mammogram on 07/03/2015, BI-RADS 1.  Patient smokes 1 pack of cigarettes over 2 weeks since age 63. -CT scan of the abdomen and pelvis with contrast on 12/29/2018 showed a 1.6 x 3.3 cm infiltrative mass within the liver abutting the gallbladder fundus.  There is mild dilation of the biliary tree.  There is a 3.2 x 3.2 cm confluent mass/enlarged lymph node in the porta hepatis.  No other abnormalities were seen. - PET CT scan on 01/17/2019 shows hepatic hypermetabolism corresponding to gallbladder fundus with SUV of 9.1.  Focus of central right hepatic lobe hypermetabolism likely corresponds to the site of intrahepatic ductal obstruction on the prior diagnostic CT with SUV of 8.3.  Hypermetabolism corresponding to the porta hepatis presumed nodal mass measuring 3.3 cm. - Core biopsy of the right liver mass consistent with well-differentiated adenocarcinoma, positive for CK7 and CDX 2.  CK20 and TTF-1 are negative.  Differential diagnosis includes an upper GI/pancreaticobiliary primary. -Cycle 1 gemcitabine and cisplatin 2 weeks on 1 week off started on 02/22/2019. -Cycle 2 of gemcitabine and cisplatin started on 03/15/2019. - She felt slightly more weaker after cycle 2-day 1.  She did report occasional night sweats about once per week for the last 2 weeks.  Reports total loss of taste.  I have encouraged her to drink 1 can of boost per day.  Weight has been stable. -She had some constipation alternating with diarrhea.  We will closely monitor it. - She may proceed with cycle 2-day 8 treatment today.  She was encouraged to drink lots of fluids. -She will come back in 2 weeks for follow-up for cycle 3.  I plan to repeat scans after cycle 3 to evaluate  response.  We will also follow-up on CEA levels.  2.  Normocytic anemia: -This is chemotherapy-induced.  Hemoglobin today is 9.  We will closely monitor it.

## 2019-03-23 ENCOUNTER — Encounter (HOSPITAL_COMMUNITY): Payer: Self-pay

## 2019-03-23 ENCOUNTER — Other Ambulatory Visit: Payer: Self-pay

## 2019-03-23 ENCOUNTER — Inpatient Hospital Stay (HOSPITAL_COMMUNITY): Payer: Medicare Other

## 2019-03-23 VITALS — BP 153/60 | HR 67 | Temp 97.8°F | Resp 18 | Wt 212.4 lb

## 2019-03-23 DIAGNOSIS — Z5111 Encounter for antineoplastic chemotherapy: Secondary | ICD-10-CM | POA: Diagnosis not present

## 2019-03-23 DIAGNOSIS — C787 Secondary malignant neoplasm of liver and intrahepatic bile duct: Principal | ICD-10-CM

## 2019-03-23 DIAGNOSIS — C221 Intrahepatic bile duct carcinoma: Secondary | ICD-10-CM

## 2019-03-23 LAB — CEA: CEA: 33.8 ng/mL — ABNORMAL HIGH (ref 0.0–4.7)

## 2019-03-23 MED ORDER — SODIUM CHLORIDE 0.9% FLUSH
10.0000 mL | Freq: Once | INTRAVENOUS | Status: AC | PRN
  Administered 2019-03-23: 10 mL

## 2019-03-23 MED ORDER — SODIUM CHLORIDE 0.9 % IV SOLN
Freq: Once | INTRAVENOUS | Status: AC
  Administered 2019-03-23: 10:00:00 via INTRAVENOUS
  Filled 2019-03-23: qty 1000

## 2019-03-23 MED ORDER — LIDOCAINE VISCOUS HCL 2 % MT SOLN: OROMUCOSAL | 2 refills | Status: DC

## 2019-03-23 MED ORDER — LIDOCAINE VISCOUS HCL 2 % MT SOLN: OROMUCOSAL | 2 refills | Status: AC

## 2019-03-23 MED ORDER — HEPARIN SOD (PORK) LOCK FLUSH 100 UNIT/ML IV SOLN
500.0000 [IU] | Freq: Once | INTRAVENOUS | Status: AC | PRN
  Administered 2019-03-23: 500 [IU]

## 2019-03-23 NOTE — Progress Notes (Signed)
Nicole Ibarra tolerated hydration with potassium and magnesium well without complaints or incident.Pt discharged with portacath saline locked and flushed for use tomorrow VSS upon discharge. Pt discharged self ambulatory in satisfactory condition

## 2019-03-23 NOTE — Patient Instructions (Signed)
[place] at [facility] Discharge Instructions  Received hydration with potassium and Magnesium today. Follow-up as scheduled. Call clinic for any questions or concerns   Thank you for choosing [place] at [facility] to provide your oncology and hematology care.  To afford each patient quality time with our provider, please arrive at least 15 minutes before your scheduled appointment time.   If you have a lab appointment with the [facility] please come in thru the  Main Entrance and check in at the main information desk  You need to re-schedule your appointment should you arrive 10 or more minutes late.  We strive to give you quality time with our providers, and arriving late affects you and other patients whose appointments are after yours.  Also, if you no show three or more times for appointments you may be dismissed from the clinic at the providers discretion.     Again, thank you for choosing [facility].  Our hope is that these requests will decrease the amount of time that you wait before being seen by our physicians.       _____________________________________________________________  Should you have questions after your visit to [facility], please contact our office at [phone] between the hours of 8:00 a.m. and 4:30 p.m.  Voicemails left after 4:00 p.m. will not be returned until the following business day.  For prescription refill requests, have your pharmacy contact our office and allow 72 hours.    Cancer Center Support Programs:   > Cancer Support Group  2nd Tuesday of the month 1pm-2pm, Journey Room

## 2019-03-24 ENCOUNTER — Inpatient Hospital Stay (HOSPITAL_COMMUNITY): Payer: Medicare Other

## 2019-03-24 ENCOUNTER — Encounter (HOSPITAL_COMMUNITY): Payer: Self-pay

## 2019-03-24 VITALS — BP 153/65 | HR 69 | Temp 97.8°F | Resp 18

## 2019-03-24 DIAGNOSIS — C221 Intrahepatic bile duct carcinoma: Secondary | ICD-10-CM

## 2019-03-24 DIAGNOSIS — C787 Secondary malignant neoplasm of liver and intrahepatic bile duct: Principal | ICD-10-CM

## 2019-03-24 DIAGNOSIS — Z5111 Encounter for antineoplastic chemotherapy: Secondary | ICD-10-CM | POA: Diagnosis not present

## 2019-03-24 MED ORDER — PEGFILGRASTIM-CBQV 6 MG/0.6ML ~~LOC~~ SOSY
6.0000 mg | PREFILLED_SYRINGE | Freq: Once | SUBCUTANEOUS | Status: AC
  Administered 2019-03-24: 6 mg via SUBCUTANEOUS
  Filled 2019-03-24: qty 0.6

## 2019-03-24 MED ORDER — SODIUM CHLORIDE 0.9 % IV SOLN
Freq: Once | INTRAVENOUS | Status: AC
  Administered 2019-03-24: 09:00:00 via INTRAVENOUS
  Filled 2019-03-24: qty 1000

## 2019-03-24 MED ORDER — HEPARIN SOD (PORK) LOCK FLUSH 100 UNIT/ML IV SOLN
500.0000 [IU] | Freq: Once | INTRAVENOUS | Status: AC | PRN
  Administered 2019-03-24: 500 [IU]

## 2019-03-24 MED ORDER — SODIUM CHLORIDE 0.9% FLUSH
10.0000 mL | Freq: Once | INTRAVENOUS | Status: AC | PRN
  Administered 2019-03-24: 10 mL

## 2019-03-24 NOTE — Patient Instructions (Signed)
[place] at [facility] Discharge Instructions  Received hydration with magnesium and potassium today as well as Udenyca injection. Follow-up as scheduled. Call clinic for any questions or concerns   Thank you for choosing [place] at [facility] to provide your oncology and hematology care.  To afford each patient quality time with our provider, please arrive at least 15 minutes before your scheduled appointment time.   If you have a lab appointment with the [facility] please come in thru the  Main Entrance and check in at the main information desk  You need to re-schedule your appointment should you arrive 10 or more minutes late.  We strive to give you quality time with our providers, and arriving late affects you and other patients whose appointments are after yours.  Also, if you no show three or more times for appointments you may be dismissed from the clinic at the providers discretion.     Again, thank you for choosing [facility].  Our hope is that these requests will decrease the amount of time that you wait before being seen by our physicians.       _____________________________________________________________  Should you have questions after your visit to [facility], please contact our office at [phone] between the hours of 8:00 a.m. and 4:30 p.m.  Voicemails left after 4:00 p.m. will not be returned until the following business day.  For prescription refill requests, have your pharmacy contact our office and allow 72 hours.    Cancer Center Support Programs:   > Cancer Support Group  2nd Tuesday of the month 1pm-2pm, Journey Room

## 2019-03-24 NOTE — Progress Notes (Signed)
Nicole Ibarra tolerated hydration and Udenyca injection well without complaints or incident. VSS upon discharge. Pt discharged self ambulatory in satisfactory condition

## 2019-04-05 ENCOUNTER — Other Ambulatory Visit: Payer: Self-pay

## 2019-04-06 ENCOUNTER — Inpatient Hospital Stay (HOSPITAL_COMMUNITY): Payer: Medicare Other

## 2019-04-06 ENCOUNTER — Encounter (HOSPITAL_COMMUNITY): Payer: Self-pay | Admitting: Hematology

## 2019-04-06 ENCOUNTER — Inpatient Hospital Stay (HOSPITAL_BASED_OUTPATIENT_CLINIC_OR_DEPARTMENT_OTHER): Payer: Medicare Other | Admitting: Hematology

## 2019-04-06 VITALS — BP 151/66 | HR 80

## 2019-04-06 DIAGNOSIS — G893 Neoplasm related pain (acute) (chronic): Secondary | ICD-10-CM | POA: Diagnosis not present

## 2019-04-06 DIAGNOSIS — E875 Hyperkalemia: Secondary | ICD-10-CM

## 2019-04-06 DIAGNOSIS — G4700 Insomnia, unspecified: Secondary | ICD-10-CM

## 2019-04-06 DIAGNOSIS — C787 Secondary malignant neoplasm of liver and intrahepatic bile duct: Principal | ICD-10-CM

## 2019-04-06 DIAGNOSIS — C221 Intrahepatic bile duct carcinoma: Secondary | ICD-10-CM

## 2019-04-06 DIAGNOSIS — I10 Essential (primary) hypertension: Secondary | ICD-10-CM

## 2019-04-06 DIAGNOSIS — Z5111 Encounter for antineoplastic chemotherapy: Secondary | ICD-10-CM | POA: Diagnosis not present

## 2019-04-06 DIAGNOSIS — K5900 Constipation, unspecified: Secondary | ICD-10-CM

## 2019-04-06 DIAGNOSIS — Z79899 Other long term (current) drug therapy: Secondary | ICD-10-CM

## 2019-04-06 DIAGNOSIS — R630 Anorexia: Secondary | ICD-10-CM

## 2019-04-06 DIAGNOSIS — R5383 Other fatigue: Secondary | ICD-10-CM

## 2019-04-06 DIAGNOSIS — D6481 Anemia due to antineoplastic chemotherapy: Secondary | ICD-10-CM

## 2019-04-06 LAB — CBC WITH DIFFERENTIAL/PLATELET
Abs Immature Granulocytes: 0.11 10*3/uL — ABNORMAL HIGH (ref 0.00–0.07)
Basophils Absolute: 0.1 10*3/uL (ref 0.0–0.1)
Basophils Relative: 1 %
Eosinophils Absolute: 0.4 10*3/uL (ref 0.0–0.5)
Eosinophils Relative: 4 %
HCT: 27.3 % — ABNORMAL LOW (ref 36.0–46.0)
Hemoglobin: 9.1 g/dL — ABNORMAL LOW (ref 12.0–15.0)
Immature Granulocytes: 1 %
Lymphocytes Relative: 24 %
Lymphs Abs: 2.2 10*3/uL (ref 0.7–4.0)
MCH: 32.4 pg (ref 26.0–34.0)
MCHC: 33.3 g/dL (ref 30.0–36.0)
MCV: 97.2 fL (ref 80.0–100.0)
Monocytes Absolute: 0.8 10*3/uL (ref 0.1–1.0)
Monocytes Relative: 9 %
Neutro Abs: 5.4 10*3/uL (ref 1.7–7.7)
Neutrophils Relative %: 61 %
Platelets: 191 10*3/uL (ref 150–400)
RBC: 2.81 MIL/uL — ABNORMAL LOW (ref 3.87–5.11)
RDW: 18 % — ABNORMAL HIGH (ref 11.5–15.5)
WBC: 9 10*3/uL (ref 4.0–10.5)
nRBC: 0 % (ref 0.0–0.2)

## 2019-04-06 LAB — COMPREHENSIVE METABOLIC PANEL
ALT: 27 U/L (ref 0–44)
AST: 26 U/L (ref 15–41)
Albumin: 3.7 g/dL (ref 3.5–5.0)
Alkaline Phosphatase: 327 U/L — ABNORMAL HIGH (ref 38–126)
Anion gap: 10 (ref 5–15)
BUN: 13 mg/dL (ref 8–23)
CO2: 26 mmol/L (ref 22–32)
Calcium: 9.5 mg/dL (ref 8.9–10.3)
Chloride: 103 mmol/L (ref 98–111)
Creatinine, Ser: 1.09 mg/dL — ABNORMAL HIGH (ref 0.44–1.00)
GFR calc Af Amer: >60 mL/min (ref >60)
GFR calc non Af Amer: 54 mL/min — ABNORMAL LOW (ref >60)
Glucose, Bld: 103 mg/dL — ABNORMAL HIGH (ref 70–99)
Potassium: 3.9 mmol/L (ref 3.5–5.1)
Sodium: 139 mmol/L (ref 135–145)
Total Bilirubin: 0.4 mg/dL (ref 0.3–1.2)
Total Protein: 7.1 g/dL (ref 6.5–8.1)

## 2019-04-06 LAB — LACTATE DEHYDROGENASE: LDH: 162 U/L (ref 98–192)

## 2019-04-06 LAB — MAGNESIUM: Magnesium: 2.1 mg/dL (ref 1.7–2.4)

## 2019-04-06 MED ORDER — POTASSIUM CHLORIDE 2 MEQ/ML IV SOLN
Freq: Once | INTRAVENOUS | Status: AC
  Administered 2019-04-06: 09:00:00 via INTRAVENOUS
  Filled 2019-04-06: qty 10

## 2019-04-06 MED ORDER — HEPARIN SOD (PORK) LOCK FLUSH 100 UNIT/ML IV SOLN
500.0000 [IU] | Freq: Once | INTRAVENOUS | Status: AC | PRN
  Administered 2019-04-06: 500 [IU]

## 2019-04-06 MED ORDER — SODIUM CHLORIDE 0.9 % IV SOLN
2000.0000 mg | Freq: Once | INTRAVENOUS | Status: AC
  Administered 2019-04-06: 2000 mg via INTRAVENOUS
  Filled 2019-04-06: qty 52.6

## 2019-04-06 MED ORDER — SODIUM CHLORIDE 0.9 % IV SOLN
25.0000 mg/m2 | Freq: Once | INTRAVENOUS | Status: AC
  Administered 2019-04-06: 13:00:00 52 mg via INTRAVENOUS
  Filled 2019-04-06: qty 52

## 2019-04-06 MED ORDER — SODIUM CHLORIDE 0.9% FLUSH
10.0000 mL | INTRAVENOUS | Status: DC | PRN
  Administered 2019-04-06: 10 mL
  Filled 2019-04-06: qty 10

## 2019-04-06 MED ORDER — PALONOSETRON HCL INJECTION 0.25 MG/5ML
0.2500 mg | Freq: Once | INTRAVENOUS | Status: AC
  Administered 2019-04-06: 0.25 mg via INTRAVENOUS
  Filled 2019-04-06: qty 5

## 2019-04-06 MED ORDER — SODIUM CHLORIDE 0.9 % IV SOLN
Freq: Once | INTRAVENOUS | Status: AC
  Administered 2019-04-06: 09:00:00 via INTRAVENOUS

## 2019-04-06 MED ORDER — SODIUM CHLORIDE 0.9 % IV SOLN
Freq: Once | INTRAVENOUS | Status: AC
  Administered 2019-04-06: 11:00:00 via INTRAVENOUS
  Filled 2019-04-06: qty 5

## 2019-04-06 NOTE — Assessment & Plan Note (Addendum)
1.  Metastatic cholangiocarcinoma: -CT scan of the abdomen and pelvis with contrast on 12/29/2018 showed a 1.6 x 3.3 cm infiltrative mass within the liver abutting the gallbladder fundus.  There is mild dilation of the biliary tree.  There is a 3.2 x 3.2 cm confluent mass/enlarged lymph node in the porta hepatis.  No other abnormalities were seen. - PET CT scan on 01/17/2019 shows hepatic hypermetabolism corresponding to gallbladder fundus with SUV of 9.1.  Focus of central right hepatic lobe hypermetabolism likely corresponds to the site of intrahepatic ductal obstruction on the prior diagnostic CT with SUV of 8.3.  Hypermetabolism corresponding to the porta hepatis presumed nodal mass measuring 3.3 cm. - Core biopsy of the right liver mass consistent with well-differentiated adenocarcinoma, positive for CK7 and CDX 2.  CK20 and TTF-1 are negative.  Differential diagnosis includes an upper GI/pancreaticobiliary primary. -2 cycles of gemcitabine and cisplatin on 02/22/2019 and 03/15/2019.  - CEA level improved to 33.8 on 03/22/2019 from 44.2 on 02/22/2019. -She has tolerated her last cycle very well.  Abdominal pain has completely improved since start of treatment.  She had some fatigue.  She is eating well and maintaining her weight. -We reviewed her blood work today.  She will proceed with cycle 3-day 1 gemcitabine and cisplatin today. -I will see her back in 1 week for follow-up and day 8 of treatment.  I plan to repeat CT scans after today's treatment.  2.  Normocytic anemia: -This is chemotherapy-induced. -Hemoglobin today is 9.1.  No transfusion necessary.

## 2019-04-06 NOTE — Patient Instructions (Signed)
[facility] Discharge Instructions for Patients Receiving Chemotherapy   Beginning January 23rd 2017 lab work for the Cancer Center will be done in the  Main lab at [facility] on 1st floor. If you have a lab appointment with the Cancer Center please come in thru the  Main Entrance and check in at the main information desk   Today you received the following chemotherapy agents Gemzar and Cisplatin  To help prevent nausea and vomiting after your treatment, we encourage you to take your nausea medication    If you develop nausea and vomiting, or diarrhea that is not controlled by your medication, call the clinic.  The clinic phone number is [phone]. Office hours are Monday-Friday 8:30am-5:00pm.  BELOW ARE SYMPTOMS THAT SHOULD BE REPORTED IMMEDIATELY:  *FEVER GREATER THAN 101.0 F  *CHILLS WITH OR WITHOUT FEVER  NAUSEA AND VOMITING THAT IS NOT CONTROLLED WITH YOUR NAUSEA MEDICATION  *UNUSUAL SHORTNESS OF BREATH  *UNUSUAL BRUISING OR BLEEDING  TENDERNESS IN MOUTH AND THROAT WITH OR WITHOUT PRESENCE OF ULCERS  *URINARY PROBLEMS  *BOWEL PROBLEMS  UNUSUAL RASH Items with * indicate a potential emergency and should be followed up as soon as possible. If you have an emergency after office hours please contact your primary care physician or go to the nearest emergency department.  Please call the clinic during office hours if you have any questions or concerns.   You may also contact the Patient Navigator at [phone] should you have any questions or need assistance in obtaining follow up care.      Resources For Cancer Patients and their Caregivers ? American Cancer Society: Can assist with transportation, wigs, general needs, runs Look Good Feel Better.        1-888-227-6333 ? Cancer Care: Provides financial assistance, online support groups, medication/co-pay assistance.  1-800-813-HOPE (4673) ? [facility] Assists  [place] cancer patients and their families through emotional , educational and financial support.  336-427-4357 ? [place] DSS Where to apply for food stamps, Medicaid and utility assistance. 336-342-1394 ? [facility]: Transportation to medical appointments. 336-347-2287 ? Social Security Administration: May apply for disability if have a Stage IV cancer. 336-342-7796 1-800-772-1213 ? [place] Aging, Disability and Transit Services: Assists with nutrition, care and transit needs. 336-349-2343          

## 2019-04-06 NOTE — Patient Instructions (Signed)
[facility] at [facility] Discharge Instructions  You were seen today by Dr. Katragadda. He went over your recent lab results. He will see you back in 1 week for labs and follow up.   Thank you for choosing [facility] at [facility] to provide your oncology and hematology care.  To afford each patient quality time with our provider, please arrive at least 15 minutes before your scheduled appointment time.   If you have a lab appointment with the Cancer Center please come in thru the  Main Entrance and check in at the main information desk  You need to re-schedule your appointment should you arrive 10 or more minutes late.  We strive to give you quality time with our providers, and arriving late affects you and other patients whose appointments are after yours.  Also, if you no show three or more times for appointments you may be dismissed from the clinic at the providers discretion.     Again, thank you for choosing [facility].  Our hope is that these requests will decrease the amount of time that you wait before being seen by our physicians.       _____________________________________________________________  Should you have questions after your visit to [facility], please contact our office at [phone] between the hours of 8:00 a.m. and 4:30 p.m.  Voicemails left after 4:00 p.m. will not be returned until the following business day.  For prescription refill requests, have your pharmacy contact our office and allow 72 hours.    Cancer Center Support Programs:   > Cancer Support Group  2nd Tuesday of the month 1pm-2pm, Journey Room    

## 2019-04-06 NOTE — Progress Notes (Signed)
Nicole Ibarra, [place] [address]   CLINIC:  Medical Oncology/Hematology  PCP:  Cory Munch, [place] [address] [phone]   REASON FOR VISIT:  Follow-up for adenocarcinoma of theliver andgallbladder mass with lymph node involvement  CURRENT THERAPY:Cisplatin/gemcitabine days 1, 8 every 21 days.      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[place])    02/22/2019 -  Chemotherapy    The patient had palonosetron (ALOXI) injection 0.25 mg, 0.25 mg, Intravenous,  Once, 3 of 4 cycles Administration: 0.25 mg (02/22/2019), 0.25 mg (03/01/2019), 0.25 mg (03/15/2019), 0.25 mg (03/22/2019), 0.25 mg (04/06/2019) pegfilgrastim-cbqv (UDENYCA) injection 6 mg, 6 mg, Subcutaneous, Once, 3 of 4 cycles Administration: 6 mg (03/02/2019), 6 mg (03/24/2019) CISplatin (PLATINOL) 52 mg in sodium chloride 0.9 % 250 mL chemo infusion, 25 mg/m2 = 52 mg, Intravenous,  Once, 3 of 4 cycles Administration: 52 mg (02/22/2019), 52 mg (03/01/2019), 52 mg (03/15/2019), 52 mg (03/22/2019), 52 mg (04/06/2019) gemcitabine (GEMZAR) 2,000 mg in sodium chloride 0.9 % 250 mL chemo infusion, 2,090 mg, Intravenous,  Once, 3 of 4 cycles Administration: 2,000 mg (02/22/2019), 2,000 mg (03/01/2019), 2,000 mg (03/15/2019), 2,000 mg (03/22/2019), 2,000 mg (04/06/2019) fosaprepitant (EMEND) 150 mg, dexamethasone (DECADRON) 12 mg in sodium chloride 0.9 % 145 mL IVPB, , Intravenous,  Once, 3 of 4 cycles Administration:  (02/22/2019),  (03/01/2019),  (03/15/2019),  (03/22/2019),  (04/06/2019)  for chemotherapy treatment.       CANCER STAGING: Cancer Staging No matching staging information was found for the patient.   INTERVAL HISTORY:  Nicole Ibarra 63 y.o. female returns for routine follow-up and consideration for next cycle of chemotherapy. She is here today alone. She  states that her appetite is a little better. She states that her pain has gotten better since starting treatment. Denies any nausea, vomiting, or diarrhea. Denies any new pains. Had not noticed any recent bleeding such as epistaxis, hematuria or hematochezia. Denies recent chest pain on exertion, shortness of breath on minimal exertion, pre-syncopal episodes, or palpitations. Denies any numbness or tingling in hands or feet. Denies any recent fevers, infections, or recent hospitalizations. Patient reports appetite at 50% and energy level at 25%.    REVIEW OF SYSTEMS:  Review of Systems  Constitutional: Positive for fatigue.     PAST MEDICAL/SURGICAL HISTORY:  Past Medical History:  Diagnosis Date  . Anxiety   . Arthritis   . Depression   . Hypertension    Past Surgical History:  Procedure Laterality Date  . COLONOSCOPY N/A 10/23/2016   Procedure: COLONOSCOPY;  Surgeon: Danie Binder, MD;  Location: [facility];  Service: Endoscopy;  Laterality: N/A;  11:30 Am  . POLYPECTOMY  10/23/2016   Procedure: POLYPECTOMY;  Surgeon: Danie Binder, MD;  Location: [facility];  Service: Endoscopy;;  sigmoid colon polyp  . PORTACATH PLACEMENT Left 02/16/2019   Procedure: INSERTION PORT-A-CATH (attached catheter in left subclavian);  Surgeon: Virl Cagey, MD;  Location: [facility] ORS;  Service: General;  Laterality: Left;  . RT HIP SURGERY    . TOTAL HIP ARTHROPLASTY Left 04/07/2016   Procedure: LEFT TOTAL HIP ARTHROPLASTY ANTERIOR APPROACH;  Surgeon: Paralee Cancel, MD;  Location: [facility];  Service: Orthopedics;  Laterality: Left;  Unsuccessful for Spinal, went to General     SOCIAL HISTORY:  Social History   Socioeconomic  History  . Marital status: Single    Spouse name: Not on file  . Number of children: Not on file  . Years of education: Not on file  . Highest education level: Not on file  Occupational History  . Not on file  Social Needs  . Financial resource strain: Not hard at all   . Food insecurity:    Worry: Never true    Inability: Never true  . Transportation needs:    Medical: No    Non-medical: No  Tobacco Use  . Smoking status: Current Every Day Smoker    Packs/day: 0.25    Years: 42.00    Pack years: 10.50  . Smokeless tobacco: Never Used  Substance and Sexual Activity  . Alcohol use: No  . Drug use: No  . Sexual activity: Not Currently  Lifestyle  . Physical activity:    Days per week: 0 days    Minutes per session: 0 min  . Stress: Only a little  Relationships  . Social connections:    Talks on phone: Three times a week    Gets together: Three times a week    Attends religious service: More than 4 times per year    Active member of club or organization: Yes    Attends meetings of clubs or organizations: More than 4 times per year    Relationship status: Never married  . Intimate partner violence:    Fear of current or ex partner: No    Emotionally abused: No    Physically abused: No    Forced sexual activity: No  Other Topics Concern  . Not on file  Social History Narrative  . Not on file    FAMILY HISTORY:  Family History  Problem Relation Age of Onset  . Hypertension Mother   . Cancer Father   . Hypertension Brother   . Cancer Brother   . Hypertension Sister   . Cancer Sister     CURRENT MEDICATIONS:  Outpatient Encounter Medications as of 04/06/2019  Medication Sig  . amLODIPine-Valsartan-HCTZ 10-320-25 MG TABS Take 1 tablet by mouth daily.  Marland Kitchen atorvastatin (LIPITOR) 20 MG tablet Take 20 mg by mouth 3 (three) times a week.   Marland Kitchen CISPLATIN IV Inject into the vein. Day 1, 8 every 21 days  . doxazosin (CARDURA) 2 MG tablet Take 2 mg by mouth every evening.   Marland Kitchen GEMCITABINE HCL IV Inject into the vein. Day 1, 8 every 21 days  . HYDROcodone-acetaminophen (NORCO) 5-325 MG tablet Take 1 tablet by mouth every 4 (four) hours as needed for moderate pain.  Marland Kitchen lidocaine (XYLOCAINE) 2 % solution Swish and swallow 1 tablespoon four times a  day prn sore mouth  . lidocaine-prilocaine (EMLA) cream Apply to skin over port a cath one hour prior to chemotherapy appointment  . naproxen sodium (ALEVE) 220 MG tablet Take 220 mg by mouth 2 (two) times daily as needed.   . prochlorperazine (COMPAZINE) 10 MG tablet Take 1 tablet (10 mg total) by mouth every 6 (six) hours as needed (Nausea or vomiting).  . vitamin B-12 (CYANOCOBALAMIN) 50 MCG tablet Take 50 mcg by mouth daily.   No facility-administered encounter medications on file as of 04/06/2019.     ALLERGIES:  No Known Allergies   PHYSICAL EXAM:  ECOG Performance status: 1  Vitals:   04/06/19 0811  BP: (!) 158/73  Pulse: 62  Resp: 18  Temp: 97.9 F (36.6 C)  SpO2: 100%   Filed Weights  04/06/19 0811  Weight: 211 lb 3.2 oz (95.8 kg)    Physical Exam Vitals signs reviewed.  Constitutional:      Appearance: Normal appearance.  Cardiovascular:     Rate and Rhythm: Normal rate and regular rhythm.     Heart sounds: Normal heart sounds.  Pulmonary:     Effort: Pulmonary effort is normal.     Breath sounds: Normal breath sounds.  Abdominal:     General: There is no distension.     Palpations: Abdomen is soft. There is no mass.  Musculoskeletal:        General: No swelling.  Skin:    General: Skin is warm.  Neurological:     General: No focal deficit present.     Mental Status: She is alert and oriented to person, place, and time.  Psychiatric:        Mood and Affect: Mood normal.        Behavior: Behavior normal.      LABORATORY DATA:  I have reviewed the labs as listed.  CBC    Component Value Date/Time   WBC 9.0 04/06/2019 0810   RBC 2.81 (L) 04/06/2019 0810   HGB 9.1 (L) 04/06/2019 0810   HCT 27.3 (L) 04/06/2019 0810   PLT 191 04/06/2019 0810   MCV 97.2 04/06/2019 0810   MCH 32.4 04/06/2019 0810   MCHC 33.3 04/06/2019 0810   RDW 18.0 (H) 04/06/2019 0810   LYMPHSABS 2.2 04/06/2019 0810   MONOABS 0.8 04/06/2019 0810   EOSABS 0.4 04/06/2019  0810   BASOSABS 0.1 04/06/2019 0810   CMP Latest Ref Rng & Units 04/06/2019 03/22/2019 03/15/2019  Glucose 70 - 99 mg/dL 103(H) 102(H) 107(H)  BUN 8 - 23 mg/dL 13 17 22   Creatinine 0.44 - 1.00 mg/dL 1.09(H) 1.03(H) 1.21(H)  Sodium 135 - 145 mmol/L 139 138 138  Potassium 3.5 - 5.1 mmol/L 3.9 3.6 3.3(L)  Chloride 98 - 111 mmol/L 103 102 103  CO2 22 - 32 mmol/L 26 27 26   Calcium 8.9 - 10.3 mg/dL 9.5 9.3 9.1  Total Protein 6.5 - 8.1 g/dL 7.1 7.2 7.2  Total Bilirubin 0.3 - 1.2 mg/dL 0.4 0.7 0.4  Alkaline Phos 38 - 126 U/L 327(H) 310(H) 309(H)  AST 15 - 41 U/L 26 23 27   ALT 0 - 44 U/L 27 25 31        DIAGNOSTIC IMAGING:  I have independently reviewed the scans and discussed with the patient.   I have reviewed Venita Lick LPN's note and agree with the documentation.  I personally performed a face-to-face visit, made revisions and my assessment and plan is as follows.    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  Metastatic cholangiocarcinoma: -CT scan of the abdomen and pelvis with contrast on 12/29/2018 showed a 1.6 x 3.3 cm infiltrative mass within the liver abutting the gallbladder fundus.  There is mild dilation of the biliary tree.  There is a 3.2 x 3.2 cm confluent mass/enlarged lymph node in the porta hepatis.  No other abnormalities were seen. - PET CT scan on 01/17/2019 shows hepatic hypermetabolism corresponding to gallbladder fundus with SUV of 9.1.  Focus of central right hepatic lobe hypermetabolism likely corresponds to the site of intrahepatic ductal obstruction on the prior diagnostic CT with SUV of 8.3.  Hypermetabolism corresponding to the porta hepatis presumed nodal mass measuring 3.3 cm. - Core biopsy of the right liver mass consistent with well-differentiated adenocarcinoma, positive for CK7 and  CDX 2.  CK20 and TTF-1 are negative.  Differential diagnosis includes an upper GI/pancreaticobiliary primary. -2 cycles of gemcitabine and cisplatin  on 02/22/2019 and 03/15/2019.  - CEA level improved to 33.8 on 03/22/2019 from 44.2 on 02/22/2019. -She has tolerated her last cycle very well.  Abdominal pain has completely improved since start of treatment.  She had some fatigue.  She is eating well and maintaining her weight. -We reviewed her blood work today.  She will proceed with cycle 3-day 1 gemcitabine and cisplatin today. -I will see her back in 1 week for follow-up and day 8 of treatment.  I plan to repeat CT scans after today's treatment.  2.  Normocytic anemia: -This is chemotherapy-induced. -Hemoglobin today is 9.1.  No transfusion necessary.      Total time spent is 25 minutes with more than 50% of the time spent face-to-face discussing and reinforcing treatment plan, side effects and coordination of care.     Orders placed this encounter:  No orders of the defined types were placed in this encounter.     Derek Jack, MD [place] [phone]

## 2019-04-06 NOTE — Progress Notes (Signed)
Patient seen by Dr. Delton Coombes and lab work reviewed. Per MD, ok to proceed with Gemzar and Cisplatin today.  Nicole Ibarra tolerated treatment without incident or complaint. VSS. Port left accessed for use tomorrow. Discharged self ambulatory in satisfactory condition.

## 2019-04-07 ENCOUNTER — Inpatient Hospital Stay (HOSPITAL_COMMUNITY): Payer: Medicare Other | Attending: Hematology

## 2019-04-07 ENCOUNTER — Other Ambulatory Visit: Payer: Self-pay

## 2019-04-07 VITALS — BP 136/56 | HR 75 | Temp 97.5°F | Resp 18

## 2019-04-07 DIAGNOSIS — K829 Disease of gallbladder, unspecified: Secondary | ICD-10-CM | POA: Insufficient documentation

## 2019-04-07 DIAGNOSIS — K5903 Drug induced constipation: Secondary | ICD-10-CM | POA: Diagnosis not present

## 2019-04-07 DIAGNOSIS — C221 Intrahepatic bile duct carcinoma: Secondary | ICD-10-CM

## 2019-04-07 DIAGNOSIS — Z5189 Encounter for other specified aftercare: Secondary | ICD-10-CM | POA: Insufficient documentation

## 2019-04-07 DIAGNOSIS — D6481 Anemia due to antineoplastic chemotherapy: Secondary | ICD-10-CM | POA: Insufficient documentation

## 2019-04-07 DIAGNOSIS — G893 Neoplasm related pain (acute) (chronic): Secondary | ICD-10-CM | POA: Diagnosis not present

## 2019-04-07 DIAGNOSIS — Z5111 Encounter for antineoplastic chemotherapy: Secondary | ICD-10-CM | POA: Diagnosis present

## 2019-04-07 DIAGNOSIS — Z79899 Other long term (current) drug therapy: Secondary | ICD-10-CM | POA: Insufficient documentation

## 2019-04-07 DIAGNOSIS — C787 Secondary malignant neoplasm of liver and intrahepatic bile duct: Principal | ICD-10-CM

## 2019-04-07 DIAGNOSIS — D649 Anemia, unspecified: Secondary | ICD-10-CM | POA: Insufficient documentation

## 2019-04-07 DIAGNOSIS — I10 Essential (primary) hypertension: Secondary | ICD-10-CM | POA: Diagnosis not present

## 2019-04-07 DIAGNOSIS — R5383 Other fatigue: Secondary | ICD-10-CM | POA: Diagnosis not present

## 2019-04-07 DIAGNOSIS — E875 Hyperkalemia: Secondary | ICD-10-CM | POA: Insufficient documentation

## 2019-04-07 DIAGNOSIS — F17210 Nicotine dependence, cigarettes, uncomplicated: Secondary | ICD-10-CM | POA: Insufficient documentation

## 2019-04-07 MED ORDER — HEPARIN SOD (PORK) LOCK FLUSH 100 UNIT/ML IV SOLN
500.0000 [IU] | Freq: Once | INTRAVENOUS | Status: AC | PRN
  Administered 2019-04-07: 500 [IU]

## 2019-04-07 MED ORDER — SODIUM CHLORIDE 0.9 % IV SOLN
Freq: Once | INTRAVENOUS | Status: AC
  Administered 2019-04-07: 11:00:00 via INTRAVENOUS
  Filled 2019-04-07: qty 1000

## 2019-04-07 MED ORDER — SODIUM CHLORIDE 0.9% FLUSH
10.0000 mL | Freq: Once | INTRAVENOUS | Status: AC | PRN
  Administered 2019-04-07: 10 mL

## 2019-04-07 NOTE — Progress Notes (Signed)
Hydration fluids given per orders. Patient tolerated it well without problems. Vitals stable and discharged home from clinic ambulatory. Follow up as scheduled.  

## 2019-04-07 NOTE — Patient Instructions (Signed)
[facility] at [facility]  Discharge Instructions:   _______________________________________________________________  Thank you for choosing [facility] at [facility] to provide your oncology and hematology care.  To afford each patient quality time with our providers, please arrive at least 15 minutes before your scheduled appointment.  You need to re-schedule your appointment if you arrive 10 or more minutes late.  We strive to give you quality time with our providers, and arriving late affects you and other patients whose appointments are after yours.  Also, if you no show three or more times for appointments you may be dismissed from the clinic.  Again, thank you for choosing [facility] at [facility]. Our hope is that these requests will allow you access to exceptional care and in a timely manner. _______________________________________________________________  If you have questions after your visit, please contact our office at [phone] between the hours of 8:30 a.m. and 5:00 p.m. Voicemails left after 4:30 p.m. will not be returned until the following business day. _______________________________________________________________  For prescription refill requests, have your pharmacy contact our office. _______________________________________________________________  Recommendations made by the consultant and any test results will be sent to your referring physician. _______________________________________________________________ 

## 2019-04-13 ENCOUNTER — Inpatient Hospital Stay (HOSPITAL_COMMUNITY): Payer: Medicare Other

## 2019-04-13 ENCOUNTER — Encounter (HOSPITAL_COMMUNITY): Payer: Self-pay | Admitting: Hematology

## 2019-04-13 ENCOUNTER — Other Ambulatory Visit: Payer: Self-pay

## 2019-04-13 ENCOUNTER — Inpatient Hospital Stay (HOSPITAL_BASED_OUTPATIENT_CLINIC_OR_DEPARTMENT_OTHER): Payer: Medicare Other | Admitting: Hematology

## 2019-04-13 VITALS — BP 158/62

## 2019-04-13 DIAGNOSIS — C221 Intrahepatic bile duct carcinoma: Secondary | ICD-10-CM | POA: Diagnosis not present

## 2019-04-13 DIAGNOSIS — R5383 Other fatigue: Secondary | ICD-10-CM

## 2019-04-13 DIAGNOSIS — C787 Secondary malignant neoplasm of liver and intrahepatic bile duct: Principal | ICD-10-CM

## 2019-04-13 DIAGNOSIS — Z5189 Encounter for other specified aftercare: Secondary | ICD-10-CM | POA: Diagnosis not present

## 2019-04-13 DIAGNOSIS — E875 Hyperkalemia: Secondary | ICD-10-CM

## 2019-04-13 DIAGNOSIS — F17210 Nicotine dependence, cigarettes, uncomplicated: Secondary | ICD-10-CM

## 2019-04-13 DIAGNOSIS — K829 Disease of gallbladder, unspecified: Secondary | ICD-10-CM | POA: Diagnosis not present

## 2019-04-13 DIAGNOSIS — D649 Anemia, unspecified: Secondary | ICD-10-CM | POA: Diagnosis not present

## 2019-04-13 DIAGNOSIS — Z79899 Other long term (current) drug therapy: Secondary | ICD-10-CM

## 2019-04-13 DIAGNOSIS — I10 Essential (primary) hypertension: Secondary | ICD-10-CM

## 2019-04-13 LAB — CBC WITH DIFFERENTIAL/PLATELET
Abs Immature Granulocytes: 0.02 10*3/uL (ref 0.00–0.07)
Basophils Absolute: 0.1 10*3/uL (ref 0.0–0.1)
Basophils Relative: 2 %
Eosinophils Absolute: 0.1 10*3/uL (ref 0.0–0.5)
Eosinophils Relative: 3 %
HCT: 25.8 % — ABNORMAL LOW (ref 36.0–46.0)
Hemoglobin: 8.7 g/dL — ABNORMAL LOW (ref 12.0–15.0)
Immature Granulocytes: 1 %
Lymphocytes Relative: 47 %
Lymphs Abs: 1.9 10*3/uL (ref 0.7–4.0)
MCH: 33 pg (ref 26.0–34.0)
MCHC: 33.7 g/dL (ref 30.0–36.0)
MCV: 97.7 fL (ref 80.0–100.0)
Monocytes Absolute: 0.3 10*3/uL (ref 0.1–1.0)
Monocytes Relative: 7 %
Neutro Abs: 1.6 10*3/uL — ABNORMAL LOW (ref 1.7–7.7)
Neutrophils Relative %: 40 %
Platelets: 259 10*3/uL (ref 150–400)
RBC: 2.64 MIL/uL — ABNORMAL LOW (ref 3.87–5.11)
RDW: 17 % — ABNORMAL HIGH (ref 11.5–15.5)
WBC: 4 10*3/uL (ref 4.0–10.5)
nRBC: 0 % (ref 0.0–0.2)

## 2019-04-13 LAB — COMPREHENSIVE METABOLIC PANEL
ALT: 26 U/L (ref 0–44)
AST: 28 U/L (ref 15–41)
Albumin: 3.6 g/dL (ref 3.5–5.0)
Alkaline Phosphatase: 263 U/L — ABNORMAL HIGH (ref 38–126)
Anion gap: 11 (ref 5–15)
BUN: 13 mg/dL (ref 8–23)
CO2: 24 mmol/L (ref 22–32)
Calcium: 9.2 mg/dL (ref 8.9–10.3)
Chloride: 102 mmol/L (ref 98–111)
Creatinine, Ser: 0.96 mg/dL (ref 0.44–1.00)
GFR calc Af Amer: >60 mL/min (ref >60)
GFR calc non Af Amer: >60 mL/min (ref >60)
Glucose, Bld: 110 mg/dL — ABNORMAL HIGH (ref 70–99)
Potassium: 3.5 mmol/L (ref 3.5–5.1)
Sodium: 137 mmol/L (ref 135–145)
Total Bilirubin: 0.6 mg/dL (ref 0.3–1.2)
Total Protein: 6.9 g/dL (ref 6.5–8.1)

## 2019-04-13 LAB — LACTATE DEHYDROGENASE: LDH: 171 U/L (ref 98–192)

## 2019-04-13 LAB — MAGNESIUM: Magnesium: 1.9 mg/dL (ref 1.7–2.4)

## 2019-04-13 MED ORDER — SODIUM CHLORIDE 0.9 % IV SOLN
Freq: Once | INTRAVENOUS | Status: AC
  Administered 2019-04-13: 09:00:00 via INTRAVENOUS

## 2019-04-13 MED ORDER — SODIUM CHLORIDE 0.9% FLUSH
10.0000 mL | INTRAVENOUS | Status: DC | PRN
  Administered 2019-04-13: 10 mL
  Filled 2019-04-13: qty 10

## 2019-04-13 MED ORDER — PALONOSETRON HCL INJECTION 0.25 MG/5ML
0.2500 mg | Freq: Once | INTRAVENOUS | Status: AC
  Administered 2019-04-13: 0.25 mg via INTRAVENOUS
  Filled 2019-04-13: qty 5

## 2019-04-13 MED ORDER — HEPARIN SOD (PORK) LOCK FLUSH 100 UNIT/ML IV SOLN
500.0000 [IU] | Freq: Once | INTRAVENOUS | Status: AC | PRN
  Administered 2019-04-13: 500 [IU]

## 2019-04-13 MED ORDER — SODIUM CHLORIDE 0.9 % IV SOLN
Freq: Once | INTRAVENOUS | Status: AC
  Administered 2019-04-13: 11:00:00 via INTRAVENOUS
  Filled 2019-04-13: qty 5

## 2019-04-13 MED ORDER — SODIUM CHLORIDE 0.9 % IV SOLN
25.0000 mg/m2 | Freq: Once | INTRAVENOUS | Status: AC
  Administered 2019-04-13: 52 mg via INTRAVENOUS
  Filled 2019-04-13: qty 52

## 2019-04-13 MED ORDER — POTASSIUM CHLORIDE 2 MEQ/ML IV SOLN
Freq: Once | INTRAVENOUS | Status: AC
  Administered 2019-04-13: 09:00:00 via INTRAVENOUS
  Filled 2019-04-13: qty 10

## 2019-04-13 MED ORDER — SODIUM CHLORIDE 0.9 % IV SOLN
2000.0000 mg | Freq: Once | INTRAVENOUS | Status: AC
  Administered 2019-04-13: 2000 mg via INTRAVENOUS
  Filled 2019-04-13: qty 52.6

## 2019-04-13 NOTE — Patient Instructions (Signed)
[facility] at [facility] Discharge Instructions  You were seen today by Dr. Katragadda. He went over your recent lab results. He will see you back in 2 weeks for labs and follow up.   Thank you for choosing [facility] at [facility] to provide your oncology and hematology care.  To afford each patient quality time with our provider, please arrive at least 15 minutes before your scheduled appointment time.   If you have a lab appointment with the Cancer Center please come in thru the  Main Entrance and check in at the main information desk  You need to re-schedule your appointment should you arrive 10 or more minutes late.  We strive to give you quality time with our providers, and arriving late affects you and other patients whose appointments are after yours.  Also, if you no show three or more times for appointments you may be dismissed from the clinic at the providers discretion.     Again, thank you for choosing [facility].  Our hope is that these requests will decrease the amount of time that you wait before being seen by our physicians.       _____________________________________________________________  Should you have questions after your visit to [facility], please contact our office at [phone] between the hours of 8:00 a.m. and 4:30 p.m.  Voicemails left after 4:00 p.m. will not be returned until the following business day.  For prescription refill requests, have your pharmacy contact our office and allow 72 hours.    Cancer Center Support Programs:   > Cancer Support Group  2nd Tuesday of the month 1pm-2pm, Journey Room    

## 2019-04-13 NOTE — Patient Instructions (Signed)
[facility] Discharge Instructions for Patients Receiving Chemotherapy   Beginning January 23rd 2017 lab work for the Cancer Center will be done in the  Main lab at [facility] on 1st floor. If you have a lab appointment with the Cancer Center please come in thru the  Main Entrance and check in at the main information desk   Today you received the following chemotherapy agents Gemzar and Cisplatin  To help prevent nausea and vomiting after your treatment, we encourage you to take your nausea medication    If you develop nausea and vomiting, or diarrhea that is not controlled by your medication, call the clinic.  The clinic phone number is [phone]. Office hours are Monday-Friday 8:30am-5:00pm.  BELOW ARE SYMPTOMS THAT SHOULD BE REPORTED IMMEDIATELY:  *FEVER GREATER THAN 101.0 F  *CHILLS WITH OR WITHOUT FEVER  NAUSEA AND VOMITING THAT IS NOT CONTROLLED WITH YOUR NAUSEA MEDICATION  *UNUSUAL SHORTNESS OF BREATH  *UNUSUAL BRUISING OR BLEEDING  TENDERNESS IN MOUTH AND THROAT WITH OR WITHOUT PRESENCE OF ULCERS  *URINARY PROBLEMS  *BOWEL PROBLEMS  UNUSUAL RASH Items with * indicate a potential emergency and should be followed up as soon as possible. If you have an emergency after office hours please contact your primary care physician or go to the nearest emergency department.  Please call the clinic during office hours if you have any questions or concerns.   You may also contact the Patient Navigator at [phone] should you have any questions or need assistance in obtaining follow up care.      Resources For Cancer Patients and their Caregivers ? American Cancer Society: Can assist with transportation, wigs, general needs, runs Look Good Feel Better.        1-888-227-6333 ? Cancer Care: Provides financial assistance, online support groups, medication/co-pay assistance.  1-800-813-HOPE (4673) ? [facility] Assists  [place] cancer patients and their families through emotional , educational and financial support.  336-427-4357 ? [place] DSS Where to apply for food stamps, Medicaid and utility assistance. 336-342-1394 ? [facility]: Transportation to medical appointments. 336-347-2287 ? Social Security Administration: May apply for disability if have a Stage IV cancer. 336-342-7796 1-800-772-1213 ? [place] Aging, Disability and Transit Services: Assists with nutrition, care and transit needs. 336-349-2343          

## 2019-04-13 NOTE — Progress Notes (Signed)
[address] [place], [place] [address]   CLINIC:  Medical Oncology/Hematology  PCP:  Cory Munch, [place] [id number] [phone]   REASON FOR VISIT:  Follow-up for adenocarcinoma of theliver andgallbladder mass with lymph node involvement  CURRENT THERAPY:Cisplatin/gemcitabine days 1, 8 every 21 days.      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[place])    02/22/2019 -  Chemotherapy    The patient had palonosetron (ALOXI) injection 0.25 mg, 0.25 mg, Intravenous,  Once, 3 of 4 cycles Administration: 0.25 mg (02/22/2019), 0.25 mg (03/01/2019), 0.25 mg (03/15/2019), 0.25 mg (03/22/2019), 0.25 mg (04/06/2019) pegfilgrastim-cbqv (UDENYCA) injection 6 mg, 6 mg, Subcutaneous, Once, 3 of 4 cycles Administration: 6 mg (03/02/2019), 6 mg (03/24/2019) CISplatin (PLATINOL) 52 mg in sodium chloride 0.9 % 250 mL chemo infusion, 25 mg/m2 = 52 mg, Intravenous,  Once, 3 of 4 cycles Administration: 52 mg (02/22/2019), 52 mg (03/01/2019), 52 mg (03/15/2019), 52 mg (03/22/2019), 52 mg (04/06/2019) gemcitabine (GEMZAR) 2,000 mg in sodium chloride 0.9 % 250 mL chemo infusion, 2,090 mg, Intravenous,  Once, 3 of 4 cycles Administration: 2,000 mg (02/22/2019), 2,000 mg (03/01/2019), 2,000 mg (03/15/2019), 2,000 mg (03/22/2019), 2,000 mg (04/06/2019) fosaprepitant (EMEND) 150 mg, dexamethasone (DECADRON) 12 mg in sodium chloride 0.9 % 145 mL IVPB, , Intravenous,  Once, 3 of 4 cycles Administration:  (02/22/2019),  (03/01/2019),  (03/15/2019),  (03/22/2019),  (04/06/2019)  for chemotherapy treatment.       CANCER STAGING: Cancer Staging No matching staging information was found for the patient.   INTERVAL HISTORY:  Ms. Scherger 63 y.o. female returns for routine follow-up and next treatment.  She received cycle 3-day 1 on 04/06/2019.  She is here for  day 8 treatment.  She did not experience any GI side effects including nausea vomiting diarrhea or constipation after her last treatment.  She had mild fatigue which is stable.  Reported that her leg swellings have gotten better.  She reports energy at 50% and appetite at 50%.  Her abdominal pain has completely improved after starting of chemotherapy.  Appetite has been stable.    REVIEW OF SYSTEMS:  Review of Systems  Constitutional: Positive for fatigue.  Cardiovascular: Positive for leg swelling.     PAST MEDICAL/SURGICAL HISTORY:  Past Medical History:  Diagnosis Date  . Anxiety   . Arthritis   . Depression   . Hypertension    Past Surgical History:  Procedure Laterality Date  . COLONOSCOPY N/A 10/23/2016   Procedure: COLONOSCOPY;  Surgeon: Danie Binder, MD;  Location: [facility];  Service: Endoscopy;  Laterality: N/A;  11:30 Am  . POLYPECTOMY  10/23/2016   Procedure: POLYPECTOMY;  Surgeon: Danie Binder, MD;  Location: [facility];  Service: Endoscopy;;  sigmoid colon polyp  . PORTACATH PLACEMENT Left 02/16/2019   Procedure: INSERTION PORT-A-CATH (attached catheter in left subclavian);  Surgeon: Virl Cagey, MD;  Location: [facility]S;  Service: General;  Laterality: Left;  . RT HIP SURGERY    . TOTAL HIP ARTHROPLASTY Left 04/07/2016   Procedure: LEFT TOTAL HIP ARTHROPLASTY ANTERIOR APPROACH;  Surgeon: Paralee Cancel, MD;  Location: [facility];  Service: Orthopedics;  Laterality: Left;  Unsuccessful for Spinal, went to General     SOCIAL HISTORY:  Social History   Socioeconomic History  . Marital status: Single    Spouse  name: Not on file  . Number of children: Not on file  . Years of education: Not on file  . Highest education level: Not on file  Occupational History  . Not on file  Social Needs  . Financial resource strain: Not hard at all  . Food insecurity:    Worry: Never true    Inability: Never true  . Transportation needs:    Medical: No     Non-medical: No  Tobacco Use  . Smoking status: Current Every Day Smoker    Packs/day: 0.25    Years: 42.00    Pack years: 10.50  . Smokeless tobacco: Never Used  Substance and Sexual Activity  . Alcohol use: No  . Drug use: No  . Sexual activity: Not Currently  Lifestyle  . Physical activity:    Days per week: 0 days    Minutes per session: 0 min  . Stress: Only a little  Relationships  . Social connections:    Talks on phone: Three times a week    Gets together: Three times a week    Attends religious service: More than 4 times per year    Active member of club or organization: Yes    Attends meetings of clubs or organizations: More than 4 times per year    Relationship status: Never married  . Intimate partner violence:    Fear of current or ex partner: No    Emotionally abused: No    Physically abused: No    Forced sexual activity: No  Other Topics Concern  . Not on file  Social History Narrative  . Not on file    FAMILY HISTORY:  Family History  Problem Relation Age of Onset  . Hypertension Mother   . Cancer Father   . Hypertension Brother   . Cancer Brother   . Hypertension Sister   . Cancer Sister     CURRENT MEDICATIONS:  Outpatient Encounter Medications as of 04/13/2019  Medication Sig  . amLODIPine-Valsartan-HCTZ 10-320-25 MG TABS Take 1 tablet by mouth daily.  Marland Kitchen atorvastatin (LIPITOR) 20 MG tablet Take 20 mg by mouth 3 (three) times a week.   Marland Kitchen CISPLATIN IV Inject into the vein. Day 1, 8 every 21 days  . doxazosin (CARDURA) 2 MG tablet Take 2 mg by mouth every evening.   Marland Kitchen GEMCITABINE HCL IV Inject into the vein. Day 1, 8 every 21 days  . HYDROcodone-acetaminophen (NORCO) 5-325 MG tablet Take 1 tablet by mouth every 4 (four) hours as needed for moderate pain.  Marland Kitchen lidocaine (XYLOCAINE) 2 % solution Swish and swallow 1 tablespoon four times a day prn sore mouth  . lidocaine-prilocaine (EMLA) cream Apply to skin over port a cath one hour prior to  chemotherapy appointment  . naproxen sodium (ALEVE) 220 MG tablet Take 220 mg by mouth 2 (two) times daily as needed.   . prochlorperazine (COMPAZINE) 10 MG tablet Take 1 tablet (10 mg total) by mouth every 6 (six) hours as needed (Nausea or vomiting).  . vitamin B-12 (CYANOCOBALAMIN) 50 MCG tablet Take 50 mcg by mouth daily.   No facility-administered encounter medications on file as of 04/13/2019.     ALLERGIES:  No Known Allergies   PHYSICAL EXAM:  ECOG Performance status: 1  Vitals:   04/13/19 0808  BP: (!) 137/59  Pulse: 69  Resp: 18  Temp: 98.1 F (36.7 C)  SpO2: 100%   Filed Weights   04/13/19 0808  Weight: 211 lb (95.7 kg)  Physical Exam Vitals signs reviewed.  Constitutional:      Appearance: Normal appearance.  Cardiovascular:     Rate and Rhythm: Normal rate and regular rhythm.     Heart sounds: Normal heart sounds.  Pulmonary:     Effort: Pulmonary effort is normal.     Breath sounds: Normal breath sounds.  Abdominal:     General: There is no distension.     Palpations: Abdomen is soft. There is no mass.  Musculoskeletal:        General: No swelling.  Skin:    General: Skin is warm.  Neurological:     General: No focal deficit present.     Mental Status: She is alert and oriented to person, place, and time.  Psychiatric:        Mood and Affect: Mood normal.        Behavior: Behavior normal.      LABORATORY DATA:  I have reviewed the labs as listed.  CBC    Component Value Date/Time   WBC 4.0 04/13/2019 0805   RBC 2.64 (L) 04/13/2019 0805   HGB 8.7 (L) 04/13/2019 0805   HCT 25.8 (L) 04/13/2019 0805   PLT 259 04/13/2019 0805   MCV 97.7 04/13/2019 0805   MCH 33.0 04/13/2019 0805   MCHC 33.7 04/13/2019 0805   RDW 17.0 (H) 04/13/2019 0805   LYMPHSABS 1.9 04/13/2019 0805   MONOABS 0.3 04/13/2019 0805   EOSABS 0.1 04/13/2019 0805   BASOSABS 0.1 04/13/2019 0805   CMP Latest Ref Rng & Units 04/13/2019 04/06/2019 03/22/2019  Glucose 70 - 99  mg/dL 110(H) 103(H) 102(H)  BUN 8 - 23 mg/dL 13 13 17   Creatinine 0.44 - 1.00 mg/dL 0.96 1.09(H) 1.03(H)  Sodium 135 - 145 mmol/L 137 139 138  Potassium 3.5 - 5.1 mmol/L 3.5 3.9 3.6  Chloride 98 - 111 mmol/L 102 103 102  CO2 22 - 32 mmol/L 24 26 27   Calcium 8.9 - 10.3 mg/dL 9.2 9.5 9.3  Total Protein 6.5 - 8.1 g/dL 6.9 7.1 7.2  Total Bilirubin 0.3 - 1.2 mg/dL 0.6 0.4 0.7  Alkaline Phos 38 - 126 U/L 263(H) 327(H) 310(H)  AST 15 - 41 U/L 28 26 23   ALT 0 - 44 U/L 26 27 25        DIAGNOSTIC IMAGING:  I have independently reviewed the scans and discussed with the patient.   I have reviewed Venita Lick LPN's note and agree with the documentation.  I personally performed a face-to-face visit, made revisions and my assessment and plan is as follows.    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  Metastatic cholangiocarcinoma: -CT scan of the abdomen and pelvis with contrast on 12/29/2018 showed a 1.6 x 3.3 cm infiltrative mass within the liver abutting the gallbladder fundus.  There is mild dilation of the biliary tree.  There is a 3.2 x 3.2 cm confluent mass/enlarged lymph node in the porta hepatis.  No other abnormalities were seen. - PET CT scan on 01/17/2019 shows hepatic hypermetabolism corresponding to gallbladder fundus with SUV of 9.1.  Focus of central right hepatic lobe hypermetabolism likely corresponds to the site of intrahepatic ductal obstruction on the prior diagnostic CT with SUV of 8.3.  Hypermetabolism corresponding to the porta hepatis presumed nodal mass measuring 3.3 cm. - Core biopsy of the right liver mass consistent with well-differentiated adenocarcinoma, positive for CK7 and CDX 2.  CK20 and TTF-1 are negative.  Differential diagnosis includes an  upper GI/pancreaticobiliary primary. -2 cycles of gemcitabine and cisplatin on 02/22/2019 and 03/15/2019.  - CEA level improved to 33.8 on 03/22/2019 from 44.2 on 02/22/2019. -Cycle 3-day 1 of  gemcitabine and cisplatin on 04/06/2019.  Abdominal pain has completely resolved since start of therapy. -We reviewed her labs.  He may proceed with cycle 3-day 8 today.  We will plan to see her back in 2 weeks.  I plan to repeat CT CAP prior to next visit in 2 weeks.  2.  Normocytic anemia: -This is chemotherapy-induced. -Hemoglobin today is 8.7.  No transfusion needed.         Total time spent is 25 minutes with more than 50% of the time spent face-to-face discussing and reinforcing treatment plan, side effects and coordination of care.     Orders placed this encounter:  Orders Placed This Encounter  Procedures  . CT Abdomen Pelvis W Contrast  . CT Chest W Contrast  . CBC with Differential/Platelet  . Comprehensive metabolic panel  . Lactate dehydrogenase  . CEA  . Magnesium      Derek Jack, MD [place] [phone]

## 2019-04-13 NOTE — Progress Notes (Signed)
Lab work reviewed and patient seen by Dr. Delton Coombes who approved patient for treatment today.  Nicole Ibarra tolerated Gemzar and Cisplatin without incident or complaint. VSS. Port left accessed for use tomorrow. Discharged self ambulatory in satisfactory condition.

## 2019-04-13 NOTE — Assessment & Plan Note (Signed)
1.  Metastatic cholangiocarcinoma: -CT scan of the abdomen and pelvis with contrast on 12/29/2018 showed a 1.6 x 3.3 cm infiltrative mass within the liver abutting the gallbladder fundus.  There is mild dilation of the biliary tree.  There is a 3.2 x 3.2 cm confluent mass/enlarged lymph node in the porta hepatis.  No other abnormalities were seen. - PET CT scan on 01/17/2019 shows hepatic hypermetabolism corresponding to gallbladder fundus with SUV of 9.1.  Focus of central right hepatic lobe hypermetabolism likely corresponds to the site of intrahepatic ductal obstruction on the prior diagnostic CT with SUV of 8.3.  Hypermetabolism corresponding to the porta hepatis presumed nodal mass measuring 3.3 cm. - Core biopsy of the right liver mass consistent with well-differentiated adenocarcinoma, positive for CK7 and CDX 2.  CK20 and TTF-1 are negative.  Differential diagnosis includes an upper GI/pancreaticobiliary primary. -2 cycles of gemcitabine and cisplatin on 02/22/2019 and 03/15/2019.  - CEA level improved to 33.8 on 03/22/2019 from 44.2 on 02/22/2019. -Cycle 3-day 1 of gemcitabine and cisplatin on 04/06/2019.  Abdominal pain has completely resolved since start of therapy. -We reviewed her labs.  He may proceed with cycle 3-day 8 today.  We will plan to see her back in 2 weeks.  I plan to repeat CT CAP prior to next visit in 2 weeks.  2.  Normocytic anemia: -This is chemotherapy-induced. -Hemoglobin today is 8.7.  No transfusion needed.

## 2019-04-14 ENCOUNTER — Encounter (HOSPITAL_COMMUNITY): Payer: Self-pay

## 2019-04-14 ENCOUNTER — Inpatient Hospital Stay (HOSPITAL_COMMUNITY): Payer: Medicare Other

## 2019-04-14 VITALS — BP 137/55 | HR 63 | Temp 97.8°F | Resp 18

## 2019-04-14 DIAGNOSIS — C221 Intrahepatic bile duct carcinoma: Secondary | ICD-10-CM

## 2019-04-14 DIAGNOSIS — Z5189 Encounter for other specified aftercare: Secondary | ICD-10-CM | POA: Diagnosis not present

## 2019-04-14 MED ORDER — HEPARIN SOD (PORK) LOCK FLUSH 100 UNIT/ML IV SOLN
500.0000 [IU] | Freq: Once | INTRAVENOUS | Status: AC | PRN
  Administered 2019-04-14: 500 [IU]

## 2019-04-14 MED ORDER — SODIUM CHLORIDE 0.9% FLUSH
10.0000 mL | Freq: Once | INTRAVENOUS | Status: AC | PRN
  Administered 2019-04-14: 10 mL

## 2019-04-14 MED ORDER — SODIUM CHLORIDE 0.9 % IV SOLN
Freq: Once | INTRAVENOUS | Status: AC
  Administered 2019-04-14: 11:00:00 via INTRAVENOUS
  Filled 2019-04-14: qty 1000

## 2019-04-14 NOTE — Patient Instructions (Signed)
[facility] at [facility]  Discharge Instructions:   _______________________________________________________________  Thank you for choosing [facility] at [facility] to provide your oncology and hematology care.  To afford each patient quality time with our providers, please arrive at least 15 minutes before your scheduled appointment.  You need to re-schedule your appointment if you arrive 10 or more minutes late.  We strive to give you quality time with our providers, and arriving late affects you and other patients whose appointments are after yours.  Also, if you no show three or more times for appointments you may be dismissed from the clinic.  Again, thank you for choosing [facility] at [facility]. Our hope is that these requests will allow you access to exceptional care and in a timely manner. _______________________________________________________________  If you have questions after your visit, please contact our office at [phone] between the hours of 8:30 a.m. and 5:00 p.m. Voicemails left after 4:30 p.m. will not be returned until the following business day. _______________________________________________________________  For prescription refill requests, have your pharmacy contact our office. _______________________________________________________________  Recommendations made by the consultant and any test results will be sent to your referring physician. _______________________________________________________________ 

## 2019-04-14 NOTE — Progress Notes (Signed)
Patient tolerated hydration with no complaints voiced.  Port site clean and dry with no bruising or swelling noted at site.  Good blood return noted before and after hydration.  Band aid applied.  VSS with discharge and left ambulatory with no s/s of distress noted.   

## 2019-04-24 ENCOUNTER — Other Ambulatory Visit: Payer: Self-pay

## 2019-04-24 ENCOUNTER — Ambulatory Visit (HOSPITAL_COMMUNITY)
Admission: RE | Admit: 2019-04-24 | Discharge: 2019-04-24 | Disposition: A | Discharge status: Home / Self Care | Payer: Medicare Other | Source: Ambulatory Visit | Attending: Hematology | Admitting: Hematology

## 2019-04-24 DIAGNOSIS — C221 Intrahepatic bile duct carcinoma: Secondary | ICD-10-CM | POA: Diagnosis not present

## 2019-04-24 DIAGNOSIS — C787 Secondary malignant neoplasm of liver and intrahepatic bile duct: Secondary | ICD-10-CM | POA: Insufficient documentation

## 2019-04-24 MED ORDER — IOHEXOL 300 MG/ML  SOLN
100.0000 mL | Freq: Once | INTRAMUSCULAR | Status: AC | PRN
  Administered 2019-04-24: 100 mL via INTRAVENOUS

## 2019-04-27 ENCOUNTER — Encounter (HOSPITAL_COMMUNITY): Payer: Self-pay

## 2019-04-27 ENCOUNTER — Inpatient Hospital Stay (HOSPITAL_BASED_OUTPATIENT_CLINIC_OR_DEPARTMENT_OTHER): Payer: Medicare Other | Admitting: Hematology

## 2019-04-27 ENCOUNTER — Other Ambulatory Visit: Payer: Self-pay

## 2019-04-27 ENCOUNTER — Encounter (HOSPITAL_COMMUNITY): Payer: Self-pay | Admitting: Hematology

## 2019-04-27 ENCOUNTER — Inpatient Hospital Stay (HOSPITAL_COMMUNITY): Payer: Medicare Other

## 2019-04-27 VITALS — BP 184/70 | HR 68 | Temp 98.2°F | Resp 18

## 2019-04-27 DIAGNOSIS — G893 Neoplasm related pain (acute) (chronic): Secondary | ICD-10-CM

## 2019-04-27 DIAGNOSIS — D649 Anemia, unspecified: Secondary | ICD-10-CM

## 2019-04-27 DIAGNOSIS — R5383 Other fatigue: Secondary | ICD-10-CM

## 2019-04-27 DIAGNOSIS — E875 Hyperkalemia: Secondary | ICD-10-CM

## 2019-04-27 DIAGNOSIS — K5903 Drug induced constipation: Secondary | ICD-10-CM | POA: Diagnosis not present

## 2019-04-27 DIAGNOSIS — F17210 Nicotine dependence, cigarettes, uncomplicated: Secondary | ICD-10-CM

## 2019-04-27 DIAGNOSIS — C221 Intrahepatic bile duct carcinoma: Secondary | ICD-10-CM

## 2019-04-27 DIAGNOSIS — K829 Disease of gallbladder, unspecified: Secondary | ICD-10-CM | POA: Diagnosis not present

## 2019-04-27 DIAGNOSIS — C787 Secondary malignant neoplasm of liver and intrahepatic bile duct: Secondary | ICD-10-CM

## 2019-04-27 DIAGNOSIS — Z5189 Encounter for other specified aftercare: Secondary | ICD-10-CM | POA: Diagnosis not present

## 2019-04-27 DIAGNOSIS — I10 Essential (primary) hypertension: Secondary | ICD-10-CM

## 2019-04-27 DIAGNOSIS — Z79899 Other long term (current) drug therapy: Secondary | ICD-10-CM

## 2019-04-27 LAB — CBC WITH DIFFERENTIAL/PLATELET
Abs Immature Granulocytes: 0.01 10*3/uL (ref 0.00–0.07)
Basophils Absolute: 0 10*3/uL (ref 0.0–0.1)
Basophils Relative: 0 %
Eosinophils Absolute: 0.2 10*3/uL (ref 0.0–0.5)
Eosinophils Relative: 4 %
HCT: 26.4 % — ABNORMAL LOW (ref 36.0–46.0)
Hemoglobin: 8.7 g/dL — ABNORMAL LOW (ref 12.0–15.0)
Immature Granulocytes: 0 %
Lymphocytes Relative: 37 %
Lymphs Abs: 2 10*3/uL (ref 0.7–4.0)
MCH: 33.7 pg (ref 26.0–34.0)
MCHC: 33 g/dL (ref 30.0–36.0)
MCV: 102.3 fL — ABNORMAL HIGH (ref 80.0–100.0)
Monocytes Absolute: 0.4 10*3/uL (ref 0.1–1.0)
Monocytes Relative: 7 %
Neutro Abs: 2.8 10*3/uL (ref 1.7–7.7)
Neutrophils Relative %: 52 %
Platelets: 239 10*3/uL (ref 150–400)
RBC: 2.58 MIL/uL — ABNORMAL LOW (ref 3.87–5.11)
RDW: 17.8 % — ABNORMAL HIGH (ref 11.5–15.5)
WBC: 5.4 10*3/uL (ref 4.0–10.5)
nRBC: 0 % (ref 0.0–0.2)

## 2019-04-27 LAB — COMPREHENSIVE METABOLIC PANEL
ALT: 13 U/L (ref 0–44)
AST: 20 U/L (ref 15–41)
Albumin: 3.4 g/dL — ABNORMAL LOW (ref 3.5–5.0)
Alkaline Phosphatase: 244 U/L — ABNORMAL HIGH (ref 38–126)
Anion gap: 13 (ref 5–15)
BUN: 10 mg/dL (ref 8–23)
CO2: 22 mmol/L (ref 22–32)
Calcium: 9.1 mg/dL (ref 8.9–10.3)
Chloride: 104 mmol/L (ref 98–111)
Creatinine, Ser: 1.05 mg/dL — ABNORMAL HIGH (ref 0.44–1.00)
GFR calc Af Amer: >60 mL/min (ref >60)
GFR calc non Af Amer: 56 mL/min — ABNORMAL LOW (ref >60)
Glucose, Bld: 150 mg/dL — ABNORMAL HIGH (ref 70–99)
Potassium: 3.1 mmol/L — ABNORMAL LOW (ref 3.5–5.1)
Sodium: 139 mmol/L (ref 135–145)
Total Bilirubin: 0.7 mg/dL (ref 0.3–1.2)
Total Protein: 6.7 g/dL (ref 6.5–8.1)

## 2019-04-27 LAB — LACTATE DEHYDROGENASE: LDH: 165 U/L (ref 98–192)

## 2019-04-27 LAB — MAGNESIUM: Magnesium: 1.9 mg/dL (ref 1.7–2.4)

## 2019-04-27 MED ORDER — POTASSIUM CHLORIDE 2 MEQ/ML IV SOLN
Freq: Once | INTRAVENOUS | Status: AC
  Administered 2019-04-27: 09:00:00 via INTRAVENOUS
  Filled 2019-04-27: qty 10

## 2019-04-27 MED ORDER — SODIUM CHLORIDE 0.9 % IV SOLN
2000.0000 mg | Freq: Once | INTRAVENOUS | Status: AC
  Administered 2019-04-27: 2000 mg via INTRAVENOUS
  Filled 2019-04-27: qty 52.6

## 2019-04-27 MED ORDER — SODIUM CHLORIDE 0.9 % IV SOLN
Freq: Once | INTRAVENOUS | Status: AC
  Administered 2019-04-27: 11:00:00 via INTRAVENOUS
  Filled 2019-04-27: qty 5

## 2019-04-27 MED ORDER — PALONOSETRON HCL INJECTION 0.25 MG/5ML
0.2500 mg | Freq: Once | INTRAVENOUS | Status: AC
  Administered 2019-04-27: 0.25 mg via INTRAVENOUS
  Filled 2019-04-27: qty 5

## 2019-04-27 MED ORDER — SODIUM CHLORIDE 0.9% FLUSH
10.0000 mL | INTRAVENOUS | Status: DC | PRN
  Administered 2019-04-27: 10 mL
  Filled 2019-04-27: qty 10

## 2019-04-27 MED ORDER — HEPARIN SOD (PORK) LOCK FLUSH 100 UNIT/ML IV SOLN
500.0000 [IU] | Freq: Once | INTRAVENOUS | Status: AC | PRN
  Administered 2019-04-27: 500 [IU]

## 2019-04-27 MED ORDER — SODIUM CHLORIDE 0.9 % IV SOLN
Freq: Once | INTRAVENOUS | Status: AC
  Administered 2019-04-27: 08:00:00 via INTRAVENOUS

## 2019-04-27 MED ORDER — SODIUM CHLORIDE 0.9 % IV SOLN
25.0000 mg/m2 | Freq: Once | INTRAVENOUS | Status: AC
  Administered 2019-04-27: 52 mg via INTRAVENOUS
  Filled 2019-04-27: qty 52

## 2019-04-27 NOTE — Assessment & Plan Note (Addendum)
1.  Metastatic cholangiocarcinoma: - PET CT scan on 01/17/2019 shows hepatic hypermetabolism corresponding to gallbladder fundus with SUV of 9.1.  Focus of central right hepatic lobe hypermetabolism likely corresponds to the site of intrahepatic ductal obstruction on the prior diagnostic CT with SUV of 8.3.  Hypermetabolism corresponding to the porta hepatis presumed nodal mass measuring 3.3 cm. - Core biopsy of the right liver mass consistent with well-differentiated adenocarcinoma, positive for CK7 and CDX 2.  CK20 and TTF-1 are negative.  Differential diagnosis includes an upper GI/pancreaticobiliary primary. -3 cycles of gemcitabine and cisplatin from 02/22/2019 through 04/06/2019. - CEA improved to 33.8 on 03/22/2019 from 44.2 on 02/22/2019. -She missed her Neulasta injection after last cycle. - CT CAP on 04/24/2019 (compared to PET scan from 01/17/2019) did not show any significant change in the index lesions in the liver or enlarged porta hepatic lymph nodes. -She is tolerating chemotherapy very well.  Abdominal pain has improved after the start of chemotherapy. -I have reviewed her blood work.  She may proceed with cycle 4 without any dose modifications.  She will be seen back in 1 week for follow-up and day 8 of cycle 4.  2.  Normocytic anemia: -This is chemotherapy induced.

## 2019-04-27 NOTE — Progress Notes (Signed)
Patient to the treatment area for chemotherapy and oncology visit.  Patient denied neuropathy, ear ringing, nausea, or decreased appetite.  Patient has not taken her blood pressure medication for two days and oncologist aware.  No s/s of distress noted.    Patient seen by Dr. Delton Coombes with lab review and ok to treat today verbal order.    Patient tolerated chemotherapy with no complaints voiced.  Port site clean and dry with no bruising or swelling noted at site.  Good blood return noted before and after administration of chemotherapy.  Band aid applied.  Patient left ambulatory with VSS and no s/s of distress noted.

## 2019-04-27 NOTE — Patient Instructions (Addendum)
[facility] at [facility] Discharge Instructions  You were seen today by Dr. Katragadda. He went over your recent lab results. He will see you back in 1 week for labs, treatment and follow up.   Thank you for choosing [facility] at [facility] to provide your oncology and hematology care.  To afford each patient quality time with our provider, please arrive at least 15 minutes before your scheduled appointment time.   If you have a lab appointment with the Cancer Center please come in thru the  Main Entrance and check in at the main information desk  You need to re-schedule your appointment should you arrive 10 or more minutes late.  We strive to give you quality time with our providers, and arriving late affects you and other patients whose appointments are after yours.  Also, if you no show three or more times for appointments you may be dismissed from the clinic at the providers discretion.     Again, thank you for choosing [facility].  Our hope is that these requests will decrease the amount of time that you wait before being seen by our physicians.       _____________________________________________________________  Should you have questions after your visit to [facility], please contact our office at [phone] between the hours of 8:00 a.m. and 4:30 p.m.  Voicemails left after 4:00 p.m. will not be returned until the following business day.  For prescription refill requests, have your pharmacy contact our office and allow 72 hours.    Cancer Center Support Programs:   > Cancer Support Group  2nd Tuesday of the month 1pm-2pm, Journey Room    

## 2019-04-27 NOTE — Progress Notes (Signed)
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[place])    02/22/2019 -  Chemotherapy    The patient had palonosetron (ALOXI) injection 0.25 mg, 0.25 mg, Intravenous,  Once, 4 of 6 cycles Administration: 0.25 mg (02/22/2019), 0.25 mg (03/01/2019), 0.25 mg (03/15/2019), 0.25 mg (03/22/2019), 0.25 mg (04/06/2019), 0.25 mg (04/13/2019), 0.25 mg (04/27/2019) pegfilgrastim-cbqv (UDENYCA) injection 6 mg, 6 mg, Subcutaneous, Once, 4 of 6 cycles Administration: 6 mg (03/02/2019), 6 mg (03/24/2019) CISplatin (PLATINOL) 52 mg in sodium chloride 0.9 % 250 mL chemo infusion, 25 mg/m2 = 52 mg, Intravenous,  Once, 4 of 6 cycles Administration: 52 mg (02/22/2019), 52 mg (03/01/2019), 52 mg (03/15/2019), 52 mg (03/22/2019), 52 mg (04/06/2019), 52 mg (04/13/2019), 52 mg (04/27/2019) gemcitabine (GEMZAR) 2,000 mg in sodium chloride 0.9 % 250 mL chemo infusion, 2,090 mg, Intravenous,  Once, 4 of 6 cycles Administration: 2,000 mg (02/22/2019), 2,000 mg (03/01/2019), 2,000 mg (03/15/2019), 2,000 mg (03/22/2019), 2,000 mg (04/06/2019), 2,000 mg (04/13/2019), 2,000 mg (04/27/2019) fosaprepitant (EMEND) 150 mg, dexamethasone (DECADRON) 12 mg in sodium chloride 0.9 % 145 mL IVPB, , Intravenous,  Once, 4 of 6 cycles Administration:  (02/22/2019),  (03/01/2019),  (03/15/2019),  (03/22/2019),  (04/06/2019),  (04/13/2019),  (04/27/2019)  for chemotherapy treatment.       CANCER STAGING: Cancer Staging No matching staging information was found for the patient.   INTERVAL HISTORY:  Ms. Ragin 63 y.o. female returns for routine follow-up and  consideration for next cycle of chemotherapy. She is here today alone. She states that she has done well since her last visit. She states that she experienced a little constipation with her last treatment. She states that she lounges around most days at home.Denies any nausea, vomiting, or diarrhea. Denies any new pains. Had not noticed any recent bleeding such as epistaxis, hematuria or hematochezia. Denies recent chest pain on exertion, shortness of breath on minimal exertion, pre-syncopal episodes, or palpitations. Denies any numbness or tingling in hands or feet. Denies any recent fevers, infections, or recent hospitalizations. Patient reports appetite at 75% and energy level at 75%.      REVIEW OF SYSTEMS:  Review of Systems  Gastrointestinal: Positive for constipation.     PAST MEDICAL/SURGICAL HISTORY:  Past Medical History:  Diagnosis Date  . Anxiety   . Arthritis   . Depression   . Hypertension    Past Surgical History:  Procedure Laterality Date  . COLONOSCOPY N/A 10/23/2016   Procedure: COLONOSCOPY;  Surgeon: Danie Binder, MD;  Location: [facility];  Service: Endoscopy;  Laterality: N/A;  11:30 Am  . POLYPECTOMY  10/23/2016   Procedure: POLYPECTOMY;  Surgeon: Danie Binder, MD;  Location: [facility];  Service: Endoscopy;;  sigmoid colon polyp  . PORTACATH PLACEMENT Left 02/16/2019   Procedure: INSERTION PORT-A-CATH (attached catheter in left subclavian);  Surgeon: Virl Cagey, MD;  Location: [facility]S;  Service: General;  Laterality: Left;  . RT HIP SURGERY    . TOTAL HIP ARTHROPLASTY Left 04/07/2016   Procedure: LEFT TOTAL HIP ARTHROPLASTY ANTERIOR APPROACH;  Surgeon: Paralee Cancel, MD;  Location: [facility];  Service: Orthopedics;  Laterality: Left;  Unsuccessful for Spinal, went to General     SOCIAL HISTORY:  Social History   Socioeconomic History  . Marital status: Single    Spouse name: Not on file  . Number of children: Not on file  . Years of education:  Not on file  . Highest education level: Not on file  Occupational History  . Not on file  Social Needs  . Financial resource strain: Not hard at all  . Food insecurity:    Worry: Never true    Inability: Never true  . Transportation needs:    Medical: No    Non-medical: No  Tobacco Use  . Smoking status: Current Every Day Smoker    Packs/day: 0.25    Years: 42.00    Pack years: 10.50  . Smokeless tobacco: Never Used  Substance and Sexual Activity  . Alcohol use: No  . Drug use: No  . Sexual activity: Not Currently  Lifestyle  . Physical activity:    Days per week: 0 days    Minutes per session: 0 min  . Stress: Only a little  Relationships  . Social connections:    Talks on phone: Three times a week    Gets together: Three times a week    Attends religious service: More than 4 times per year    Active member of club or organization: Yes    Attends meetings of clubs or organizations: More than 4 times per year    Relationship status: Never married  . Intimate partner violence:    Fear of current or ex partner: No    Emotionally abused: No    Physically abused: No    Forced sexual activity: No  Other Topics Concern  . Not on file  Social History Narrative  . Not on file    FAMILY HISTORY:  Family History  Problem Relation Age of Onset  . Hypertension Mother   . Cancer Father   . Hypertension Brother   . Cancer Brother   . Hypertension Sister   . Cancer Sister     CURRENT MEDICATIONS:  Outpatient Encounter Medications as of 04/27/2019  Medication Sig  . amLODIPine-Valsartan-HCTZ 10-320-25 MG TABS Take 1 tablet by mouth daily.  Marland Kitchen atorvastatin (LIPITOR) 20 MG tablet Take 20 mg by mouth 3 (three) times a week.   Marland Kitchen CISPLATIN IV Inject into the vein. Day 1, 8 every 21 days  . doxazosin (CARDURA) 2 MG tablet Take 2 mg by mouth every evening.   Marland Kitchen GEMCITABINE HCL IV Inject into the vein. Day 1, 8 every 21 days  . HYDROcodone-acetaminophen (NORCO) 5-325 MG tablet  Take 1 tablet by mouth every 4 (four) hours as needed for moderate pain.  Marland Kitchen lidocaine (XYLOCAINE) 2 % solution Swish and swallow 1 tablespoon four times a day prn sore mouth  . lidocaine-prilocaine (EMLA) cream Apply to skin over port a cath one hour prior to chemotherapy appointment  . naproxen sodium (ALEVE) 220 MG tablet Take 220 mg by mouth 2 (two) times daily as needed.   . prochlorperazine (COMPAZINE) 10 MG tablet Take 1 tablet (10 mg total) by mouth every 6 (six) hours as needed (Nausea or vomiting).  . vitamin B-12 (CYANOCOBALAMIN) 50 MCG tablet Take 50 mcg by mouth daily.   Facility-Administered Encounter Medications as of 04/27/2019  Medication  . [COMPLETED] 0.9 %  sodium chloride infusion  . [COMPLETED] dextrose 5 % and 0.45% NaCl 1,000 mL with potassium chloride 20  mEq, magnesium sulfate 12 mEq infusion  . [COMPLETED] heparin lock flush 100 unit/mL  . sodium chloride flush (NS) 0.9 % injection 10 mL    ALLERGIES:  No Known Allergies   PHYSICAL EXAM:  ECOG Performance status: 1  Vitals:   04/27/19 0801  BP: (!) 183/68  Pulse: 70  Resp: 18  Temp: 98.3 F (36.8 C)  SpO2: 100%   Filed Weights   04/27/19 0801  Weight: 210 lb 9.6 oz (95.5 kg)    Physical Exam Vitals signs reviewed.  Constitutional:      Appearance: Normal appearance.  Cardiovascular:     Rate and Rhythm: Normal rate and regular rhythm.     Heart sounds: Normal heart sounds.  Pulmonary:     Effort: Pulmonary effort is normal.     Breath sounds: Normal breath sounds.  Abdominal:     General: There is no distension.     Palpations: Abdomen is soft. There is no mass.  Musculoskeletal:        General: No swelling.  Skin:    General: Skin is warm.  Neurological:     General: No focal deficit present.     Mental Status: She is alert and oriented to person, place, and time.  Psychiatric:        Mood and Affect: Mood normal.        Behavior: Behavior normal.      LABORATORY DATA:  I have  reviewed the labs as listed.  CBC    Component Value Date/Time   WBC 5.4 04/27/2019 0818   RBC 2.58 (L) 04/27/2019 0818   HGB 8.7 (L) 04/27/2019 0818   HCT 26.4 (L) 04/27/2019 0818   PLT 239 04/27/2019 0818   MCV 102.3 (H) 04/27/2019 0818   MCH 33.7 04/27/2019 0818   MCHC 33.0 04/27/2019 0818   RDW 17.8 (H) 04/27/2019 0818   LYMPHSABS 2.0 04/27/2019 0818   MONOABS 0.4 04/27/2019 0818   EOSABS 0.2 04/27/2019 0818   BASOSABS 0.0 04/27/2019 0818   CMP Latest Ref Rng & Units 04/27/2019 04/13/2019 04/06/2019  Glucose 70 - 99 mg/dL 150(H) 110(H) 103(H)  BUN 8 - 23 mg/dL 10 13 13   Creatinine 0.44 - 1.00 mg/dL 1.05(H) 0.96 1.09(H)  Sodium 135 - 145 mmol/L 139 137 139  Potassium 3.5 - 5.1 mmol/L 3.1(L) 3.5 3.9  Chloride 98 - 111 mmol/L 104 102 103  CO2 22 - 32 mmol/L 22 24 26   Calcium 8.9 - 10.3 mg/dL 9.1 9.2 9.5  Total Protein 6.5 - 8.1 g/dL 6.7 6.9 7.1  Total Bilirubin 0.3 - 1.2 mg/dL 0.7 0.6 0.4  Alkaline Phos 38 - 126 U/L 244(H) 263(H) 327(H)  AST 15 - 41 U/L 20 28 26   ALT 0 - 44 U/L 13 26 27        DIAGNOSTIC IMAGING:  I have independently reviewed the scans and discussed with the patient.   I have reviewed Venita Lick LPN's note and agree with the documentation.  I personally performed a face-to-face visit, made revisions and my assessment and plan is as follows.    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  Metastatic cholangiocarcinoma: - PET CT scan on 01/17/2019 shows hepatic hypermetabolism corresponding to gallbladder fundus with SUV of 9.1.  Focus of central right hepatic lobe hypermetabolism likely corresponds to the site of intrahepatic ductal obstruction on the prior diagnostic CT with SUV of 8.3.  Hypermetabolism corresponding to the porta hepatis presumed nodal mass measuring 3.3  cm. - Core biopsy of the right liver mass consistent with well-differentiated adenocarcinoma, positive for CK7 and CDX 2.  CK20 and TTF-1 are negative.   Differential diagnosis includes an upper GI/pancreaticobiliary primary. -3 cycles of gemcitabine and cisplatin from 02/22/2019 through 04/06/2019. - CEA improved to 33.8 on 03/22/2019 from 44.2 on 02/22/2019. -She missed her Neulasta injection after last cycle. - CT CAP on 04/24/2019 (compared to PET scan from 01/17/2019) did not show any significant change in the index lesions in the liver or enlarged porta hepatic lymph nodes. -She is tolerating chemotherapy very well.  Abdominal pain has improved after the start of chemotherapy. -I have reviewed her blood work.  She may proceed with cycle 4 without any dose modifications.  She will be seen back in 1 week for follow-up and day 8 of cycle 4.  2.  Normocytic anemia: -This is chemotherapy induced.        Total time spent is 25 minutes with more than 50% of the time spent face-to-face discussing and reinforcing treatment plan, scan results and coordination of care.     Orders placed this encounter:  Orders Placed This Encounter  Procedures  . CBC with Differential/Platelet  . Comprehensive metabolic panel  . CEA      Derek Jack, MD [place] [phone]

## 2019-04-28 LAB — CEA: CEA: 16.3 ng/mL — ABNORMAL HIGH (ref 0.0–4.7)

## 2019-05-04 ENCOUNTER — Inpatient Hospital Stay (HOSPITAL_BASED_OUTPATIENT_CLINIC_OR_DEPARTMENT_OTHER): Payer: Medicare Other | Admitting: Hematology

## 2019-05-04 ENCOUNTER — Other Ambulatory Visit: Payer: Self-pay

## 2019-05-04 ENCOUNTER — Inpatient Hospital Stay (HOSPITAL_COMMUNITY): Payer: Medicare Other

## 2019-05-04 ENCOUNTER — Encounter (HOSPITAL_COMMUNITY): Payer: Self-pay | Admitting: Hematology

## 2019-05-04 VITALS — BP 160/79 | HR 66 | Temp 98.0°F | Resp 18

## 2019-05-04 DIAGNOSIS — K5903 Drug induced constipation: Secondary | ICD-10-CM

## 2019-05-04 DIAGNOSIS — Z5189 Encounter for other specified aftercare: Secondary | ICD-10-CM | POA: Diagnosis not present

## 2019-05-04 DIAGNOSIS — G893 Neoplasm related pain (acute) (chronic): Secondary | ICD-10-CM

## 2019-05-04 DIAGNOSIS — Z79899 Other long term (current) drug therapy: Secondary | ICD-10-CM

## 2019-05-04 DIAGNOSIS — C787 Secondary malignant neoplasm of liver and intrahepatic bile duct: Secondary | ICD-10-CM

## 2019-05-04 DIAGNOSIS — C221 Intrahepatic bile duct carcinoma: Secondary | ICD-10-CM

## 2019-05-04 DIAGNOSIS — K829 Disease of gallbladder, unspecified: Secondary | ICD-10-CM | POA: Diagnosis not present

## 2019-05-04 DIAGNOSIS — R5383 Other fatigue: Secondary | ICD-10-CM

## 2019-05-04 DIAGNOSIS — F17210 Nicotine dependence, cigarettes, uncomplicated: Secondary | ICD-10-CM

## 2019-05-04 DIAGNOSIS — I10 Essential (primary) hypertension: Secondary | ICD-10-CM

## 2019-05-04 DIAGNOSIS — D6481 Anemia due to antineoplastic chemotherapy: Secondary | ICD-10-CM

## 2019-05-04 DIAGNOSIS — E875 Hyperkalemia: Secondary | ICD-10-CM

## 2019-05-04 LAB — CBC WITH DIFFERENTIAL/PLATELET
Abs Immature Granulocytes: 0.03 10*3/uL (ref 0.00–0.07)
Basophils Absolute: 0 10*3/uL (ref 0.0–0.1)
Basophils Relative: 1 %
Eosinophils Absolute: 0.1 10*3/uL (ref 0.0–0.5)
Eosinophils Relative: 2 %
HCT: 26.4 % — ABNORMAL LOW (ref 36.0–46.0)
Hemoglobin: 8.7 g/dL — ABNORMAL LOW (ref 12.0–15.0)
Immature Granulocytes: 1 %
Lymphocytes Relative: 63 %
Lymphs Abs: 1.9 10*3/uL (ref 0.7–4.0)
MCH: 33.3 pg (ref 26.0–34.0)
MCHC: 33 g/dL (ref 30.0–36.0)
MCV: 101.1 fL — ABNORMAL HIGH (ref 80.0–100.0)
Monocytes Absolute: 0.1 10*3/uL (ref 0.1–1.0)
Monocytes Relative: 4 %
Neutro Abs: 0.9 10*3/uL — ABNORMAL LOW (ref 1.7–7.7)
Neutrophils Relative %: 29 %
Platelets: 293 10*3/uL (ref 150–400)
RBC: 2.61 MIL/uL — ABNORMAL LOW (ref 3.87–5.11)
RDW: 16.7 % — ABNORMAL HIGH (ref 11.5–15.5)
WBC: 3 10*3/uL — ABNORMAL LOW (ref 4.0–10.5)
nRBC: 0 % (ref 0.0–0.2)

## 2019-05-04 LAB — COMPREHENSIVE METABOLIC PANEL
ALT: 17 U/L (ref 0–44)
AST: 20 U/L (ref 15–41)
Albumin: 3.7 g/dL (ref 3.5–5.0)
Alkaline Phosphatase: 215 U/L — ABNORMAL HIGH (ref 38–126)
Anion gap: 13 (ref 5–15)
BUN: 16 mg/dL (ref 8–23)
CO2: 26 mmol/L (ref 22–32)
Calcium: 9.4 mg/dL (ref 8.9–10.3)
Chloride: 100 mmol/L (ref 98–111)
Creatinine, Ser: 1.08 mg/dL — ABNORMAL HIGH (ref 0.44–1.00)
GFR calc Af Amer: >60 mL/min (ref >60)
GFR calc non Af Amer: 55 mL/min — ABNORMAL LOW (ref >60)
Glucose, Bld: 108 mg/dL — ABNORMAL HIGH (ref 70–99)
Potassium: 3.4 mmol/L — ABNORMAL LOW (ref 3.5–5.1)
Sodium: 139 mmol/L (ref 135–145)
Total Bilirubin: 0.8 mg/dL (ref 0.3–1.2)
Total Protein: 7.2 g/dL (ref 6.5–8.1)

## 2019-05-04 LAB — MAGNESIUM: Magnesium: 1.8 mg/dL (ref 1.7–2.4)

## 2019-05-04 LAB — LACTATE DEHYDROGENASE: LDH: 160 U/L (ref 98–192)

## 2019-05-04 MED ORDER — POTASSIUM CHLORIDE 2 MEQ/ML IV SOLN
Freq: Once | INTRAVENOUS | Status: AC
  Administered 2019-05-04: 09:00:00 via INTRAVENOUS
  Filled 2019-05-04: qty 10

## 2019-05-04 MED ORDER — SODIUM CHLORIDE 0.9 % IV SOLN
Freq: Once | INTRAVENOUS | Status: AC
  Administered 2019-05-04: 09:00:00 via INTRAVENOUS

## 2019-05-04 MED ORDER — SODIUM CHLORIDE 0.9% FLUSH
10.0000 mL | INTRAVENOUS | Status: DC | PRN
  Administered 2019-05-04 (×2): 10 mL
  Filled 2019-05-04 (×2): qty 10

## 2019-05-04 MED ORDER — SODIUM CHLORIDE 0.9 % IV SOLN
25.0000 mg/m2 | Freq: Once | INTRAVENOUS | Status: AC
  Administered 2019-05-04: 52 mg via INTRAVENOUS
  Filled 2019-05-04: qty 52

## 2019-05-04 MED ORDER — PALONOSETRON HCL INJECTION 0.25 MG/5ML
0.2500 mg | Freq: Once | INTRAVENOUS | Status: AC
  Administered 2019-05-04: 0.25 mg via INTRAVENOUS
  Filled 2019-05-04: qty 5

## 2019-05-04 MED ORDER — SODIUM CHLORIDE 0.9 % IV SOLN
Freq: Once | INTRAVENOUS | Status: AC
  Administered 2019-05-04: 11:00:00 via INTRAVENOUS
  Filled 2019-05-04: qty 5

## 2019-05-04 MED ORDER — SODIUM CHLORIDE 0.9 % IV SOLN
750.0000 mg/m2 | Freq: Once | INTRAVENOUS | Status: AC
  Administered 2019-05-04: 1558 mg via INTRAVENOUS
  Filled 2019-05-04: qty 40.98

## 2019-05-04 MED ORDER — HEPARIN SOD (PORK) LOCK FLUSH 100 UNIT/ML IV SOLN
500.0000 [IU] | Freq: Once | INTRAVENOUS | Status: AC | PRN
  Administered 2019-05-04: 500 [IU]

## 2019-05-04 NOTE — Patient Instructions (Signed)
[facility] Discharge Instructions for Patients Receiving Chemotherapy  Today you received the following chemotherapy agents   To help prevent nausea and vomiting after your treatment, we encourage you to take your nausea medication   If you develop nausea and vomiting that is not controlled by your nausea medication, call the clinic.   BELOW ARE SYMPTOMS THAT SHOULD BE REPORTED IMMEDIATELY:  *FEVER GREATER THAN 100.5 F  *CHILLS WITH OR WITHOUT FEVER  NAUSEA AND VOMITING THAT IS NOT CONTROLLED WITH YOUR NAUSEA MEDICATION  *UNUSUAL SHORTNESS OF BREATH  *UNUSUAL BRUISING OR BLEEDING  TENDERNESS IN MOUTH AND THROAT WITH OR WITHOUT PRESENCE OF ULCERS  *URINARY PROBLEMS  *BOWEL PROBLEMS  UNUSUAL RASH Items with * indicate a potential emergency and should be followed up as soon as possible.  Feel free to call the clinic should you have any questions or concerns. The clinic phone number is [phone].  Please show the CHEMO ALERT CARD at check-in to the Emergency Department and triage nurse.   

## 2019-05-04 NOTE — Patient Instructions (Addendum)
[facility] at [facility] Discharge Instructions  You were seen today by Dr. Katragadda. He went over your recent lab results. He will see you back in 2 weeks for labs and follow up.   Thank you for choosing [facility] at [facility] to provide your oncology and hematology care.  To afford each patient quality time with our provider, please arrive at least 15 minutes before your scheduled appointment time.   If you have a lab appointment with the Cancer Center please come in thru the  Main Entrance and check in at the main information desk  You need to re-schedule your appointment should you arrive 10 or more minutes late.  We strive to give you quality time with our providers, and arriving late affects you and other patients whose appointments are after yours.  Also, if you no show three or more times for appointments you may be dismissed from the clinic at the providers discretion.     Again, thank you for choosing [facility].  Our hope is that these requests will decrease the amount of time that you wait before being seen by our physicians.       _____________________________________________________________  Should you have questions after your visit to  Cancer Center, please contact our office at [phone] between the hours of 8:00 a.m. and 4:30 p.m.  Voicemails left after 4:00 p.m. will not be returned until the following business day.  For prescription refill requests, have your pharmacy contact our office and allow 72 hours.    Cancer Center Support Programs:   > Cancer Support Group  2nd Tuesday of the month 1pm-2pm, Journey Room    

## 2019-05-04 NOTE — Progress Notes (Signed)
Pt presents today for treatment and physician appointment. VSS. BP slightly elevated. Within parameters. Labs drawn from port. MAR reviewed. Pt denies any changes since her last visit. No complaints of pain.    Per VO from [facility] LPN. Dr. Delton Coombes to change dose. Upon assessment Dr. Delton Coombes decreased the dose of GEMZAR per tx plan.   Pt voided 135mls of urine.   10:37 pt voided 130mls. Total volume of 250.   Pt informed to return to the CC tomorrow at 3pm for Udenyca injection. Schedule printed off and given to patient. Understanding verbalized.  Treatment given today per MD orders. Tolerated infusion without adverse affects. Vital signs stable. No complaints at this time. Discharged from clinic ambulatory. F/U with [facility] as scheduled.

## 2019-05-04 NOTE — Progress Notes (Signed)
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[place])    02/22/2019 -  Chemotherapy    The patient had palonosetron (ALOXI) injection 0.25 mg, 0.25 mg, Intravenous,  Once, 4 of 6 cycles Administration: 0.25 mg (02/22/2019), 0.25 mg (03/01/2019), 0.25 mg (03/15/2019), 0.25 mg (03/22/2019), 0.25 mg (04/06/2019), 0.25 mg (04/13/2019), 0.25 mg (04/27/2019) pegfilgrastim-cbqv (UDENYCA) injection 6 mg, 6 mg, Subcutaneous, Once, 4 of 6 cycles Administration: 6 mg (03/02/2019), 6 mg (03/24/2019) CISplatin (PLATINOL) 52 mg in sodium chloride 0.9 % 250 mL chemo infusion, 25 mg/m2 = 52 mg, Intravenous,  Once, 4 of 6 cycles Administration: 52 mg (02/22/2019), 52 mg (03/01/2019), 52 mg (03/15/2019), 52 mg (03/22/2019), 52 mg (04/06/2019), 52 mg (04/13/2019), 52 mg (04/27/2019) gemcitabine (GEMZAR) 2,000 mg in sodium chloride 0.9 % 250 mL chemo infusion, 2,090 mg, Intravenous,  Once, 4 of 6 cycles Dose modification: 750 mg/m2 (75 % of original dose 1,000 mg/m2, Cycle 4, Reason: Other (see comments), Comment: neutropenia) Administration: 2,000 mg (02/22/2019), 2,000 mg (03/01/2019), 2,000 mg (03/15/2019), 2,000 mg (03/22/2019), 2,000 mg (04/06/2019), 2,000 mg (04/13/2019), 2,000 mg (04/27/2019) fosaprepitant (EMEND) 150 mg, dexamethasone (DECADRON) 12 mg in sodium chloride 0.9 % 145 mL IVPB, , Intravenous,  Once, 4 of 6 cycles Administration:  (02/22/2019),  (03/01/2019),  (03/15/2019),  (03/22/2019),  (04/06/2019),  (04/13/2019),  (04/27/2019)  for chemotherapy treatment.       CANCER STAGING: Cancer Staging No matching  staging information was found for the patient.   INTERVAL HISTORY:  Ms. Dundon 63 y.o. female returns for routine follow-up and consideration for next cycle of chemotherapy. She is here today alone. She states that she has had constipation, she is not taking the stool softener every day. She was educated on how to take stool softener and miralax. Denies any nausea, vomiting, or diarrhea. Denies any new pains. Had not noticed any recent bleeding such as epistaxis, hematuria or hematochezia. Denies recent chest pain on exertion, shortness of breath on minimal exertion, pre-syncopal episodes, or palpitations. Denies any numbness or tingling in hands or feet. Denies any recent fevers, infections, or recent hospitalizations. Patient reports appetite at 50% and energy level at 50%.     REVIEW OF SYSTEMS:  Review of Systems  Gastrointestinal: Positive for constipation.     PAST MEDICAL/SURGICAL HISTORY:  Past Medical History:  Diagnosis Date  . Anxiety   . Arthritis   . Depression   . Hypertension    Past Surgical History:  Procedure Laterality Date  . COLONOSCOPY N/A 10/23/2016   Procedure: COLONOSCOPY;  Surgeon: Danie Binder, MD;  Location: [facility];  Service: Endoscopy;  Laterality: N/A;  11:30 Am  . POLYPECTOMY  10/23/2016   Procedure: POLYPECTOMY;  Surgeon: Danie Binder, MD;  Location: [facility];  Service: Endoscopy;;  sigmoid colon polyp  . PORTACATH PLACEMENT Left 02/16/2019   Procedure: INSERTION PORT-A-CATH (attached catheter in left subclavian);  Surgeon: Virl Cagey, MD;  Location: [facility];  Service: General;  Laterality: Left;  . RT HIP SURGERY    . TOTAL HIP ARTHROPLASTY Left 04/07/2016   Procedure: LEFT TOTAL  HIP ARTHROPLASTY ANTERIOR APPROACH;  Surgeon: Paralee Cancel, MD;  Location: [facility];  Service: Orthopedics;  Laterality: Left;  Unsuccessful for Spinal, went to General     SOCIAL HISTORY:  Social History   Socioeconomic History  . Marital status:  Single    Spouse name: Not on file  . Number of children: Not on file  . Years of education: Not on file  . Highest education level: Not on file  Occupational History  . Not on file  Social Needs  . Financial resource strain: Not hard at all  . Food insecurity:    Worry: Never true    Inability: Never true  . Transportation needs:    Medical: No    Non-medical: No  Tobacco Use  . Smoking status: Current Every Day Smoker    Packs/day: 0.25    Years: 42.00    Pack years: 10.50  . Smokeless tobacco: Never Used  Substance and Sexual Activity  . Alcohol use: No  . Drug use: No  . Sexual activity: Not Currently  Lifestyle  . Physical activity:    Days per week: 0 days    Minutes per session: 0 min  . Stress: Only a little  Relationships  . Social connections:    Talks on phone: Three times a week    Gets together: Three times a week    Attends religious service: More than 4 times per year    Active member of club or organization: Yes    Attends meetings of clubs or organizations: More than 4 times per year    Relationship status: Never married  . Intimate partner violence:    Fear of current or ex partner: No    Emotionally abused: No    Physically abused: No    Forced sexual activity: No  Other Topics Concern  . Not on file  Social History Narrative  . Not on file    FAMILY HISTORY:  Family History  Problem Relation Age of Onset  . Hypertension Mother   . Cancer Father   . Hypertension Brother   . Cancer Brother   . Hypertension Sister   . Cancer Sister     CURRENT MEDICATIONS:  Outpatient Encounter Medications as of 05/04/2019  Medication Sig  . amLODIPine-Valsartan-HCTZ 10-320-25 MG TABS Take 1 tablet by mouth daily.  Marland Kitchen CISPLATIN IV Inject into the vein. Day 1, 8 every 21 days  . doxazosin (CARDURA) 2 MG tablet Take 2 mg by mouth every evening.   Marland Kitchen GEMCITABINE HCL IV Inject into the vein. Day 1, 8 every 21 days  . HYDROcodone-acetaminophen (NORCO) 5-325  MG tablet Take 1 tablet by mouth every 4 (four) hours as needed for moderate pain. (Patient not taking: Reported on 05/04/2019)  . lidocaine (XYLOCAINE) 2 % solution Swish and swallow 1 tablespoon four times a day prn sore mouth  . lidocaine-prilocaine (EMLA) cream Apply to skin over port a cath one hour prior to chemotherapy appointment  . naproxen sodium (ALEVE) 220 MG tablet Take 220 mg by mouth 2 (two) times daily as needed.   . prochlorperazine (COMPAZINE) 10 MG tablet Take 1 tablet (10 mg total) by mouth every 6 (six) hours as needed (Nausea or vomiting).  . vitamin B-12 (CYANOCOBALAMIN) 50 MCG tablet Take 50 mcg by mouth daily.  . [DISCONTINUED] atorvastatin (LIPITOR) 20 MG tablet Take 20 mg by mouth 3 (three) times a week.    No facility-administered encounter medications on file as of 05/04/2019.  ALLERGIES:  No Known Allergies   PHYSICAL EXAM:  ECOG Performance status: 1  Vitals:   05/04/19 0822  BP: 139/62  Pulse: (!) 58  Resp: 18  Temp: 98.1 F (36.7 C)  SpO2: 100%   Filed Weights   05/04/19 0822  Weight: 207 lb 9.6 oz (94.2 kg)    Physical Exam Vitals signs reviewed.  Constitutional:      Appearance: Normal appearance.  Cardiovascular:     Rate and Rhythm: Normal rate and regular rhythm.     Heart sounds: Normal heart sounds.  Pulmonary:     Effort: Pulmonary effort is normal.     Breath sounds: Normal breath sounds.  Abdominal:     General: There is no distension.     Palpations: Abdomen is soft. There is no mass.  Musculoskeletal:        General: No swelling.  Skin:    General: Skin is warm.  Neurological:     General: No focal deficit present.     Mental Status: She is alert and oriented to person, place, and time.  Psychiatric:        Mood and Affect: Mood normal.        Behavior: Behavior normal.      LABORATORY DATA:  I have reviewed the labs as listed.  CBC    Component Value Date/Time   WBC 3.0 (L) 05/04/2019 0811   RBC 2.61 (L)  05/04/2019 0811   HGB 8.7 (L) 05/04/2019 0811   HCT 26.4 (L) 05/04/2019 0811   PLT 293 05/04/2019 0811   MCV 101.1 (H) 05/04/2019 0811   MCH 33.3 05/04/2019 0811   MCHC 33.0 05/04/2019 0811   RDW 16.7 (H) 05/04/2019 0811   LYMPHSABS 1.9 05/04/2019 0811   MONOABS 0.1 05/04/2019 0811   EOSABS 0.1 05/04/2019 0811   BASOSABS 0.0 05/04/2019 0811   CMP Latest Ref Rng & Units 05/04/2019 04/27/2019 04/13/2019  Glucose 70 - 99 mg/dL 108(H) 150(H) 110(H)  BUN 8 - 23 mg/dL 16 10 13   Creatinine 0.44 - 1.00 mg/dL 1.08(H) 1.05(H) 0.96  Sodium 135 - 145 mmol/L 139 139 137  Potassium 3.5 - 5.1 mmol/L 3.4(L) 3.1(L) 3.5  Chloride 98 - 111 mmol/L 100 104 102  CO2 22 - 32 mmol/L 26 22 24   Calcium 8.9 - 10.3 mg/dL 9.4 9.1 9.2  Total Protein 6.5 - 8.1 g/dL 7.2 6.7 6.9  Total Bilirubin 0.3 - 1.2 mg/dL 0.8 0.7 0.6  Alkaline Phos 38 - 126 U/L 215(H) 244(H) 263(H)  AST 15 - 41 U/L 20 20 28   ALT 0 - 44 U/L 17 13 26        DIAGNOSTIC IMAGING:  I have independently reviewed the scans and discussed with the patient.   I have reviewed Venita Lick LPN's note and agree with the documentation.  I personally performed a face-to-face visit, made revisions and my assessment and plan is as follows.    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  Metastatic cholangiocarcinoma: - PET CT scan on 01/17/2019 shows hepatic hypermetabolism corresponding to gallbladder fundus with SUV of 9.1.  Focus of central right hepatic lobe hypermetabolism likely corresponds to the site of intrahepatic ductal obstruction on the prior diagnostic CT with SUV of 8.3.  Hypermetabolism corresponding to the porta hepatis presumed nodal mass measuring 3.3 cm. - Core biopsy of the right liver mass consistent with well-differentiated adenocarcinoma, positive for CK7 and CDX 2.  CK20 and TTF-1 are negative.  Differential  diagnosis includes an upper GI/pancreaticobiliary primary. -3 cycles of gemcitabine and cisplatin  from 02/22/2019 through 04/06/2019. - CEA improved from 44 to16.3 on 04/27/2019.  -CT CAP on 04/24/2019 (compared to PET scan from 01/17/2019) did not show any significant change in the index lesions in the liver or enlarged porta hepatic lymph nodes. -Cycle 4-day 1 on 04/27/2019. - She had a lot of constipation after last treatment.  She was told to take stool softener on a daily basis.  She was also told to use MiraLAX as needed. -We reviewed her blood work.  ANC is 900.  We will dose reduce gemcitabine to 750 mg per metered square.  She will receive Neulasta. - We will see her back in 2 weeks for follow-up and start of cycle 5.  2.  Normocytic anemia: -This is chemotherapy-induced.  Hemoglobin is stable at 8.7.       Total time spent is 25 minutes with more than 50% of the time spent face-to-face discussing treatment plan and coordination of care.     Orders placed this encounter:  No orders of the defined types were placed in this encounter.     Derek Jack, MD [place] [phone]

## 2019-05-04 NOTE — Assessment & Plan Note (Addendum)
1.  Metastatic cholangiocarcinoma: - PET CT scan on 01/17/2019 shows hepatic hypermetabolism corresponding to gallbladder fundus with SUV of 9.1.  Focus of central right hepatic lobe hypermetabolism likely corresponds to the site of intrahepatic ductal obstruction on the prior diagnostic CT with SUV of 8.3.  Hypermetabolism corresponding to the porta hepatis presumed nodal mass measuring 3.3 cm. - Core biopsy of the right liver mass consistent with well-differentiated adenocarcinoma, positive for CK7 and CDX 2.  CK20 and TTF-1 are negative.  Differential diagnosis includes an upper GI/pancreaticobiliary primary. -3 cycles of gemcitabine and cisplatin from 02/22/2019 through 04/06/2019. - CEA improved from 44 to16.3 on 04/27/2019.  -CT CAP on 04/24/2019 (compared to PET scan from 01/17/2019) did not show any significant change in the index lesions in the liver or enlarged porta hepatic lymph nodes. -Cycle 4-day 1 on 04/27/2019. - She had a lot of constipation after last treatment.  She was told to take stool softener on a daily basis.  She was also told to use MiraLAX as needed. -We reviewed her blood work.  ANC is 900.  We will dose reduce gemcitabine to 750 mg per metered square.  She will receive Neulasta. - We will see her back in 2 weeks for follow-up and start of cycle 5.  2.  Normocytic anemia: -This is chemotherapy-induced.  Hemoglobin is stable at 8.7.

## 2019-05-05 ENCOUNTER — Inpatient Hospital Stay (HOSPITAL_COMMUNITY): Payer: Medicare Other

## 2019-05-05 VITALS — BP 129/61 | HR 70 | Temp 98.1°F | Resp 18

## 2019-05-05 DIAGNOSIS — C221 Intrahepatic bile duct carcinoma: Secondary | ICD-10-CM

## 2019-05-05 DIAGNOSIS — Z5189 Encounter for other specified aftercare: Secondary | ICD-10-CM | POA: Diagnosis not present

## 2019-05-05 MED ORDER — PEGFILGRASTIM-CBQV 6 MG/0.6ML ~~LOC~~ SOSY
6.0000 mg | PREFILLED_SYRINGE | Freq: Once | SUBCUTANEOUS | Status: AC
  Administered 2019-05-05: 6 mg via SUBCUTANEOUS

## 2019-05-05 MED ORDER — PEGFILGRASTIM-CBQV 6 MG/0.6ML ~~LOC~~ SOSY
PREFILLED_SYRINGE | SUBCUTANEOUS | Status: AC
  Filled 2019-05-05: qty 0.6

## 2019-05-05 NOTE — Progress Notes (Signed)
Nicole Ibarra presents today for injection per the provider's orders.  Udenyca administration without incident; see MAR for injection details.  Patient tolerated procedure well and without incident.  No questions or complaints noted at this time. D/c ambulatory

## 2019-05-17 ENCOUNTER — Other Ambulatory Visit: Payer: Self-pay

## 2019-05-18 ENCOUNTER — Other Ambulatory Visit: Payer: Self-pay

## 2019-05-18 ENCOUNTER — Inpatient Hospital Stay (HOSPITAL_COMMUNITY): Payer: Medicare Other | Attending: Hematology | Admitting: Hematology

## 2019-05-18 ENCOUNTER — Inpatient Hospital Stay (HOSPITAL_COMMUNITY): Payer: Medicare Other

## 2019-05-18 ENCOUNTER — Encounter (HOSPITAL_COMMUNITY): Payer: Self-pay | Admitting: Hematology

## 2019-05-18 ENCOUNTER — Other Ambulatory Visit (HOSPITAL_COMMUNITY): Payer: Self-pay | Admitting: Hematology

## 2019-05-18 VITALS — BP 155/69 | HR 65 | Temp 98.0°F | Resp 18

## 2019-05-18 VITALS — BP 151/49 | HR 66 | Temp 98.1°F | Resp 18 | Wt 205.0 lb

## 2019-05-18 DIAGNOSIS — R970 Elevated carcinoembryonic antigen [CEA]: Secondary | ICD-10-CM | POA: Diagnosis not present

## 2019-05-18 DIAGNOSIS — C221 Intrahepatic bile duct carcinoma: Secondary | ICD-10-CM | POA: Insufficient documentation

## 2019-05-18 DIAGNOSIS — Z5111 Encounter for antineoplastic chemotherapy: Secondary | ICD-10-CM | POA: Diagnosis present

## 2019-05-18 DIAGNOSIS — F17210 Nicotine dependence, cigarettes, uncomplicated: Secondary | ICD-10-CM | POA: Insufficient documentation

## 2019-05-18 DIAGNOSIS — I10 Essential (primary) hypertension: Secondary | ICD-10-CM | POA: Insufficient documentation

## 2019-05-18 DIAGNOSIS — F419 Anxiety disorder, unspecified: Secondary | ICD-10-CM | POA: Diagnosis not present

## 2019-05-18 DIAGNOSIS — F329 Major depressive disorder, single episode, unspecified: Secondary | ICD-10-CM | POA: Insufficient documentation

## 2019-05-18 DIAGNOSIS — Z5189 Encounter for other specified aftercare: Secondary | ICD-10-CM | POA: Insufficient documentation

## 2019-05-18 DIAGNOSIS — R112 Nausea with vomiting, unspecified: Secondary | ICD-10-CM | POA: Diagnosis not present

## 2019-05-18 DIAGNOSIS — E876 Hypokalemia: Secondary | ICD-10-CM

## 2019-05-18 DIAGNOSIS — D649 Anemia, unspecified: Secondary | ICD-10-CM | POA: Diagnosis not present

## 2019-05-18 DIAGNOSIS — C787 Secondary malignant neoplasm of liver and intrahepatic bile duct: Secondary | ICD-10-CM | POA: Diagnosis not present

## 2019-05-18 DIAGNOSIS — Z79899 Other long term (current) drug therapy: Secondary | ICD-10-CM | POA: Diagnosis not present

## 2019-05-18 LAB — CBC WITH DIFFERENTIAL/PLATELET
Abs Immature Granulocytes: 0.12 10*3/uL — ABNORMAL HIGH (ref 0.00–0.07)
Basophils Absolute: 0 10*3/uL (ref 0.0–0.1)
Basophils Relative: 0 %
Eosinophils Absolute: 0.2 10*3/uL (ref 0.0–0.5)
Eosinophils Relative: 2 %
HCT: 26.1 % — ABNORMAL LOW (ref 36.0–46.0)
Hemoglobin: 8.6 g/dL — ABNORMAL LOW (ref 12.0–15.0)
Immature Granulocytes: 1 %
Lymphocytes Relative: 25 %
Lymphs Abs: 2.3 10*3/uL (ref 0.7–4.0)
MCH: 34 pg (ref 26.0–34.0)
MCHC: 33 g/dL (ref 30.0–36.0)
MCV: 103.2 fL — ABNORMAL HIGH (ref 80.0–100.0)
Monocytes Absolute: 0.8 10*3/uL (ref 0.1–1.0)
Monocytes Relative: 9 %
Neutro Abs: 5.7 10*3/uL (ref 1.7–7.7)
Neutrophils Relative %: 63 %
Platelets: 189 10*3/uL (ref 150–400)
RBC: 2.53 MIL/uL — ABNORMAL LOW (ref 3.87–5.11)
RDW: 17.9 % — ABNORMAL HIGH (ref 11.5–15.5)
WBC: 9.1 10*3/uL (ref 4.0–10.5)
nRBC: 0 % (ref 0.0–0.2)

## 2019-05-18 LAB — COMPREHENSIVE METABOLIC PANEL
ALT: 9 U/L (ref 0–44)
AST: 14 U/L — ABNORMAL LOW (ref 15–41)
Albumin: 3.8 g/dL (ref 3.5–5.0)
Alkaline Phosphatase: 171 U/L — ABNORMAL HIGH (ref 38–126)
Anion gap: 8 (ref 5–15)
BUN: 13 mg/dL (ref 8–23)
CO2: 28 mmol/L (ref 22–32)
Calcium: 9.4 mg/dL (ref 8.9–10.3)
Chloride: 103 mmol/L (ref 98–111)
Creatinine, Ser: 1.27 mg/dL — ABNORMAL HIGH (ref 0.44–1.00)
GFR calc Af Amer: 52 mL/min — ABNORMAL LOW (ref >60)
GFR calc non Af Amer: 45 mL/min — ABNORMAL LOW (ref >60)
Glucose, Bld: 107 mg/dL — ABNORMAL HIGH (ref 70–99)
Potassium: 3.1 mmol/L — ABNORMAL LOW (ref 3.5–5.1)
Sodium: 139 mmol/L (ref 135–145)
Total Bilirubin: 0.5 mg/dL (ref 0.3–1.2)
Total Protein: 6.8 g/dL (ref 6.5–8.1)

## 2019-05-18 LAB — LACTATE DEHYDROGENASE: LDH: 180 U/L (ref 98–192)

## 2019-05-18 LAB — MAGNESIUM: Magnesium: 1.9 mg/dL (ref 1.7–2.4)

## 2019-05-18 MED ORDER — POTASSIUM CHLORIDE CRYS ER 20 MEQ PO TBCR: 20.0000 meq | EXTENDED_RELEASE_TABLET | Freq: Two times a day (BID) | ORAL | 2 refills | Status: DC

## 2019-05-18 MED ORDER — POTASSIUM CHLORIDE 2 MEQ/ML IV SOLN
Freq: Once | INTRAVENOUS | Status: AC
  Administered 2019-05-18: 08:00:00 via INTRAVENOUS
  Filled 2019-05-18: qty 10

## 2019-05-18 MED ORDER — SODIUM CHLORIDE 0.9 % IV SOLN
Freq: Once | INTRAVENOUS | Status: AC
  Administered 2019-05-18: 10:00:00 via INTRAVENOUS
  Filled 2019-05-18: qty 5

## 2019-05-18 MED ORDER — SODIUM CHLORIDE 0.9% FLUSH
10.0000 mL | INTRAVENOUS | Status: DC | PRN
  Administered 2019-05-18: 10 mL
  Filled 2019-05-18: qty 10

## 2019-05-18 MED ORDER — SODIUM CHLORIDE 0.9 % IV SOLN
Freq: Once | INTRAVENOUS | Status: AC
  Administered 2019-05-18: 10:00:00 via INTRAVENOUS

## 2019-05-18 MED ORDER — SODIUM CHLORIDE 0.9 % IV SOLN
25.0000 mg/m2 | Freq: Once | INTRAVENOUS | Status: AC
  Administered 2019-05-18: 52 mg via INTRAVENOUS
  Filled 2019-05-18: qty 52

## 2019-05-18 MED ORDER — PALONOSETRON HCL INJECTION 0.25 MG/5ML
0.2500 mg | Freq: Once | INTRAVENOUS | Status: AC
  Administered 2019-05-18: 0.25 mg via INTRAVENOUS
  Filled 2019-05-18: qty 5

## 2019-05-18 MED ORDER — SODIUM CHLORIDE 0.9 % IV SOLN
2000.0000 mg | Freq: Once | INTRAVENOUS | Status: AC
  Administered 2019-05-18: 2000 mg via INTRAVENOUS
  Filled 2019-05-18: qty 52.6

## 2019-05-18 MED ORDER — HEPARIN SOD (PORK) LOCK FLUSH 100 UNIT/ML IV SOLN
500.0000 [IU] | Freq: Once | INTRAVENOUS | Status: AC | PRN
  Administered 2019-05-18: 500 [IU]

## 2019-05-18 NOTE — Assessment & Plan Note (Addendum)
1.  Metastatic cholangiocarcinoma: - PET CT scan on 01/17/2019 shows hepatic hypermetabolism corresponding to gallbladder fundus with SUV of 9.1.  Focus of central right hepatic lobe hypermetabolism likely corresponds to the site of intrahepatic ductal obstruction on the prior diagnostic CT with SUV of 8.3.  Hypermetabolism corresponding to the porta hepatis presumed nodal mass measuring 3.3 cm. - Core biopsy of the right liver mass consistent with well-differentiated adenocarcinoma, positive for CK7 and CDX 2.  CK20 and TTF-1 are negative.  Differential diagnosis includes an upper GI/pancreaticobiliary primary. -4 cycles of gemcitabine and cisplatin from 02/22/2019 through 04/27/2019.  - CEA improved from 44-16.3 on 04/27/2019. -CT CAP after 3 cycles on 04/24/2019 (compared to PET scan from 01/17/2019) did not show significant change in the index lesions in the liver or enlarged porta hepatic lymph nodes. - She was told to take stool softener and MiraLAX for constipation. -She had mild fatigue but otherwise did not have any neuropathy symptoms.  Denies any ringing in the ears or hearing loss. - Potassium is low at 3.1 today.  We will start her on potassium 20 mEq daily. - Creatinine has increased to 1.27 from 1.08 previously.  Hence I have recommended IV fluids tomorrow. - She may proceed with cycle 4 today without any dose modifications.  I will see her back in 1 week for follow-up.   2.  Normocytic anemia: -This is chemotherapy-induced.  Hemoglobin is stable at 8.7.

## 2019-05-18 NOTE — Progress Notes (Signed)
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[place])   02/22/2019 -  Chemotherapy   The patient had palonosetron (ALOXI) injection 0.25 mg, 0.25 mg, Intravenous,  Once, 5 of 6 cycles Administration: 0.25 mg (02/22/2019), 0.25 mg (03/01/2019), 0.25 mg (03/15/2019), 0.25 mg (03/22/2019), 0.25 mg (04/06/2019), 0.25 mg (04/13/2019), 0.25 mg (04/27/2019), 0.25 mg (05/04/2019) pegfilgrastim-cbqv (UDENYCA) injection 6 mg, 6 mg, Subcutaneous, Once, 5 of 6 cycles Administration: 6 mg (03/02/2019), 6 mg (03/24/2019), 6 mg (05/05/2019) CISplatin (PLATINOL) 52 mg in sodium chloride 0.9 % 250 mL chemo infusion, 25 mg/m2 = 52 mg, Intravenous,  Once, 5 of 6 cycles Administration: 52 mg (02/22/2019), 52 mg (03/01/2019), 52 mg (03/15/2019), 52 mg (03/22/2019), 52 mg (04/06/2019), 52 mg (04/13/2019), 52 mg (04/27/2019), 52 mg (05/04/2019) gemcitabine (GEMZAR) 2,000 mg in sodium chloride 0.9 % 250 mL chemo infusion, 2,090 mg, Intravenous,  Once, 5 of 6 cycles Dose modification: 750 mg/m2 (75 % of original dose 1,000 mg/m2, Cycle 4, Reason: Other (see comments), Comment: neutropenia) Administration: 2,000 mg (02/22/2019), 2,000 mg (03/01/2019), 2,000 mg (03/15/2019), 2,000 mg (03/22/2019), 2,000 mg (04/06/2019), 2,000 mg (04/13/2019), 2,000 mg (04/27/2019), 1,558 mg (05/04/2019) fosaprepitant (EMEND) 150 mg, dexamethasone (DECADRON) 12 mg in sodium chloride 0.9 % 145 mL IVPB, , Intravenous,  Once, 5 of 6 cycles Administration:  (02/22/2019),  (03/01/2019),  (03/15/2019),  (03/22/2019),  (04/06/2019),  (04/13/2019),   (04/27/2019),  (05/04/2019)  for chemotherapy treatment.       CANCER STAGING: Cancer Staging No matching staging information was found for the patient.   INTERVAL HISTORY:  Ms. Teaney 63 y.o. female returns for follow-up and cycle 5 of chemotherapy.  She received cycle 4 on 04/27/2019.  She felt tired over the last 2 weeks.  However she is able to do all her activities.  She lost about couple of pounds.  Eating has not been good as appetite is at 25%.  Energy levels are 50%.  Denies any tingling or numbness next images.  Denies any ringing in the ears or hearing loss.  Denies any fevers or night sweats or weight loss.  No infections or hospitalizations.      REVIEW OF SYSTEMS:  Review of Systems  Constitutional: Positive for fatigue.  Gastrointestinal: Negative for constipation.  All other systems reviewed and are negative.    PAST MEDICAL/SURGICAL HISTORY:  Past Medical History:  Diagnosis Date  . Anxiety   . Arthritis   . Depression   . Hypertension    Past Surgical History:  Procedure Laterality Date  . COLONOSCOPY N/A 10/23/2016   Procedure: COLONOSCOPY;  Surgeon: Danie Binder, MD;  Location: [facility];  Service: Endoscopy;  Laterality: N/A;  11:30 Am  . POLYPECTOMY  10/23/2016   Procedure: POLYPECTOMY;  Surgeon: Danie Binder, MD;  Location: [facility];  Service: Endoscopy;;  sigmoid colon polyp  . PORTACATH PLACEMENT Left 02/16/2019   Procedure: INSERTION PORT-A-CATH (attached catheter in left subclavian);  Surgeon: Virl Cagey, MD;  Location: [facility] ORS;  Service: General;  Laterality: Left;  . RT HIP SURGERY    . TOTAL HIP ARTHROPLASTY Left  04/07/2016   Procedure: LEFT TOTAL HIP ARTHROPLASTY ANTERIOR APPROACH;  Surgeon: Paralee Cancel, MD;  Location: [facility];  Service: Orthopedics;  Laterality: Left;  Unsuccessful for Spinal, went to General     SOCIAL HISTORY:  Social History   Socioeconomic History  . Marital status: Single    Spouse name: Not on  file  . Number of children: Not on file  . Years of education: Not on file  . Highest education level: Not on file  Occupational History  . Not on file  Social Needs  . Financial resource strain: Not hard at all  . Food insecurity    Worry: Never true    Inability: Never true  . Transportation needs    Medical: No    Non-medical: No  Tobacco Use  . Smoking status: Current Every Day Smoker    Packs/day: 0.25    Years: 42.00    Pack years: 10.50  . Smokeless tobacco: Never Used  Substance and Sexual Activity  . Alcohol use: No  . Drug use: No  . Sexual activity: Not Currently  Lifestyle  . Physical activity    Days per week: 0 days    Minutes per session: 0 min  . Stress: Only a little  Relationships  . Social Herbalist on phone: Three times a week    Gets together: Three times a week    Attends religious service: More than 4 times per year    Active member of club or organization: Yes    Attends meetings of clubs or organizations: More than 4 times per year    Relationship status: Never married  . Intimate partner violence    Fear of current or ex partner: No    Emotionally abused: No    Physically abused: No    Forced sexual activity: No  Other Topics Concern  . Not on file  Social History Narrative  . Not on file    FAMILY HISTORY:  Family History  Problem Relation Age of Onset  . Hypertension Mother   . Cancer Father   . Hypertension Brother   . Cancer Brother   . Hypertension Sister   . Cancer Sister     CURRENT MEDICATIONS:  Outpatient Encounter Medications as of 05/18/2019  Medication Sig  . amLODIPine-Valsartan-HCTZ 10-320-25 MG TABS Take 1 tablet by mouth daily.  Marland Kitchen CISPLATIN IV Inject into the vein. Day 1, 8 every 21 days  . doxazosin (CARDURA) 2 MG tablet Take 2 mg by mouth every evening.   Marland Kitchen GEMCITABINE HCL IV Inject into the vein. Day 1, 8 every 21 days  . HYDROcodone-acetaminophen (NORCO) 5-325 MG tablet Take 1 tablet by mouth  every 4 (four) hours as needed for moderate pain.  Marland Kitchen lidocaine (XYLOCAINE) 2 % solution Swish and swallow 1 tablespoon four times a day prn sore mouth  . lidocaine-prilocaine (EMLA) cream Apply to skin over port a cath one hour prior to chemotherapy appointment  . naproxen sodium (ALEVE) 220 MG tablet Take 220 mg by mouth 2 (two) times daily as needed.   . potassium chloride SA (K-DUR) 20 MEQ tablet Take 1 tablet (20 mEq total) by mouth 2 (two) times daily.  . prochlorperazine (COMPAZINE) 10 MG tablet Take 1 tablet (10 mg total) by mouth every 6 (six) hours as needed (Nausea or vomiting).  . vitamin B-12 (CYANOCOBALAMIN) 50 MCG tablet Take 50 mcg by mouth daily.   No facility-administered encounter medications on file as of 05/18/2019.  ALLERGIES:  No Known Allergies   PHYSICAL EXAM:  ECOG Performance status: 1  Vitals:   05/18/19 0756  BP: (!) 151/49  Pulse: 66  Resp: 18  Temp: 98.1 F (36.7 C)  SpO2: 100%   Filed Weights   05/18/19 0756  Weight: 205 lb (93 kg)    Physical Exam Vitals signs reviewed.  Constitutional:      Appearance: Normal appearance.  Cardiovascular:     Rate and Rhythm: Normal rate and regular rhythm.     Heart sounds: Normal heart sounds.  Pulmonary:     Effort: Pulmonary effort is normal.     Breath sounds: Normal breath sounds.  Abdominal:     General: There is no distension.     Palpations: Abdomen is soft. There is no mass.  Musculoskeletal:        General: No swelling.  Skin:    General: Skin is warm.  Neurological:     General: No focal deficit present.     Mental Status: She is alert and oriented to person, place, and time.  Psychiatric:        Mood and Affect: Mood normal.        Behavior: Behavior normal.      LABORATORY DATA:  I have reviewed the labs as listed.  CBC    Component Value Date/Time   WBC 9.1 05/18/2019 0807   RBC 2.53 (L) 05/18/2019 0807   HGB 8.6 (L) 05/18/2019 0807   HCT 26.1 (L) 05/18/2019 0807    PLT 189 05/18/2019 0807   MCV 103.2 (H) 05/18/2019 0807   MCH 34.0 05/18/2019 0807   MCHC 33.0 05/18/2019 0807   RDW 17.9 (H) 05/18/2019 0807   LYMPHSABS 2.3 05/18/2019 0807   MONOABS 0.8 05/18/2019 0807   EOSABS 0.2 05/18/2019 0807   BASOSABS 0.0 05/18/2019 0807   CMP Latest Ref Rng & Units 05/18/2019 05/04/2019 04/27/2019  Glucose 70 - 99 mg/dL 107(H) 108(H) 150(H)  BUN 8 - 23 mg/dL 13 16 10   Creatinine 0.44 - 1.00 mg/dL 1.27(H) 1.08(H) 1.05(H)  Sodium 135 - 145 mmol/L 139 139 139  Potassium 3.5 - 5.1 mmol/L 3.1(L) 3.4(L) 3.1(L)  Chloride 98 - 111 mmol/L 103 100 104  CO2 22 - 32 mmol/L 28 26 22   Calcium 8.9 - 10.3 mg/dL 9.4 9.4 9.1  Total Protein 6.5 - 8.1 g/dL 6.8 7.2 6.7  Total Bilirubin 0.3 - 1.2 mg/dL 0.5 0.8 0.7  Alkaline Phos 38 - 126 U/L 171(H) 215(H) 244(H)  AST 15 - 41 U/L 14(L) 20 20  ALT 0 - 44 U/L 9 17 13        DIAGNOSTIC IMAGING:  I have independently reviewed the scans and discussed with the patient.   I have reviewed Venita Lick LPN's note and agree with the documentation.  I personally performed a face-to-face visit, made revisions and my assessment and plan is as follows.    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  Metastatic cholangiocarcinoma: - PET CT scan on 01/17/2019 shows hepatic hypermetabolism corresponding to gallbladder fundus with SUV of 9.1.  Focus of central right hepatic lobe hypermetabolism likely corresponds to the site of intrahepatic ductal obstruction on the prior diagnostic CT with SUV of 8.3.  Hypermetabolism corresponding to the porta hepatis presumed nodal mass measuring 3.3 cm. - Core biopsy of the right liver mass consistent with well-differentiated adenocarcinoma, positive for CK7 and CDX 2.  CK20 and TTF-1 are negative.  Differential diagnosis includes an  upper GI/pancreaticobiliary primary. -4 cycles of gemcitabine and cisplatin from 02/22/2019 through 04/27/2019.  - CEA improved from 44-16.3 on  04/27/2019. -CT CAP after 3 cycles on 04/24/2019 (compared to PET scan from 01/17/2019) did not show significant change in the index lesions in the liver or enlarged porta hepatic lymph nodes. - She was told to take stool softener and MiraLAX for constipation. -She had mild fatigue but otherwise did not have any neuropathy symptoms.  Denies any ringing in the ears or hearing loss. - Potassium is low at 3.1 today.  We will start her on potassium 20 mEq daily. - Creatinine has increased to 1.27 from 1.08 previously.  Hence I have recommended IV fluids tomorrow. - She may proceed with cycle 4 today without any dose modifications.  I will see her back in 1 week for follow-up.   2.  Normocytic anemia: -This is chemotherapy-induced.  Hemoglobin is stable at 8.7.       Total time spent is 25 minutes with more than 50% of the time spent face-to-face discussing treatment plan and coordination of care.     Orders placed this encounter:  Orders Placed This Encounter  Procedures  . CBC with Differential/Platelet  . Comprehensive metabolic panel  . Magnesium  . Lactate dehydrogenase  . CEA      Derek Jack, MD [place] [phone]

## 2019-05-18 NOTE — Progress Notes (Signed)
05/18/19  Confirmed dose of gemcitabine today is 1000 mg/m2  V.O. Dr Rhys Martini, PharmD

## 2019-05-18 NOTE — Progress Notes (Signed)
Pt presents today for office visit with Dr. Delton Coombes and treatment today. VSS and within parameters for treatment.   Message received proceed with treatment. Reported via instant message k+ 3.1.   Pt voided 243mls at 10:30am.   Treatment given today per MD orders. Tolerated infusion without adverse affects. Vital signs stable. No complaints at this time. Discharged from clinic ambulatory. F/U with [facility] as scheduled.

## 2019-05-18 NOTE — Patient Instructions (Addendum)
[facility] at [facility] Discharge Instructions  You were seen today by Dr. Katragadda. He went over your recent lab results. He will see you back in 1 week for labs and follow up.   Thank you for choosing [facility] at [facility] to provide your oncology and hematology care.  To afford each patient quality time with our provider, please arrive at least 15 minutes before your scheduled appointment time.   If you have a lab appointment with the Cancer Center please come in thru the  Main Entrance and check in at the main information desk  You need to re-schedule your appointment should you arrive 10 or more minutes late.  We strive to give you quality time with our providers, and arriving late affects you and other patients whose appointments are after yours.  Also, if you no show three or more times for appointments you may be dismissed from the clinic at the providers discretion.     Again, thank you for choosing [facility].  Our hope is that these requests will decrease the amount of time that you wait before being seen by our physicians.       _____________________________________________________________  Should you have questions after your visit to [facility], please contact our office at [phone] between the hours of 8:00 a.m. and 4:30 p.m.  Voicemails left after 4:00 p.m. will not be returned until the following business day.  For prescription refill requests, have your pharmacy contact our office and allow 72 hours.    Cancer Center Support Programs:   > Cancer Support Group  2nd Tuesday of the month 1pm-2pm, Journey Room    

## 2019-05-19 ENCOUNTER — Inpatient Hospital Stay (HOSPITAL_COMMUNITY): Payer: Medicare Other

## 2019-05-19 VITALS — BP 160/67 | HR 62 | Temp 97.9°F | Resp 18

## 2019-05-19 DIAGNOSIS — C221 Intrahepatic bile duct carcinoma: Secondary | ICD-10-CM

## 2019-05-19 DIAGNOSIS — Z5111 Encounter for antineoplastic chemotherapy: Secondary | ICD-10-CM | POA: Diagnosis not present

## 2019-05-19 DIAGNOSIS — C787 Secondary malignant neoplasm of liver and intrahepatic bile duct: Secondary | ICD-10-CM

## 2019-05-19 LAB — CEA: CEA: 12.6 ng/mL — ABNORMAL HIGH (ref 0.0–4.7)

## 2019-05-19 MED ORDER — SODIUM CHLORIDE 0.9% FLUSH
10.0000 mL | Freq: Once | INTRAVENOUS | Status: AC | PRN
  Administered 2019-05-19: 10 mL

## 2019-05-19 MED ORDER — HEPARIN SOD (PORK) LOCK FLUSH 100 UNIT/ML IV SOLN
500.0000 [IU] | Freq: Once | INTRAVENOUS | Status: AC | PRN
  Administered 2019-05-19: 500 [IU]

## 2019-05-19 MED ORDER — SODIUM CHLORIDE 0.9 % IV SOLN
Freq: Once | INTRAVENOUS | Status: AC
  Administered 2019-05-19: 10:00:00 via INTRAVENOUS
  Filled 2019-05-19: qty 1000

## 2019-05-19 NOTE — Patient Instructions (Signed)
[facility] at [facility]  Discharge Instructions:   _______________________________________________________________  Thank you for choosing [facility] at [facility] to provide your oncology and hematology care.  To afford each patient quality time with our providers, please arrive at least 15 minutes before your scheduled appointment.  You need to re-schedule your appointment if you arrive 10 or more minutes late.  We strive to give you quality time with our providers, and arriving late affects you and other patients whose appointments are after yours.  Also, if you no show three or more times for appointments you may be dismissed from the clinic.  Again, thank you for choosing  Cancer Center at [facility]. Our hope is that these requests will allow you access to exceptional care and in a timely manner. _______________________________________________________________  If you have questions after your visit, please contact our office at [phone] between the hours of 8:30 a.m. and 5:00 p.m. Voicemails left after 4:30 p.m. will not be returned until the following business day. _______________________________________________________________  For prescription refill requests, have your pharmacy contact our office. _______________________________________________________________  Recommendations made by the consultant and any test results will be sent to your referring physician. _______________________________________________________________ 

## 2019-05-19 NOTE — Progress Notes (Signed)
Hydration fluids today per orders.   Patient tolerated it well without problems. Vitals stable and discharged home from clinic ambulatory. Follow up as scheduled.

## 2019-05-25 ENCOUNTER — Inpatient Hospital Stay (HOSPITAL_COMMUNITY): Payer: Medicare Other

## 2019-05-25 ENCOUNTER — Encounter (HOSPITAL_COMMUNITY): Payer: Self-pay | Admitting: Hematology

## 2019-05-25 ENCOUNTER — Other Ambulatory Visit: Payer: Self-pay

## 2019-05-25 ENCOUNTER — Inpatient Hospital Stay (HOSPITAL_BASED_OUTPATIENT_CLINIC_OR_DEPARTMENT_OTHER): Payer: Medicare Other | Admitting: Hematology

## 2019-05-25 VITALS — BP 147/57 | HR 73 | Temp 98.2°F | Resp 18

## 2019-05-25 DIAGNOSIS — F419 Anxiety disorder, unspecified: Secondary | ICD-10-CM

## 2019-05-25 DIAGNOSIS — D649 Anemia, unspecified: Secondary | ICD-10-CM

## 2019-05-25 DIAGNOSIS — F17210 Nicotine dependence, cigarettes, uncomplicated: Secondary | ICD-10-CM | POA: Diagnosis not present

## 2019-05-25 DIAGNOSIS — Z5111 Encounter for antineoplastic chemotherapy: Secondary | ICD-10-CM | POA: Diagnosis not present

## 2019-05-25 DIAGNOSIS — Z79899 Other long term (current) drug therapy: Secondary | ICD-10-CM

## 2019-05-25 DIAGNOSIS — F329 Major depressive disorder, single episode, unspecified: Secondary | ICD-10-CM

## 2019-05-25 DIAGNOSIS — C787 Secondary malignant neoplasm of liver and intrahepatic bile duct: Secondary | ICD-10-CM | POA: Diagnosis not present

## 2019-05-25 DIAGNOSIS — I10 Essential (primary) hypertension: Secondary | ICD-10-CM

## 2019-05-25 DIAGNOSIS — C221 Intrahepatic bile duct carcinoma: Secondary | ICD-10-CM

## 2019-05-25 LAB — CBC WITH DIFFERENTIAL/PLATELET
Abs Immature Granulocytes: 0.06 10*3/uL (ref 0.00–0.07)
Basophils Absolute: 0 10*3/uL (ref 0.0–0.1)
Basophils Relative: 1 %
Eosinophils Absolute: 0.1 10*3/uL (ref 0.0–0.5)
Eosinophils Relative: 1 %
HCT: 26 % — ABNORMAL LOW (ref 36.0–46.0)
Hemoglobin: 8.8 g/dL — ABNORMAL LOW (ref 12.0–15.0)
Immature Granulocytes: 1 %
Lymphocytes Relative: 39 %
Lymphs Abs: 2.5 10*3/uL (ref 0.7–4.0)
MCH: 34.5 pg — ABNORMAL HIGH (ref 26.0–34.0)
MCHC: 33.8 g/dL (ref 30.0–36.0)
MCV: 102 fL — ABNORMAL HIGH (ref 80.0–100.0)
Monocytes Absolute: 0.3 10*3/uL (ref 0.1–1.0)
Monocytes Relative: 5 %
Neutro Abs: 3.4 10*3/uL (ref 1.7–7.7)
Neutrophils Relative %: 53 %
Platelets: 239 10*3/uL (ref 150–400)
RBC: 2.55 MIL/uL — ABNORMAL LOW (ref 3.87–5.11)
RDW: 16.8 % — ABNORMAL HIGH (ref 11.5–15.5)
WBC: 6.4 10*3/uL (ref 4.0–10.5)
nRBC: 0 % (ref 0.0–0.2)

## 2019-05-25 LAB — COMPREHENSIVE METABOLIC PANEL
ALT: 11 U/L (ref 0–44)
AST: 17 U/L (ref 15–41)
Albumin: 3.6 g/dL (ref 3.5–5.0)
Alkaline Phosphatase: 141 U/L — ABNORMAL HIGH (ref 38–126)
Anion gap: 10 (ref 5–15)
BUN: 18 mg/dL (ref 8–23)
CO2: 25 mmol/L (ref 22–32)
Calcium: 9.3 mg/dL (ref 8.9–10.3)
Chloride: 101 mmol/L (ref 98–111)
Creatinine, Ser: 1.22 mg/dL — ABNORMAL HIGH (ref 0.44–1.00)
GFR calc Af Amer: 54 mL/min — ABNORMAL LOW (ref >60)
GFR calc non Af Amer: 47 mL/min — ABNORMAL LOW (ref >60)
Glucose, Bld: 106 mg/dL — ABNORMAL HIGH (ref 70–99)
Potassium: 3.9 mmol/L (ref 3.5–5.1)
Sodium: 136 mmol/L (ref 135–145)
Total Bilirubin: 0.5 mg/dL (ref 0.3–1.2)
Total Protein: 6.9 g/dL (ref 6.5–8.1)

## 2019-05-25 LAB — LACTATE DEHYDROGENASE: LDH: 168 U/L (ref 98–192)

## 2019-05-25 LAB — MAGNESIUM: Magnesium: 1.8 mg/dL (ref 1.7–2.4)

## 2019-05-25 MED ORDER — POTASSIUM CHLORIDE 2 MEQ/ML IV SOLN
Freq: Once | INTRAVENOUS | Status: AC
  Administered 2019-05-25: 08:00:00 via INTRAVENOUS
  Filled 2019-05-25: qty 10

## 2019-05-25 MED ORDER — SODIUM CHLORIDE 0.9 % IV SOLN
2000.0000 mg | Freq: Once | INTRAVENOUS | Status: AC
  Administered 2019-05-25: 2000 mg via INTRAVENOUS
  Filled 2019-05-25: qty 52.6

## 2019-05-25 MED ORDER — SODIUM CHLORIDE 0.9 % IV SOLN
25.0000 mg/m2 | Freq: Once | INTRAVENOUS | Status: AC
  Administered 2019-05-25: 52 mg via INTRAVENOUS
  Filled 2019-05-25: qty 52

## 2019-05-25 MED ORDER — PALONOSETRON HCL INJECTION 0.25 MG/5ML
0.2500 mg | Freq: Once | INTRAVENOUS | Status: AC
  Administered 2019-05-25: 10:00:00 0.25 mg via INTRAVENOUS
  Filled 2019-05-25: qty 5

## 2019-05-25 MED ORDER — HEPARIN SOD (PORK) LOCK FLUSH 100 UNIT/ML IV SOLN
500.0000 [IU] | Freq: Once | INTRAVENOUS | Status: AC | PRN
  Administered 2019-05-25: 500 [IU]

## 2019-05-25 MED ORDER — SODIUM CHLORIDE 0.9% FLUSH
10.0000 mL | INTRAVENOUS | Status: DC | PRN
  Administered 2019-05-25: 10 mL
  Filled 2019-05-25: qty 10

## 2019-05-25 MED ORDER — SODIUM CHLORIDE 0.9 % IV SOLN
Freq: Once | INTRAVENOUS | Status: AC
  Administered 2019-05-25: 08:00:00 via INTRAVENOUS

## 2019-05-25 MED ORDER — SODIUM CHLORIDE 0.9 % IV SOLN
Freq: Once | INTRAVENOUS | Status: AC
  Administered 2019-05-25: 11:00:00 via INTRAVENOUS
  Filled 2019-05-25: qty 5

## 2019-05-25 NOTE — Progress Notes (Signed)
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[place])   02/22/2019 -  Chemotherapy   The patient had palonosetron (ALOXI) injection 0.25 mg, 0.25 mg, Intravenous,  Once, 5 of 6 cycles Administration: 0.25 mg (02/22/2019), 0.25 mg (03/01/2019), 0.25 mg (03/15/2019), 0.25 mg (03/22/2019), 0.25 mg (04/06/2019), 0.25 mg (04/13/2019), 0.25 mg (04/27/2019), 0.25 mg (05/04/2019), 0.25 mg (05/18/2019), 0.25 mg (05/25/2019) pegfilgrastim-cbqv (UDENYCA) injection 6 mg, 6 mg, Subcutaneous, Once, 5 of 6 cycles Administration: 6 mg (03/02/2019), 6 mg (03/24/2019), 6 mg (05/05/2019) CISplatin (PLATINOL) 52 mg in sodium chloride 0.9 % 250 mL chemo infusion, 25 mg/m2 = 52 mg, Intravenous,  Once, 5 of 6 cycles Administration: 52 mg (02/22/2019), 52 mg (03/01/2019), 52 mg (03/15/2019), 52 mg (03/22/2019), 52 mg (04/06/2019), 52 mg (04/13/2019), 52 mg (04/27/2019), 52 mg (05/04/2019), 52 mg (05/18/2019), 52 mg (05/25/2019) gemcitabine (GEMZAR) 2,000 mg in sodium chloride 0.9 % 250 mL chemo infusion, 2,090 mg, Intravenous,  Once, 5 of 6 cycles Dose modification: 750 mg/m2 (75 % of original dose 1,000 mg/m2, Cycle 4, Reason: Other (see comments), Comment: neutropenia) Administration: 2,000 mg (02/22/2019), 2,000 mg (03/01/2019), 2,000 mg (03/15/2019), 2,000 mg (03/22/2019), 2,000 mg (04/06/2019), 2,000 mg (04/13/2019), 2,000 mg (04/27/2019), 1,558 mg (05/04/2019), 2,000 mg (05/18/2019), 2,000 mg (05/25/2019) fosaprepitant (EMEND) 150 mg, dexamethasone (DECADRON) 12 mg in sodium chloride 0.9 % 145 mL IVPB, , Intravenous,  Once,  5 of 6 cycles Administration:  (02/22/2019),  (03/01/2019),  (03/15/2019),  (03/22/2019),  (04/06/2019),  (04/13/2019),  (04/27/2019),  (05/04/2019),  (05/18/2019),  (05/25/2019)  for chemotherapy treatment.       CANCER STAGING: Cancer Staging No matching staging information was found for the patient.   INTERVAL HISTORY:  Ms. Lackey 64 y.o. female here for follow-up and day 8 of cycle 5 chemotherapy.  She received day 1 of cycle 5 on 05/18/2019.  Denied any nausea vomiting diarrhea or constipation.  Energy and appetite levels are 100%.  No pain reported.  Denies any fevers or night sweats.  Denies any chills.  Denies any tingling or numbness in extremities.  No issues with hearing or ringing in the ears.     REVIEW OF SYSTEMS:  Review of Systems  Constitutional: Negative for fatigue.  Gastrointestinal: Negative for constipation.  All other systems reviewed and are negative.    PAST MEDICAL/SURGICAL HISTORY:  Past Medical History:  Diagnosis Date  . Anxiety   . Arthritis   . Depression   . Hypertension    Past Surgical History:  Procedure Laterality Date  . COLONOSCOPY N/A 10/23/2016   Procedure: COLONOSCOPY;  Surgeon: Danie Binder, MD;  Location: [facility];  Service: Endoscopy;  Laterality: N/A;  11:30 Am  . POLYPECTOMY  10/23/2016   Procedure: POLYPECTOMY;  Surgeon: Danie Binder, MD;  Location: [facility];  Service: Endoscopy;;  sigmoid colon polyp  . PORTACATH PLACEMENT Left 02/16/2019   Procedure: INSERTION PORT-A-CATH (attached catheter in left subclavian);  Surgeon: Virl Cagey, MD;  Location: [facility]S;  Service: General;  Laterality: Left;  . RT HIP SURGERY    . TOTAL HIP ARTHROPLASTY Left 04/07/2016  Procedure: LEFT TOTAL HIP ARTHROPLASTY ANTERIOR APPROACH;  Surgeon: Paralee Cancel, MD;  Location: [facility];  Service: Orthopedics;  Laterality: Left;  Unsuccessful for Spinal, went to General     SOCIAL HISTORY:  Social History   Socioeconomic History  . Marital  status: Single    Spouse name: Not on file  . Number of children: Not on file  . Years of education: Not on file  . Highest education level: Not on file  Occupational History  . Not on file  Social Needs  . Financial resource strain: Not hard at all  . Food insecurity    Worry: Never true    Inability: Never true  . Transportation needs    Medical: No    Non-medical: No  Tobacco Use  . Smoking status: Current Every Day Smoker    Packs/day: 0.25    Years: 42.00    Pack years: 10.50  . Smokeless tobacco: Never Used  Substance and Sexual Activity  . Alcohol use: No  . Drug use: No  . Sexual activity: Not Currently  Lifestyle  . Physical activity    Days per week: 0 days    Minutes per session: 0 min  . Stress: Only a little  Relationships  . Social Herbalist on phone: Three times a week    Gets together: Three times a week    Attends religious service: More than 4 times per year    Active member of club or organization: Yes    Attends meetings of clubs or organizations: More than 4 times per year    Relationship status: Never married  . Intimate partner violence    Fear of current or ex partner: No    Emotionally abused: No    Physically abused: No    Forced sexual activity: No  Other Topics Concern  . Not on file  Social History Narrative  . Not on file    FAMILY HISTORY:  Family History  Problem Relation Age of Onset  . Hypertension Mother   . Cancer Father   . Hypertension Brother   . Cancer Brother   . Hypertension Sister   . Cancer Sister     CURRENT MEDICATIONS:  Outpatient Encounter Medications as of 05/25/2019  Medication Sig  . amLODIPine-Valsartan-HCTZ 10-320-25 MG TABS Take 1 tablet by mouth daily.  Marland Kitchen atorvastatin (LIPITOR) 20 MG tablet   . CISPLATIN IV Inject into the vein. Day 1, 8 every 21 days  . doxazosin (CARDURA) 2 MG tablet Take 2 mg by mouth every evening.   Marland Kitchen GEMCITABINE HCL IV Inject into the vein. Day 1, 8 every 21 days   . HYDROcodone-acetaminophen (NORCO) 5-325 MG tablet Take 1 tablet by mouth every 4 (four) hours as needed for moderate pain.  Marland Kitchen lidocaine (XYLOCAINE) 2 % solution Swish and swallow 1 tablespoon four times a day prn sore mouth  . lidocaine-prilocaine (EMLA) cream Apply to skin over port a cath one hour prior to chemotherapy appointment  . naproxen sodium (ALEVE) 220 MG tablet Take 220 mg by mouth 2 (two) times daily as needed.   . potassium chloride SA (K-DUR) 20 MEQ tablet Take 1 tablet (20 mEq total) by mouth 2 (two) times daily.  . prochlorperazine (COMPAZINE) 10 MG tablet Take 1 tablet (10 mg total) by mouth every 6 (six) hours as needed (Nausea or vomiting).  . vitamin B-12 (CYANOCOBALAMIN) 50 MCG tablet Take 50 mcg by mouth daily.  . [EXPIRED] 0.9 %  sodium chloride infusion   . [EXPIRED] dextrose 5 % and 0.45% NaCl 1,000 mL with potassium chloride 20 mEq, magnesium sulfate 12 mEq infusion   . [EXPIRED] heparin lock flush 100 unit/mL   . [DISCONTINUED] sodium chloride flush (NS) 0.9 % injection 10 mL    No facility-administered encounter medications on file as of 05/25/2019.     ALLERGIES:  No Known Allergies   PHYSICAL EXAM:  ECOG Performance status: 1  Vitals:   05/25/19 0754  BP: (!) 165/65  Pulse: 72  Resp: 18  Temp: 97.9 F (36.6 C)  SpO2: 98%   Filed Weights   05/25/19 0754  Weight: 208 lb (94.3 kg)    Physical Exam Vitals signs reviewed.  Constitutional:      Appearance: Normal appearance.  Cardiovascular:     Rate and Rhythm: Normal rate and regular rhythm.     Heart sounds: Normal heart sounds.  Pulmonary:     Effort: Pulmonary effort is normal.     Breath sounds: Normal breath sounds.  Abdominal:     General: There is no distension.     Palpations: Abdomen is soft. There is no mass.  Musculoskeletal:        General: No swelling.  Skin:    General: Skin is warm.  Neurological:     General: No focal deficit present.     Mental Status: She is alert  and oriented to person, place, and time.  Psychiatric:        Mood and Affect: Mood normal.        Behavior: Behavior normal.      LABORATORY DATA:  I have reviewed the labs as listed.  CBC    Component Value Date/Time   WBC 6.4 05/25/2019 0751   RBC 2.55 (L) 05/25/2019 0751   HGB 8.8 (L) 05/25/2019 0751   HCT 26.0 (L) 05/25/2019 0751   PLT 239 05/25/2019 0751   MCV 102.0 (H) 05/25/2019 0751   MCH 34.5 (H) 05/25/2019 0751   MCHC 33.8 05/25/2019 0751   RDW 16.8 (H) 05/25/2019 0751   LYMPHSABS 2.5 05/25/2019 0751   MONOABS 0.3 05/25/2019 0751   EOSABS 0.1 05/25/2019 0751   BASOSABS 0.0 05/25/2019 0751   CMP Latest Ref Rng & Units 05/25/2019 05/18/2019 05/04/2019  Glucose 70 - 99 mg/dL 106(H) 107(H) 108(H)  BUN 8 - 23 mg/dL 18 13 16   Creatinine 0.44 - 1.00 mg/dL 1.22(H) 1.27(H) 1.08(H)  Sodium 135 - 145 mmol/L 136 139 139  Potassium 3.5 - 5.1 mmol/L 3.9 3.1(L) 3.4(L)  Chloride 98 - 111 mmol/L 101 103 100  CO2 22 - 32 mmol/L 25 28 26   Calcium 8.9 - 10.3 mg/dL 9.3 9.4 9.4  Total Protein 6.5 - 8.1 g/dL 6.9 6.8 7.2  Total Bilirubin 0.3 - 1.2 mg/dL 0.5 0.5 0.8  Alkaline Phos 38 - 126 U/L 141(H) 171(H) 215(H)  AST 15 - 41 U/L 17 14(L) 20  ALT 0 - 44 U/L 11 9 17        DIAGNOSTIC IMAGING:  I have independently reviewed the scans and discussed with the patient.   I have reviewed Venita Lick LPN's note and agree with the documentation.  I personally performed a face-to-face visit, made revisions and my assessment and plan is as follows.    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  Metastatic cholangiocarcinoma: - PET CT scan on 01/17/2019 shows hepatic hypermetabolism corresponding to gallbladder fundus with SUV of 9.1.  Focus of central  right hepatic lobe hypermetabolism likely corresponds to the site of intrahepatic ductal obstruction on the prior diagnostic CT with SUV of 8.3.  Hypermetabolism corresponding to the porta hepatis presumed nodal  mass measuring 3.3 cm. - Core biopsy of the right liver mass consistent with well-differentiated adenocarcinoma, positive for CK7 and CDX 2.  CK20 and TTF-1 are negative.  Differential diagnosis includes an upper GI/pancreaticobiliary primary. -CT CAP after 3 cycles on 04/24/2019 (compared to PET scan from 01/17/2019) did not show significant change in the index lesions in the liver or enlarged porta hepatic lymph nodes. - 5 cycles of gemcitabine and cisplatin from 02/22/2019 through 05/18/2019. -CEA improved from 44-16.3 on 04/27/2019. -She tolerated her last cycle very well.  She may proceed with her day 8 of cycle 5 today. - I will see her back in 2 weeks for follow-up and cycle 6.  2.  Normocytic anemia: -This is chemotherapy-induced.  Hemoglobin is 8.8.         Total time spent is 25 minutes with more than 50% of the time spent face-to-face discussing treatment plan and coordination of care.     Orders placed this encounter:  Orders Placed This Encounter  Procedures  . CEA  . Cancer antigen 19-9  . CBC with Differential/Platelet  . Comprehensive metabolic panel      Derek Jack, MD [place] [phone]

## 2019-05-25 NOTE — Progress Notes (Signed)
Patient to treatment room for oncology follow up and chemotherapy.  Denied symptoms.  Denied ringing in ears.  No s/s of distress noted.   Ser Creat 1.2 with lab review and oncology follow up with Dr. Delton Coombes.  Verbal order ok to treat today and to return tomorrow for hydration per Dr. Delton Coombes.   Patient tolerated chemotherapy with no complaints voiced.  Port site clean and dry with no bruising or swelling noted at site.  Good blood return noted before and after administration of chemotherapy.  Port needle left in for hydration tomorrow.  Dressing reinforced with no complaints of pain at site.  Patient left ambulatory with VSS and no s/s of distress noted.

## 2019-05-25 NOTE — Patient Instructions (Signed)
[facility] at [facility] Discharge Instructions  You were seen today by Dr. Katragadda. He went over your recent lab results. He will see you back in 2 weeks for labs, treatment and follow up.   Thank you for choosing  [facility] at [facility] to provide your oncology and hematology care.  To afford each patient quality time with our provider, please arrive at least 15 minutes before your scheduled appointment time.   If you have a lab appointment with the Cancer Center please come in thru the  Main Entrance and check in at the main information desk  You need to re-schedule your appointment should you arrive 10 or more minutes late.  We strive to give you quality time with our providers, and arriving late affects you and other patients whose appointments are after yours.  Also, if you no show three or more times for appointments you may be dismissed from the clinic at the providers discretion.     Again, thank you for choosing [facility].  Our hope is that these requests will decrease the amount of time that you wait before being seen by our physicians.       _____________________________________________________________  Should you have questions after your visit to [facility], please contact our office at [phone] between the hours of 8:00 a.m. and 4:30 p.m.  Voicemails left after 4:00 p.m. will not be returned until the following business day.  For prescription refill requests, have your pharmacy contact our office and allow 72 hours.    Cancer Center Support Programs:   > Cancer Support Group  2nd Tuesday of the month 1pm-2pm, Journey Room    

## 2019-05-25 NOTE — Assessment & Plan Note (Addendum)
1.  Metastatic cholangiocarcinoma: - PET CT scan on 01/17/2019 shows hepatic hypermetabolism corresponding to gallbladder fundus with SUV of 9.1.  Focus of central right hepatic lobe hypermetabolism likely corresponds to the site of intrahepatic ductal obstruction on the prior diagnostic CT with SUV of 8.3.  Hypermetabolism corresponding to the porta hepatis presumed nodal mass measuring 3.3 cm. - Core biopsy of the right liver mass consistent with well-differentiated adenocarcinoma, positive for CK7 and CDX 2.  CK20 and TTF-1 are negative.  Differential diagnosis includes an upper GI/pancreaticobiliary primary. -CT CAP after 3 cycles on 04/24/2019 (compared to PET scan from 01/17/2019) did not show significant change in the index lesions in the liver or enlarged porta hepatic lymph nodes. - 5 cycles of gemcitabine and cisplatin from 02/22/2019 through 05/18/2019. -CEA improved from 44-16.3 on 04/27/2019. -She tolerated her last cycle very well.  She may proceed with her day 8 of cycle 5 today. - I will see her back in 2 weeks for follow-up and cycle 6.  2.  Normocytic anemia: -This is chemotherapy-induced.  Hemoglobin is 8.8.

## 2019-05-26 ENCOUNTER — Inpatient Hospital Stay (HOSPITAL_COMMUNITY): Payer: Medicare Other

## 2019-05-26 VITALS — BP 152/44 | HR 65 | Temp 98.0°F | Resp 18

## 2019-05-26 DIAGNOSIS — C787 Secondary malignant neoplasm of liver and intrahepatic bile duct: Secondary | ICD-10-CM

## 2019-05-26 DIAGNOSIS — C221 Intrahepatic bile duct carcinoma: Secondary | ICD-10-CM

## 2019-05-26 DIAGNOSIS — Z5111 Encounter for antineoplastic chemotherapy: Secondary | ICD-10-CM | POA: Diagnosis not present

## 2019-05-26 LAB — CEA: CEA: 12.3 ng/mL — ABNORMAL HIGH (ref 0.0–4.7)

## 2019-05-26 MED ORDER — SODIUM CHLORIDE 0.9 % IV SOLN
Freq: Once | INTRAVENOUS | Status: AC
  Administered 2019-05-26: 12:00:00 via INTRAVENOUS
  Filled 2019-05-26: qty 1000

## 2019-05-26 MED ORDER — SODIUM CHLORIDE 0.9% FLUSH
10.0000 mL | Freq: Once | INTRAVENOUS | Status: AC | PRN
  Administered 2019-05-26: 10 mL

## 2019-05-26 MED ORDER — PEGFILGRASTIM-CBQV 6 MG/0.6ML ~~LOC~~ SOSY
PREFILLED_SYRINGE | SUBCUTANEOUS | Status: AC
  Filled 2019-05-26: qty 0.6

## 2019-05-26 MED ORDER — HEPARIN SOD (PORK) LOCK FLUSH 100 UNIT/ML IV SOLN
500.0000 [IU] | Freq: Once | INTRAVENOUS | Status: AC | PRN
  Administered 2019-05-26: 500 [IU]

## 2019-05-26 MED ORDER — PEGFILGRASTIM-CBQV 6 MG/0.6ML ~~LOC~~ SOSY
6.0000 mg | PREFILLED_SYRINGE | Freq: Once | SUBCUTANEOUS | Status: AC
  Administered 2019-05-26: 6 mg via SUBCUTANEOUS

## 2019-05-26 NOTE — Patient Instructions (Signed)
[facility] at [facility]  Discharge Instructions:   _______________________________________________________________  Thank you for choosing [facility] at [facility] to provide your oncology and hematology care.  To afford each patient quality time with our providers, please arrive at least 15 minutes before your scheduled appointment.  You need to re-schedule your appointment if you arrive 10 or more minutes late.  We strive to give you quality time with our providers, and arriving late affects you and other patients whose appointments are after yours.  Also, if you no show three or more times for appointments you may be dismissed from the clinic.  Again, thank you for choosing [facility] at [facility]. Our hope is that these requests will allow you access to exceptional care and in a timely manner. _______________________________________________________________  If you have questions after your visit, please contact our office at [phone] between the hours of 8:30 a.m. and 5:00 p.m. Voicemails left after 4:30 p.m. will not be returned until the following business day. _______________________________________________________________  For prescription refill requests, have your pharmacy contact our office. _______________________________________________________________  Recommendations made by the consultant and any test results will be sent to your referring physician. _______________________________________________________________ 

## 2019-05-26 NOTE — Progress Notes (Signed)
Pt presents today for IV hydration and Udenyca injection. VSS. Pt has no complaints of pain today. Pt states, " I had some night sweats last night." Pt denies any cramping of her legs. MAR reviewed and updated.   IV hydration given today per MD orders. Tolerated infusion without adverse affects. Vital signs stable. No complaints at this time. Discharged from clinic ambulatory. F/U with [facility] as scheduled.

## 2019-05-29 ENCOUNTER — Other Ambulatory Visit (HOSPITAL_COMMUNITY): Payer: Self-pay | Admitting: Hematology

## 2019-05-29 DIAGNOSIS — E876 Hypokalemia: Secondary | ICD-10-CM

## 2019-05-31 ENCOUNTER — Other Ambulatory Visit (HOSPITAL_COMMUNITY): Payer: Self-pay | Admitting: Hematology

## 2019-05-31 DIAGNOSIS — E876 Hypokalemia: Secondary | ICD-10-CM

## 2019-06-05 ENCOUNTER — Inpatient Hospital Stay (HOSPITAL_BASED_OUTPATIENT_CLINIC_OR_DEPARTMENT_OTHER): Payer: Medicare Other | Admitting: Nurse Practitioner

## 2019-06-05 ENCOUNTER — Other Ambulatory Visit: Payer: Self-pay

## 2019-06-05 ENCOUNTER — Telehealth (HOSPITAL_COMMUNITY): Payer: Self-pay | Admitting: *Deleted

## 2019-06-05 VITALS — BP 168/50 | HR 73 | Temp 98.4°F | Resp 18 | Wt 203.2 lb

## 2019-06-05 DIAGNOSIS — C787 Secondary malignant neoplasm of liver and intrahepatic bile duct: Secondary | ICD-10-CM

## 2019-06-05 DIAGNOSIS — I10 Essential (primary) hypertension: Secondary | ICD-10-CM

## 2019-06-05 DIAGNOSIS — R112 Nausea with vomiting, unspecified: Secondary | ICD-10-CM

## 2019-06-05 DIAGNOSIS — F329 Major depressive disorder, single episode, unspecified: Secondary | ICD-10-CM

## 2019-06-05 DIAGNOSIS — C221 Intrahepatic bile duct carcinoma: Secondary | ICD-10-CM | POA: Diagnosis not present

## 2019-06-05 DIAGNOSIS — F419 Anxiety disorder, unspecified: Secondary | ICD-10-CM

## 2019-06-05 DIAGNOSIS — Z79899 Other long term (current) drug therapy: Secondary | ICD-10-CM

## 2019-06-05 DIAGNOSIS — D649 Anemia, unspecified: Secondary | ICD-10-CM | POA: Diagnosis not present

## 2019-06-05 DIAGNOSIS — Z5111 Encounter for antineoplastic chemotherapy: Secondary | ICD-10-CM | POA: Diagnosis not present

## 2019-06-05 DIAGNOSIS — F17210 Nicotine dependence, cigarettes, uncomplicated: Secondary | ICD-10-CM

## 2019-06-05 MED ORDER — PROCHLORPERAZINE MALEATE 10 MG PO TABS: 10.0000 mg | ORAL_TABLET | Freq: Four times a day (QID) | ORAL | 1 refills | Status: DC | PRN

## 2019-06-05 NOTE — Progress Notes (Signed)
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[place])   02/22/2019 -  Chemotherapy   The patient had palonosetron (ALOXI) injection 0.25 mg, 0.25 mg, Intravenous,  Once, 5 of 6 cycles Administration: 0.25 mg (02/22/2019), 0.25 mg (03/01/2019), 0.25 mg (03/15/2019), 0.25 mg (03/22/2019), 0.25 mg (04/06/2019), 0.25 mg (04/13/2019), 0.25 mg (04/27/2019), 0.25 mg (05/04/2019), 0.25 mg (05/18/2019), 0.25 mg (05/25/2019) pegfilgrastim-cbqv (UDENYCA) injection 6 mg, 6 mg, Subcutaneous, Once, 5 of 6 cycles Administration: 6 mg (03/02/2019), 6 mg (03/24/2019), 6 mg (05/05/2019), 6 mg (05/26/2019) CISplatin (PLATINOL) 52 mg in sodium chloride 0.9 % 250 mL chemo infusion, 25 mg/m2 = 52 mg, Intravenous,  Once, 5 of 6 cycles Administration: 52 mg (02/22/2019), 52 mg (03/01/2019), 52 mg (03/15/2019), 52 mg (03/22/2019), 52 mg (04/06/2019), 52 mg (04/13/2019), 52 mg (04/27/2019), 52 mg (05/04/2019), 52 mg (05/18/2019), 52 mg (05/25/2019) gemcitabine (GEMZAR) 2,000 mg in sodium chloride 0.9 % 250 mL chemo infusion, 2,090 mg, Intravenous,  Once, 5 of 6 cycles Dose modification: 750 mg/m2 (75 % of original dose 1,000 mg/m2, Cycle 4, Reason: Other (see comments), Comment: neutropenia) Administration: 2,000 mg (02/22/2019), 2,000 mg (03/01/2019), 2,000 mg (03/15/2019), 2,000 mg (03/22/2019), 2,000 mg (04/06/2019), 2,000 mg (04/13/2019), 2,000 mg (04/27/2019), 1,558 mg (05/04/2019), 2,000 mg (05/18/2019), 2,000 mg (05/25/2019) fosaprepitant (EMEND) 150 mg, dexamethasone (DECADRON) 12 mg in sodium chloride 0.9 % 145 mL IVPB, , Intravenous,  Once, 5  of 6 cycles Administration:  (02/22/2019),  (03/01/2019),  (03/15/2019),  (03/22/2019),  (04/06/2019),  (04/13/2019),  (04/27/2019),  (05/04/2019),  (05/18/2019),  (05/25/2019)  for chemotherapy treatment.       INTERVAL HISTORY:  Ms. Beaudry 63 y.o. female returns for routine follow-up for nausea and vomiting related to chemotherapy.  She reports she ran out of her Compazine and has been having problems keeping medication and food down since.  Her appetite has decreased since starting chemotherapy she was wanting other options besides milk products for drink supplementations. Denies any nausea, vomiting, or diarrhea. Denies any new pains. Had not noticed any recent bleeding such as epistaxis, hematuria or hematochezia. Denies recent chest pain on exertion, shortness of breath on minimal exertion, pre-syncopal episodes, or palpitations. Denies any numbness or tingling in hands or feet. Denies any recent fevers, infections, or recent hospitalizations. Patient reports appetite at 0% and energy level at 25%.     REVIEW OF SYSTEMS:  Review of Systems  Gastrointestinal: Positive for constipation, nausea and vomiting.  All other systems reviewed and are negative.    PAST MEDICAL/SURGICAL HISTORY:  Past Medical History:  Diagnosis Date   Anxiety    Arthritis    Depression    Hypertension    Past Surgical History:  Procedure Laterality Date   COLONOSCOPY N/A 10/23/2016   Procedure: COLONOSCOPY;  Surgeon: Danie Binder, MD;  Location: [facility];  Service: Endoscopy;  Laterality: N/A;  11:30 Am   POLYPECTOMY  10/23/2016   Procedure: POLYPECTOMY;  Surgeon: Danie Binder, MD;  Location: [facility];  Service: Endoscopy;;  sigmoid colon polyp   PORTACATH PLACEMENT Left 02/16/2019   Procedure: INSERTION PORT-A-CATH (attached catheter in left  subclavian);  Surgeon: Virl Cagey, MD;  Location: [facility];  Service: General;  Laterality: Left;   RT HIP SURGERY     TOTAL HIP ARTHROPLASTY  Left 04/07/2016   Procedure: LEFT TOTAL HIP ARTHROPLASTY ANTERIOR APPROACH;  Surgeon: Paralee Cancel, MD;  Location: [facility];  Service: Orthopedics;  Laterality: Left;  Unsuccessful for Spinal, went to General     SOCIAL HISTORY:  Social History   Socioeconomic History   Marital status: Single    Spouse name: Not on file   Number of children: Not on file   Years of education: Not on file   Highest education level: Not on file  Occupational History   Not on file  Social Needs   Financial resource strain: Not hard at all   Food insecurity    Worry: Never true    Inability: Never true   Transportation needs    Medical: No    Non-medical: No  Tobacco Use   Smoking status: Current Every Day Smoker    Packs/day: 0.25    Years: 42.00    Pack years: 10.50   Smokeless tobacco: Never Used  Substance and Sexual Activity   Alcohol use: No   Drug use: No   Sexual activity: Not Currently  Lifestyle   Physical activity    Days per week: 0 days    Minutes per session: 0 min   Stress: Only a little  Relationships   Social connections    Talks on phone: Three times a week    Gets together: Three times a week    Attends religious service: More than 4 times per year    Active member of club or organization: Yes    Attends meetings of clubs or organizations: More than 4 times per year    Relationship status: Never married   Intimate partner violence    Fear of current or ex partner: No    Emotionally abused: No    Physically abused: No    Forced sexual activity: No  Other Topics Concern   Not on file  Social History Narrative   Not on file    FAMILY HISTORY:  Family History  Problem Relation Age of Onset   Hypertension Mother    Cancer Father    Hypertension Brother    Cancer Brother    Hypertension Sister    Cancer Sister     CURRENT MEDICATIONS:  Outpatient Encounter Medications as of 06/05/2019  Medication Sig   amLODIPine-Valsartan-HCTZ  10-320-25 MG TABS Take 1 tablet by mouth daily.   CISPLATIN IV Inject into the vein. Day 1, 8 every 21 days   GEMCITABINE HCL IV Inject into the vein. Day 1, 8 every 21 days   KLOR-CON M20 20 MEQ tablet TAKE 1 TABLET BY MOUTH TWICE A DAY   lidocaine (XYLOCAINE) 2 % solution Swish and swallow 1 tablespoon four times a day prn sore mouth   lidocaine-prilocaine (EMLA) cream Apply to skin over port a cath one hour prior to chemotherapy appointment   naproxen sodium (ALEVE) 220 MG tablet Take 220 mg by mouth 2 (two) times daily as needed.    vitamin B-12 (CYANOCOBALAMIN) 50 MCG tablet Take 50 mcg by mouth daily.   [DISCONTINUED] KLOR-CON M20 20 MEQ tablet    [DISCONTINUED] prochlorperazine (COMPAZINE) 10 MG tablet Take 1 tablet (10 mg total) by mouth every 6 (six) hours as needed (Nausea or vomiting).   atorvastatin (LIPITOR) 20 MG tablet    doxazosin (CARDURA) 2  MG tablet Take 2 mg by mouth every evening.    HYDROcodone-acetaminophen (NORCO) 5-325 MG tablet Take 1 tablet by mouth every 4 (four) hours as needed for moderate pain. (Patient not taking: Reported on 06/05/2019)   prochlorperazine (COMPAZINE) 10 MG tablet Take 1 tablet (10 mg total) by mouth every 6 (six) hours as needed (Nausea or vomiting).   No facility-administered encounter medications on file as of 06/05/2019.     ALLERGIES:  No Known Allergies   PHYSICAL EXAM:  ECOG Performance status: 1  Vitals:   06/05/19 1310  BP: (!) 168/50  Pulse: 73  Resp: 18  Temp: 98.4 F (36.9 C)  SpO2: 100%   Filed Weights   06/05/19 1310  Weight: 203 lb 3.2 oz (92.2 kg)    Physical Exam Constitutional:      Appearance: Normal appearance. She is normal weight.  Cardiovascular:     Rate and Rhythm: Normal rate and regular rhythm.     Heart sounds: Normal heart sounds.  Pulmonary:     Effort: Pulmonary effort is normal.     Breath sounds: Normal breath sounds.  Abdominal:     General: Bowel sounds are normal.      Palpations: Abdomen is soft.  Musculoskeletal: Normal range of motion.  Skin:    General: Skin is warm and dry.  Neurological:     Mental Status: She is alert and oriented to person, place, and time. Mental status is at baseline.  Psychiatric:        Mood and Affect: Mood normal.        Behavior: Behavior normal.        Thought Content: Thought content normal.        Judgment: Judgment normal.      LABORATORY DATA:  I have reviewed the labs as listed.  CBC    Component Value Date/Time   WBC 6.4 05/25/2019 0751   RBC 2.55 (L) 05/25/2019 0751   HGB 8.8 (L) 05/25/2019 0751   HCT 26.0 (L) 05/25/2019 0751   PLT 239 05/25/2019 0751   MCV 102.0 (H) 05/25/2019 0751   MCH 34.5 (H) 05/25/2019 0751   MCHC 33.8 05/25/2019 0751   RDW 16.8 (H) 05/25/2019 0751   LYMPHSABS 2.5 05/25/2019 0751   MONOABS 0.3 05/25/2019 0751   EOSABS 0.1 05/25/2019 0751   BASOSABS 0.0 05/25/2019 0751   CMP Latest Ref Rng & Units 05/25/2019 05/18/2019 05/04/2019  Glucose 70 - 99 mg/dL 106(H) 107(H) 108(H)  BUN 8 - 23 mg/dL 18 13 16   Creatinine 0.44 - 1.00 mg/dL 1.22(H) 1.27(H) 1.08(H)  Sodium 135 - 145 mmol/L 136 139 139  Potassium 3.5 - 5.1 mmol/L 3.9 3.1(L) 3.4(L)  Chloride 98 - 111 mmol/L 101 103 100  CO2 22 - 32 mmol/L 25 28 26   Calcium 8.9 - 10.3 mg/dL 9.3 9.4 9.4  Total Protein 6.5 - 8.1 g/dL 6.9 6.8 7.2  Total Bilirubin 0.3 - 1.2 mg/dL 0.5 0.5 0.8  Alkaline Phos 38 - 126 U/L 141(H) 171(H) 215(H)  AST 15 - 41 U/L 17 14(L) 20  ALT 0 - 44 U/L 11 9 17     I personally performed a face-to-face visit.  All questions were answered to patient's stated satisfaction. Encouraged patient to call with any new concerns or questions before his next visit to the cancer center and we can certain see him sooner, if needed.     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  Metastatic cholangiocarcinoma: -  PET CT scan on 01/17/2019 shows hepatic hypermetabolism corresponding to  gallbladder fundus with SUV of 9.1.  Focus of central right hepatic lobe hypermetabolism likely corresponds to the site of intrahepatic ductal obstruction on the prior diagnostic CT with SUV of 8.3.  Hypermetabolism corresponding to the porta hepatis presumed nodal mass measuring 3.3 cm. - Core biopsy of the right liver mass consistent with well-differentiated adenocarcinoma, positive for CK7 and CDX 2.  CK20 and TTF-1 are negative.  Differential diagnosis includes an upper GI/pancreaticobiliary primary. -CT CAP after 3 cycles on 04/24/2019 (compared to PET scan from 01/17/2019) did not show significant change in the index lesions in the liver or enlarged porta hepatic lymph nodes. - 5 cycles of gemcitabine and cisplatin from 02/22/2019 through 05/18/2019. -CEA improved from 44-16.3 on 04/27/2019. -She tolerated her last cycle very well.  She may proceed with her day 8 of cycle 5 today. - Patient called today wanting an appointment for nausea, vomiting, and decreased appetite. - Patient reports she ran out of her Compazine and was unable to keep anything down.  Compazine order refilled sent to [facility] in [place]. - She was wanting other ideas for increased protein and calories other than milk products.  We discussed other options like fruit shakes with protein powder. - We also discussed Remeron however she wanted to try the shakes first.  We can refer her to the dietitian if the Compazine and shakes do not work. - She will follow-up with Dr. Delton Coombes for treatment and labs on Thursday.  2.  Normocytic anemia: -This is chemotherapy-induced.  Hemoglobin is 8.8.              Orders placed this encounter:  No orders of the defined types were placed in this encounter.     Francene Finders, FNP-C [place] [phone]

## 2019-06-05 NOTE — Assessment & Plan Note (Signed)
1.  Metastatic cholangiocarcinoma: - PET CT scan on 01/17/2019 shows hepatic hypermetabolism corresponding to gallbladder fundus with SUV of 9.1.  Focus of central right hepatic lobe hypermetabolism likely corresponds to the site of intrahepatic ductal obstruction on the prior diagnostic CT with SUV of 8.3.  Hypermetabolism corresponding to the porta hepatis presumed nodal mass measuring 3.3 cm. - Core biopsy of the right liver mass consistent with well-differentiated adenocarcinoma, positive for CK7 and CDX 2.  CK20 and TTF-1 are negative.  Differential diagnosis includes an upper GI/pancreaticobiliary primary. -CT CAP after 3 cycles on 04/24/2019 (compared to PET scan from 01/17/2019) did not show significant change in the index lesions in the liver or enlarged porta hepatic lymph nodes. - 5 cycles of gemcitabine and cisplatin from 02/22/2019 through 05/18/2019. -CEA improved from 44-16.3 on 04/27/2019. -She tolerated her last cycle very well.  She may proceed with her day 8 of cycle 5 today. - Patient called today wanting an appointment for nausea, vomiting, and decreased appetite. - Patient reports she ran out of her Compazine and was unable to keep anything down.  [facility] [place]. - She was wanting other ideas for increased protein and calories other than milk products.  We discussed other options like fruit shakes with protein powder. - We also discussed Remeron however she wanted to try the shakes first.  We can refer her to the dietitian if the Compazine and shakes do not work. - She will follow-up with Dr. Delton Coombes for treatment and labs on Thursday.  2.  Normocytic anemia: -This is chemotherapy-induced.  Hemoglobin is 8.8.

## 2019-06-05 NOTE — Telephone Encounter (Signed)
Returned Pt's call. Pt states that she is having a hard time swallowing her nausea and blood pressure medications. She states that if she gets them down she throws them right back up. She states that she has tried cutting the pills in half with no luck. She states that she also has no appetite. She was given an appointment at 1:00 to see one of our NP's.

## 2019-06-08 ENCOUNTER — Inpatient Hospital Stay (HOSPITAL_COMMUNITY): Payer: Medicare Other | Attending: Hematology

## 2019-06-08 ENCOUNTER — Other Ambulatory Visit: Payer: Self-pay

## 2019-06-08 ENCOUNTER — Encounter (HOSPITAL_COMMUNITY): Payer: Self-pay | Admitting: Hematology

## 2019-06-08 ENCOUNTER — Inpatient Hospital Stay (HOSPITAL_BASED_OUTPATIENT_CLINIC_OR_DEPARTMENT_OTHER): Payer: Medicare Other | Admitting: Hematology

## 2019-06-08 ENCOUNTER — Inpatient Hospital Stay (HOSPITAL_COMMUNITY): Payer: Medicare Other

## 2019-06-08 VITALS — BP 152/59 | HR 77 | Temp 96.9°F | Resp 18 | Wt 204.4 lb

## 2019-06-08 VITALS — BP 160/58 | HR 70 | Temp 97.7°F | Resp 18

## 2019-06-08 DIAGNOSIS — R1310 Dysphagia, unspecified: Secondary | ICD-10-CM | POA: Insufficient documentation

## 2019-06-08 DIAGNOSIS — K5900 Constipation, unspecified: Secondary | ICD-10-CM | POA: Diagnosis not present

## 2019-06-08 DIAGNOSIS — R112 Nausea with vomiting, unspecified: Secondary | ICD-10-CM | POA: Diagnosis not present

## 2019-06-08 DIAGNOSIS — E876 Hypokalemia: Secondary | ICD-10-CM | POA: Insufficient documentation

## 2019-06-08 DIAGNOSIS — E878 Other disorders of electrolyte and fluid balance, not elsewhere classified: Secondary | ICD-10-CM | POA: Insufficient documentation

## 2019-06-08 DIAGNOSIS — C221 Intrahepatic bile duct carcinoma: Secondary | ICD-10-CM | POA: Insufficient documentation

## 2019-06-08 DIAGNOSIS — E785 Hyperlipidemia, unspecified: Secondary | ICD-10-CM | POA: Insufficient documentation

## 2019-06-08 DIAGNOSIS — D649 Anemia, unspecified: Secondary | ICD-10-CM | POA: Diagnosis not present

## 2019-06-08 DIAGNOSIS — R5383 Other fatigue: Secondary | ICD-10-CM | POA: Insufficient documentation

## 2019-06-08 DIAGNOSIS — Z5111 Encounter for antineoplastic chemotherapy: Secondary | ICD-10-CM | POA: Diagnosis present

## 2019-06-08 DIAGNOSIS — F17210 Nicotine dependence, cigarettes, uncomplicated: Secondary | ICD-10-CM | POA: Diagnosis not present

## 2019-06-08 DIAGNOSIS — I10 Essential (primary) hypertension: Secondary | ICD-10-CM | POA: Insufficient documentation

## 2019-06-08 DIAGNOSIS — Z79899 Other long term (current) drug therapy: Secondary | ICD-10-CM | POA: Diagnosis not present

## 2019-06-08 DIAGNOSIS — C787 Secondary malignant neoplasm of liver and intrahepatic bile duct: Secondary | ICD-10-CM

## 2019-06-08 LAB — COMPREHENSIVE METABOLIC PANEL
ALT: 8 U/L (ref 0–44)
AST: 16 U/L (ref 15–41)
Albumin: 3.7 g/dL (ref 3.5–5.0)
Alkaline Phosphatase: 121 U/L (ref 38–126)
Anion gap: 14 (ref 5–15)
BUN: 13 mg/dL (ref 8–23)
CO2: 25 mmol/L (ref 22–32)
Calcium: 9.4 mg/dL (ref 8.9–10.3)
Chloride: 99 mmol/L (ref 98–111)
Creatinine, Ser: 1.56 mg/dL — ABNORMAL HIGH (ref 0.44–1.00)
GFR calc Af Amer: 40 mL/min — ABNORMAL LOW (ref >60)
GFR calc non Af Amer: 35 mL/min — ABNORMAL LOW (ref >60)
Glucose, Bld: 110 mg/dL — ABNORMAL HIGH (ref 70–99)
Potassium: 3.2 mmol/L — ABNORMAL LOW (ref 3.5–5.1)
Sodium: 138 mmol/L (ref 135–145)
Total Bilirubin: 0.4 mg/dL (ref 0.3–1.2)
Total Protein: 6.8 g/dL (ref 6.5–8.1)

## 2019-06-08 LAB — CBC WITH DIFFERENTIAL/PLATELET
Abs Immature Granulocytes: 0.22 10*3/uL — ABNORMAL HIGH (ref 0.00–0.07)
Basophils Absolute: 0 10*3/uL (ref 0.0–0.1)
Basophils Relative: 0 %
Eosinophils Absolute: 0.3 10*3/uL (ref 0.0–0.5)
Eosinophils Relative: 3 %
HCT: 25.1 % — ABNORMAL LOW (ref 36.0–46.0)
Hemoglobin: 8.1 g/dL — ABNORMAL LOW (ref 12.0–15.0)
Immature Granulocytes: 2 %
Lymphocytes Relative: 28 %
Lymphs Abs: 2.8 10*3/uL (ref 0.7–4.0)
MCH: 33.8 pg (ref 26.0–34.0)
MCHC: 32.3 g/dL (ref 30.0–36.0)
MCV: 104.6 fL — ABNORMAL HIGH (ref 80.0–100.0)
Monocytes Absolute: 1 10*3/uL (ref 0.1–1.0)
Monocytes Relative: 10 %
Neutro Abs: 5.9 10*3/uL (ref 1.7–7.7)
Neutrophils Relative %: 57 %
Platelets: 196 10*3/uL (ref 150–400)
RBC: 2.4 MIL/uL — ABNORMAL LOW (ref 3.87–5.11)
RDW: 17.6 % — ABNORMAL HIGH (ref 11.5–15.5)
WBC: 10.2 10*3/uL (ref 4.0–10.5)
nRBC: 0.4 % — ABNORMAL HIGH (ref 0.0–0.2)

## 2019-06-08 LAB — LACTATE DEHYDROGENASE: LDH: 186 U/L (ref 98–192)

## 2019-06-08 LAB — MAGNESIUM: Magnesium: 1.6 mg/dL — ABNORMAL LOW (ref 1.7–2.4)

## 2019-06-08 MED ORDER — SODIUM CHLORIDE 0.9% FLUSH
10.0000 mL | INTRAVENOUS | Status: DC | PRN
  Administered 2019-06-08 (×2): 10 mL
  Filled 2019-06-08 (×2): qty 10

## 2019-06-08 MED ORDER — POTASSIUM CHLORIDE 2 MEQ/ML IV SOLN
Freq: Once | INTRAVENOUS | Status: AC
  Administered 2019-06-08: 09:00:00 via INTRAVENOUS
  Filled 2019-06-08: qty 10

## 2019-06-08 MED ORDER — POTASSIUM CHLORIDE ER 10 MEQ PO TBCR: 20.0000 meq | EXTENDED_RELEASE_TABLET | Freq: Two times a day (BID) | ORAL | 6 refills | Status: DC

## 2019-06-08 MED ORDER — SODIUM CHLORIDE 0.9 % IV SOLN
Freq: Once | INTRAVENOUS | Status: AC
  Administered 2019-06-08: 09:00:00 via INTRAVENOUS

## 2019-06-08 MED ORDER — HEPARIN SOD (PORK) LOCK FLUSH 100 UNIT/ML IV SOLN
500.0000 [IU] | Freq: Once | INTRAVENOUS | Status: AC | PRN
  Administered 2019-06-08: 500 [IU]

## 2019-06-08 MED ORDER — SODIUM CHLORIDE 0.9 % IV SOLN: Freq: Once | INTRAVENOUS | Status: DC

## 2019-06-08 NOTE — Progress Notes (Signed)
Patient to treatment room for oncology follow up and chemotherapy.  Patient denied ringing in ears and neuropathy.  Only complains of difficulty swallowing pills and fatigue.  Dr. Delton Coombes notified for visit.  No s/s of distress noted.    Patient to receive hydration fluids today and Monday, and return next Thursday on regular appointment for chemotherapy.  Treatment held today due to elevated serum creatinine verbal order Dr. Delton Coombes.  Pharmacy notified.    Patient tolerated hydration with no complaints voiced.  Port site clean and dry with good blood return noted.  Band aid applied.  VSS with discharge and left ambulatory with no s/s of distress noted.

## 2019-06-08 NOTE — Progress Notes (Addendum)
[place], [place] [address]   CLINIC:  Medical Oncology/Hematology  PCP:  Cory Munch, PA-C [place] [address] [phone]   REASON FOR VISIT: Follow-up for nausea and vomiting  CURRENT THERAPY: Cisplatin and gemcitabine.  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[place])   02/22/2019 -  Chemotherapy   The patient had palonosetron (ALOXI) injection 0.25 mg, 0.25 mg, Intravenous,  Once, 6 of 6 cycles Administration: 0.25 mg (02/22/2019), 0.25 mg (03/01/2019), 0.25 mg (03/15/2019), 0.25 mg (03/22/2019), 0.25 mg (04/06/2019), 0.25 mg (04/13/2019), 0.25 mg (04/27/2019), 0.25 mg (05/04/2019), 0.25 mg (05/18/2019), 0.25 mg (05/25/2019) pegfilgrastim-cbqv (UDENYCA) injection 6 mg, 6 mg, Subcutaneous, Once, 6 of 6 cycles Administration: 6 mg (03/02/2019), 6 mg (03/24/2019), 6 mg (05/05/2019), 6 mg (05/26/2019) CISplatin (PLATINOL) 52 mg in sodium chloride 0.9 % 250 mL chemo infusion, 25 mg/m2 = 52 mg, Intravenous,  Once, 6 of 6 cycles Administration: 52 mg (02/22/2019), 52 mg (03/01/2019), 52 mg (03/15/2019), 52 mg (03/22/2019), 52 mg (04/06/2019), 52 mg (04/13/2019), 52 mg (04/27/2019), 52 mg (05/04/2019), 52 mg (05/18/2019), 52 mg (05/25/2019) gemcitabine (GEMZAR) 2,000 mg in sodium chloride 0.9 % 250 mL chemo infusion, 2,090 mg, Intravenous,  Once, 6 of 6 cycles Dose modification: 750 mg/m2 (75 % of original dose 1,000 mg/m2, Cycle 4, Reason: Other (see comments), Comment: neutropenia) Administration: 2,000 mg (02/22/2019), 2,000 mg (03/01/2019), 2,000 mg (03/15/2019), 2,000 mg (03/22/2019), 2,000 mg (04/06/2019), 2,000 mg (04/13/2019), 2,000 mg (04/27/2019), 1,558 mg (05/04/2019), 2,000 mg (05/18/2019), 2,000 mg (05/25/2019) fosaprepitant (EMEND) 150 mg, dexamethasone (DECADRON) 12 mg in sodium chloride 0.9 % 145 mL IVPB, , Intravenous,   Once, 6 of 6 cycles Administration:  (02/22/2019),  (03/01/2019),  (03/15/2019),  (03/22/2019),  (04/06/2019),  (04/13/2019),  (04/27/2019),  (05/04/2019),  (05/18/2019),  (05/25/2019)  for chemotherapy treatment.       INTERVAL HISTORY:  Ms. Sorrels 63 y.o. female seen for follow-up and next cycle of chemotherapy.  She received cycle 5 on 05/18/2019.  She does report some constipation today.  Appetite and energy levels are 75%.  Denies any tingling or numbness in extremities.  Denies any ringing in the ears or hearing loss.  Has some constipation which is manageable.  Denies any fevers or infections.  She is having hard time swallowing tablets, particularly potassium.     REVIEW OF SYSTEMS:  Review of Systems  Gastrointestinal: Positive for constipation.  All other systems reviewed and are negative.    PAST MEDICAL/SURGICAL HISTORY:  Past Medical History:  Diagnosis Date  . Anxiety   . Arthritis   . Depression   . Hypertension    Past Surgical History:  Procedure Laterality Date  . COLONOSCOPY N/A 10/23/2016   Procedure: COLONOSCOPY;  Surgeon: Danie Binder, MD;  Location: [facility];  Service: Endoscopy;  Laterality: N/A;  11:30 Am  . POLYPECTOMY  10/23/2016   Procedure: POLYPECTOMY;  Surgeon: Danie Binder, MD;  Location: [facility];  Service: Endoscopy;;  sigmoid colon polyp  . PORTACATH PLACEMENT Left 02/16/2019   Procedure: INSERTION PORT-A-CATH (attached catheter in left subclavian);  Surgeon: Virl Cagey, MD;  Location: [facility] ORS;  Service: General;  Laterality: Left;  . RT HIP SURGERY    . TOTAL HIP ARTHROPLASTY Left 04/07/2016   Procedure: LEFT TOTAL HIP ARTHROPLASTY ANTERIOR APPROACH;  Surgeon: Paralee Cancel, MD;  Location: [facility];  Service: Orthopedics;  Laterality: Left;  Unsuccessful for Spinal, went to General     SOCIAL HISTORY:  Social History   Socioeconomic History  . Marital status: Single    Spouse name: Not on file  . Number of children: Not on file  .  Years of education: Not on file  . Highest education level: Not on file  Occupational History  . Not on file  Social Needs  . Financial resource strain: Not hard at all  . Food insecurity    Worry: Never true    Inability: Never true  . Transportation needs    Medical: No    Non-medical: No  Tobacco Use  . Smoking status: Current Every Day Smoker    Packs/day: 0.25    Years: 42.00    Pack years: 10.50  . Smokeless tobacco: Never Used  Substance and Sexual Activity  . Alcohol use: No  . Drug use: No  . Sexual activity: Not Currently  Lifestyle  . Physical activity    Days per week: 0 days    Minutes per session: 0 min  . Stress: Only a little  Relationships  . Social Herbalist on phone: Three times a week    Gets together: Three times a week    Attends religious service: More than 4 times per year    Active member of club or organization: Yes    Attends meetings of clubs or organizations: More than 4 times per year    Relationship status: Never married  . Intimate partner violence    Fear of current or ex partner: No    Emotionally abused: No    Physically abused: No    Forced sexual activity: No  Other Topics Concern  . Not on file  Social History Narrative  . Not on file    FAMILY HISTORY:  Family History  Problem Relation Age of Onset  . Hypertension Mother   . Cancer Father   . Hypertension Brother   . Cancer Brother   . Hypertension Sister   . Cancer Sister     CURRENT MEDICATIONS:  Outpatient Encounter Medications as of 06/08/2019  Medication Sig  . amLODIPine-Valsartan-HCTZ 10-320-25 MG TABS Take 1 tablet by mouth daily.  Marland Kitchen atorvastatin (LIPITOR) 20 MG tablet   . CISPLATIN IV Inject into the vein. Day 1, 8 every 21 days  . doxazosin (CARDURA) 2 MG tablet Take 2 mg by mouth every evening.   Marland Kitchen GEMCITABINE HCL IV Inject into the vein. Day 1, 8 every 21 days  . HYDROcodone-acetaminophen (NORCO) 5-325 MG tablet Take 1 tablet by mouth every 4  (four) hours as needed for moderate pain.  Marland Kitchen KLOR-CON M20 20 MEQ tablet TAKE 1 TABLET BY MOUTH TWICE A DAY  . lidocaine (XYLOCAINE) 2 % solution Swish and swallow 1 tablespoon four times a day prn sore mouth  . lidocaine-prilocaine (EMLA) cream Apply to skin over port a cath one hour prior to chemotherapy appointment  . naproxen sodium (ALEVE) 220 MG tablet Take 220 mg by mouth 2 (two) times daily as needed.   . potassium chloride (K-DUR) 10 MEQ tablet Take 2 tablets (20 mEq total) by mouth 2 (two) times daily.  . prochlorperazine (COMPAZINE) 10 MG tablet Take 1 tablet (10 mg total) by mouth every 6 (six) hours as needed (Nausea or vomiting).  . vitamin B-12 (CYANOCOBALAMIN) 50 MCG tablet Take 50 mcg by mouth daily.   No facility-administered encounter medications on file as  of 06/08/2019.     ALLERGIES:  No Known Allergies   PHYSICAL EXAM:  ECOG Performance status: 1  Vitals:   06/08/19 0757  BP: (!) 152/59  Pulse: 77  Resp: 18  Temp: (!) 96.9 F (36.1 C)  SpO2: 100%   Filed Weights   06/08/19 0757  Weight: 204 lb 6.4 oz (92.7 kg)    Physical Exam Constitutional:      Appearance: Normal appearance. She is normal weight.  Cardiovascular:     Rate and Rhythm: Normal rate and regular rhythm.     Heart sounds: Normal heart sounds.  Pulmonary:     Effort: Pulmonary effort is normal.     Breath sounds: Normal breath sounds.  Abdominal:     General: Bowel sounds are normal.     Palpations: Abdomen is soft.  Musculoskeletal: Normal range of motion.  Skin:    General: Skin is warm and dry.  Neurological:     Mental Status: She is alert and oriented to person, place, and time. Mental status is at baseline.  Psychiatric:        Mood and Affect: Mood normal.        Behavior: Behavior normal.        Thought Content: Thought content normal.        Judgment: Judgment normal.      LABORATORY DATA:  I have reviewed the labs as listed.  CBC    Component Value Date/Time    WBC 10.2 06/08/2019 0759   RBC 2.40 (L) 06/08/2019 0759   HGB 8.1 (L) 06/08/2019 0759   HCT 25.1 (L) 06/08/2019 0759   PLT 196 06/08/2019 0759   MCV 104.6 (H) 06/08/2019 0759   MCH 33.8 06/08/2019 0759   MCHC 32.3 06/08/2019 0759   RDW 17.6 (H) 06/08/2019 0759   LYMPHSABS 2.8 06/08/2019 0759   MONOABS 1.0 06/08/2019 0759   EOSABS 0.3 06/08/2019 0759   BASOSABS 0.0 06/08/2019 0759   CMP Latest Ref Rng & Units 06/08/2019 05/25/2019 05/18/2019  Glucose 70 - 99 mg/dL 110(H) 106(H) 107(H)  BUN 8 - 23 mg/dL 13 18 13   Creatinine 0.44 - 1.00 mg/dL 1.56(H) 1.22(H) 1.27(H)  Sodium 135 - 145 mmol/L 138 136 139  Potassium 3.5 - 5.1 mmol/L 3.2(L) 3.9 3.1(L)  Chloride 98 - 111 mmol/L 99 101 103  CO2 22 - 32 mmol/L 25 25 28   Calcium 8.9 - 10.3 mg/dL 9.4 9.3 9.4  Total Protein 6.5 - 8.1 g/dL 6.8 6.9 6.8  Total Bilirubin 0.3 - 1.2 mg/dL 0.4 0.5 0.5  Alkaline Phos 38 - 126 U/L 121 141(H) 171(H)  AST 15 - 41 U/L 16 17 14(L)  ALT 0 - 44 U/L 8 11 9       All questions were answered to patient's stated satisfaction. Encouraged patient to call with any new concerns or questions before his next visit to the cancer center and we can certain see him sooner, if needed.     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  Metastatic cholangiocarcinoma: - PET scan on 01/17/2019 shows hepatic hypermetabolism corresponding to gallbladder fundus with SUV of 9.1.  Focus of central right hepatic lobe hypermetabolic and likely corresponds to the site of intrahepatic duct obstruction on the prior diagnostic CT with SUV of 8.3.  Hypermetabolism corresponding to the porta hepatis presumed nodal mass measuring 3.3 cm. -Core biopsy of the right liver mass consistent with well-differentiated adenocarcinoma, positive for CK7 and CDX 2.  CK20 and  TTF-1 are negative.  Differential diagnosis includes upper GI/pancreaticobiliary primary. - 5 cycles of gemcitabine and cisplatin from 02/22/2019 through  05/18/2019. - CEA improved from 44-12 point 4 cycles. - CT CAP after 3 cycles on 04/24/2019 (compared to PET scan from 01/17/2019) did not show significant change in the index lesions in the liver or enlarged porta hepatis lymph nodes. - We have reviewed her blood work.  Creatinine increased to 1.56 today. -I will hold off for chemotherapy.  We will give her 1 L of hydration with electrolytes followed by 500 mL of normal saline. -We will schedule her for some hydration on Monday. -I will reevaluate her on Thursday with repeat blood labs.  2.  Electrolyte abnormalities: -Potassium is 3.2 today and magnesium is 1.6. -She is having difficulty swallowing potassium tablets.  We will send potassium 10 mEq tablets which are smaller in size. -Magnesium is repleted intravenously today.  3.  Normocytic anemia: -This is from myelosuppressive chemotherapy.  No transfusion needed.   Total time spent is 25 minutes with more than 50% of the time spent face-to-face discussing treatment plan, counseling and coordination of care.  Orders placed this encounter:  Orders Placed This Encounter  Procedures  . CBC with Differential/Platelet  . Comprehensive metabolic panel  . Magnesium  . Lactate dehydrogenase  . CEA      Derek Jack, MD [place] [phone]

## 2019-06-08 NOTE — Patient Instructions (Addendum)
[facility] at [facility] Discharge Instructions  You were seen today by Dr. Katragadda. He went over your recent lab results. He will see you back in 1 week for labs and follow up.   Thank you for choosing [facility] at [facility] to provide your oncology and hematology care.  To afford each patient quality time with our provider, please arrive at least 15 minutes before your scheduled appointment time.   If you have a lab appointment with the Cancer Center please come in thru the  Main Entrance and check in at the main information desk  You need to re-schedule your appointment should you arrive 10 or more minutes late.  We strive to give you quality time with our providers, and arriving late affects you and other patients whose appointments are after yours.  Also, if you no show three or more times for appointments you may be dismissed from the clinic at the providers discretion.     Again, thank you for choosing [facility].  Our hope is that these requests will decrease the amount of time that you wait before being seen by our physicians.       _____________________________________________________________  Should you have questions after your visit to [facility], please contact our office at [phone] between the hours of 8:00 a.m. and 4:30 p.m.  Voicemails left after 4:00 p.m. will not be returned until the following business day.  For prescription refill requests, have your pharmacy contact our office and allow 72 hours.    Cancer Center Support Programs:   > Cancer Support Group  2nd Tuesday of the month 1pm-2pm, Journey Room    

## 2019-06-08 NOTE — Assessment & Plan Note (Addendum)
1.  Metastatic cholangiocarcinoma: - PET scan on 01/17/2019 shows hepatic hypermetabolism corresponding to gallbladder fundus with SUV of 9.1.  Focus of central right hepatic lobe hypermetabolic and likely corresponds to the site of intrahepatic duct obstruction on the prior diagnostic CT with SUV of 8.3.  Hypermetabolism corresponding to the porta hepatis presumed nodal mass measuring 3.3 cm. -Core biopsy of the right liver mass consistent with well-differentiated adenocarcinoma, positive for CK7 and CDX 2.  CK20 and TTF-1 are negative.  Differential diagnosis includes upper GI/pancreaticobiliary primary. - 5 cycles of gemcitabine and cisplatin from 02/22/2019 through 05/18/2019. - CEA improved from 44-12 point 4 cycles. - CT CAP after 3 cycles on 04/24/2019 (compared to PET scan from 01/17/2019) did not show significant change in the index lesions in the liver or enlarged porta hepatis lymph nodes. - We have reviewed her blood work.  Creatinine increased to 1.56 today. -I will hold off for chemotherapy.  We will give her 1 L of hydration with electrolytes followed by 500 mL of normal saline. -We will schedule her for some hydration on Monday. -I will reevaluate her on Thursday with repeat blood labs.  2.  Electrolyte abnormalities: -Potassium is 3.2 today and magnesium is 1.6. -She is having difficulty swallowing potassium tablets.  We will send potassium 10 mEq tablets which are smaller in size. -Magnesium is repleted intravenously today.  3.  Normocytic anemia: -This is from myelosuppressive chemotherapy.  No transfusion needed.

## 2019-06-12 ENCOUNTER — Encounter (HOSPITAL_COMMUNITY): Payer: Self-pay

## 2019-06-12 ENCOUNTER — Other Ambulatory Visit: Payer: Self-pay

## 2019-06-12 ENCOUNTER — Inpatient Hospital Stay (HOSPITAL_COMMUNITY): Payer: Medicare Other

## 2019-06-12 VITALS — BP 154/64 | HR 59 | Temp 97.7°F | Resp 18

## 2019-06-12 DIAGNOSIS — C787 Secondary malignant neoplasm of liver and intrahepatic bile duct: Secondary | ICD-10-CM

## 2019-06-12 DIAGNOSIS — C221 Intrahepatic bile duct carcinoma: Secondary | ICD-10-CM

## 2019-06-12 MED ORDER — SODIUM CHLORIDE 0.9% FLUSH
10.0000 mL | Freq: Once | INTRAVENOUS | Status: AC | PRN
  Administered 2019-06-12: 10 mL

## 2019-06-12 MED ORDER — HEPARIN SOD (PORK) LOCK FLUSH 100 UNIT/ML IV SOLN
500.0000 [IU] | Freq: Once | INTRAVENOUS | Status: AC | PRN
  Administered 2019-06-12: 500 [IU]

## 2019-06-12 MED ORDER — SODIUM CHLORIDE 0.9 % IV SOLN
Freq: Once | INTRAVENOUS | Status: AC
  Administered 2019-06-12: 09:00:00 via INTRAVENOUS
  Filled 2019-06-12: qty 1000

## 2019-06-12 NOTE — Progress Notes (Signed)
Nicole Ibarra tolerated IV hydration with potassium and magnesium well without complaints or incident. VSS upon discharge. Pt discharged self ambulatory in satisfactory condition

## 2019-06-12 NOTE — Patient Instructions (Signed)
[facility] at [facility] Discharge Instructions Received IV hydration with potassium and magnesium today. Follow-up as scheduled. Call clinic for any questions or concerns   Thank you for choosing [facility] at [facility] to provide your oncology and hematology care.  To afford each patient quality time with our provider, please arrive at least 15 minutes before your scheduled appointment time.   If you have a lab appointment with the Cancer Center please come in thru the  Main Entrance and check in at the main information desk  You need to re-schedule your appointment should you arrive 10 or more minutes late.  We strive to give you quality time with our providers, and arriving late affects you and other patients whose appointments are after yours.  Also, if you no show three or more times for appointments you may be dismissed from the clinic at the providers discretion.     Again, thank you for choosing [facility].  Our hope is that these requests will decrease the amount of time that you wait before being seen by our physicians.       _____________________________________________________________  Should you have questions after your visit to [facility], please contact our office at [phone] between the hours of 8:00 a.m. and 4:30 p.m.  Voicemails left after 4:00 p.m. will not be returned until the following business day.  For prescription refill requests, have your pharmacy contact our office and allow 72 hours.    Cancer Center Support Programs:   > Cancer Support Group  2nd Tuesday of the month 1pm-2pm, Journey Room   

## 2019-06-15 ENCOUNTER — Inpatient Hospital Stay (HOSPITAL_BASED_OUTPATIENT_CLINIC_OR_DEPARTMENT_OTHER): Payer: Medicare Other | Admitting: Hematology

## 2019-06-15 ENCOUNTER — Encounter (HOSPITAL_COMMUNITY): Payer: Self-pay

## 2019-06-15 ENCOUNTER — Other Ambulatory Visit: Payer: Self-pay

## 2019-06-15 ENCOUNTER — Inpatient Hospital Stay (HOSPITAL_COMMUNITY): Payer: Medicare Other

## 2019-06-15 ENCOUNTER — Encounter (HOSPITAL_COMMUNITY): Payer: Self-pay | Admitting: Hematology

## 2019-06-15 VITALS — BP 140/53 | HR 62 | Temp 97.9°F | Resp 18

## 2019-06-15 DIAGNOSIS — C221 Intrahepatic bile duct carcinoma: Secondary | ICD-10-CM

## 2019-06-15 DIAGNOSIS — C787 Secondary malignant neoplasm of liver and intrahepatic bile duct: Secondary | ICD-10-CM

## 2019-06-15 DIAGNOSIS — E876 Hypokalemia: Secondary | ICD-10-CM

## 2019-06-15 DIAGNOSIS — R5383 Other fatigue: Secondary | ICD-10-CM

## 2019-06-15 DIAGNOSIS — E785 Hyperlipidemia, unspecified: Secondary | ICD-10-CM

## 2019-06-15 DIAGNOSIS — D649 Anemia, unspecified: Secondary | ICD-10-CM

## 2019-06-15 DIAGNOSIS — F17210 Nicotine dependence, cigarettes, uncomplicated: Secondary | ICD-10-CM

## 2019-06-15 DIAGNOSIS — Z79899 Other long term (current) drug therapy: Secondary | ICD-10-CM

## 2019-06-15 DIAGNOSIS — I10 Essential (primary) hypertension: Secondary | ICD-10-CM

## 2019-06-15 DIAGNOSIS — R112 Nausea with vomiting, unspecified: Secondary | ICD-10-CM | POA: Diagnosis not present

## 2019-06-15 LAB — COMPREHENSIVE METABOLIC PANEL WITH GFR
ALT: 8 U/L (ref 0–44)
AST: 13 U/L — ABNORMAL LOW (ref 15–41)
Albumin: 3.8 g/dL (ref 3.5–5.0)
Alkaline Phosphatase: 101 U/L (ref 38–126)
Anion gap: 10 (ref 5–15)
BUN: 22 mg/dL (ref 8–23)
CO2: 24 mmol/L (ref 22–32)
Calcium: 9.3 mg/dL (ref 8.9–10.3)
Chloride: 102 mmol/L (ref 98–111)
Creatinine, Ser: 1.45 mg/dL — ABNORMAL HIGH (ref 0.44–1.00)
GFR calc Af Amer: 44 mL/min — ABNORMAL LOW
GFR calc non Af Amer: 38 mL/min — ABNORMAL LOW
Glucose, Bld: 102 mg/dL — ABNORMAL HIGH (ref 70–99)
Potassium: 3.7 mmol/L (ref 3.5–5.1)
Sodium: 136 mmol/L (ref 135–145)
Total Bilirubin: 0.3 mg/dL (ref 0.3–1.2)
Total Protein: 7.2 g/dL (ref 6.5–8.1)

## 2019-06-15 LAB — CBC WITH DIFFERENTIAL/PLATELET
Abs Immature Granulocytes: 0.06 10*3/uL (ref 0.00–0.07)
Basophils Absolute: 0.1 10*3/uL (ref 0.0–0.1)
Basophils Relative: 1 %
Eosinophils Absolute: 0.2 10*3/uL (ref 0.0–0.5)
Eosinophils Relative: 2 %
HCT: 25.9 % — ABNORMAL LOW (ref 36.0–46.0)
Hemoglobin: 8.6 g/dL — ABNORMAL LOW (ref 12.0–15.0)
Immature Granulocytes: 1 %
Lymphocytes Relative: 30 %
Lymphs Abs: 2.4 10*3/uL (ref 0.7–4.0)
MCH: 34.7 pg — ABNORMAL HIGH (ref 26.0–34.0)
MCHC: 33.2 g/dL (ref 30.0–36.0)
MCV: 104.4 fL — ABNORMAL HIGH (ref 80.0–100.0)
Monocytes Absolute: 0.9 10*3/uL (ref 0.1–1.0)
Monocytes Relative: 12 %
Neutro Abs: 4.4 10*3/uL (ref 1.7–7.7)
Neutrophils Relative %: 54 %
Platelets: 257 10*3/uL (ref 150–400)
RBC: 2.48 MIL/uL — ABNORMAL LOW (ref 3.87–5.11)
RDW: 17 % — ABNORMAL HIGH (ref 11.5–15.5)
WBC: 8 10*3/uL (ref 4.0–10.5)
nRBC: 0 % (ref 0.0–0.2)

## 2019-06-15 LAB — LACTATE DEHYDROGENASE: LDH: 184 U/L (ref 98–192)

## 2019-06-15 LAB — MAGNESIUM: Magnesium: 1.8 mg/dL (ref 1.7–2.4)

## 2019-06-15 MED ORDER — SODIUM CHLORIDE 0.9 % IV SOLN
INTRAVENOUS | Status: AC
  Administered 2019-06-15: 11:00:00 via INTRAVENOUS

## 2019-06-15 MED ORDER — SODIUM CHLORIDE 0.9% FLUSH
10.0000 mL | INTRAVENOUS | Status: DC | PRN
  Administered 2019-06-15: 10 mL
  Filled 2019-06-15: qty 10

## 2019-06-15 MED ORDER — HEPARIN SOD (PORK) LOCK FLUSH 100 UNIT/ML IV SOLN
500.0000 [IU] | Freq: Once | INTRAVENOUS | Status: AC | PRN
  Administered 2019-06-15: 500 [IU]

## 2019-06-15 MED ORDER — SODIUM CHLORIDE 0.9 % IV SOLN
Freq: Once | INTRAVENOUS | Status: AC
  Administered 2019-06-15: 08:00:00 via INTRAVENOUS

## 2019-06-15 MED ORDER — POTASSIUM CHLORIDE 2 MEQ/ML IV SOLN
Freq: Once | INTRAVENOUS | Status: AC
  Administered 2019-06-15: 09:00:00 via INTRAVENOUS
  Filled 2019-06-15: qty 10

## 2019-06-15 NOTE — Progress Notes (Signed)
0850 Labs reviewed with and pt seen by Dr. Delton Coombes and chemo tx to be held today but IV hydration 1 liter with magnesium and potassium over 2 hours as well as NS 500 ml IV over 1 hour to be given today per MD                             [name] tolerated IV hydration with magnesium and potassium well without complaints or incident. Port left accessed,saline locked and flushed for use tomorrow. VSS upon discharge. Pt discharged self ambulatory in satisfactory condition

## 2019-06-15 NOTE — Patient Instructions (Addendum)
[place] at [facility] Discharge Instructions  You were seen today by Dr. Delton Coombes. He went over your recent lab results. He will see you back tomorrow for labs and follow up.  Please start drinking four bottles of water a day to help flush your kidneys.  Thank you for choosing [place] at [facility] to provide your oncology and hematology care.  To afford each patient quality time with our provider, please arrive at least 15 minutes before your scheduled appointment time.   If you have a lab appointment with the [facility] please come in thru the  Main Entrance and check in at the main information desk  You need to re-schedule your appointment should you arrive 10 or more minutes late.  We strive to give you quality time with our providers, and arriving late affects you and other patients whose appointments are after yours.  Also, if you no show three or more times for appointments you may be dismissed from the clinic at the providers discretion.     Again, thank you for choosing [facility].  Our hope is that these requests will decrease the amount of time that you wait before being seen by our physicians.       _____________________________________________________________  Should you have questions after your visit to [facility], please contact our office at [phone] between the hours of 8:00 a.m. and 4:30 p.m.  Voicemails left after 4:00 p.m. will not be returned until the following business day.  For prescription refill requests, have your pharmacy contact our office and allow 72 hours.    Cancer Center Support Programs:   > Cancer Support Group  2nd Tuesday of the month 1pm-2pm, Journey Room

## 2019-06-15 NOTE — Assessment & Plan Note (Addendum)
1.  Metastatic cholangiocarcinoma: -Foundation 1 CDX MS-stable, TMB-3Muts/Mb, no other actionable mutations. - PET scan on 01/17/2019 shows hepatic hypermetabolism corresponding to gallbladder fundus with SUV of 9.1.  Focus of central right hepatic lobe hypermetabolic and likely corresponds to the site of intrahepatic duct obstruction on the prior diagnostic CT with SUV of 8.3.  Hypermetabolism corresponding to the porta hepatis presumed nodal mass measuring 3.3 cm. -Core biopsy of the right liver mass consistent with well-differentiated adenocarcinoma, positive for CK7 and CDX 2.  CK20 and TTF-1 are negative.  Differential diagnosis includes upper GI/pancreaticobiliary primary. - 5 cycles of gemcitabine and cisplatin from 02/22/2019 through 05/18/2019. -5 cycles of gemcitabine and cisplatin from 02/22/2019 through 05/18/2019. - CEA improved to 12.3 from 44 on 05/25/2019. -CT CAP after 3 cycles on 04/24/2019 compared to PET scan from 01/17/2019 did not show significant change in the index lesions in the liver or enlarged porta hepatis lymph nodes.  - She has tolerated last cycle reasonably well.  Denies any tingling or numbness in extremities.  Mild fatigue is still present. -We reviewed her blood work.  Creatinine today is 1.45.  We will give her hydration today.  I will hold her chemotherapy. -She will come back tomorrow and recheck her creatinine.  If there is any improvement in her creatinine to around 1.2, she may proceed with her next cycle.  She was also encouraged to drink a lot of fluids.  2.  Hypokalemia: -She is on potassium 10 mEq 2 tablets twice daily.  3.  Normocytic anemia: -This is from myelosuppression from chemo.  Hemoglobin today is 8.6.

## 2019-06-15 NOTE — Patient Instructions (Signed)
[facility] at [facility] Discharge Instructions  Labs drawn from portacath today   Thank you for choosing [facility] at [facility] to provide your oncology and hematology care.  To afford each patient quality time with our provider, please arrive at least 15 minutes before your scheduled appointment time.   If you have a lab appointment with the Cancer Center please come in thru the  Main Entrance and check in at the main information desk  You need to re-schedule your appointment should you arrive 10 or more minutes late.  We strive to give you quality time with our providers, and arriving late affects you and other patients whose appointments are after yours.  Also, if you no show three or more times for appointments you may be dismissed from the clinic at the providers discretion.     Again, thank you for choosing [facility].  Our hope is that these requests will decrease the amount of time that you wait before being seen by our physicians.       _____________________________________________________________  Should you have questions after your visit to [facility], please contact our office at [phone] between the hours of 8:00 a.m. and 4:30 p.m.  Voicemails left after 4:00 p.m. will not be returned until the following business day.  For prescription refill requests, have your pharmacy contact our office and allow 72 hours.    Cancer Center Support Programs:   > Cancer Support Group  2nd Tuesday of the month 1pm-2pm, Journey Room   

## 2019-06-15 NOTE — Patient Instructions (Signed)
[place] at [facility] Discharge Instructions  Chemo tx held today. IV hydration with potassium and magnesium given today. Follow-up as scheduled. Call clinic for any questions or concerns   Thank you for choosing [place] at [facility] to provide your oncology and hematology care.  To afford each patient quality time with our provider, please arrive at least 15 minutes before your scheduled appointment time.   If you have a lab appointment with the [facility] please come in thru the  Main Entrance and check in at the main information desk  You need to re-schedule your appointment should you arrive 10 or more minutes late.  We strive to give you quality time with our providers, and arriving late affects you and other patients whose appointments are after yours.  Also, if you no show three or more times for appointments you may be dismissed from the clinic at the providers discretion.     Again, thank you for choosing [facility].  Our hope is that these requests will decrease the amount of time that you wait before being seen by our physicians.       _____________________________________________________________  Should you have questions after your visit to [facility], please contact our office at [phone] between the hours of 8:00 a.m. and 4:30 p.m.  Voicemails left after 4:00 p.m. will not be returned until the following business day.  For prescription refill requests, have your pharmacy contact our office and allow 72 hours.    Cancer Center Support Programs:   > Cancer Support Group  2nd Tuesday of the month 1pm-2pm, Journey Room

## 2019-06-15 NOTE — Progress Notes (Signed)
[address], [place] [address]   CLINIC:  Medical Oncology/Hematology  PCP:  Cory Munch, PA-C [place] [address] [phone]   REASON FOR VISIT: Follow-up for nausea and vomiting  CURRENT THERAPY: Cisplatin and gemcitabine.  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[place])   02/22/2019 -  Chemotherapy   The patient had palonosetron (ALOXI) injection 0.25 mg, 0.25 mg, Intravenous,  Once, 6 of 6 cycles Administration: 0.25 mg (02/22/2019), 0.25 mg (03/01/2019), 0.25 mg (03/15/2019), 0.25 mg (03/22/2019), 0.25 mg (04/06/2019), 0.25 mg (04/13/2019), 0.25 mg (04/27/2019), 0.25 mg (05/04/2019), 0.25 mg (05/18/2019), 0.25 mg (05/25/2019) pegfilgrastim-cbqv (UDENYCA) injection 6 mg, 6 mg, Subcutaneous, Once, 6 of 6 cycles Administration: 6 mg (03/02/2019), 6 mg (03/24/2019), 6 mg (05/05/2019), 6 mg (05/26/2019) CISplatin (PLATINOL) 52 mg in sodium chloride 0.9 % 250 mL chemo infusion, 25 mg/m2 = 52 mg, Intravenous,  Once, 6 of 6 cycles Administration: 52 mg (02/22/2019), 52 mg (03/01/2019), 52 mg (03/15/2019), 52 mg (03/22/2019), 52 mg (04/06/2019), 52 mg (04/13/2019), 52 mg (04/27/2019), 52 mg (05/04/2019), 52 mg (05/18/2019), 52 mg (05/25/2019) gemcitabine (GEMZAR) 2,000 mg in sodium chloride 0.9 % 250 mL chemo infusion, 2,090 mg, Intravenous,  Once, 6 of 6 cycles Dose modification: 750 mg/m2 (75 % of original dose 1,000 mg/m2, Cycle 4, Reason: Other (see comments), Comment: neutropenia) Administration: 2,000 mg (02/22/2019), 2,000 mg (03/01/2019), 2,000 mg (03/15/2019), 2,000 mg (03/22/2019), 2,000 mg (04/06/2019), 2,000 mg (04/13/2019), 2,000 mg (04/27/2019), 1,558 mg (05/04/2019), 2,000 mg (05/18/2019), 2,000 mg (05/25/2019) fosaprepitant (EMEND) 150 mg, dexamethasone (DECADRON) 12 mg in sodium chloride 0.9 % 145 mL IVPB, , Intravenous,   Once, 6 of 6 cycles Administration:  (02/22/2019),  (03/01/2019),  (03/15/2019),  (03/22/2019),  (04/06/2019),  (04/13/2019),  (04/27/2019),  (05/04/2019),  (05/18/2019),  (05/25/2019)  for chemotherapy treatment.       INTERVAL HISTORY:  Nicole Ibarra 63 y.o. female is seen for follow-up of cholangiocarcinoma.  She received cycle 5 on 05/18/2019.  She is here to initiate cycle 6.  Complains of fatigue.  Appetite is 50 to 75%.  Energy levels are 50%.  Denies any tingling or numbness extremities.  Denies any ringing in the ears.  Denies any hearing loss.  She is able to taste food.  Denies any fevers, night sweats or weight loss.  No ER visits or hospitalizations.    REVIEW OF SYSTEMS:  Review of Systems  Constitutional: Positive for fatigue.  All other systems reviewed and are negative.    PAST MEDICAL/SURGICAL HISTORY:  Past Medical History:  Diagnosis Date  . Anxiety   . Arthritis   . Depression   . Hypertension    Past Surgical History:  Procedure Laterality Date  . COLONOSCOPY N/A 10/23/2016   Procedure: COLONOSCOPY;  Surgeon: Danie Binder, MD;  Location: [facility];  Service: Endoscopy;  Laterality: N/A;  11:30 Am  . POLYPECTOMY  10/23/2016   Procedure: POLYPECTOMY;  Surgeon: Danie Binder, MD;  Location: [facility];  Service: Endoscopy;;  sigmoid colon polyp  . PORTACATH PLACEMENT Left 02/16/2019   Procedure: INSERTION PORT-A-CATH (attached catheter in left subclavian);  Surgeon: Virl Cagey, MD;  Location: [facility] ORS;  Service: General;  Laterality: Left;  . RT HIP SURGERY    . TOTAL HIP ARTHROPLASTY Left 04/07/2016   Procedure: LEFT TOTAL HIP ARTHROPLASTY ANTERIOR APPROACH;  Surgeon: Rodman Key  Alvan Dame, MD;  Location: [facility];  Service: Orthopedics;  Laterality: Left;  Unsuccessful for Spinal, went to General     SOCIAL HISTORY:  Social History   Socioeconomic History  . Marital status: Single    Spouse name: Not on file  . Number of children: Not on file  . Years of  education: Not on file  . Highest education level: Not on file  Occupational History  . Not on file  Social Needs  . Financial resource strain: Not hard at all  . Food insecurity    Worry: Never true    Inability: Never true  . Transportation needs    Medical: No    Non-medical: No  Tobacco Use  . Smoking status: Current Every Day Smoker    Packs/day: 0.25    Years: 42.00    Pack years: 10.50  . Smokeless tobacco: Never Used  Substance and Sexual Activity  . Alcohol use: No  . Drug use: No  . Sexual activity: Not Currently  Lifestyle  . Physical activity    Days per week: 0 days    Minutes per session: 0 min  . Stress: Only a little  Relationships  . Social Herbalist on phone: Three times a week    Gets together: Three times a week    Attends religious service: More than 4 times per year    Active member of club or organization: Yes    Attends meetings of clubs or organizations: More than 4 times per year    Relationship status: Never married  . Intimate partner violence    Fear of current or ex partner: No    Emotionally abused: No    Physically abused: No    Forced sexual activity: No  Other Topics Concern  . Not on file  Social History Narrative  . Not on file    FAMILY HISTORY:  Family History  Problem Relation Age of Onset  . Hypertension Mother   . Cancer Father   . Hypertension Brother   . Cancer Brother   . Hypertension Sister   . Cancer Sister     CURRENT MEDICATIONS:  Outpatient Encounter Medications as of 06/15/2019  Medication Sig  . amLODIPine-Valsartan-HCTZ 10-320-25 MG TABS Take 1 tablet by mouth daily.  Marland Kitchen atorvastatin (LIPITOR) 20 MG tablet   . CISPLATIN IV Inject into the vein. Day 1, 8 every 21 days  . doxazosin (CARDURA) 2 MG tablet Take 2 mg by mouth every evening.   Marland Kitchen GEMCITABINE HCL IV Inject into the vein. Day 1, 8 every 21 days  . HYDROcodone-acetaminophen (NORCO) 5-325 MG tablet Take 1 tablet by mouth every 4 (four)  hours as needed for moderate pain.  Marland Kitchen KLOR-CON M20 20 MEQ tablet TAKE 1 TABLET BY MOUTH TWICE A DAY  . lidocaine (XYLOCAINE) 2 % solution Swish and swallow 1 tablespoon four times a day prn sore mouth  . lidocaine-prilocaine (EMLA) cream Apply to skin over port a cath one hour prior to chemotherapy appointment  . naproxen sodium (ALEVE) 220 MG tablet Take 220 mg by mouth 2 (two) times daily as needed.   . potassium chloride (K-DUR) 10 MEQ tablet Take 2 tablets (20 mEq total) by mouth 2 (two) times daily.  . prochlorperazine (COMPAZINE) 10 MG tablet Take 1 tablet (10 mg total) by mouth every 6 (six) hours as needed (Nausea or vomiting).  . vitamin B-12 (CYANOCOBALAMIN) 50 MCG tablet Take 50 mcg by mouth daily.  . [  EXPIRED] 0.9 %  sodium chloride infusion   . [EXPIRED] dextrose 5 % and 0.45% NaCl 1,000 mL with potassium chloride 20 mEq, magnesium sulfate 12 mEq infusion    No facility-administered encounter medications on file as of 06/15/2019.     ALLERGIES:  No Known Allergies   PHYSICAL EXAM:  ECOG Performance status: 1  Vitals:   06/15/19 0755  BP: (!) 169/62  Pulse: 70  Resp: 18  Temp: (!) 97.1 F (36.2 C)  SpO2: 97%   Filed Weights   06/15/19 0755  Weight: 206 lb (93.4 kg)    Physical Exam Constitutional:      Appearance: Normal appearance. She is normal weight.  Cardiovascular:     Rate and Rhythm: Normal rate and regular rhythm.     Heart sounds: Normal heart sounds.  Pulmonary:     Effort: Pulmonary effort is normal.     Breath sounds: Normal breath sounds.  Abdominal:     General: Bowel sounds are normal.     Palpations: Abdomen is soft.  Musculoskeletal: Normal range of motion.  Skin:    General: Skin is warm and dry.  Neurological:     Mental Status: She is alert and oriented to person, place, and time. Mental status is at baseline.  Psychiatric:        Mood and Affect: Mood normal.        Behavior: Behavior normal.        Thought Content: Thought  content normal.        Judgment: Judgment normal.      LABORATORY DATA:  I have reviewed the labs as listed.  CBC    Component Value Date/Time   WBC 8.0 06/15/2019 0753   RBC 2.48 (L) 06/15/2019 0753   HGB 8.6 (L) 06/15/2019 0753   HCT 25.9 (L) 06/15/2019 0753   PLT 257 06/15/2019 0753   MCV 104.4 (H) 06/15/2019 0753   MCH 34.7 (H) 06/15/2019 0753   MCHC 33.2 06/15/2019 0753   RDW 17.0 (H) 06/15/2019 0753   LYMPHSABS 2.4 06/15/2019 0753   MONOABS 0.9 06/15/2019 0753   EOSABS 0.2 06/15/2019 0753   BASOSABS 0.1 06/15/2019 0753   CMP Latest Ref Rng & Units 06/15/2019 06/08/2019 05/25/2019  Glucose 70 - 99 mg/dL 102(H) 110(H) 106(H)  BUN 8 - 23 mg/dL _0 Creatinine 0.44 - 1.00 mg/dL 1.45(H) 1.56(H) 1.22(H)  Sodium 135 - 145 mmol/L 136 138 136  Potassium 3.5 - 5.1 mmol/L 3.7 3.2(L) 3.9  Chloride 98 - 111 mmol/L 102 99 101  CO2 22 - 32 mmol/L _1 Calcium 8.9 - 10.3 mg/dL 9.3 9.4 9.3  Total Protein 6.5 - 8.1 g/dL 7.2 6.8 6.9  Total Bilirubin 0.3 - 1.2 mg/dL 0.3 0.4 0.5  Alkaline Phos 38 - 126 U/L 101 121 141(H)  AST 15 - 41 U/L 13(L) 16 17  ALT 0 - 44 U/L _2 All questions were answered to patient's stated satisfaction. Encouraged patient to call with any new concerns or questions before his next visit to the cancer center and we can certain see him sooner, if needed.     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  Metastatic cholangiocarcinoma: -Foundation 1 CDX MS-stable, TMB-3Muts/Mb, no other actionable mutations. - PET scan on 01/17/2019 shows hepatic hypermetabolism corresponding to gallbladder fundus with SUV of 9.1.  Focus of central right hepatic lobe hypermetabolic and likely corresponds to the  site of intrahepatic duct obstruction on the prior diagnostic CT with SUV of 8.3.  Hypermetabolism corresponding to the porta hepatis presumed nodal mass measuring 3.3 cm. -Core biopsy of the right liver mass consistent with  well-differentiated adenocarcinoma, positive for CK7 and CDX 2.  CK20 and TTF-1 are negative.  Differential diagnosis includes upper GI/pancreaticobiliary primary. - 5 cycles of gemcitabine and cisplatin from 02/22/2019 through 05/18/2019. -5 cycles of gemcitabine and cisplatin from 02/22/2019 through 05/18/2019. - CEA improved to 12.3 from 44 on 05/25/2019. -CT CAP after 3 cycles on 04/24/2019 compared to PET scan from 01/17/2019 did not show significant change in the index lesions in the liver or enlarged porta hepatis lymph nodes.  - She has tolerated last cycle reasonably well.  Denies any tingling or numbness in extremities.  Mild fatigue is still present. -We reviewed her blood work.  Creatinine today is 1.45.  We will give her hydration today.  I will hold her chemotherapy. -She will come back tomorrow and recheck her creatinine.  If there is any improvement in her creatinine to around 1.2, she may proceed with her next cycle.  She was also encouraged to drink a lot of fluids.  2.  Hypokalemia: -She is on potassium 10 mEq 2 tablets twice daily.  3.  Normocytic anemia: -This is from myelosuppression from chemo.  Hemoglobin today is 8.6.    Total time spent is 25 minutes with more than 50% of the time spent face-to-face discussing treatment plan, counseling and coordination of care.  Orders placed this encounter:  No orders of the defined types were placed in this encounter.     Derek Jack, MD [place] [phone]

## 2019-06-16 ENCOUNTER — Ambulatory Visit (HOSPITAL_COMMUNITY): Payer: Medicare Other

## 2019-06-16 ENCOUNTER — Encounter (HOSPITAL_COMMUNITY): Payer: Self-pay

## 2019-06-16 ENCOUNTER — Inpatient Hospital Stay (HOSPITAL_COMMUNITY): Payer: Medicare Other

## 2019-06-16 ENCOUNTER — Other Ambulatory Visit (HOSPITAL_COMMUNITY): Payer: Self-pay

## 2019-06-16 VITALS — BP 155/55 | HR 74 | Temp 96.2°F | Resp 18

## 2019-06-16 DIAGNOSIS — C221 Intrahepatic bile duct carcinoma: Secondary | ICD-10-CM | POA: Diagnosis not present

## 2019-06-16 LAB — COMPREHENSIVE METABOLIC PANEL
ALT: 9 U/L (ref 0–44)
AST: 14 U/L — ABNORMAL LOW (ref 15–41)
Albumin: 3.7 g/dL (ref 3.5–5.0)
Alkaline Phosphatase: 94 U/L (ref 38–126)
Anion gap: 9 (ref 5–15)
BUN: 20 mg/dL (ref 8–23)
CO2: 23 mmol/L (ref 22–32)
Calcium: 9 mg/dL (ref 8.9–10.3)
Chloride: 103 mmol/L (ref 98–111)
Creatinine, Ser: 1.27 mg/dL — ABNORMAL HIGH (ref 0.44–1.00)
GFR calc Af Amer: 52 mL/min — ABNORMAL LOW (ref >60)
GFR calc non Af Amer: 45 mL/min — ABNORMAL LOW (ref >60)
Glucose, Bld: 105 mg/dL — ABNORMAL HIGH (ref 70–99)
Potassium: 3.6 mmol/L (ref 3.5–5.1)
Sodium: 135 mmol/L (ref 135–145)
Total Bilirubin: 0.4 mg/dL (ref 0.3–1.2)
Total Protein: 6.9 g/dL (ref 6.5–8.1)

## 2019-06-16 LAB — CEA: CEA: 9.9 ng/mL — ABNORMAL HIGH (ref 0.0–4.7)

## 2019-06-16 MED ORDER — HEPARIN SOD (PORK) LOCK FLUSH 100 UNIT/ML IV SOLN
500.0000 [IU] | Freq: Once | INTRAVENOUS | Status: AC | PRN
  Administered 2019-06-16: 500 [IU]

## 2019-06-16 MED ORDER — PALONOSETRON HCL INJECTION 0.25 MG/5ML
0.2500 mg | Freq: Once | INTRAVENOUS | Status: AC
  Administered 2019-06-16: 0.25 mg via INTRAVENOUS

## 2019-06-16 MED ORDER — PALONOSETRON HCL INJECTION 0.25 MG/5ML
INTRAVENOUS | Status: AC
  Filled 2019-06-16: qty 5

## 2019-06-16 MED ORDER — SODIUM CHLORIDE 0.9% FLUSH
10.0000 mL | INTRAVENOUS | Status: DC | PRN
  Administered 2019-06-16 (×2): 10 mL
  Filled 2019-06-16 (×2): qty 10

## 2019-06-16 MED ORDER — SODIUM CHLORIDE 0.9 % IV SOLN
Freq: Once | INTRAVENOUS | Status: AC
  Administered 2019-06-16: 09:00:00 via INTRAVENOUS
  Filled 2019-06-16: qty 5

## 2019-06-16 MED ORDER — SODIUM CHLORIDE 0.9 % IV SOLN
25.0000 mg/m2 | Freq: Once | INTRAVENOUS | Status: AC
  Administered 2019-06-16: 52 mg via INTRAVENOUS
  Filled 2019-06-16: qty 52

## 2019-06-16 MED ORDER — POTASSIUM CHLORIDE 2 MEQ/ML IV SOLN
Freq: Once | INTRAVENOUS | Status: AC
  Administered 2019-06-16: 09:00:00 via INTRAVENOUS
  Filled 2019-06-16: qty 10

## 2019-06-16 MED ORDER — SODIUM CHLORIDE 0.9 % IV SOLN
Freq: Once | INTRAVENOUS | Status: AC
  Administered 2019-06-16: 09:00:00 via INTRAVENOUS

## 2019-06-16 MED ORDER — SODIUM CHLORIDE 0.9 % IV SOLN
2000.0000 mg | Freq: Once | INTRAVENOUS | Status: AC
  Administered 2019-06-16: 2000 mg via INTRAVENOUS
  Filled 2019-06-16: qty 52.6

## 2019-06-16 NOTE — Patient Instructions (Signed)
[facility] Discharge Instructions for Patients Receiving Chemotherapy  Today you received the following chemotherapy agents   To help prevent nausea and vomiting after your treatment, we encourage you to take your nausea medication   If you develop nausea and vomiting that is not controlled by your nausea medication, call the clinic.   BELOW ARE SYMPTOMS THAT SHOULD BE REPORTED IMMEDIATELY:  *FEVER GREATER THAN 100.5 F  *CHILLS WITH OR WITHOUT FEVER  NAUSEA AND VOMITING THAT IS NOT CONTROLLED WITH YOUR NAUSEA MEDICATION  *UNUSUAL SHORTNESS OF BREATH  *UNUSUAL BRUISING OR BLEEDING  TENDERNESS IN MOUTH AND THROAT WITH OR WITHOUT PRESENCE OF ULCERS  *URINARY PROBLEMS  *BOWEL PROBLEMS  UNUSUAL RASH Items with * indicate a potential emergency and should be followed up as soon as possible.  Feel free to call the clinic should you have any questions or concerns. The clinic phone number is [phone].  Please show the CHEMO ALERT CARD at check-in to the Emergency Department and triage nurse.   

## 2019-06-16 NOTE — Progress Notes (Signed)
Pt presents today for serum creatinine labs to determine tx today and hydration fluids. VS within parameters for treatment. Per Dr. Tomie China note, " Continue with next cycle if creat is 1.2 or lower. Pt has no complaints of any changes since the last visit.   Pt voided 249mls.   Treatment given today per MD orders. Tolerated infusion without adverse affects. Vital signs stable. No complaints at this time. Discharged from clinic ambulatory. F/U with [facility] as scheduled.

## 2019-06-22 ENCOUNTER — Inpatient Hospital Stay (HOSPITAL_COMMUNITY): Payer: Medicare Other

## 2019-06-22 ENCOUNTER — Encounter (HOSPITAL_COMMUNITY): Payer: Self-pay | Admitting: Hematology

## 2019-06-22 ENCOUNTER — Inpatient Hospital Stay (HOSPITAL_BASED_OUTPATIENT_CLINIC_OR_DEPARTMENT_OTHER): Payer: Medicare Other | Admitting: Hematology

## 2019-06-22 ENCOUNTER — Other Ambulatory Visit: Payer: Self-pay

## 2019-06-22 VITALS — BP 155/61 | HR 63 | Temp 98.3°F | Resp 18

## 2019-06-22 DIAGNOSIS — I10 Essential (primary) hypertension: Secondary | ICD-10-CM

## 2019-06-22 DIAGNOSIS — C787 Secondary malignant neoplasm of liver and intrahepatic bile duct: Secondary | ICD-10-CM

## 2019-06-22 DIAGNOSIS — F17210 Nicotine dependence, cigarettes, uncomplicated: Secondary | ICD-10-CM

## 2019-06-22 DIAGNOSIS — D649 Anemia, unspecified: Secondary | ICD-10-CM | POA: Diagnosis not present

## 2019-06-22 DIAGNOSIS — E785 Hyperlipidemia, unspecified: Secondary | ICD-10-CM

## 2019-06-22 DIAGNOSIS — C221 Intrahepatic bile duct carcinoma: Secondary | ICD-10-CM | POA: Diagnosis not present

## 2019-06-22 DIAGNOSIS — E876 Hypokalemia: Secondary | ICD-10-CM | POA: Diagnosis not present

## 2019-06-22 DIAGNOSIS — R5383 Other fatigue: Secondary | ICD-10-CM | POA: Diagnosis not present

## 2019-06-22 DIAGNOSIS — Z79899 Other long term (current) drug therapy: Secondary | ICD-10-CM

## 2019-06-22 LAB — COMPREHENSIVE METABOLIC PANEL
ALT: 13 U/L (ref 0–44)
AST: 14 U/L — ABNORMAL LOW (ref 15–41)
Albumin: 3.6 g/dL (ref 3.5–5.0)
Alkaline Phosphatase: 85 U/L (ref 38–126)
Anion gap: 10 (ref 5–15)
BUN: 33 mg/dL — ABNORMAL HIGH (ref 8–23)
CO2: 25 mmol/L (ref 22–32)
Calcium: 9.3 mg/dL (ref 8.9–10.3)
Chloride: 101 mmol/L (ref 98–111)
Creatinine, Ser: 1.34 mg/dL — ABNORMAL HIGH (ref 0.44–1.00)
GFR calc Af Amer: 48 mL/min — ABNORMAL LOW (ref >60)
GFR calc non Af Amer: 42 mL/min — ABNORMAL LOW (ref >60)
Glucose, Bld: 103 mg/dL — ABNORMAL HIGH (ref 70–99)
Potassium: 3.7 mmol/L (ref 3.5–5.1)
Sodium: 136 mmol/L (ref 135–145)
Total Bilirubin: 0.3 mg/dL (ref 0.3–1.2)
Total Protein: 6.8 g/dL (ref 6.5–8.1)

## 2019-06-22 LAB — CBC WITH DIFFERENTIAL/PLATELET
Abs Immature Granulocytes: 0 10*3/uL (ref 0.00–0.07)
Basophils Absolute: 0 10*3/uL (ref 0.0–0.1)
Basophils Relative: 1 %
Eosinophils Absolute: 0.1 10*3/uL (ref 0.0–0.5)
Eosinophils Relative: 3 %
HCT: 24.3 % — ABNORMAL LOW (ref 36.0–46.0)
Hemoglobin: 8.1 g/dL — ABNORMAL LOW (ref 12.0–15.0)
Immature Granulocytes: 0 %
Lymphocytes Relative: 51 %
Lymphs Abs: 1.8 10*3/uL (ref 0.7–4.0)
MCH: 34.3 pg — ABNORMAL HIGH (ref 26.0–34.0)
MCHC: 33.3 g/dL (ref 30.0–36.0)
MCV: 103 fL — ABNORMAL HIGH (ref 80.0–100.0)
Monocytes Absolute: 0.1 10*3/uL (ref 0.1–1.0)
Monocytes Relative: 3 %
Neutro Abs: 1.5 10*3/uL — ABNORMAL LOW (ref 1.7–7.7)
Neutrophils Relative %: 42 %
Platelets: 130 10*3/uL — ABNORMAL LOW (ref 150–400)
RBC: 2.36 MIL/uL — ABNORMAL LOW (ref 3.87–5.11)
RDW: 14.9 % (ref 11.5–15.5)
WBC: 3.6 10*3/uL — ABNORMAL LOW (ref 4.0–10.5)
nRBC: 0 % (ref 0.0–0.2)

## 2019-06-22 LAB — MAGNESIUM: Magnesium: 2 mg/dL (ref 1.7–2.4)

## 2019-06-22 MED ORDER — SODIUM CHLORIDE 0.9 % IV SOLN
2000.0000 mg | Freq: Once | INTRAVENOUS | Status: AC
  Administered 2019-06-22: 2000 mg via INTRAVENOUS
  Filled 2019-06-22: qty 53

## 2019-06-22 MED ORDER — SODIUM CHLORIDE 0.9 % IV SOLN
25.0000 mg/m2 | Freq: Once | INTRAVENOUS | Status: AC
  Administered 2019-06-22: 52 mg via INTRAVENOUS
  Filled 2019-06-22: qty 52

## 2019-06-22 MED ORDER — SODIUM CHLORIDE 0.9 % IV SOLN: Freq: Once | INTRAVENOUS | Status: DC

## 2019-06-22 MED ORDER — PALONOSETRON HCL INJECTION 0.25 MG/5ML
0.2500 mg | Freq: Once | INTRAVENOUS | Status: AC
  Administered 2019-06-22: 0.25 mg via INTRAVENOUS
  Filled 2019-06-22: qty 5

## 2019-06-22 MED ORDER — SODIUM CHLORIDE 0.9 % IV SOLN
Freq: Once | INTRAVENOUS | Status: AC
  Administered 2019-06-22: 09:00:00 via INTRAVENOUS

## 2019-06-22 MED ORDER — POTASSIUM CHLORIDE 2 MEQ/ML IV SOLN
Freq: Once | INTRAVENOUS | Status: AC
  Administered 2019-06-22: 09:00:00 via INTRAVENOUS
  Filled 2019-06-22: qty 10

## 2019-06-22 MED ORDER — SODIUM CHLORIDE 0.9% FLUSH
10.0000 mL | INTRAVENOUS | Status: DC | PRN
  Administered 2019-06-22: 10 mL
  Filled 2019-06-22: qty 10

## 2019-06-22 MED ORDER — HEPARIN SOD (PORK) LOCK FLUSH 100 UNIT/ML IV SOLN
500.0000 [IU] | Freq: Once | INTRAVENOUS | Status: AC | PRN
  Administered 2019-06-22: 500 [IU]

## 2019-06-22 MED ORDER — SODIUM CHLORIDE 0.9 % IV SOLN
Freq: Once | INTRAVENOUS | Status: AC
  Administered 2019-06-22: 10:00:00 via INTRAVENOUS
  Filled 2019-06-22: qty 5

## 2019-06-22 MED ORDER — LIDOCAINE-PRILOCAINE 2.5-2.5 % EX CREA: TOPICAL_CREAM | CUTANEOUS | 3 refills | Status: AC

## 2019-06-22 NOTE — Assessment & Plan Note (Signed)
1.  Metastatic cholangiocarcinoma: -Foundation 1 CDX MS-stable, TMB- 3Muts/Mb, no other actionable mutations. -PET scan on 01/17/2019 shows hepatic hypermetabolism corresponding to gallbladder fundus with SUV of 9.1.  Focus of central right hepatic lobe hypermetabolism likely corresponds to the site of intrahepatic duct obstruction on the prior diagnostic CT with SUV of 8.3.  Hypermetabolism corresponding to the porta hepatis presumed nodal mass measuring 3.3 cm. -Core biopsy of the right liver mass consistent with well-differentiated carcinoma, positive for CK7 and CDX 2.  CK20 and TTF-1 are negative.  Differential diagnosis includes upper GI/pancreaticobiliary primary. - 6 cycles of gemcitabine and cisplatin from 02/22/2019 through 06/16/2019. -CT CAP after 3 cycles on 04/24/2019 compared to PET scan from 01/17/2019 did not show significant change in the index lesions in the liver and enlarged porta hepatis lymph nodes. - CEA improved to 9.9 (06/15/2019) from 44 (05/25/2019). - I have reviewed her labs today.  CBC is adequate to proceed with treatment.  However creatinine is slightly elevated at 1.3.  She will proceed with her day treatment today.  I will bring her back tomorrow to receive more fluids. -We will see her back in 2 weeks for follow-up.  I plan to repeat scan after cycle 7 as her CEA is continuing to improve.  2.  Hypokalemia: - She will continue potassium 10 mEq 2 tablets twice daily.  Potassium is 3.7.  3.  Normocytic anemia: -This is from myelosuppression from chemo.  Hemoglobin is 8.1.

## 2019-06-22 NOTE — Patient Instructions (Signed)
[facility] at [facility] Discharge Instructions  Labs drawn from portacath   Thank you for choosing [facility] at [facility] to provide your oncology and hematology care.  To afford each patient quality time with our provider, please arrive at least 15 minutes before your scheduled appointment time.   If you have a lab appointment with the Cancer Center please come in thru the  Main Entrance and check in at the main information desk  You need to re-schedule your appointment should you arrive 10 or more minutes late.  We strive to give you quality time with our providers, and arriving late affects you and other patients whose appointments are after yours.  Also, if you no show three or more times for appointments you may be dismissed from the clinic at the providers discretion.     Again, thank you for choosing [facility].  Our hope is that these requests will decrease the amount of time that you wait before being seen by our physicians.       _____________________________________________________________  Should you have questions after your visit to [facility], please contact our office at [phone] between the hours of 8:00 a.m. and 4:30 p.m.  Voicemails left after 4:00 p.m. will not be returned until the following business day.  For prescription refill requests, have your pharmacy contact our office and allow 72 hours.    Cancer Center Support Programs:   > Cancer Support Group  2nd Tuesday of the month 1pm-2pm, Journey Room   

## 2019-06-22 NOTE — Patient Instructions (Signed)
[facility] Discharge Instructions for Patients Receiving Chemotherapy   Beginning January 23rd 2017 lab work for the [facility] will be done in the  Main lab at [facility] on 1st floor. If you have a lab appointment with the [place] please come in thru the  Main Entrance and check in at the main information desk   Today you received the following chemotherapy agents Cisplatin and Gemzar as well as IV hydration. Follow-up as scheduled. Call clinic for any questions or concerns  To help prevent nausea and vomiting after your treatment, we encourage you to take your nausea medication   If you develop nausea and vomiting, or diarrhea that is not controlled by your medication, call the clinic.  The clinic phone number is [phone]. Office hours are Monday-Friday 8:30am-5:00pm.  BELOW ARE SYMPTOMS THAT SHOULD BE REPORTED IMMEDIATELY:  *FEVER GREATER THAN 101.0 F  *CHILLS WITH OR WITHOUT FEVER  NAUSEA AND VOMITING THAT IS NOT CONTROLLED WITH YOUR NAUSEA MEDICATION  *UNUSUAL SHORTNESS OF BREATH  *UNUSUAL BRUISING OR BLEEDING  TENDERNESS IN MOUTH AND THROAT WITH OR WITHOUT PRESENCE OF ULCERS  *URINARY PROBLEMS  *BOWEL PROBLEMS  UNUSUAL RASH Items with * indicate a potential emergency and should be followed up as soon as possible. If you have an emergency after office hours please contact your primary care physician or go to the nearest emergency department.  Please call the clinic during office hours if you have any questions or concerns.   You may also contact the Patient Navigator at [phone] should you have any questions or need assistance in obtaining follow up care.      Resources For Cancer Patients and their Caregivers ? American Cancer Society: Can assist with transportation, wigs, general needs, runs Look Good Feel Better.        470 561 0347 ? Cancer Care: Provides financial assistance, online support groups, medication/co-pay  assistance.  1-800-813-HOPE (859) 218-6019) ? [facility] Assists [place] cancer patients and their families through emotional , educational and financial support.  832-377-4356 ? [place] DSS Where to apply for food stamps, Medicaid and utility assistance. 270-761-7057 ? [facility]: Transportation to medical appointments. (603)498-0212 ? Social Security Administration: May apply for disability if have a Stage IV cancer. 336-366-2604 (559)169-1023 ? [employer], Disability and Transit Services: Assists with nutrition, care and transit needs. (518)050-7740

## 2019-06-22 NOTE — Patient Instructions (Addendum)
[facility] at [facility] Discharge Instructions  You were seen today by Dr. Katragadda. He went over your recent lab results. He will see you back in 2 weeks for labs and follow up.   Thank you for choosing [facility] at [facility] to provide your oncology and hematology care.  To afford each patient quality time with our provider, please arrive at least 15 minutes before your scheduled appointment time.   If you have a lab appointment with the Cancer Center please come in thru the  Main Entrance and check in at the main information desk  You need to re-schedule your appointment should you arrive 10 or more minutes late.  We strive to give you quality time with our providers, and arriving late affects you and other patients whose appointments are after yours.  Also, if you no show three or more times for appointments you may be dismissed from the clinic at the providers discretion.     Again, thank you for choosing [facility].  Our hope is that these requests will decrease the amount of time that you wait before being seen by our physicians.       _____________________________________________________________  Should you have questions after your visit to [facility], please contact our office at [phone] between the hours of 8:00 a.m. and 4:30 p.m.  Voicemails left after 4:00 p.m. will not be returned until the following business day.  For prescription refill requests, have your pharmacy contact our office and allow 72 hours.    Cancer Center Support Programs:   > Cancer Support Group  2nd Tuesday of the month 1pm-2pm, Journey Room    

## 2019-06-22 NOTE — Progress Notes (Signed)
[id number] Labs reviewed with and pt seen by Dr. Delton Coombes and pt approved for chemo tx today per MD                                                                                     Nicole Ibarra tolerated chemo tx with hydration well without complaints or incident. Port left accessed,saline locked and flushed easily per protocol for use tomorrow. VSS upon discharge. Pt discharged self ambulatory in satisfactory condition

## 2019-06-22 NOTE — Progress Notes (Signed)
[place], [place] [address]   CLINIC:  Medical Oncology/Hematology  PCP:  Cory Munch, PA-C [place] [phone]   REASON FOR VISIT: Follow-up for nausea and vomiting  CURRENT THERAPY: Cisplatin and gemcitabine.  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 ([place])   02/22/2019 -  Chemotherapy   The patient had palonosetron (ALOXI) injection 0.25 mg, 0.25 mg, Intravenous,  Once, 6 of 6 cycles Administration: 0.25 mg (02/22/2019), 0.25 mg (03/01/2019), 0.25 mg (03/15/2019), 0.25 mg (03/22/2019), 0.25 mg (04/06/2019), 0.25 mg (04/13/2019), 0.25 mg (04/27/2019), 0.25 mg (05/04/2019), 0.25 mg (05/18/2019), 0.25 mg (05/25/2019), 0.25 mg (06/16/2019) pegfilgrastim-cbqv (UDENYCA) injection 6 mg, 6 mg, Subcutaneous, Once, 6 of 6 cycles Administration: 6 mg (03/02/2019), 6 mg (03/24/2019), 6 mg (05/05/2019), 6 mg (05/26/2019) CISplatin (PLATINOL) 52 mg in sodium chloride 0.9 % 250 mL chemo infusion, 25 mg/m2 = 52 mg, Intravenous,  Once, 6 of 6 cycles Administration: 52 mg (02/22/2019), 52 mg (03/01/2019), 52 mg (03/15/2019), 52 mg (03/22/2019), 52 mg (04/06/2019), 52 mg (04/13/2019), 52 mg (04/27/2019), 52 mg (05/04/2019), 52 mg (05/18/2019), 52 mg (05/25/2019), 52 mg (06/16/2019) gemcitabine (GEMZAR) 2,000 mg in sodium chloride 0.9 % 250 mL chemo infusion, 2,090 mg, Intravenous,  Once, 6 of 6 cycles Dose modification: 750 mg/m2 (75 % of original dose 1,000 mg/m2, Cycle 4, Reason: Other (see comments), Comment: neutropenia) Administration: 2,000 mg (02/22/2019), 2,000 mg (03/01/2019), 2,000 mg (03/15/2019), 2,000 mg (03/22/2019), 2,000 mg (04/06/2019), 2,000 mg (04/13/2019), 2,000 mg (04/27/2019), 1,558 mg (05/04/2019), 2,000 mg (05/18/2019), 2,000 mg (05/25/2019), 2,000 mg (06/16/2019) fosaprepitant (EMEND) 150 mg, dexamethasone (DECADRON) 12  mg in sodium chloride 0.9 % 145 mL IVPB, , Intravenous,  Once, 6 of 6 cycles Administration:  (02/22/2019),  (03/01/2019),  (03/15/2019),  (03/22/2019),  (04/06/2019),  (04/13/2019),  (04/27/2019),  (05/04/2019),  (05/18/2019),  (05/25/2019),  (06/16/2019)  for chemotherapy treatment.       INTERVAL HISTORY:  Ms. Lenhard 63 y.o. female seen for cycle 6-day 8 of chemotherapy.  Her cycle 6-day 1 of chemotherapy was on 06/16/2019.  Denies any nausea, vomiting, diarrhea or constipation.  Denies any tingling or numbness in the extremities.  Denies any ringing in the ears or hearing loss.  Appetite has been stable.  She is drinking enough fluids.    REVIEW OF SYSTEMS:  Review of Systems  Constitutional: Positive for fatigue.  All other systems reviewed and are negative.    PAST MEDICAL/SURGICAL HISTORY:  Past Medical History:  Diagnosis Date  . Anxiety   . Arthritis   . Depression   . Hypertension    Past Surgical History:  Procedure Laterality Date  . COLONOSCOPY N/A 10/23/2016   Procedure: COLONOSCOPY;  Surgeon: Danie Binder, MD;  Location: [facility];  Service: Endoscopy;  Laterality: N/A;  11:30 Am  . POLYPECTOMY  10/23/2016   Procedure: POLYPECTOMY;  Surgeon: Danie Binder, MD;  Location: [facility];  Service: Endoscopy;;  sigmoid colon polyp  . PORTACATH PLACEMENT Left 02/16/2019   Procedure: INSERTION PORT-A-CATH (attached catheter in left subclavian);  Surgeon: Virl Cagey, MD;  Location: [facility];  Service: General;  Laterality: Left;  . RT HIP SURGERY    . TOTAL HIP ARTHROPLASTY Left 04/07/2016   Procedure: LEFT TOTAL HIP ARTHROPLASTY ANTERIOR APPROACH;  Surgeon: Paralee Cancel, MD;  Location: [facility];  Service: Orthopedics;  Laterality:  Left;  Unsuccessful for Spinal, went to General     SOCIAL HISTORY:  Social History   Socioeconomic History  . Marital status: Single    Spouse name: Not on file  . Number of children: Not on file  . Years of education: Not on file  .  Highest education level: Not on file  Occupational History  . Not on file  Social Needs  . Financial resource strain: Not hard at all  . Food insecurity    Worry: Never true    Inability: Never true  . Transportation needs    Medical: No    Non-medical: No  Tobacco Use  . Smoking status: Current Every Day Smoker    Packs/day: 0.25    Years: 42.00    Pack years: 10.50  . Smokeless tobacco: Never Used  Substance and Sexual Activity  . Alcohol use: No  . Drug use: No  . Sexual activity: Not Currently  Lifestyle  . Physical activity    Days per week: 0 days    Minutes per session: 0 min  . Stress: Only a little  Relationships  . Social Herbalist on phone: Three times a week    Gets together: Three times a week    Attends religious service: More than 4 times per year    Active member of club or organization: Yes    Attends meetings of clubs or organizations: More than 4 times per year    Relationship status: Never married  . Intimate partner violence    Fear of current or ex partner: No    Emotionally abused: No    Physically abused: No    Forced sexual activity: No  Other Topics Concern  . Not on file  Social History Narrative  . Not on file    FAMILY HISTORY:  Family History  Problem Relation Age of Onset  . Hypertension Mother   . Cancer Father   . Hypertension Brother   . Cancer Brother   . Hypertension Sister   . Cancer Sister     CURRENT MEDICATIONS:  Outpatient Encounter Medications as of 06/22/2019  Medication Sig  . amLODIPine-Valsartan-HCTZ 10-320-25 MG TABS Take 1 tablet by mouth daily.  Marland Kitchen atorvastatin (LIPITOR) 20 MG tablet   . CISPLATIN IV Inject into the vein. Day 1, 8 every 21 days  . doxazosin (CARDURA) 2 MG tablet Take 2 mg by mouth every evening.   Marland Kitchen GEMCITABINE HCL IV Inject into the vein. Day 1, 8 every 21 days  . HYDROcodone-acetaminophen (NORCO) 5-325 MG tablet Take 1 tablet by mouth every 4 (four) hours as needed for moderate  pain.  Marland Kitchen KLOR-CON M20 20 MEQ tablet TAKE 1 TABLET BY MOUTH TWICE A DAY  . lidocaine (XYLOCAINE) 2 % solution Swish and swallow 1 tablespoon four times a day prn sore mouth  . lidocaine-prilocaine (EMLA) cream Apply to skin over port a cath one hour prior to chemotherapy appointment  . naproxen sodium (ALEVE) 220 MG tablet Take 220 mg by mouth 2 (two) times daily as needed.   . potassium chloride (K-DUR) 10 MEQ tablet Take 2 tablets (20 mEq total) by mouth 2 (two) times daily.  . prochlorperazine (COMPAZINE) 10 MG tablet Take 1 tablet (10 mg total) by mouth every 6 (six) hours as needed (Nausea or vomiting).  . vitamin B-12 (CYANOCOBALAMIN) 50 MCG tablet Take 50 mcg by mouth daily.   No facility-administered encounter medications on file as of 06/22/2019.  ALLERGIES:  No Known Allergies   PHYSICAL EXAM:  ECOG Performance status: 1  Vitals:   06/22/19 0753  BP: (!) 155/58  Pulse: (!) 59  Resp: 18  Temp: 97.9 F (36.6 C)  SpO2: 100%   Filed Weights   06/22/19 0753  Weight: 203 lb 3.2 oz (92.2 kg)    Physical Exam Constitutional:      Appearance: Normal appearance. She is normal weight.  Cardiovascular:     Rate and Rhythm: Normal rate and regular rhythm.     Heart sounds: Normal heart sounds.  Pulmonary:     Effort: Pulmonary effort is normal.     Breath sounds: Normal breath sounds.  Abdominal:     General: Bowel sounds are normal.     Palpations: Abdomen is soft.  Musculoskeletal: Normal range of motion.  Skin:    General: Skin is warm and dry.  Neurological:     Mental Status: She is alert and oriented to person, place, and time. Mental status is at baseline.  Psychiatric:        Mood and Affect: Mood normal.        Behavior: Behavior normal.        Thought Content: Thought content normal.        Judgment: Judgment normal.      LABORATORY DATA:  I have reviewed the labs as listed.  CBC    Component Value Date/Time   WBC 3.6 (L) 06/22/2019 0746   RBC  2.36 (L) 06/22/2019 0746   HGB 8.1 (L) 06/22/2019 0746   HCT 24.3 (L) 06/22/2019 0746   PLT 130 (L) 06/22/2019 0746   MCV 103.0 (H) 06/22/2019 0746   MCH 34.3 (H) 06/22/2019 0746   MCHC 33.3 06/22/2019 0746   RDW 14.9 06/22/2019 0746   LYMPHSABS 1.8 06/22/2019 0746   MONOABS 0.1 06/22/2019 0746   EOSABS 0.1 06/22/2019 0746   BASOSABS 0.0 06/22/2019 0746   CMP Latest Ref Rng & Units 06/22/2019 06/16/2019 06/15/2019  Glucose 70 - 99 mg/dL 103(H) 105(H) 102(H)  BUN 8 - 23 mg/dL 33(H) 20 22  Creatinine 0.44 - 1.00 mg/dL 1.34(H) 1.27(H) 1.45(H)  Sodium 135 - 145 mmol/L 136 135 136  Potassium 3.5 - 5.1 mmol/L 3.7 3.6 3.7  Chloride 98 - 111 mmol/L 101 103 102  CO2 22 - 32 mmol/L '25 23 24  '$ Calcium 8.9 - 10.3 mg/dL 9.3 9.0 9.3  Total Protein 6.5 - 8.1 g/dL 6.8 6.9 7.2  Total Bilirubin 0.3 - 1.2 mg/dL 0.3 0.4 0.3  Alkaline Phos 38 - 126 U/L 85 94 101  AST 15 - 41 U/L 14(L) 14(L) 13(L)  ALT 0 - 44 U/L '13 9 8      '$ All questions were answered to patient's stated satisfaction. Encouraged patient to call with any new concerns or questions before his next visit to the cancer center and we can certain see him sooner, if needed.     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  Metastatic cholangiocarcinoma: -Foundation 1 CDX MS-stable, TMB- 3Muts/Mb, no other actionable mutations. -PET scan on 01/17/2019 shows hepatic hypermetabolism corresponding to gallbladder fundus with SUV of 9.1.  Focus of central right hepatic lobe hypermetabolism likely corresponds to the site of intrahepatic duct obstruction on the prior diagnostic CT with SUV of 8.3.  Hypermetabolism corresponding to the porta hepatis presumed nodal mass measuring 3.3 cm. -Core biopsy of the right liver mass consistent with well-differentiated carcinoma, positive for CK7 and CDX  2.  CK20 and TTF-1 are negative.  Differential diagnosis includes upper GI/pancreaticobiliary primary. - 6 cycles of gemcitabine and  cisplatin from 02/22/2019 through 06/16/2019. -CT CAP after 3 cycles on 04/24/2019 compared to PET scan from 01/17/2019 did not show significant change in the index lesions in the liver and enlarged porta hepatis lymph nodes. - CEA improved to 9.9 (06/15/2019) from 44 (05/25/2019). - I have reviewed her labs today.  CBC is adequate to proceed with treatment.  However creatinine is slightly elevated at 1.3.  She will proceed with her day treatment today.  I will bring her back tomorrow to receive more fluids. -We will see her back in 2 weeks for follow-up.  I plan to repeat scan after cycle 7 as her CEA is continuing to improve.  2.  Hypokalemia: - She will continue potassium 10 mEq 2 tablets twice daily.  Potassium is 3.7.  3.  Normocytic anemia: -This is from myelosuppression from chemo.  Hemoglobin is 8.1.   Total time spent is 25 minutes with more than 50% of the time spent face-to-face discussing treatment plan, counseling and coordination of care.  Orders placed this encounter:  No orders of the defined types were placed in this encounter.     Derek Jack, MD [place] [phone]

## 2019-06-23 ENCOUNTER — Inpatient Hospital Stay (HOSPITAL_COMMUNITY): Payer: Medicare Other

## 2019-06-23 VITALS — BP 186/76 | HR 70 | Temp 96.7°F | Resp 18

## 2019-06-23 DIAGNOSIS — C787 Secondary malignant neoplasm of liver and intrahepatic bile duct: Secondary | ICD-10-CM

## 2019-06-23 DIAGNOSIS — C221 Intrahepatic bile duct carcinoma: Secondary | ICD-10-CM

## 2019-06-23 MED ORDER — SODIUM CHLORIDE 0.9% FLUSH
10.0000 mL | Freq: Once | INTRAVENOUS | Status: AC | PRN
  Administered 2019-06-23: 10 mL

## 2019-06-23 MED ORDER — SODIUM CHLORIDE 0.9 % IV SOLN
Freq: Once | INTRAVENOUS | Status: AC
  Administered 2019-06-23: 11:00:00 via INTRAVENOUS
  Filled 2019-06-23: qty 1000

## 2019-06-23 MED ORDER — HEPARIN SOD (PORK) LOCK FLUSH 100 UNIT/ML IV SOLN
500.0000 [IU] | Freq: Once | INTRAVENOUS | Status: AC | PRN
  Administered 2019-06-23: 500 [IU]

## 2019-06-23 MED ORDER — PEGFILGRASTIM-CBQV 6 MG/0.6ML ~~LOC~~ SOSY
6.0000 mg | PREFILLED_SYRINGE | Freq: Once | SUBCUTANEOUS | Status: AC
  Administered 2019-06-23: 6 mg via SUBCUTANEOUS
  Filled 2019-06-23: qty 0.6

## 2019-06-23 NOTE — Patient Instructions (Addendum)
[facility] Discharge Instructions for Patients Receiving Chemotherapy   Beginning January 23rd 2017 lab work for the [facility] will be done in the  Main lab at [facility] on 1st floor. If you have a lab appointment with the [facility] please come in thru the  Main Entrance and check in at the main information desk   Today you received the following IV hydration and Udenyca injection. Follow-up as scheduled. Call clinic for any questions or concerns  To help prevent nausea and vomiting after your treatment, we encourage you to take your nausea medication    If you develop nausea and vomiting, or diarrhea that is not controlled by your medication, call the clinic.  The clinic phone number is [phone]. Office hours are Monday-Friday 8:30am-5:00pm.  BELOW ARE SYMPTOMS THAT SHOULD BE REPORTED IMMEDIATELY:  *FEVER GREATER THAN 101.0 F  *CHILLS WITH OR WITHOUT FEVER  NAUSEA AND VOMITING THAT IS NOT CONTROLLED WITH YOUR NAUSEA MEDICATION  *UNUSUAL SHORTNESS OF BREATH  *UNUSUAL BRUISING OR BLEEDING  TENDERNESS IN MOUTH AND THROAT WITH OR WITHOUT PRESENCE OF ULCERS  *URINARY PROBLEMS  *BOWEL PROBLEMS  UNUSUAL RASH Items with * indicate a potential emergency and should be followed up as soon as possible. If you have an emergency after office hours please contact your primary care physician or go to the nearest emergency department.  Please call the clinic during office hours if you have any questions or concerns.   You may also contact the Patient Navigator at [phone] should you have any questions or need assistance in obtaining follow up care.      Resources For Cancer Patients and their Caregivers ? American Cancer Society: Can assist with transportation, wigs, general needs, runs Look Good Feel Better.        864-394-4488 ? Cancer Care: Provides financial assistance, online support groups, medication/co-pay assistance.  1-800-813-HOPE  (872)226-0358) ? [facility] Assists [place] cancer patients and their families through emotional , educational and financial support.  2186568923 ? [place] DSS Where to apply for food stamps, Medicaid and utility assistance. (848)412-6187 ? [facility]: Transportation to medical appointments. 205 229 9562 ? Social Security Administration: May apply for disability if have a Stage IV cancer. (775) 519-2877 601-797-8914 ? [employer], Disability and Transit Services: Assists with nutrition, care and transit needs. ([phone] 583-9916

## 2019-06-23 NOTE — Progress Notes (Signed)
Nicole Ibarra tolerated IV hydration with potassium and magnesium as well as Udenyca injection well without complaints or incident. VSS upon discharge. Pt discharged self ambulatory in satisfactory condition

## 2019-07-05 ENCOUNTER — Other Ambulatory Visit: Payer: Self-pay

## 2019-07-06 ENCOUNTER — Inpatient Hospital Stay (HOSPITAL_COMMUNITY): Payer: Medicare Other

## 2019-07-06 ENCOUNTER — Inpatient Hospital Stay (HOSPITAL_BASED_OUTPATIENT_CLINIC_OR_DEPARTMENT_OTHER): Payer: Medicare Other | Admitting: Hematology

## 2019-07-06 ENCOUNTER — Encounter (HOSPITAL_COMMUNITY): Payer: Self-pay | Admitting: Hematology

## 2019-07-06 VITALS — BP 164/53 | HR 81 | Temp 97.8°F | Resp 18 | Wt 204.8 lb

## 2019-07-06 DIAGNOSIS — D649 Anemia, unspecified: Secondary | ICD-10-CM | POA: Diagnosis not present

## 2019-07-06 DIAGNOSIS — C221 Intrahepatic bile duct carcinoma: Secondary | ICD-10-CM | POA: Diagnosis not present

## 2019-07-06 DIAGNOSIS — F17210 Nicotine dependence, cigarettes, uncomplicated: Secondary | ICD-10-CM

## 2019-07-06 DIAGNOSIS — Z79899 Other long term (current) drug therapy: Secondary | ICD-10-CM

## 2019-07-06 DIAGNOSIS — I10 Essential (primary) hypertension: Secondary | ICD-10-CM

## 2019-07-06 DIAGNOSIS — E876 Hypokalemia: Secondary | ICD-10-CM | POA: Diagnosis not present

## 2019-07-06 DIAGNOSIS — R5383 Other fatigue: Secondary | ICD-10-CM | POA: Diagnosis not present

## 2019-07-06 DIAGNOSIS — C787 Secondary malignant neoplasm of liver and intrahepatic bile duct: Secondary | ICD-10-CM

## 2019-07-06 DIAGNOSIS — E785 Hyperlipidemia, unspecified: Secondary | ICD-10-CM

## 2019-07-06 LAB — COMPREHENSIVE METABOLIC PANEL
ALT: 8 U/L (ref 0–44)
AST: 16 U/L (ref 15–41)
Albumin: 3.8 g/dL (ref 3.5–5.0)
Alkaline Phosphatase: 108 U/L (ref 38–126)
Anion gap: 11 (ref 5–15)
BUN: 17 mg/dL (ref 8–23)
CO2: 23 mmol/L (ref 22–32)
Calcium: 9.1 mg/dL (ref 8.9–10.3)
Chloride: 103 mmol/L (ref 98–111)
Creatinine, Ser: 1.48 mg/dL — ABNORMAL HIGH (ref 0.44–1.00)
GFR calc Af Amer: 43 mL/min — ABNORMAL LOW (ref >60)
GFR calc non Af Amer: 37 mL/min — ABNORMAL LOW (ref >60)
Glucose, Bld: 186 mg/dL — ABNORMAL HIGH (ref 70–99)
Potassium: 3.1 mmol/L — ABNORMAL LOW (ref 3.5–5.1)
Sodium: 137 mmol/L (ref 135–145)
Total Bilirubin: 0.2 mg/dL — ABNORMAL LOW (ref 0.3–1.2)
Total Protein: 6.9 g/dL (ref 6.5–8.1)

## 2019-07-06 LAB — CBC WITH DIFFERENTIAL/PLATELET
Abs Immature Granulocytes: 0.2 10*3/uL — ABNORMAL HIGH (ref 0.00–0.07)
Basophils Absolute: 0 10*3/uL (ref 0.0–0.1)
Basophils Relative: 0 %
Eosinophils Absolute: 0.2 10*3/uL (ref 0.0–0.5)
Eosinophils Relative: 2 %
HCT: 24.7 % — ABNORMAL LOW (ref 36.0–46.0)
Hemoglobin: 8 g/dL — ABNORMAL LOW (ref 12.0–15.0)
Immature Granulocytes: 2 %
Lymphocytes Relative: 31 %
Lymphs Abs: 3.1 10*3/uL (ref 0.7–4.0)
MCH: 34.6 pg — ABNORMAL HIGH (ref 26.0–34.0)
MCHC: 32.4 g/dL (ref 30.0–36.0)
MCV: 106.9 fL — ABNORMAL HIGH (ref 80.0–100.0)
Monocytes Absolute: 0.7 10*3/uL (ref 0.1–1.0)
Monocytes Relative: 7 %
Neutro Abs: 5.6 10*3/uL (ref 1.7–7.7)
Neutrophils Relative %: 58 %
Platelets: 186 10*3/uL (ref 150–400)
RBC: 2.31 MIL/uL — ABNORMAL LOW (ref 3.87–5.11)
RDW: 16.5 % — ABNORMAL HIGH (ref 11.5–15.5)
WBC: 9.8 10*3/uL (ref 4.0–10.5)
nRBC: 0.2 % (ref 0.0–0.2)

## 2019-07-06 LAB — MAGNESIUM: Magnesium: 1.7 mg/dL (ref 1.7–2.4)

## 2019-07-06 MED ORDER — SODIUM CHLORIDE 0.9 % IV SOLN
Freq: Once | INTRAVENOUS | Status: AC
  Administered 2019-07-06: 09:00:00 via INTRAVENOUS
  Filled 2019-07-06: qty 1000

## 2019-07-06 MED ORDER — HEPARIN SOD (PORK) LOCK FLUSH 100 UNIT/ML IV SOLN
500.0000 [IU] | Freq: Once | INTRAVENOUS | Status: AC | PRN
  Administered 2019-07-06: 500 [IU]

## 2019-07-06 MED ORDER — SODIUM CHLORIDE 0.9% FLUSH
10.0000 mL | Freq: Once | INTRAVENOUS | Status: AC | PRN
  Administered 2019-07-06: 10 mL

## 2019-07-06 MED ORDER — AMLODIPINE BESYLATE 10 MG PO TABS: 10.0000 mg | ORAL_TABLET | Freq: Every day | ORAL | 6 refills | Status: DC

## 2019-07-06 NOTE — Patient Instructions (Signed)
[place] at [facility] Discharge Instructions  Received IV hydration with magnesium and potassium today. Chemo tx held. Follow-up as scheduled. Call clinic for any questions or concerns   Thank you for choosing [place] at [facility] to provide your oncology and hematology care.  To afford each patient quality time with our provider, please arrive at least 15 minutes before your scheduled appointment time.   If you have a lab appointment with the [facility] please come in thru the  Main Entrance and check in at the main information desk  You need to re-schedule your appointment should you arrive 10 or more minutes late.  We strive to give you quality time with our providers, and arriving late affects you and other patients whose appointments are after yours.  Also, if you no show three or more times for appointments you may be dismissed from the clinic at the providers discretion.     Again, thank you for choosing [facility].  Our hope is that these requests will decrease the amount of time that you wait before being seen by our physicians.       _____________________________________________________________  Should you have questions after your visit to [facility], please contact our office at [phone] between the hours of 8:00 a.m. and 4:30 p.m.  Voicemails left after 4:00 p.m. will not be returned until the following business day.  For prescription refill requests, have your pharmacy contact our office and allow 72 hours.    Cancer Center Support Programs:   > Cancer Support Group  2nd Tuesday of the month 1pm-2pm, Journey Room

## 2019-07-06 NOTE — Patient Instructions (Addendum)
[facility] at [facility] Discharge Instructions  You were seen today by Dr. Katragadda. He went over your recent lab results. He will see you back in 1 week for labs and follow up.   Thank you for choosing [facility] at [facility] to provide your oncology and hematology care.  To afford each patient quality time with our provider, please arrive at least 15 minutes before your scheduled appointment time.   If you have a lab appointment with the Cancer Center please come in thru the  Main Entrance and check in at the main information desk  You need to re-schedule your appointment should you arrive 10 or more minutes late.  We strive to give you quality time with our providers, and arriving late affects you and other patients whose appointments are after yours.  Also, if you no show three or more times for appointments you may be dismissed from the clinic at the providers discretion.     Again, thank you for choosing [facility].  Our hope is that these requests will decrease the amount of time that you wait before being seen by our physicians.       _____________________________________________________________  Should you have questions after your visit to [facility], please contact our office at [phone] between the hours of 8:00 a.m. and 4:30 p.m.  Voicemails left after 4:00 p.m. will not be returned until the following business day.  For prescription refill requests, have your pharmacy contact our office and allow 72 hours.    Cancer Center Support Programs:   > Cancer Support Group  2nd Tuesday of the month 1pm-2pm, Journey Room    

## 2019-07-06 NOTE — Assessment & Plan Note (Signed)
1.  Metastatic cholangiocarcinoma: -Foundation 1 CDX MS-stable, TMB- 3Muts/Mb, no other actionable mutations. -PET scan on 01/17/2019 shows hepatic hypermetabolism corresponding to gallbladder fundus with SUV of 9.1.  Focus of central right hepatic lobe hypermetabolism likely corresponds to the site of intrahepatic duct obstruction on the prior diagnostic CT with SUV of 8.3.  Hypermetabolism corresponding to the porta hepatis presumed nodal mass measuring 3.3 cm. -Core biopsy of the right liver mass consistent with well-differentiated carcinoma, positive for CK7 and CDX 2.  CK20 and TTF-1 are negative.  Differential diagnosis includes upper GI/pancreaticobiliary primary. - 6 cycles of gemcitabine and cisplatin from 02/22/2019 through 06/16/2019. -CT CAP after 3 cycles on 04/24/2019 compared to PET scan from 01/17/2019 did not show significant change in the index lesions in the liver and enlarged porta hepatis lymph nodes. - CEA improved to 9.9 (06/15/2019) from 44 (05/25/2019). - Today her creatinine has increased to 1.48.  We will hold her chemotherapy. -I have told her to discontinue her blood pressure medication which is a combination of amlodipine, valsartan and HCTZ.  I will send amlodipine 10 mg daily which she will take. -We will give her hydration today and tomorrow.  We will reevaluate her next week for start of cycle 7.  2.  Hypokalemia: - She will continue potassium 10 mEq 2 tablets twice daily.   3.  Normocytic anemia: -Hemoglobin today is 8.0.  This is from myelosuppression from chemotherapy.

## 2019-07-06 NOTE — Progress Notes (Signed)
0830 Labs reviewed with and pt seen by Dr. Delton Coombes and chemo tx to be held today with IV hydration including magnesium and potassium to be given per MD                      Nicole Ibarra tolerated IV hydration well without complaints or incident. Port left accessed,saline locked and flushed for use tomorrow. Pt discharged self ambulatory in satisfactory condition

## 2019-07-06 NOTE — Progress Notes (Signed)
Nicole Ibarra, [place] [id number]   CLINIC:  Medical Oncology/Hematology  PCP:  Cory Munch, PA-C [place] [phone]   REASON FOR VISIT: Follow-up for nausea and vomiting  CURRENT THERAPY: Cisplatin and gemcitabine.  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[place])   02/22/2019 -  Chemotherapy   The patient had palonosetron (ALOXI) injection 0.25 mg, 0.25 mg, Intravenous,  Once, 6 of 8 cycles Administration: 0.25 mg (02/22/2019), 0.25 mg (03/01/2019), 0.25 mg (03/15/2019), 0.25 mg (03/22/2019), 0.25 mg (04/06/2019), 0.25 mg (04/13/2019), 0.25 mg (04/27/2019), 0.25 mg (05/04/2019), 0.25 mg (05/18/2019), 0.25 mg (05/25/2019), 0.25 mg (06/16/2019), 0.25 mg (06/22/2019) pegfilgrastim-cbqv (UDENYCA) injection 6 mg, 6 mg, Subcutaneous, Once, 6 of 8 cycles Administration: 6 mg (03/02/2019), 6 mg (03/24/2019), 6 mg (05/05/2019), 6 mg (05/26/2019) CISplatin (PLATINOL) 52 mg in sodium chloride 0.9 % 250 mL chemo infusion, 25 mg/m2 = 52 mg, Intravenous,  Once, 6 of 8 cycles Administration: 52 mg (02/22/2019), 52 mg (03/01/2019), 52 mg (03/15/2019), 52 mg (03/22/2019), 52 mg (04/06/2019), 52 mg (04/13/2019), 52 mg (04/27/2019), 52 mg (05/04/2019), 52 mg (05/18/2019), 52 mg (05/25/2019), 52 mg (06/16/2019), 52 mg (06/22/2019) gemcitabine (GEMZAR) 2,000 mg in sodium chloride 0.9 % 250 mL chemo infusion, 2,090 mg, Intravenous,  Once, 6 of 8 cycles Dose modification: 750 mg/m2 (75 % of original dose 1,000 mg/m2, Cycle 4, Reason: Other (see comments), Comment: neutropenia) Administration: 2,000 mg (02/22/2019), 2,000 mg (03/01/2019), 2,000 mg (03/15/2019), 2,000 mg (03/22/2019), 2,000 mg (04/06/2019), 2,000 mg (04/13/2019), 2,000 mg (04/27/2019), 1,558 mg (05/04/2019), 2,000 mg (05/18/2019), 2,000 mg (05/25/2019), 2,000 mg (06/16/2019), 2,000 mg  (06/22/2019) fosaprepitant (EMEND) 150 mg, dexamethasone (DECADRON) 12 mg in sodium chloride 0.9 % 145 mL IVPB, , Intravenous,  Once, 6 of 8 cycles Administration:  (02/22/2019),  (03/01/2019),  (03/15/2019),  (03/22/2019),  (04/06/2019),  (04/13/2019),  (04/27/2019),  (05/04/2019),  (05/18/2019),  (05/25/2019),  (06/16/2019),  (06/22/2019)  for chemotherapy treatment.       INTERVAL HISTORY:  Nicole Ibarra 63 y.o. female seen for follow-up of metastatic cholangiocarcinoma and cycle 7 of chemotherapy.  Cycle 6 was given on 06/16/2019 with day 8 on 06/22/2019.  Today appetite is reported as 75% and energy levels are 75%.  Denies any nausea vomiting diarrhea or constipation.  Denies any bleeding per rectum or melena.  Denies any fevers or night sweats.  No tingling or numbness in extremities reported.  No ringing in the ears or hearing loss.    REVIEW OF SYSTEMS:  Review of Systems  All other systems reviewed and are negative.    PAST MEDICAL/SURGICAL HISTORY:  Past Medical History:  Diagnosis Date  . Anxiety   . Arthritis   . Depression   . Hypertension    Past Surgical History:  Procedure Laterality Date  . COLONOSCOPY N/A 10/23/2016   Procedure: COLONOSCOPY;  Surgeon: Danie Binder, MD;  Location: [facility];  Service: Endoscopy;  Laterality: N/A;  11:30 Am  . POLYPECTOMY  10/23/2016   Procedure: POLYPECTOMY;  Surgeon: Danie Binder, MD;  Location: [facility];  Service: Endoscopy;;  sigmoid colon polyp  . PORTACATH PLACEMENT Left 02/16/2019   Procedure: INSERTION PORT-A-CATH (attached catheter in left subclavian);  Surgeon: Virl Cagey, MD;  Location: [facility]S;  Service: General;  Laterality: Left;  . RT HIP SURGERY    . TOTAL HIP  ARTHROPLASTY Left 04/07/2016   Procedure: LEFT TOTAL HIP ARTHROPLASTY ANTERIOR APPROACH;  Surgeon: Paralee Cancel, MD;  Location: [facility];  Service: Orthopedics;  Laterality: Left;  Unsuccessful for Spinal, went to General     SOCIAL HISTORY:  Social History    Socioeconomic History  . Marital status: Single    Spouse name: Not on file  . Number of children: Not on file  . Years of education: Not on file  . Highest education level: Not on file  Occupational History  . Not on file  Social Needs  . Financial resource strain: Not hard at all  . Food insecurity    Worry: Never true    Inability: Never true  . Transportation needs    Medical: No    Non-medical: No  Tobacco Use  . Smoking status: Current Every Day Smoker    Packs/day: 0.25    Years: 42.00    Pack years: 10.50  . Smokeless tobacco: Never Used  Substance and Sexual Activity  . Alcohol use: No  . Drug use: No  . Sexual activity: Not Currently  Lifestyle  . Physical activity    Days per week: 0 days    Minutes per session: 0 min  . Stress: Only a little  Relationships  . Social Herbalist on phone: Three times a week    Gets together: Three times a week    Attends religious service: More than 4 times per year    Active member of club or organization: Yes    Attends meetings of clubs or organizations: More than 4 times per year    Relationship status: Never married  . Intimate partner violence    Fear of current or ex partner: No    Emotionally abused: No    Physically abused: No    Forced sexual activity: No  Other Topics Concern  . Not on file  Social History Narrative  . Not on file    FAMILY HISTORY:  Family History  Problem Relation Age of Onset  . Hypertension Mother   . Cancer Father   . Hypertension Brother   . Cancer Brother   . Hypertension Sister   . Cancer Sister     CURRENT MEDICATIONS:  Outpatient Encounter Medications as of 07/06/2019  Medication Sig  . amLODipine (NORVASC) 10 MG tablet Take 1 tablet (10 mg total) by mouth daily.  Marland Kitchen atorvastatin (LIPITOR) 20 MG tablet   . CISPLATIN IV Inject into the vein. Day 1, 8 every 21 days  . doxazosin (CARDURA) 2 MG tablet Take 2 mg by mouth every evening.   Marland Kitchen GEMCITABINE HCL IV  Inject into the vein. Day 1, 8 every 21 days  . HYDROcodone-acetaminophen (NORCO) 5-325 MG tablet Take 1 tablet by mouth every 4 (four) hours as needed for moderate pain.  Marland Kitchen KLOR-CON M20 20 MEQ tablet TAKE 1 TABLET BY MOUTH TWICE A DAY  . lidocaine (XYLOCAINE) 2 % solution Swish and swallow 1 tablespoon four times a day prn sore mouth  . lidocaine-prilocaine (EMLA) cream Apply to skin over port a cath one hour prior to chemotherapy appointment  . naproxen sodium (ALEVE) 220 MG tablet Take 220 mg by mouth 2 (two) times daily as needed.   . potassium chloride (K-DUR) 10 MEQ tablet Take 2 tablets (20 mEq total) by mouth 2 (two) times daily.  . prochlorperazine (COMPAZINE) 10 MG tablet Take 1 tablet (10 mg total) by mouth every 6 (six) hours as needed (  Nausea or vomiting).  . vitamin B-12 (CYANOCOBALAMIN) 50 MCG tablet Take 50 mcg by mouth daily.  . [DISCONTINUED] amLODIPine-Valsartan-HCTZ 32-992-42 MG TABS Take 1 tablet by mouth daily.   No facility-administered encounter medications on file as of 07/06/2019.     ALLERGIES:  No Known Allergies   PHYSICAL EXAM:  ECOG Performance status: 1 Blood pressure is 164/53, pulse rate is 79, respiratory is 18, temperature 98. There were no vitals filed for this visit. There were no vitals filed for this visit.  Physical Exam Constitutional:      Appearance: Normal appearance. She is normal weight.  Cardiovascular:     Rate and Rhythm: Normal rate and regular rhythm.     Heart sounds: Normal heart sounds.  Pulmonary:     Effort: Pulmonary effort is normal.     Breath sounds: Normal breath sounds.  Abdominal:     General: Bowel sounds are normal.     Palpations: Abdomen is soft.  Musculoskeletal: Normal range of motion.  Skin:    General: Skin is warm and dry.  Neurological:     Mental Status: She is alert and oriented to person, place, and time. Mental status is at baseline.  Psychiatric:        Mood and Affect: Mood normal.         Behavior: Behavior normal.        Thought Content: Thought content normal.        Judgment: Judgment normal.      LABORATORY DATA:  I have reviewed the labs as listed.  CBC    Component Value Date/Time   WBC 9.8 07/06/2019 0749   RBC 2.31 (L) 07/06/2019 0749   HGB 8.0 (L) 07/06/2019 0749   HCT 24.7 (L) 07/06/2019 0749   PLT 186 07/06/2019 0749   MCV 106.9 (H) 07/06/2019 0749   MCH 34.6 (H) 07/06/2019 0749   MCHC 32.4 07/06/2019 0749   RDW 16.5 (H) 07/06/2019 0749   LYMPHSABS 3.1 07/06/2019 0749   MONOABS 0.7 07/06/2019 0749   EOSABS 0.2 07/06/2019 0749   BASOSABS 0.0 07/06/2019 0749   CMP Latest Ref Rng & Units 07/06/2019 06/22/2019 06/16/2019  Glucose 70 - 99 mg/dL 186(H) 103(H) 105(H)  BUN 8 - 23 mg/dL 17 33(H) 20  Creatinine 0.44 - 1.00 mg/dL 1.48(H) 1.34(H) 1.27(H)  Sodium 135 - 145 mmol/L 137 136 135  Potassium 3.5 - 5.1 mmol/L 3.1(L) 3.7 3.6  Chloride 98 - 111 mmol/L 103 101 103  CO2 22 - 32 mmol/L '23 25 23  '$ Calcium 8.9 - 10.3 mg/dL 9.1 9.3 9.0  Total Protein 6.5 - 8.1 g/dL 6.9 6.8 6.9  Total Bilirubin 0.3 - 1.2 mg/dL 0.2(L) 0.3 0.4  Alkaline Phos 38 - 126 U/L 108 85 94  AST 15 - 41 U/L 16 14(L) 14(L)  ALT 0 - 44 U/L '8 13 9      '$ All questions were answered to patient's stated satisfaction. Encouraged patient to call with any new concerns or questions before his next visit to the cancer center and we can certain see him sooner, if needed.     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  Metastatic cholangiocarcinoma: -Foundation 1 CDX MS-stable, TMB- 3Muts/Mb, no other actionable mutations. -PET scan on 01/17/2019 shows hepatic hypermetabolism corresponding to gallbladder fundus with SUV of 9.1.  Focus of central right hepatic lobe hypermetabolism likely corresponds to the site of intrahepatic duct obstruction on the prior diagnostic CT with SUV of 8.3.  Hypermetabolism corresponding to the porta hepatis presumed nodal mass measuring 3.3  cm. -Core biopsy of the right liver mass consistent with well-differentiated carcinoma, positive for CK7 and CDX 2.  CK20 and TTF-1 are negative.  Differential diagnosis includes upper GI/pancreaticobiliary primary. - 6 cycles of gemcitabine and cisplatin from 02/22/2019 through 06/16/2019. -CT CAP after 3 cycles on 04/24/2019 compared to PET scan from 01/17/2019 did not show significant change in the index lesions in the liver and enlarged porta hepatis lymph nodes. - CEA improved to 9.9 (06/15/2019) from 44 (05/25/2019). - Today her creatinine has increased to 1.48.  We will hold her chemotherapy. -I have told her to discontinue her blood pressure medication which is a combination of amlodipine, valsartan and HCTZ.  I will send amlodipine 10 mg daily which she will take. -We will give her hydration today and tomorrow.  We will reevaluate her next week for start of cycle 7.  2.  Hypokalemia: - She will continue potassium 10 mEq 2 tablets twice daily.   3.  Normocytic anemia: -Hemoglobin today is 8.0.  This is from myelosuppression from chemotherapy.   Total time spent is 25 minutes with more than 50% of the time spent face-to-face discussing treatment plan, counseling and coordination of care.  Orders placed this encounter:  No orders of the defined types were placed in this encounter.     Derek Jack, MD [place] [phone]

## 2019-07-06 NOTE — Patient Instructions (Signed)
[facility] at [facility] Discharge Instructions  Labs drawn from portacath today   Thank you for choosing [facility] at [facility] to provide your oncology and hematology care.  To afford each patient quality time with our provider, please arrive at least 15 minutes before your scheduled appointment time.   If you have a lab appointment with the Cancer Center please come in thru the  Main Entrance and check in at the main information desk  You need to re-schedule your appointment should you arrive 10 or more minutes late.  We strive to give you quality time with our providers, and arriving late affects you and other patients whose appointments are after yours.  Also, if you no show three or more times for appointments you may be dismissed from the clinic at the providers discretion.     Again, thank you for choosing [facility].  Our hope is that these requests will decrease the amount of time that you wait before being seen by our physicians.       _____________________________________________________________  Should you have questions after your visit to [facility], please contact our office at [phone] between the hours of 8:00 a.m. and 4:30 p.m.  Voicemails left after 4:00 p.m. will not be returned until the following business day.  For prescription refill requests, have your pharmacy contact our office and allow 72 hours.    Cancer Center Support Programs:   > Cancer Support Group  2nd Tuesday of the month 1pm-2pm, Journey Room   

## 2019-07-07 ENCOUNTER — Inpatient Hospital Stay (HOSPITAL_COMMUNITY): Payer: Medicare Other

## 2019-07-07 ENCOUNTER — Encounter (HOSPITAL_COMMUNITY): Payer: Self-pay

## 2019-07-07 ENCOUNTER — Other Ambulatory Visit: Payer: Self-pay

## 2019-07-07 VITALS — BP 181/62 | HR 65 | Temp 97.6°F | Resp 18 | Wt 207.6 lb

## 2019-07-07 DIAGNOSIS — C221 Intrahepatic bile duct carcinoma: Secondary | ICD-10-CM

## 2019-07-07 LAB — CANCER ANTIGEN 19-9: CA 19-9: 1 U/mL (ref 0–35)

## 2019-07-07 MED ORDER — SODIUM CHLORIDE 0.9 % IV SOLN
Freq: Once | INTRAVENOUS | Status: AC
  Administered 2019-07-07: 09:00:00 via INTRAVENOUS
  Filled 2019-07-07: qty 1000

## 2019-07-07 MED ORDER — HEPARIN SOD (PORK) LOCK FLUSH 100 UNIT/ML IV SOLN
500.0000 [IU] | Freq: Once | INTRAVENOUS | Status: AC | PRN
  Administered 2019-07-07: 500 [IU]

## 2019-07-07 MED ORDER — SODIUM CHLORIDE 0.9% FLUSH
10.0000 mL | Freq: Once | INTRAVENOUS | Status: AC | PRN
  Administered 2019-07-07: 10 mL

## 2019-07-07 MED ORDER — SODIUM CHLORIDE 0.9% FLUSH
10.0000 mL | Freq: Once | INTRAVENOUS | Status: AC
  Administered 2019-07-07: 10 mL

## 2019-07-07 NOTE — Patient Instructions (Signed)
[facility] at [facility]  Discharge Instructions:   _______________________________________________________________  Thank you for choosing [facility] at [facility] to provide your oncology and hematology care.  To afford each patient quality time with our providers, please arrive at least 15 minutes before your scheduled appointment.  You need to re-schedule your appointment if you arrive 10 or more minutes late.  We strive to give you quality time with our providers, and arriving late affects you and other patients whose appointments are after yours.  Also, if you no show three or more times for appointments you may be dismissed from the clinic.  Again, thank you for choosing [facility] at [facility]. Our hope is that these requests will allow you access to exceptional care and in a timely manner. _______________________________________________________________  If you have questions after your visit, please contact our office at [phone] between the hours of 8:30 a.m. and 5:00 p.m. Voicemails left after 4:30 p.m. will not be returned until the following business day. _______________________________________________________________  For prescription refill requests, have your pharmacy contact our office. _______________________________________________________________  Recommendations made by the consultant and any test results will be sent to your referring physician. _______________________________________________________________ 

## 2019-07-07 NOTE — Progress Notes (Signed)
Pt presents today for fluids only. BP elevated. Pt states Dr. Delton Coombes changed her BP meds on 07/06/19. Pt instructed to pick medications up today from the pharmacy. No complaints of any pain. Pt denies any blurry vision or headache at this time.   IV fluids given today per MD orders. Vital signs stable. BP elevated No complaints at this time. Discharged from clinic ambulatory. F/U with [facility] as scheduled.

## 2019-07-13 ENCOUNTER — Inpatient Hospital Stay (HOSPITAL_BASED_OUTPATIENT_CLINIC_OR_DEPARTMENT_OTHER): Payer: Medicare Other | Admitting: Hematology

## 2019-07-13 ENCOUNTER — Inpatient Hospital Stay (HOSPITAL_COMMUNITY): Payer: Medicare Other | Attending: Hematology

## 2019-07-13 ENCOUNTER — Inpatient Hospital Stay (HOSPITAL_COMMUNITY): Payer: Medicare Other

## 2019-07-13 ENCOUNTER — Other Ambulatory Visit: Payer: Self-pay

## 2019-07-13 ENCOUNTER — Encounter (HOSPITAL_COMMUNITY): Payer: Self-pay | Admitting: Hematology

## 2019-07-13 VITALS — BP 152/66 | HR 66 | Temp 97.1°F | Resp 18

## 2019-07-13 DIAGNOSIS — Z5189 Encounter for other specified aftercare: Secondary | ICD-10-CM | POA: Diagnosis not present

## 2019-07-13 DIAGNOSIS — C787 Secondary malignant neoplasm of liver and intrahepatic bile duct: Secondary | ICD-10-CM | POA: Diagnosis not present

## 2019-07-13 DIAGNOSIS — Z5111 Encounter for antineoplastic chemotherapy: Secondary | ICD-10-CM | POA: Insufficient documentation

## 2019-07-13 DIAGNOSIS — Z809 Family history of malignant neoplasm, unspecified: Secondary | ICD-10-CM | POA: Insufficient documentation

## 2019-07-13 DIAGNOSIS — D649 Anemia, unspecified: Secondary | ICD-10-CM | POA: Insufficient documentation

## 2019-07-13 DIAGNOSIS — C221 Intrahepatic bile duct carcinoma: Secondary | ICD-10-CM

## 2019-07-13 DIAGNOSIS — F17210 Nicotine dependence, cigarettes, uncomplicated: Secondary | ICD-10-CM | POA: Insufficient documentation

## 2019-07-13 DIAGNOSIS — R7989 Other specified abnormal findings of blood chemistry: Secondary | ICD-10-CM | POA: Diagnosis not present

## 2019-07-13 DIAGNOSIS — R200 Anesthesia of skin: Secondary | ICD-10-CM | POA: Diagnosis not present

## 2019-07-13 DIAGNOSIS — Z79899 Other long term (current) drug therapy: Secondary | ICD-10-CM | POA: Insufficient documentation

## 2019-07-13 DIAGNOSIS — E876 Hypokalemia: Secondary | ICD-10-CM | POA: Diagnosis not present

## 2019-07-13 DIAGNOSIS — K5900 Constipation, unspecified: Secondary | ICD-10-CM | POA: Diagnosis not present

## 2019-07-13 DIAGNOSIS — Z8249 Family history of ischemic heart disease and other diseases of the circulatory system: Secondary | ICD-10-CM | POA: Diagnosis not present

## 2019-07-13 DIAGNOSIS — M1990 Unspecified osteoarthritis, unspecified site: Secondary | ICD-10-CM | POA: Insufficient documentation

## 2019-07-13 LAB — CBC WITH DIFFERENTIAL/PLATELET
Abs Immature Granulocytes: 0.08 10*3/uL — ABNORMAL HIGH (ref 0.00–0.07)
Basophils Absolute: 0 10*3/uL (ref 0.0–0.1)
Basophils Relative: 1 %
Eosinophils Absolute: 0.2 10*3/uL (ref 0.0–0.5)
Eosinophils Relative: 2 %
HCT: 25.7 % — ABNORMAL LOW (ref 36.0–46.0)
Hemoglobin: 8.4 g/dL — ABNORMAL LOW (ref 12.0–15.0)
Immature Granulocytes: 1 %
Lymphocytes Relative: 29 %
Lymphs Abs: 2.3 10*3/uL (ref 0.7–4.0)
MCH: 34.3 pg — ABNORMAL HIGH (ref 26.0–34.0)
MCHC: 32.7 g/dL (ref 30.0–36.0)
MCV: 104.9 fL — ABNORMAL HIGH (ref 80.0–100.0)
Monocytes Absolute: 1.1 10*3/uL — ABNORMAL HIGH (ref 0.1–1.0)
Monocytes Relative: 14 %
Neutro Abs: 4.2 10*3/uL (ref 1.7–7.7)
Neutrophils Relative %: 53 %
Platelets: 252 10*3/uL (ref 150–400)
RBC: 2.45 MIL/uL — ABNORMAL LOW (ref 3.87–5.11)
RDW: 15.9 % — ABNORMAL HIGH (ref 11.5–15.5)
WBC: 7.8 10*3/uL (ref 4.0–10.5)
nRBC: 0 % (ref 0.0–0.2)

## 2019-07-13 LAB — COMPREHENSIVE METABOLIC PANEL
ALT: 8 U/L (ref 0–44)
AST: 14 U/L — ABNORMAL LOW (ref 15–41)
Albumin: 3.7 g/dL (ref 3.5–5.0)
Alkaline Phosphatase: 99 U/L (ref 38–126)
Anion gap: 7 (ref 5–15)
BUN: 18 mg/dL (ref 8–23)
CO2: 24 mmol/L (ref 22–32)
Calcium: 9.2 mg/dL (ref 8.9–10.3)
Chloride: 105 mmol/L (ref 98–111)
Creatinine, Ser: 1.16 mg/dL — ABNORMAL HIGH (ref 0.44–1.00)
GFR calc Af Amer: 58 mL/min — ABNORMAL LOW (ref >60)
GFR calc non Af Amer: 50 mL/min — ABNORMAL LOW (ref >60)
Glucose, Bld: 99 mg/dL (ref 70–99)
Potassium: 3.6 mmol/L (ref 3.5–5.1)
Sodium: 136 mmol/L (ref 135–145)
Total Bilirubin: 0.4 mg/dL (ref 0.3–1.2)
Total Protein: 7 g/dL (ref 6.5–8.1)

## 2019-07-13 LAB — MAGNESIUM: Magnesium: 1.8 mg/dL (ref 1.7–2.4)

## 2019-07-13 MED ORDER — SODIUM CHLORIDE 0.9 % IV SOLN
25.0000 mg/m2 | Freq: Once | INTRAVENOUS | Status: AC
  Administered 2019-07-13: 13:00:00 52 mg via INTRAVENOUS
  Filled 2019-07-13: qty 52

## 2019-07-13 MED ORDER — POTASSIUM CHLORIDE 2 MEQ/ML IV SOLN
Freq: Once | INTRAVENOUS | Status: AC
  Administered 2019-07-13: 09:00:00 via INTRAVENOUS
  Filled 2019-07-13: qty 10

## 2019-07-13 MED ORDER — SODIUM CHLORIDE 0.9 % IV SOLN
Freq: Once | INTRAVENOUS | Status: AC
  Administered 2019-07-13: 11:00:00 via INTRAVENOUS
  Filled 2019-07-13: qty 5

## 2019-07-13 MED ORDER — HEPARIN SOD (PORK) LOCK FLUSH 100 UNIT/ML IV SOLN
500.0000 [IU] | Freq: Once | INTRAVENOUS | Status: AC | PRN
  Administered 2019-07-13: 15:00:00 500 [IU]

## 2019-07-13 MED ORDER — SODIUM CHLORIDE 0.9 % IV SOLN
Freq: Once | INTRAVENOUS | Status: AC
  Administered 2019-07-13: 09:00:00 via INTRAVENOUS

## 2019-07-13 MED ORDER — SODIUM CHLORIDE 0.9 % IV SOLN
2000.0000 mg | Freq: Once | INTRAVENOUS | Status: AC
  Administered 2019-07-13: 2000 mg via INTRAVENOUS
  Filled 2019-07-13: qty 52.6

## 2019-07-13 MED ORDER — SODIUM CHLORIDE 0.9% FLUSH
10.0000 mL | INTRAVENOUS | Status: DC | PRN
  Administered 2019-07-13: 10 mL
  Filled 2019-07-13: qty 10

## 2019-07-13 MED ORDER — PALONOSETRON HCL INJECTION 0.25 MG/5ML
0.2500 mg | Freq: Once | INTRAVENOUS | Status: AC
  Administered 2019-07-13: 0.25 mg via INTRAVENOUS
  Filled 2019-07-13: qty 5

## 2019-07-13 NOTE — Progress Notes (Signed)
[place], [place] [address]   CLINIC:  Medical Oncology/Hematology  PCP:  Cory Munch, PA-C [place] [address] [phone]   REASON FOR VISIT: Follow-up for nausea and vomiting  CURRENT THERAPY: Cisplatin and gemcitabine.  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[place])   02/22/2019 -  Chemotherapy   The patient had palonosetron (ALOXI) injection 0.25 mg, 0.25 mg, Intravenous,  Once, 7 of 8 cycles Administration: 0.25 mg (02/22/2019), 0.25 mg (03/01/2019), 0.25 mg (03/15/2019), 0.25 mg (03/22/2019), 0.25 mg (04/06/2019), 0.25 mg (04/13/2019), 0.25 mg (04/27/2019), 0.25 mg (05/04/2019), 0.25 mg (05/18/2019), 0.25 mg (05/25/2019), 0.25 mg (06/16/2019), 0.25 mg (06/22/2019), 0.25 mg (07/13/2019) pegfilgrastim-cbqv (UDENYCA) injection 6 mg, 6 mg, Subcutaneous, Once, 7 of 8 cycles Administration: 6 mg (03/02/2019), 6 mg (03/24/2019), 6 mg (05/05/2019), 6 mg (05/26/2019), 6 mg (06/23/2019) CISplatin (PLATINOL) 52 mg in sodium chloride 0.9 % 250 mL chemo infusion, 25 mg/m2 = 52 mg, Intravenous,  Once, 7 of 8 cycles Administration: 52 mg (02/22/2019), 52 mg (03/01/2019), 52 mg (03/15/2019), 52 mg (03/22/2019), 52 mg (04/06/2019), 52 mg (04/13/2019), 52 mg (04/27/2019), 52 mg (05/04/2019), 52 mg (05/18/2019), 52 mg (05/25/2019), 52 mg (06/16/2019), 52 mg (06/22/2019), 52 mg (07/13/2019) gemcitabine (GEMZAR) 2,000 mg in sodium chloride 0.9 % 250 mL chemo infusion, 2,090 mg, Intravenous,  Once, 7 of 8 cycles Dose modification: 750 mg/m2 (75 % of original dose 1,000 mg/m2, Cycle 4, Reason: Other (see comments), Comment: neutropenia) Administration: 2,000 mg (02/22/2019), 2,000 mg (03/01/2019), 2,000 mg (03/15/2019), 2,000 mg (03/22/2019), 2,000 mg (04/06/2019), 2,000 mg (04/13/2019), 2,000 mg (04/27/2019), 1,558 mg (05/04/2019), 2,000 mg (05/18/2019), 2,000  mg (05/25/2019), 2,000 mg (06/16/2019), 2,000 mg (06/22/2019), 2,000 mg (07/13/2019) fosaprepitant (EMEND) 150 mg, dexamethasone (DECADRON) 12 mg in sodium chloride 0.9 % 145 mL IVPB, , Intravenous,  Once, 7 of 8 cycles Administration:  (02/22/2019),  (03/01/2019),  (03/15/2019),  (03/22/2019),  (04/06/2019),  (04/13/2019),  (04/27/2019),  (05/04/2019),  (05/18/2019),  (05/25/2019),  (06/16/2019),  (06/22/2019),  (07/13/2019)  for chemotherapy treatment.       INTERVAL HISTORY:  Ms. Cordner 63 y.o. female seen for follow-up of metastatic cholangiocarcinoma and cycle 7 of chemotherapy.  Cycle 6 was on 06/16/2019.  Appetite is 75%.  Energy levels are 50%.  Numbness in the hands comes and goes.  No pain was reported.  Had mild constipation.  Last bowel movement was Sunday.  She had to take MiraLAX to have bowel movements.  Denies any fevers or chills.  Denies any ER visits or hospitalizations.  No ringing in the ears reported.  REVIEW OF SYSTEMS:  Review of Systems  Gastrointestinal: Positive for constipation.  Neurological: Positive for numbness.  All other systems reviewed and are negative.    PAST MEDICAL/SURGICAL HISTORY:  Past Medical History:  Diagnosis Date  . Anxiety   . Arthritis   . Depression   . Hypertension    Past Surgical History:  Procedure Laterality Date  . COLONOSCOPY N/A 10/23/2016   Procedure: COLONOSCOPY;  Surgeon: Danie Binder, MD;  Location: [facility];  Service: Endoscopy;  Laterality: N/A;  11:30 Am  . POLYPECTOMY  10/23/2016   Procedure: POLYPECTOMY;  Surgeon: Danie Binder, MD;  Location: [facility];  Service: Endoscopy;;  sigmoid colon polyp  . PORTACATH PLACEMENT Left 02/16/2019   Procedure: INSERTION PORT-A-CATH (attached catheter in left subclavian);  Surgeon: Virl Cagey, MD;  Location: [facility] ORS;  Service: General;  Laterality: Left;  . RT HIP SURGERY    . TOTAL HIP ARTHROPLASTY Left 04/07/2016   Procedure: LEFT TOTAL HIP ARTHROPLASTY ANTERIOR APPROACH;   Surgeon: Paralee Cancel, MD;  Location: [facility];  Service: Orthopedics;  Laterality: Left;  Unsuccessful for Spinal, went to General     SOCIAL HISTORY:  Social History   Socioeconomic History  . Marital status: Single    Spouse name: Not on file  . Number of children: Not on file  . Years of education: Not on file  . Highest education level: Not on file  Occupational History  . Not on file  Social Needs  . Financial resource strain: Not hard at all  . Food insecurity    Worry: Never true    Inability: Never true  . Transportation needs    Medical: No    Non-medical: No  Tobacco Use  . Smoking status: Current Every Day Smoker    Packs/day: 0.25    Years: 42.00    Pack years: 10.50  . Smokeless tobacco: Never Used  Substance and Sexual Activity  . Alcohol use: No  . Drug use: No  . Sexual activity: Not Currently  Lifestyle  . Physical activity    Days per week: 0 days    Minutes per session: 0 min  . Stress: Only a little  Relationships  . Social Herbalist on phone: Three times a week    Gets together: Three times a week    Attends religious service: More than 4 times per year    Active member of club or organization: Yes    Attends meetings of clubs or organizations: More than 4 times per year    Relationship status: Never married  . Intimate partner violence    Fear of current or ex partner: No    Emotionally abused: No    Physically abused: No    Forced sexual activity: No  Other Topics Concern  . Not on file  Social History Narrative  . Not on file    FAMILY HISTORY:  Family History  Problem Relation Age of Onset  . Hypertension Mother   . Cancer Father   . Hypertension Brother   . Cancer Brother   . Hypertension Sister   . Cancer Sister     CURRENT MEDICATIONS:  Outpatient Encounter Medications as of 07/13/2019  Medication Sig  . amLODipine (NORVASC) 10 MG tablet Take 1 tablet (10 mg total) by mouth daily.  Marland Kitchen atorvastatin (LIPITOR) 20  MG tablet Take 20 mg by mouth daily at 6 PM.   . CISPLATIN IV Inject into the vein. Day 1, 8 every 21 days  . doxazosin (CARDURA) 2 MG tablet Take 2 mg by mouth every evening.   Marland Kitchen GEMCITABINE HCL IV Inject into the vein. Day 1, 8 every 21 days  . HYDROcodone-acetaminophen (NORCO) 5-325 MG tablet Take 1 tablet by mouth every 4 (four) hours as needed for moderate pain.  Marland Kitchen KLOR-CON M20 20 MEQ tablet TAKE 1 TABLET BY MOUTH TWICE A DAY  . lidocaine (XYLOCAINE) 2 % solution Swish and swallow 1 tablespoon four times a day prn sore mouth  . lidocaine-prilocaine (EMLA) cream Apply to skin over port a cath one hour prior to chemotherapy appointment  . naproxen sodium (ALEVE) 220 MG tablet Take 220 mg by mouth 2 (two) times daily as needed.   . potassium chloride (K-DUR) 10  MEQ tablet Take 2 tablets (20 mEq total) by mouth 2 (two) times daily.  . prochlorperazine (COMPAZINE) 10 MG tablet Take 1 tablet (10 mg total) by mouth every 6 (six) hours as needed (Nausea or vomiting).  . vitamin B-12 (CYANOCOBALAMIN) 50 MCG tablet Take 50 mcg by mouth daily.   No facility-administered encounter medications on file as of 07/13/2019.     ALLERGIES:  No Known Allergies   PHYSICAL EXAM:  ECOG Performance status: 1 Blood pressure is 164/53, pulse rate is 79, respiratory is 18, temperature 98. Vitals:   07/13/19 0802  BP: (!) 157/57  Pulse: 62  Resp: 16  Temp: (!) 97.1 F (36.2 C)  SpO2: 100%   Filed Weights   07/13/19 0802  Weight: 205 lb 14.4 oz (93.4 kg)    Physical Exam Constitutional:      Appearance: Normal appearance. She is normal weight.  Cardiovascular:     Rate and Rhythm: Normal rate and regular rhythm.     Heart sounds: Normal heart sounds.  Pulmonary:     Effort: Pulmonary effort is normal.     Breath sounds: Normal breath sounds.  Abdominal:     General: Bowel sounds are normal.     Palpations: Abdomen is soft.  Musculoskeletal: Normal range of motion.  Skin:    General: Skin is  warm and dry.  Neurological:     Mental Status: She is alert and oriented to person, place, and time. Mental status is at baseline.  Psychiatric:        Mood and Affect: Mood normal.        Behavior: Behavior normal.        Thought Content: Thought content normal.        Judgment: Judgment normal.      LABORATORY DATA:  I have reviewed the labs as listed.  CBC    Component Value Date/Time   WBC 7.8 07/13/2019 0815   RBC 2.45 (L) 07/13/2019 0815   HGB 8.4 (L) 07/13/2019 0815   HCT 25.7 (L) 07/13/2019 0815   PLT 252 07/13/2019 0815   MCV 104.9 (H) 07/13/2019 0815   MCH 34.3 (H) 07/13/2019 0815   MCHC 32.7 07/13/2019 0815   RDW 15.9 (H) 07/13/2019 0815   LYMPHSABS 2.3 07/13/2019 0815   MONOABS 1.1 (H) 07/13/2019 0815   EOSABS 0.2 07/13/2019 0815   BASOSABS 0.0 07/13/2019 0815   CMP Latest Ref Rng & Units 07/13/2019 07/06/2019 06/22/2019  Glucose 70 - 99 mg/dL 99 186(H) 103(H)  BUN 8 - 23 mg/dL 18 17 33(H)  Creatinine 0.44 - 1.00 mg/dL 1.16(H) 1.48(H) 1.34(H)  Sodium 135 - 145 mmol/L 136 137 136  Potassium 3.5 - 5.1 mmol/L 3.6 3.1(L) 3.7  Chloride 98 - 111 mmol/L 105 103 101  CO2 22 - 32 mmol/L 24 23 25   Calcium 8.9 - 10.3 mg/dL 9.2 9.1 9.3  Total Protein 6.5 - 8.1 g/dL 7.0 6.9 6.8  Total Bilirubin 0.3 - 1.2 mg/dL 0.4 0.2(L) 0.3  Alkaline Phos 38 - 126 U/L 99 108 85  AST 15 - 41 U/L 14(L) 16 14(L)  ALT 0 - 44 U/L 8 8 13       All questions were answered to patient's stated satisfaction. Encouraged patient to call with any new concerns or questions before his next visit to the cancer center and we can certain see him sooner, if needed.     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  Metastatic cholangiocarcinoma: -Foundation  1 CDX MS-stable, no other actionable mutations. -6 cycles of gemcitabine and cisplatin from 02/22/2019 through 06/16/2019. -CT CAP after 3 cycles on 04/24/2019 compared to PET scan from 01/17/2019 did not show any significant  change in the index lesions in the liver and enlarged porta hepatis lymph nodes. -CEA improved to 9.9 (06/15/2019) from 44 (05/25/2019). - Chemotherapy last week was held due to elevated creatinine of 1.48. -She is feeling much better this week.  We have reviewed her blood work.  Creatinine improved to 1.16. - She will proceed with her cycle 7 today.  We will see her back in 1 week for follow-up.  She was encouraged to drink lots of fluids.  I plan to repeat scans after this cycle.  2.  Hypokalemia: - She is taking potassium 10 mEq 2 tablets twice daily.  Potassium is normal.  3.  Normocytic anemia: -Hemoglobin today is 8.4.  This is from myelosuppression from chemotherapy.   Total time spent is 25 minutes with more than 50% of the time spent face-to-face discussing treatment plan, counseling and coordination of care.  Orders placed this encounter:  No orders of the defined types were placed in this encounter.     Derek Jack, MD [place] [phone]

## 2019-07-13 NOTE — Progress Notes (Signed)
Labs reviewed with MD. Proceed with treatment per MD  Treatment given per orders. Patient tolerated it well without problems. Vitals stable and discharged home from clinic ambulatory. Follow up as scheduled.  

## 2019-07-13 NOTE — Patient Instructions (Signed)
[facility] Discharge Instructions for Patients Receiving Chemotherapy  Today you received the following chemotherapy agents   To help prevent nausea and vomiting after your treatment, we encourage you to take your nausea medication   If you develop nausea and vomiting that is not controlled by your nausea medication, call the clinic.   BELOW ARE SYMPTOMS THAT SHOULD BE REPORTED IMMEDIATELY:  *FEVER GREATER THAN 100.5 F  *CHILLS WITH OR WITHOUT FEVER  NAUSEA AND VOMITING THAT IS NOT CONTROLLED WITH YOUR NAUSEA MEDICATION  *UNUSUAL SHORTNESS OF BREATH  *UNUSUAL BRUISING OR BLEEDING  TENDERNESS IN MOUTH AND THROAT WITH OR WITHOUT PRESENCE OF ULCERS  *URINARY PROBLEMS  *BOWEL PROBLEMS  UNUSUAL RASH Items with * indicate a potential emergency and should be followed up as soon as possible.  Feel free to call the clinic should you have any questions or concerns. The clinic phone number is [phone].  Please show the CHEMO ALERT CARD at check-in to the Emergency Department and triage nurse.   

## 2019-07-13 NOTE — Assessment & Plan Note (Addendum)
1.  Metastatic cholangiocarcinoma: -Foundation 1 CDX MS-stable, no other actionable mutations. -6 cycles of gemcitabine and cisplatin from 02/22/2019 through 06/16/2019. -CT CAP after 3 cycles on 04/24/2019 compared to PET scan from 01/17/2019 did not show any significant change in the index lesions in the liver and enlarged porta hepatis lymph nodes. -CEA improved to 9.9 (06/15/2019) from 44 (05/25/2019). - Chemotherapy last week was held due to elevated creatinine of 1.48. -She is feeling much better this week.  We have reviewed her blood work.  Creatinine improved to 1.16. - She will proceed with her cycle 7 today.  We will see her back in 1 week for follow-up.  She was encouraged to drink lots of fluids.  I plan to repeat scans after this cycle.  2.  Hypokalemia: - She is taking potassium 10 mEq 2 tablets twice daily.  Potassium is normal.  3.  Normocytic anemia: -Hemoglobin today is 8.4.  This is from myelosuppression from chemotherapy.

## 2019-07-13 NOTE — Patient Instructions (Addendum)
[facility] at [facility] Discharge Instructions  You were seen today by Dr. Katragadda. He went over your recent lab results. He will see you back in 1 week for labs and follow up.   Thank you for choosing [facility] at [facility] to provide your oncology and hematology care.  To afford each patient quality time with our provider, please arrive at least 15 minutes before your scheduled appointment time.   If you have a lab appointment with the Cancer Center please come in thru the  Main Entrance and check in at the main information desk  You need to re-schedule your appointment should you arrive 10 or more minutes late.  We strive to give you quality time with our providers, and arriving late affects you and other patients whose appointments are after yours.  Also, if you no show three or more times for appointments you may be dismissed from the clinic at the providers discretion.     Again, thank you for choosing [facility].  Our hope is that these requests will decrease the amount of time that you wait before being seen by our physicians.       _____________________________________________________________  Should you have questions after your visit to [facility], please contact our office at [phone] between the hours of 8:00 a.m. and 4:30 p.m.  Voicemails left after 4:00 p.m. will not be returned until the following business day.  For prescription refill requests, have your pharmacy contact our office and allow 72 hours.    Cancer Center Support Programs:   > Cancer Support Group  2nd Tuesday of the month 1pm-2pm, Journey Room    

## 2019-07-14 ENCOUNTER — Inpatient Hospital Stay (HOSPITAL_COMMUNITY): Payer: Medicare Other

## 2019-07-14 VITALS — BP 135/61 | HR 65 | Temp 97.1°F | Resp 18

## 2019-07-14 DIAGNOSIS — C221 Intrahepatic bile duct carcinoma: Secondary | ICD-10-CM

## 2019-07-14 DIAGNOSIS — Z5111 Encounter for antineoplastic chemotherapy: Secondary | ICD-10-CM | POA: Diagnosis not present

## 2019-07-14 MED ORDER — SODIUM CHLORIDE 0.9% FLUSH
10.0000 mL | Freq: Once | INTRAVENOUS | Status: AC | PRN
  Administered 2019-07-14: 10 mL

## 2019-07-14 MED ORDER — SODIUM CHLORIDE 0.9 % IV SOLN
Freq: Once | INTRAVENOUS | Status: AC
  Administered 2019-07-14: 09:00:00 via INTRAVENOUS
  Filled 2019-07-14: qty 1000

## 2019-07-14 MED ORDER — HEPARIN SOD (PORK) LOCK FLUSH 100 UNIT/ML IV SOLN
500.0000 [IU] | Freq: Once | INTRAVENOUS | Status: AC | PRN
  Administered 2019-07-14: 500 [IU]

## 2019-07-14 NOTE — Patient Instructions (Signed)
[facility] at [facility]  Discharge Instructions:   _______________________________________________________________  Thank you for choosing [facility] at [facility] to provide your oncology and hematology care.  To afford each patient quality time with our providers, please arrive at least 15 minutes before your scheduled appointment.  You need to re-schedule your appointment if you arrive 10 or more minutes late.  We strive to give you quality time with our providers, and arriving late affects you and other patients whose appointments are after yours.  Also, if you no show three or more times for appointments you may be dismissed from the clinic.  Again, thank you for choosing [facility] at [facility]. Our hope is that these requests will allow you access to exceptional care and in a timely manner. _______________________________________________________________  If you have questions after your visit, please contact our office at [phone] between the hours of 8:30 a.m. and 5:00 p.m. Voicemails left after 4:30 p.m. will not be returned until the following business day. _______________________________________________________________  For prescription refill requests, have your pharmacy contact our office. _______________________________________________________________  Recommendations made by the consultant and any test results will be sent to your referring physician. _______________________________________________________________ 

## 2019-07-14 NOTE — Progress Notes (Signed)
Hydration fluids given per orders. Patient tolerated it well without problems. Vitals stable and discharged home from clinic ambulatory. Follow up as scheduled.  

## 2019-07-20 ENCOUNTER — Encounter (HOSPITAL_COMMUNITY): Payer: Self-pay | Admitting: Hematology

## 2019-07-20 ENCOUNTER — Other Ambulatory Visit: Payer: Self-pay

## 2019-07-20 ENCOUNTER — Inpatient Hospital Stay (HOSPITAL_BASED_OUTPATIENT_CLINIC_OR_DEPARTMENT_OTHER): Payer: Medicare Other | Admitting: Hematology

## 2019-07-20 ENCOUNTER — Inpatient Hospital Stay (HOSPITAL_COMMUNITY): Payer: Medicare Other

## 2019-07-20 VITALS — BP 157/71 | HR 71 | Temp 97.6°F | Resp 18 | Wt 203.2 lb

## 2019-07-20 VITALS — BP 151/63 | HR 68 | Temp 97.8°F | Resp 18

## 2019-07-20 DIAGNOSIS — C787 Secondary malignant neoplasm of liver and intrahepatic bile duct: Secondary | ICD-10-CM | POA: Diagnosis not present

## 2019-07-20 DIAGNOSIS — C221 Intrahepatic bile duct carcinoma: Secondary | ICD-10-CM

## 2019-07-20 DIAGNOSIS — Z5111 Encounter for antineoplastic chemotherapy: Secondary | ICD-10-CM | POA: Diagnosis not present

## 2019-07-20 LAB — COMPREHENSIVE METABOLIC PANEL
ALT: 11 U/L (ref 0–44)
AST: 16 U/L (ref 15–41)
Albumin: 3.8 g/dL (ref 3.5–5.0)
Alkaline Phosphatase: 88 U/L (ref 38–126)
Anion gap: 11 (ref 5–15)
BUN: 28 mg/dL — ABNORMAL HIGH (ref 8–23)
CO2: 22 mmol/L (ref 22–32)
Calcium: 9.2 mg/dL (ref 8.9–10.3)
Chloride: 102 mmol/L (ref 98–111)
Creatinine, Ser: 1.26 mg/dL — ABNORMAL HIGH (ref 0.44–1.00)
GFR calc Af Amer: 52 mL/min — ABNORMAL LOW (ref >60)
GFR calc non Af Amer: 45 mL/min — ABNORMAL LOW (ref >60)
Glucose, Bld: 108 mg/dL — ABNORMAL HIGH (ref 70–99)
Potassium: 3.8 mmol/L (ref 3.5–5.1)
Sodium: 135 mmol/L (ref 135–145)
Total Bilirubin: 0.3 mg/dL (ref 0.3–1.2)
Total Protein: 7 g/dL (ref 6.5–8.1)

## 2019-07-20 LAB — CBC WITH DIFFERENTIAL/PLATELET
Abs Immature Granulocytes: 0 10*3/uL (ref 0.00–0.07)
Basophils Absolute: 0 10*3/uL (ref 0.0–0.1)
Basophils Relative: 1 %
Eosinophils Absolute: 0.1 10*3/uL (ref 0.0–0.5)
Eosinophils Relative: 2 %
HCT: 24.9 % — ABNORMAL LOW (ref 36.0–46.0)
Hemoglobin: 8.4 g/dL — ABNORMAL LOW (ref 12.0–15.0)
Immature Granulocytes: 0 %
Lymphocytes Relative: 64 %
Lymphs Abs: 2.4 10*3/uL (ref 0.7–4.0)
MCH: 34.6 pg — ABNORMAL HIGH (ref 26.0–34.0)
MCHC: 33.7 g/dL (ref 30.0–36.0)
MCV: 102.5 fL — ABNORMAL HIGH (ref 80.0–100.0)
Monocytes Absolute: 0.3 10*3/uL (ref 0.1–1.0)
Monocytes Relative: 8 %
Neutro Abs: 0.9 10*3/uL — ABNORMAL LOW (ref 1.7–7.7)
Neutrophils Relative %: 25 %
Platelets: 112 10*3/uL — ABNORMAL LOW (ref 150–400)
RBC: 2.43 MIL/uL — ABNORMAL LOW (ref 3.87–5.11)
RDW: 14.9 % (ref 11.5–15.5)
WBC: 3.7 10*3/uL — ABNORMAL LOW (ref 4.0–10.5)
nRBC: 0 % (ref 0.0–0.2)

## 2019-07-20 LAB — MAGNESIUM: Magnesium: 1.9 mg/dL (ref 1.7–2.4)

## 2019-07-20 MED ORDER — SODIUM CHLORIDE 0.9% FLUSH
10.0000 mL | INTRAVENOUS | Status: DC | PRN
  Administered 2019-07-20: 10 mL
  Filled 2019-07-20: qty 10

## 2019-07-20 MED ORDER — HEPARIN SOD (PORK) LOCK FLUSH 100 UNIT/ML IV SOLN
500.0000 [IU] | Freq: Once | INTRAVENOUS | Status: AC | PRN
  Administered 2019-07-20: 500 [IU]

## 2019-07-20 MED ORDER — SODIUM CHLORIDE 0.9 % IV SOLN
Freq: Once | INTRAVENOUS | Status: AC
  Administered 2019-07-20: 12:00:00 via INTRAVENOUS
  Filled 2019-07-20: qty 5

## 2019-07-20 MED ORDER — SODIUM CHLORIDE 0.9 % IV SOLN
25.0000 mg/m2 | Freq: Once | INTRAVENOUS | Status: AC
  Administered 2019-07-20: 52 mg via INTRAVENOUS
  Filled 2019-07-20: qty 52

## 2019-07-20 MED ORDER — SODIUM CHLORIDE 0.9 % IV SOLN
Freq: Once | INTRAVENOUS | Status: AC
  Administered 2019-07-20: 09:00:00 via INTRAVENOUS

## 2019-07-20 MED ORDER — SODIUM CHLORIDE 0.9 % IV SOLN
750.0000 mg/m2 | Freq: Once | INTRAVENOUS | Status: AC
  Administered 2019-07-20: 1558 mg via INTRAVENOUS
  Filled 2019-07-20: qty 26.3

## 2019-07-20 MED ORDER — POTASSIUM CHLORIDE 2 MEQ/ML IV SOLN
Freq: Once | INTRAVENOUS | Status: AC
  Administered 2019-07-20: 10:00:00 via INTRAVENOUS
  Filled 2019-07-20: qty 10

## 2019-07-20 MED ORDER — PALONOSETRON HCL INJECTION 0.25 MG/5ML
0.2500 mg | Freq: Once | INTRAVENOUS | Status: AC
  Administered 2019-07-20: 0.25 mg via INTRAVENOUS
  Filled 2019-07-20 (×2): qty 5

## 2019-07-20 NOTE — Assessment & Plan Note (Signed)
1.  Metastatic cholangiocarcinoma: -Foundation 1 CDX MS-stable, no other actionable mutations. - 7 cycles of gemcitabine and cisplatin from 02/22/2019 through 07/13/2019. - CT CAP after 3 cycles on 04/24/2019 compared to PET scan from 01/17/2019 did not show any significant change in the index lesions in the liver and enlarged porta hepatis lymph nodes. -Last CEA was 9.9 on 06/15/2019, down from 44 on 05/25/2019. - She is here for cycle 7-day 15 today.  We reviewed her labs.  Creatinine is mildly elevated at 1.2.  ANC is low at 900. -I will cut back on the dose of gemcitabine by 25%.  She will proceed with cycle 7-day 8 of treatment today. -She will be seen back in 2 weeks for follow-up.  I plan to repeat CT CAP prior to next visit.  We will also repeat a CEA level.  2.  Hypokalemia: - She is taking potassium 10 mEq 2 tablets twice daily.  Potassium is normal today.  3.  Normocytic anemia: -Hemoglobin is 8.4 today.  This is from myelosuppression from chemotherapy.

## 2019-07-20 NOTE — Progress Notes (Signed)
ANC 0.9 and Creatinine 1.26 today.  Patient seen by Dr. Delton Coombes with lab review and will dose reduce Gemzar for treatment today.  Ok to treat verbal order Dr. Delton Coombes.  Pharmacy notified.    Patient tolerated chemotherapy with no complaints voiced.  Port site clean and dry with no bruising or swelling noted at site.  Good blood return noted before and after administration of chemotherapy.  Dressing intact.   Patient left ambulatory with VSS and no s/s of distress noted.

## 2019-07-20 NOTE — Progress Notes (Signed)
Nicole Ibarra [place], [place] [address]   CLINIC:  Medical Oncology/Hematology  PCP:  Cory Munch, PA-C [place] [address] [phone]   REASON FOR VISIT: Follow-up for nausea and vomiting  CURRENT THERAPY: Cisplatin and gemcitabine.  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[place])   02/22/2019 -  Chemotherapy   The patient had palonosetron (ALOXI) injection 0.25 mg, 0.25 mg, Intravenous,  Once, 7 of 8 cycles Administration: 0.25 mg (02/22/2019), 0.25 mg (03/01/2019), 0.25 mg (03/15/2019), 0.25 mg (03/22/2019), 0.25 mg (04/06/2019), 0.25 mg (04/13/2019), 0.25 mg (04/27/2019), 0.25 mg (05/04/2019), 0.25 mg (05/18/2019), 0.25 mg (05/25/2019), 0.25 mg (06/16/2019), 0.25 mg (06/22/2019), 0.25 mg (07/13/2019), 0.25 mg (07/20/2019) pegfilgrastim-cbqv (UDENYCA) injection 6 mg, 6 mg, Subcutaneous, Once, 7 of 8 cycles Administration: 6 mg (03/02/2019), 6 mg (03/24/2019), 6 mg (05/05/2019), 6 mg (05/26/2019), 6 mg (06/23/2019) CISplatin (PLATINOL) 52 mg in sodium chloride 0.9 % 250 mL chemo infusion, 25 mg/m2 = 52 mg, Intravenous,  Once, 7 of 8 cycles Administration: 52 mg (02/22/2019), 52 mg (03/01/2019), 52 mg (03/15/2019), 52 mg (03/22/2019), 52 mg (04/06/2019), 52 mg (04/13/2019), 52 mg (04/27/2019), 52 mg (05/04/2019), 52 mg (05/18/2019), 52 mg (05/25/2019), 52 mg (06/16/2019), 52 mg (06/22/2019), 52 mg (07/13/2019), 52 mg (07/20/2019) gemcitabine (GEMZAR) 2,000 mg in sodium chloride 0.9 % 250 mL chemo infusion, 2,090 mg, Intravenous,  Once, 7 of 8 cycles Dose modification: 750 mg/m2 (75 % of original dose 1,000 mg/m2, Cycle 4, Reason: Other (see comments), Comment: neutropenia), 750 mg/m2 (75 % of original dose 1,000 mg/m2, Cycle 7, Reason: Other (see comments), Comment: neutripenia) Administration: 2,000 mg (02/22/2019), 2,000 mg (03/01/2019), 2,000 mg  (03/15/2019), 2,000 mg (03/22/2019), 2,000 mg (04/06/2019), 2,000 mg (04/13/2019), 2,000 mg (04/27/2019), 1,558 mg (05/04/2019), 2,000 mg (05/18/2019), 2,000 mg (05/25/2019), 2,000 mg (06/16/2019), 2,000 mg (06/22/2019), 2,000 mg (07/13/2019), 1,558 mg (07/20/2019) fosaprepitant (EMEND) 150 mg, dexamethasone (DECADRON) 12 mg in sodium chloride 0.9 % 145 mL IVPB, , Intravenous,  Once, 7 of 8 cycles Administration:  (02/22/2019),  (03/01/2019),  (03/15/2019),  (03/22/2019),  (04/06/2019),  (04/13/2019),  (04/27/2019),  (05/04/2019),  (05/18/2019),  (05/25/2019),  (06/16/2019),  (06/22/2019),  (07/13/2019),  (07/20/2019)  for chemotherapy treatment.       INTERVAL HISTORY:  Nicole Ibarra 63 y.o. female seen for follow-up of metastatic cholangiocarcinoma and cycle 7-day 15 of chemotherapy.  Last chemotherapy was 1 week ago.  She denied any nausea, vomiting, diarrhea or constipation.  Denies any tingling or numbness next images.  Denies any ringing in the ears or hearing loss.  Appetite and energy levels are 75%.  She did not lose any weight.  She is continuing potassium supplements.  REVIEW OF SYSTEMS:  Review of Systems  Neurological: Negative for numbness.  All other systems reviewed and are negative.    PAST MEDICAL/SURGICAL HISTORY:  Past Medical History:  Diagnosis Date  . Anxiety   . Arthritis   . Depression   . Hypertension    Past Surgical History:  Procedure Laterality Date  . COLONOSCOPY N/A 10/23/2016   Procedure: COLONOSCOPY;  Surgeon: Danie Binder, MD;  Location: [facility];  Service: Endoscopy;  Laterality: N/A;  11:30 Am  . POLYPECTOMY  10/23/2016   Procedure: POLYPECTOMY;  Surgeon: Danie Binder, MD;  Location: [facility];  Service: Endoscopy;;  sigmoid colon polyp  . PORTACATH PLACEMENT Left 02/16/2019  Procedure: INSERTION PORT-A-CATH (attached catheter in left subclavian);  Surgeon: Virl Cagey, MD;  Location: [facility];  Service: General;  Laterality: Left;  . RT HIP SURGERY    . TOTAL  HIP ARTHROPLASTY Left 04/07/2016   Procedure: LEFT TOTAL HIP ARTHROPLASTY ANTERIOR APPROACH;  Surgeon: Paralee Cancel, MD;  Location: [facility];  Service: Orthopedics;  Laterality: Left;  Unsuccessful for Spinal, went to General     SOCIAL HISTORY:  Social History   Socioeconomic History  . Marital status: Single    Spouse name: Not on file  . Number of children: Not on file  . Years of education: Not on file  . Highest education level: Not on file  Occupational History  . Not on file  Social Needs  . Financial resource strain: Not hard at all  . Food insecurity    Worry: Never true    Inability: Never true  . Transportation needs    Medical: No    Non-medical: No  Tobacco Use  . Smoking status: Current Every Day Smoker    Packs/day: 0.25    Years: 42.00    Pack years: 10.50  . Smokeless tobacco: Never Used  Substance and Sexual Activity  . Alcohol use: No  . Drug use: No  . Sexual activity: Not Currently  Lifestyle  . Physical activity    Days per week: 0 days    Minutes per session: 0 min  . Stress: Only a little  Relationships  . Social Herbalist on phone: Three times a week    Gets together: Three times a week    Attends religious service: More than 4 times per year    Active member of club or organization: Yes    Attends meetings of clubs or organizations: More than 4 times per year    Relationship status: Never married  . Intimate partner violence    Fear of current or ex partner: No    Emotionally abused: No    Physically abused: No    Forced sexual activity: No  Other Topics Concern  . Not on file  Social History Narrative  . Not on file    FAMILY HISTORY:  Family History  Problem Relation Age of Onset  . Hypertension Mother   . Cancer Father   . Hypertension Brother   . Cancer Brother   . Hypertension Sister   . Cancer Sister     CURRENT MEDICATIONS:  Outpatient Encounter Medications as of 07/20/2019  Medication Sig  . amLODipine  (NORVASC) 10 MG tablet Take 1 tablet (10 mg total) by mouth daily.  Marland Kitchen atorvastatin (LIPITOR) 20 MG tablet Take 20 mg by mouth daily at 6 PM.   . CISPLATIN IV Inject into the vein. Day 1, 8 every 21 days  . doxazosin (CARDURA) 2 MG tablet Take 2 mg by mouth every evening.   Marland Kitchen GEMCITABINE HCL IV Inject into the vein. Day 1, 8 every 21 days  . HYDROcodone-acetaminophen (NORCO) 5-325 MG tablet Take 1 tablet by mouth every 4 (four) hours as needed for moderate pain.  Marland Kitchen KLOR-CON M20 20 MEQ tablet TAKE 1 TABLET BY MOUTH TWICE A DAY  . lidocaine (XYLOCAINE) 2 % solution Swish and swallow 1 tablespoon four times a day prn sore mouth  . lidocaine-prilocaine (EMLA) cream Apply to skin over port a cath one hour prior to chemotherapy appointment  . naproxen sodium (ALEVE) 220 MG tablet Take 220 mg by mouth 2 (two) times daily  as needed.   . potassium chloride (K-DUR) 10 MEQ tablet Take 2 tablets (20 mEq total) by mouth 2 (two) times daily.  . prochlorperazine (COMPAZINE) 10 MG tablet Take 1 tablet (10 mg total) by mouth every 6 (six) hours as needed (Nausea or vomiting).  . vitamin B-12 (CYANOCOBALAMIN) 50 MCG tablet Take 50 mcg by mouth daily.   No facility-administered encounter medications on file as of 07/20/2019.     ALLERGIES:  No Known Allergies   PHYSICAL EXAM:  ECOG Performance status: 1 Blood pressure is 164/53, pulse rate is 79, respiratory is 18, temperature 98. Vitals:   07/20/19 0751  BP: (!) 157/71  Pulse: 71  Resp: 18  Temp: 97.6 F (36.4 C)  SpO2: 100%   Filed Weights   07/20/19 0751  Weight: 203 lb 3.2 oz (92.2 kg)    Physical Exam Constitutional:      Appearance: Normal appearance. She is normal weight.  Cardiovascular:     Rate and Rhythm: Normal rate and regular rhythm.     Heart sounds: Normal heart sounds.  Pulmonary:     Effort: Pulmonary effort is normal.     Breath sounds: Normal breath sounds.  Abdominal:     General: Bowel sounds are normal.      Palpations: Abdomen is soft.  Musculoskeletal: Normal range of motion.  Skin:    General: Skin is warm and dry.  Neurological:     Mental Status: She is alert and oriented to person, place, and time. Mental status is at baseline.  Psychiatric:        Mood and Affect: Mood normal.        Behavior: Behavior normal.        Thought Content: Thought content normal.        Judgment: Judgment normal.      LABORATORY DATA:  I have reviewed the labs as listed.  CBC    Component Value Date/Time   WBC 3.7 (L) 07/20/2019 0751   RBC 2.43 (L) 07/20/2019 0751   HGB 8.4 (L) 07/20/2019 0751   HCT 24.9 (L) 07/20/2019 0751   PLT 112 (L) 07/20/2019 0751   MCV 102.5 (H) 07/20/2019 0751   MCH 34.6 (H) 07/20/2019 0751   MCHC 33.7 07/20/2019 0751   RDW 14.9 07/20/2019 0751   LYMPHSABS 2.4 07/20/2019 0751   MONOABS 0.3 07/20/2019 0751   EOSABS 0.1 07/20/2019 0751   BASOSABS 0.0 07/20/2019 0751   CMP Latest Ref Rng & Units 07/20/2019 07/13/2019 07/06/2019  Glucose 70 - 99 mg/dL 108(H) 99 186(H)  BUN 8 - 23 mg/dL 28(H) 18 17  Creatinine 0.44 - 1.00 mg/dL 1.26(H) 1.16(H) 1.48(H)  Sodium 135 - 145 mmol/L 135 136 137  Potassium 3.5 - 5.1 mmol/L 3.8 3.6 3.1(L)  Chloride 98 - 111 mmol/L 102 105 103  CO2 22 - 32 mmol/L 22 24 23   Calcium 8.9 - 10.3 mg/dL 9.2 9.2 9.1  Total Protein 6.5 - 8.1 g/dL 7.0 7.0 6.9  Total Bilirubin 0.3 - 1.2 mg/dL 0.3 0.4 0.2(L)  Alkaline Phos 38 - 126 U/L 88 99 108  AST 15 - 41 U/L 16 14(L) 16  ALT 0 - 44 U/L 11 8 8     I have independently reviewed scans and discussed with the patient.   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  Metastatic cholangiocarcinoma: -Foundation 1 CDX MS-stable, no other actionable mutations. - 7 cycles of gemcitabine and cisplatin from 02/22/2019 through 07/13/2019. - CT CAP  after 3 cycles on 04/24/2019 compared to PET scan from 01/17/2019 did not show any significant change in the index lesions in the liver and  enlarged porta hepatis lymph nodes. -Last CEA was 9.9 on 06/15/2019, down from 44 on 05/25/2019. - She is here for cycle 7-day 15 today.  We reviewed her labs.  Creatinine is mildly elevated at 1.2.  ANC is low at 900. -I will cut back on the dose of gemcitabine by 25%.  She will proceed with cycle 7-day 8 of treatment today. -She will be seen back in 2 weeks for follow-up.  I plan to repeat CT CAP prior to next visit.  We will also repeat a CEA level.  2.  Hypokalemia: - She is taking potassium 10 mEq 2 tablets twice daily.  Potassium is normal today.  3.  Normocytic anemia: -Hemoglobin is 8.4 today.  This is from myelosuppression from chemotherapy.   Total time spent is 25 minutes with more than 50% of the time spent face-to-face discussing treatment plan, counseling and coordination of care.  Orders placed this encounter:  Orders Placed This Encounter  Procedures  . CT Abdomen Pelvis W Contrast  . CT Chest W Contrast  . CEA  . CBC with Differential/Platelet  . Comprehensive metabolic panel      Derek Jack, MD [place] [phone]

## 2019-07-20 NOTE — Patient Instructions (Addendum)
[place] at [facility] Discharge Instructions  You were seen today by Dr. Delton Coombes. He went over your recent lab results. He will schedule you for repeat CT scans. He will see you back in 2 weeks for labs, treatment and follow up.   Thank you for choosing [place] at [facility] to provide your oncology and hematology care.  To afford each patient quality time with our provider, please arrive at least 15 minutes before your scheduled appointment time.   If you have a lab appointment with the [facility] please come in thru the  Main Entrance and check in at the main information desk  You need to re-schedule your appointment should you arrive 10 or more minutes late.  We strive to give you quality time with our providers, and arriving late affects you and other patients whose appointments are after yours.  Also, if you no show three or more times for appointments you may be dismissed from the clinic at the providers discretion.     Again, thank you for choosing [facility].  Our hope is that these requests will decrease the amount of time that you wait before being seen by our physicians.       _____________________________________________________________  Should you have questions after your visit to [facility], please contact our office at [phone] between the hours of 8:00 a.m. and 4:30 p.m.  Voicemails left after 4:00 p.m. will not be returned until the following business day.  For prescription refill requests, have your pharmacy contact our office and allow 72 hours.    Cancer Center Support Programs:   > Cancer Support Group  2nd Tuesday of the month 1pm-2pm, Journey Room

## 2019-07-21 ENCOUNTER — Encounter (HOSPITAL_COMMUNITY): Payer: Self-pay

## 2019-07-21 ENCOUNTER — Inpatient Hospital Stay (HOSPITAL_COMMUNITY): Payer: Medicare Other

## 2019-07-21 VITALS — BP 140/66 | HR 68 | Temp 97.8°F | Resp 18

## 2019-07-21 DIAGNOSIS — Z5111 Encounter for antineoplastic chemotherapy: Secondary | ICD-10-CM | POA: Diagnosis not present

## 2019-07-21 DIAGNOSIS — C221 Intrahepatic bile duct carcinoma: Secondary | ICD-10-CM

## 2019-07-21 DIAGNOSIS — C787 Secondary malignant neoplasm of liver and intrahepatic bile duct: Secondary | ICD-10-CM

## 2019-07-21 MED ORDER — SODIUM CHLORIDE 0.9 % IV SOLN
Freq: Once | INTRAVENOUS | Status: AC
  Administered 2019-07-21: 10:00:00 via INTRAVENOUS
  Filled 2019-07-21: qty 1000

## 2019-07-21 MED ORDER — SODIUM CHLORIDE 0.9% FLUSH
10.0000 mL | Freq: Once | INTRAVENOUS | Status: AC | PRN
  Administered 2019-07-21: 10 mL

## 2019-07-21 MED ORDER — HEPARIN SOD (PORK) LOCK FLUSH 100 UNIT/ML IV SOLN
500.0000 [IU] | Freq: Once | INTRAVENOUS | Status: AC | PRN
  Administered 2019-07-21: 500 [IU]

## 2019-07-21 MED ORDER — PEGFILGRASTIM-CBQV 6 MG/0.6ML ~~LOC~~ SOSY
6.0000 mg | PREFILLED_SYRINGE | Freq: Once | SUBCUTANEOUS | Status: AC
  Administered 2019-07-21: 6 mg via SUBCUTANEOUS
  Filled 2019-07-21: qty 0.6

## 2019-07-21 NOTE — Progress Notes (Signed)
Nicole Ibarra tolerated IV hydration with magnesium and potassium and Udenyca injection well without complaints or incident. VSS upon discharge. Pt discharged self ambulatory in satisfactory condition

## 2019-07-21 NOTE — Patient Instructions (Signed)
[place] at [facility] Discharge Instructions  Received IV hydration with magnesium and potassium and Udenyca injection today. Follow-up as scheduled. Call clinic for any questions or concerns   Thank you for choosing [place] at [facility] to provide your oncology and hematology care.  To afford each patient quality time with our provider, please arrive at least 15 minutes before your scheduled appointment time.   If you have a lab appointment with the [facility] please come in thru the  Main Entrance and check in at the main information desk  You need to re-schedule your appointment should you arrive 10 or more minutes late.  We strive to give you quality time with our providers, and arriving late affects you and other patients whose appointments are after yours.  Also, if you no show three or more times for appointments you may be dismissed from the clinic at the providers discretion.     Again, thank you for choosing [facility].  Our hope is that these requests will decrease the amount of time that you wait before being seen by our physicians.       _____________________________________________________________  Should you have questions after your visit to [facility], please contact our office at [phone] between the hours of 8:00 a.m. and 4:30 p.m.  Voicemails left after 4:00 p.m. will not be returned until the following business day.  For prescription refill requests, have your pharmacy contact our office and allow 72 hours.    Cancer Center Support Programs:   > Cancer Support Group  2nd Tuesday of the month 1pm-2pm, Journey Room

## 2019-07-26 ENCOUNTER — Other Ambulatory Visit (HOSPITAL_COMMUNITY): Payer: Self-pay | Admitting: Physician Assistant

## 2019-07-26 DIAGNOSIS — Z1231 Encounter for screening mammogram for malignant neoplasm of breast: Secondary | ICD-10-CM

## 2019-07-31 ENCOUNTER — Ambulatory Visit (HOSPITAL_COMMUNITY)
Admission: RE | Admit: 2019-07-31 | Discharge: 2019-07-31 | Disposition: A | Discharge status: Home / Self Care | Payer: Medicare Other | Source: Ambulatory Visit | Attending: Physician Assistant | Admitting: Physician Assistant

## 2019-07-31 ENCOUNTER — Ambulatory Visit (HOSPITAL_COMMUNITY)
Admission: RE | Admit: 2019-07-31 | Discharge: 2019-07-31 | Disposition: A | Discharge status: Home / Self Care | Payer: Medicare Other | Source: Ambulatory Visit | Attending: Hematology | Admitting: Hematology

## 2019-07-31 ENCOUNTER — Other Ambulatory Visit: Payer: Self-pay

## 2019-07-31 DIAGNOSIS — Z1231 Encounter for screening mammogram for malignant neoplasm of breast: Secondary | ICD-10-CM

## 2019-07-31 DIAGNOSIS — C221 Intrahepatic bile duct carcinoma: Secondary | ICD-10-CM

## 2019-07-31 DIAGNOSIS — C787 Secondary malignant neoplasm of liver and intrahepatic bile duct: Secondary | ICD-10-CM | POA: Insufficient documentation

## 2019-07-31 MED ORDER — IOHEXOL 300 MG/ML  SOLN
100.0000 mL | Freq: Once | INTRAMUSCULAR | Status: AC | PRN
  Administered 2019-07-31: 12:00:00 100 mL via INTRAVENOUS

## 2019-08-02 ENCOUNTER — Other Ambulatory Visit (HOSPITAL_COMMUNITY): Payer: Self-pay | Admitting: Nurse Practitioner

## 2019-08-02 DIAGNOSIS — R112 Nausea with vomiting, unspecified: Secondary | ICD-10-CM

## 2019-08-02 DIAGNOSIS — C221 Intrahepatic bile duct carcinoma: Secondary | ICD-10-CM

## 2019-08-03 ENCOUNTER — Inpatient Hospital Stay (HOSPITAL_BASED_OUTPATIENT_CLINIC_OR_DEPARTMENT_OTHER): Payer: Medicare Other | Admitting: Hematology

## 2019-08-03 ENCOUNTER — Other Ambulatory Visit: Payer: Self-pay

## 2019-08-03 ENCOUNTER — Inpatient Hospital Stay (HOSPITAL_COMMUNITY): Payer: Medicare Other

## 2019-08-03 ENCOUNTER — Encounter (HOSPITAL_COMMUNITY): Payer: Self-pay | Admitting: Hematology

## 2019-08-03 DIAGNOSIS — C221 Intrahepatic bile duct carcinoma: Secondary | ICD-10-CM | POA: Diagnosis not present

## 2019-08-03 DIAGNOSIS — Z5111 Encounter for antineoplastic chemotherapy: Secondary | ICD-10-CM | POA: Diagnosis not present

## 2019-08-03 DIAGNOSIS — C787 Secondary malignant neoplasm of liver and intrahepatic bile duct: Secondary | ICD-10-CM

## 2019-08-03 LAB — CBC WITH DIFFERENTIAL/PLATELET
Abs Immature Granulocytes: 0.07 10*3/uL (ref 0.00–0.07)
Basophils Absolute: 0 10*3/uL (ref 0.0–0.1)
Basophils Relative: 0 %
Eosinophils Absolute: 0.2 10*3/uL (ref 0.0–0.5)
Eosinophils Relative: 3 %
HCT: 23.9 % — ABNORMAL LOW (ref 36.0–46.0)
Hemoglobin: 7.9 g/dL — ABNORMAL LOW (ref 12.0–15.0)
Immature Granulocytes: 1 %
Lymphocytes Relative: 32 %
Lymphs Abs: 2.1 10*3/uL (ref 0.7–4.0)
MCH: 34.6 pg — ABNORMAL HIGH (ref 26.0–34.0)
MCHC: 33.1 g/dL (ref 30.0–36.0)
MCV: 104.8 fL — ABNORMAL HIGH (ref 80.0–100.0)
Monocytes Absolute: 0.6 10*3/uL (ref 0.1–1.0)
Monocytes Relative: 9 %
Neutro Abs: 3.6 10*3/uL (ref 1.7–7.7)
Neutrophils Relative %: 55 %
Platelets: 133 10*3/uL — ABNORMAL LOW (ref 150–400)
RBC: 2.28 MIL/uL — ABNORMAL LOW (ref 3.87–5.11)
RDW: 16 % — ABNORMAL HIGH (ref 11.5–15.5)
WBC: 6.6 10*3/uL (ref 4.0–10.5)
nRBC: 0 % (ref 0.0–0.2)

## 2019-08-03 LAB — COMPREHENSIVE METABOLIC PANEL
ALT: 8 U/L (ref 0–44)
AST: 14 U/L — ABNORMAL LOW (ref 15–41)
Albumin: 3.8 g/dL (ref 3.5–5.0)
Alkaline Phosphatase: 101 U/L (ref 38–126)
Anion gap: 9 (ref 5–15)
BUN: 17 mg/dL (ref 8–23)
CO2: 25 mmol/L (ref 22–32)
Calcium: 9.3 mg/dL (ref 8.9–10.3)
Chloride: 106 mmol/L (ref 98–111)
Creatinine, Ser: 1.56 mg/dL — ABNORMAL HIGH (ref 0.44–1.00)
GFR calc Af Amer: 40 mL/min — ABNORMAL LOW (ref >60)
GFR calc non Af Amer: 35 mL/min — ABNORMAL LOW (ref >60)
Glucose, Bld: 105 mg/dL — ABNORMAL HIGH (ref 70–99)
Potassium: 3.5 mmol/L (ref 3.5–5.1)
Sodium: 140 mmol/L (ref 135–145)
Total Bilirubin: 0.5 mg/dL (ref 0.3–1.2)
Total Protein: 6.9 g/dL (ref 6.5–8.1)

## 2019-08-03 MED ORDER — SODIUM CHLORIDE 0.9% FLUSH
10.0000 mL | Freq: Once | INTRAVENOUS | Status: AC | PRN
  Administered 2019-08-03: 08:00:00 10 mL

## 2019-08-03 MED ORDER — HEPARIN SOD (PORK) LOCK FLUSH 100 UNIT/ML IV SOLN
500.0000 [IU] | Freq: Once | INTRAVENOUS | Status: AC | PRN
  Administered 2019-08-03: 500 [IU]

## 2019-08-03 MED ORDER — SODIUM CHLORIDE 0.9 % IV SOLN
Freq: Once | INTRAVENOUS | Status: AC
  Administered 2019-08-03: 09:00:00 via INTRAVENOUS
  Filled 2019-08-03: qty 1000

## 2019-08-03 NOTE — Assessment & Plan Note (Addendum)
1.  Metastatic cholangiocarcinoma: -Foundation 1 CDX MS-stable, no other actionable mutations. -7 cycles of gemcitabine and cisplatin from 02/22/2019 through 07/13/2019. -CT CAP after 6 cycles on 04/24/2019 compared to PET scan from 01/17/2019 did not show any change in index lesions in the liver and enlarged porta hepatis lymph nodes. - Last CEA was 9.9 on 06/15/2019, down from 44 on 05/25/2019. - We reviewed results of the CT CAP on 07/31/2019 which showed improving liver metastasis.  Segment 4/5 lesion measures 1.1 x 1.3 cm compared to 1.8 x 1.7 cm on the prior exam.  Right liver lobe lesion measures 1.7 x 1.6 (2 x 2.6 cm).  No new areas were seen. - Her creatinine is elevated.  We will hold her chemotherapy.  We will give her hydration today and tomorrow.  She was encouraged to drink plenty of fluids. -We will see her back next week for follow-up and possible chemotherapy if creatinine improves.  2.  Hypokalemia: -She is taking potassium 10 mEq 2 tablets twice daily.  3.  Health maintenance: -Mammogram on 07/31/2019 was BI-RADS Category 1.

## 2019-08-03 NOTE — Progress Notes (Signed)
[address] [place], [place] [address]   CLINIC:  Medical Oncology/Hematology  PCP:  Cory Munch, PA-C [place] [id number] [phone]   REASON FOR VISIT: Follow-up for nausea and vomiting  CURRENT THERAPY: Cisplatin and gemcitabine.  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[place])   02/22/2019 -  Chemotherapy   The patient had palonosetron (ALOXI) injection 0.25 mg, 0.25 mg, Intravenous,  Once, 8 of 8 cycles Administration: 0.25 mg (02/22/2019), 0.25 mg (03/01/2019), 0.25 mg (03/15/2019), 0.25 mg (03/22/2019), 0.25 mg (04/06/2019), 0.25 mg (04/13/2019), 0.25 mg (04/27/2019), 0.25 mg (05/04/2019), 0.25 mg (05/18/2019), 0.25 mg (05/25/2019), 0.25 mg (06/16/2019), 0.25 mg (06/22/2019), 0.25 mg (07/13/2019), 0.25 mg (07/20/2019), 0.25 mg (08/10/2019) pegfilgrastim-cbqv (UDENYCA) injection 6 mg, 6 mg, Subcutaneous, Once, 8 of 8 cycles Administration: 6 mg (03/02/2019), 6 mg (03/24/2019), 6 mg (05/05/2019), 6 mg (05/26/2019), 6 mg (06/23/2019), 6 mg (07/21/2019) CISplatin (PLATINOL) 52 mg in sodium chloride 0.9 % 250 mL chemo infusion, 25 mg/m2 = 52 mg, Intravenous,  Once, 8 of 8 cycles Administration: 52 mg (02/22/2019), 52 mg (03/01/2019), 52 mg (03/15/2019), 52 mg (03/22/2019), 52 mg (04/06/2019), 52 mg (04/13/2019), 52 mg (04/27/2019), 52 mg (05/04/2019), 52 mg (05/18/2019), 52 mg (05/25/2019), 52 mg (06/16/2019), 52 mg (06/22/2019), 52 mg (07/13/2019), 52 mg (07/20/2019), 52 mg (08/10/2019) gemcitabine (GEMZAR) 2,000 mg in sodium chloride 0.9 % 250 mL chemo infusion, 2,090 mg, Intravenous,  Once, 8 of 8 cycles Dose modification: 750 mg/m2 (75 % of original dose 1,000 mg/m2, Cycle 4, Reason: Other (see comments), Comment: neutropenia), 750 mg/m2 (75 % of original dose 1,000 mg/m2, Cycle 7, Reason: Other (see comments), Comment: neutripenia)  Administration: 2,000 mg (02/22/2019), 2,000 mg (03/01/2019), 2,000 mg (03/15/2019), 2,000 mg (03/22/2019), 2,000 mg (04/06/2019), 2,000 mg (04/13/2019), 2,000 mg (04/27/2019), 1,558 mg (05/04/2019), 2,000 mg (05/18/2019), 2,000 mg (05/25/2019), 2,000 mg (06/16/2019), 2,000 mg (06/22/2019), 2,000 mg (07/13/2019), 1,558 mg (07/20/2019), 2,000 mg (08/10/2019) fosaprepitant (EMEND) 150 mg, dexamethasone (DECADRON) 12 mg in sodium chloride 0.9 % 145 mL IVPB, , Intravenous,  Once, 8 of 8 cycles Administration:  (02/22/2019),  (03/01/2019),  (03/15/2019),  (03/22/2019),  (04/06/2019),  (04/13/2019),  (04/27/2019),  (05/04/2019),  (05/18/2019),  (05/25/2019),  (06/16/2019),  (06/22/2019),  (07/13/2019),  (07/20/2019),  (08/10/2019)  for chemotherapy treatment.       INTERVAL HISTORY:  Ms. Reedus 63 y.o. female seen for follow-up of metastatic cholangiocarcinoma and to start cycle 8 of chemotherapy.  Denied any nausea, vomiting, diarrhea or constipation.  Denies any tingling or numbness in the extremities.  She forgot to take blood pressure medication today.  Denies any ringing in the ears.  No fevers or chills reported.  REVIEW OF SYSTEMS:  Review of Systems  Neurological: Negative for numbness.  All other systems reviewed and are negative.    PAST MEDICAL/SURGICAL HISTORY:  Past Medical History:  Diagnosis Date  . Anxiety   . Arthritis   . Depression   . Hypertension    Past Surgical History:  Procedure Laterality Date  . COLONOSCOPY N/A 10/23/2016   Procedure: COLONOSCOPY;  Surgeon: Danie Binder, MD;  Location: [facility];  Service: Endoscopy;  Laterality: N/A;  11:30 Am  . POLYPECTOMY  10/23/2016   Procedure: POLYPECTOMY;  Surgeon: Danie Binder, MD;  Location: [facility];  Service: Endoscopy;;  sigmoid colon polyp  . PORTACATH PLACEMENT Left 02/16/2019  Procedure: INSERTION PORT-A-CATH (attached catheter in left subclavian);  Surgeon: Virl Cagey, MD;  Location: [facility]S;  Service: General;  Laterality: Left;   . RT HIP SURGERY    . TOTAL HIP ARTHROPLASTY Left 04/07/2016   Procedure: LEFT TOTAL HIP ARTHROPLASTY ANTERIOR APPROACH;  Surgeon: Paralee Cancel, MD;  Location: [facility];  Service: Orthopedics;  Laterality: Left;  Unsuccessful for Spinal, went to General     SOCIAL HISTORY:  Social History   Socioeconomic History  . Marital status: Single    Spouse name: Not on file  . Number of children: Not on file  . Years of education: Not on file  . Highest education level: Not on file  Occupational History  . Not on file  Social Needs  . Financial resource strain: Not hard at all  . Food insecurity    Worry: Never true    Inability: Never true  . Transportation needs    Medical: No    Non-medical: No  Tobacco Use  . Smoking status: Current Every Day Smoker    Packs/day: 0.25    Years: 42.00    Pack years: 10.50  . Smokeless tobacco: Never Used  Substance and Sexual Activity  . Alcohol use: No  . Drug use: No  . Sexual activity: Not Currently  Lifestyle  . Physical activity    Days per week: 0 days    Minutes per session: 0 min  . Stress: Only a little  Relationships  . Social Herbalist on phone: Three times a week    Gets together: Three times a week    Attends religious service: More than 4 times per year    Active member of club or organization: Yes    Attends meetings of clubs or organizations: More than 4 times per year    Relationship status: Never married  . Intimate partner violence    Fear of current or ex partner: No    Emotionally abused: No    Physically abused: No    Forced sexual activity: No  Other Topics Concern  . Not on file  Social History Narrative  . Not on file    FAMILY HISTORY:  Family History  Problem Relation Age of Onset  . Hypertension Mother   . Cancer Father   . Hypertension Brother   . Cancer Brother   . Hypertension Sister   . Cancer Sister     CURRENT MEDICATIONS:  Outpatient Encounter Medications as of 08/03/2019   Medication Sig  . amLODipine (NORVASC) 10 MG tablet Take 1 tablet (10 mg total) by mouth daily.  Marland Kitchen CISPLATIN IV Inject into the vein. Day 1, 8 every 21 days  . doxazosin (CARDURA) 2 MG tablet Take 2 mg by mouth every evening.   Marland Kitchen GEMCITABINE HCL IV Inject into the vein. Day 1, 8 every 21 days  . lidocaine (XYLOCAINE) 2 % solution Swish and swallow 1 tablespoon four times a day prn sore mouth (Patient not taking: Reported on 08/10/2019)  . lidocaine-prilocaine (EMLA) cream Apply to skin over port a cath one hour prior to chemotherapy appointment (Patient not taking: Reported on 08/10/2019)  . naproxen sodium (ALEVE) 220 MG tablet Take 220 mg by mouth 2 (two) times daily as needed.   . prochlorperazine (COMPAZINE) 10 MG tablet TAKE 1 TABLET (10 MG TOTAL) BY MOUTH EVERY 6 (SIX) HOURS AS NEEDED (NAUSEA OR VOMITING). (Patient not taking: Reported on 08/10/2019)  . vitamin B-12 (CYANOCOBALAMIN) 50 MCG tablet  Take 50 mcg by mouth daily.  . [DISCONTINUED] atorvastatin (LIPITOR) 20 MG tablet Take 20 mg by mouth daily at 6 PM.   . [DISCONTINUED] HYDROcodone-acetaminophen (NORCO) 5-325 MG tablet Take 1 tablet by mouth every 4 (four) hours as needed for moderate pain.  . [DISCONTINUED] KLOR-CON M20 20 MEQ tablet TAKE 1 TABLET BY MOUTH TWICE A DAY  . [DISCONTINUED] potassium chloride (K-DUR) 10 MEQ tablet Take 2 tablets (20 mEq total) by mouth 2 (two) times daily.   No facility-administered encounter medications on file as of 08/03/2019.     ALLERGIES:  No Known Allergies   PHYSICAL EXAM:  ECOG Performance status: 1  Vitals:   08/03/19 0751  BP: (!) 170/54  Pulse: 78  Resp: 18  Temp: (!) 96.7 F (35.9 C)  SpO2: 100%   Filed Weights   08/03/19 0751  Weight: 203 lb 6.4 oz (92.3 kg)    Physical Exam Constitutional:      Appearance: Normal appearance. She is normal weight.  Cardiovascular:     Rate and Rhythm: Normal rate and regular rhythm.     Heart sounds: Normal heart sounds.  Pulmonary:      Effort: Pulmonary effort is normal.     Breath sounds: Normal breath sounds.  Abdominal:     General: Bowel sounds are normal.     Palpations: Abdomen is soft.  Musculoskeletal: Normal range of motion.  Skin:    General: Skin is warm and dry.  Neurological:     Mental Status: She is alert and oriented to person, place, and time. Mental status is at baseline.  Psychiatric:        Mood and Affect: Mood normal.        Behavior: Behavior normal.        Thought Content: Thought content normal.        Judgment: Judgment normal.      LABORATORY DATA:  I have reviewed the labs as listed.  CBC    Component Value Date/Time   WBC 8.1 08/10/2019 0755   RBC 2.50 (L) 08/10/2019 0755   HGB 8.5 (L) 08/10/2019 0755   HCT 26.2 (L) 08/10/2019 0755   PLT 257 08/10/2019 0755   MCV 104.8 (H) 08/10/2019 0755   MCH 34.0 08/10/2019 0755   MCHC 32.4 08/10/2019 0755   RDW 15.9 (H) 08/10/2019 0755   LYMPHSABS 2.7 08/10/2019 0755   MONOABS 1.1 (H) 08/10/2019 0755   EOSABS 0.2 08/10/2019 0755   BASOSABS 0.0 08/10/2019 0755   CMP Latest Ref Rng & Units 08/10/2019 08/03/2019 07/20/2019  Glucose 70 - 99 mg/dL 101(H) 105(H) 108(H)  BUN 8 - 23 mg/dL 16 17 28(H)  Creatinine 0.44 - 1.00 mg/dL 1.39(H) 1.56(H) 1.26(H)  Sodium 135 - 145 mmol/L 138 140 135  Potassium 3.5 - 5.1 mmol/L 3.7 3.5 3.8  Chloride 98 - 111 mmol/L 103 106 102  CO2 22 - 32 mmol/L 24 25 22   Calcium 8.9 - 10.3 mg/dL 9.4 9.3 9.2  Total Protein 6.5 - 8.1 g/dL 7.1 6.9 7.0  Total Bilirubin 0.3 - 1.2 mg/dL 0.3 0.5 0.3  Alkaline Phos 38 - 126 U/L 88 101 88  AST 15 - 41 U/L 13(L) 14(L) 16  ALT 0 - 44 U/L 9 8 11     I have independently reviewed scans and discussed with the patient.   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  Metastatic cholangiocarcinoma: -Foundation 1 CDX MS-stable, no other actionable mutations. -7 cycles of gemcitabine  and cisplatin from 02/22/2019 through 07/13/2019. -CT CAP after 6  cycles on 04/24/2019 compared to PET scan from 01/17/2019 did not show any change in index lesions in the liver and enlarged porta hepatis lymph nodes. - Last CEA was 9.9 on 06/15/2019, down from 44 on 05/25/2019. - We reviewed results of the CT CAP on 07/31/2019 which showed improving liver metastasis.  Segment 4/5 lesion measures 1.1 x 1.3 cm compared to 1.8 x 1.7 cm on the prior exam.  Right liver lobe lesion measures 1.7 x 1.6 (2 x 2.6 cm).  No new areas were seen. - Her creatinine is elevated.  We will hold her chemotherapy.  We will give her hydration today and tomorrow.  She was encouraged to drink plenty of fluids. -We will see her back next week for follow-up and possible chemotherapy if creatinine improves.  2.  Hypokalemia: -She is taking potassium 10 mEq 2 tablets twice daily.  3.  Health maintenance: -Mammogram on 07/31/2019 was BI-RADS Category 1.   Total time spent is 25 minutes with more than 50% of the time spent face-to-face discussing treatment plan, counseling and coordination of care.  Orders placed this encounter:  No orders of the defined types were placed in this encounter.     Derek Jack, MD [place] [phone]

## 2019-08-03 NOTE — Progress Notes (Signed)
Patient seen by Dr. Delton Coombes with lab review.  Hydration only today and tomorrow.  Pharmacy notified.   Patient tolerated hydration with no complaints voiced.  Port site clean and dry with good blood return noted before and after hydration.  No bruising or swelling noted with port.  Dressing intact.   VSS with discharge and left ambulatory with no s/s of distress noted.

## 2019-08-03 NOTE — Patient Instructions (Addendum)
[facility] at [facility] Discharge Instructions  You were seen today by Dr. Katragadda. He went over your recent lab and scan results. He will see you back in 1 week for labs and follow up.   Thank you for choosing [facility] at [facility] to provide your oncology and hematology care.  To afford each patient quality time with our provider, please arrive at least 15 minutes before your scheduled appointment time.   If you have a lab appointment with the Cancer Center please come in thru the  Main Entrance and check in at the main information desk  You need to re-schedule your appointment should you arrive 10 or more minutes late.  We strive to give you quality time with our providers, and arriving late affects you and other patients whose appointments are after yours.  Also, if you no show three or more times for appointments you may be dismissed from the clinic at the providers discretion.     Again, thank you for choosing [facility].  Our hope is that these requests will decrease the amount of time that you wait before being seen by our physicians.       _____________________________________________________________  Should you have questions after your visit to [facility], please contact our office at [phone] between the hours of 8:00 a.m. and 4:30 p.m.  Voicemails left after 4:00 p.m. will not be returned until the following business day.  For prescription refill requests, have your pharmacy contact our office and allow 72 hours.    Cancer Center Support Programs:   > Cancer Support Group  2nd Tuesday of the month 1pm-2pm, Journey Room    

## 2019-08-04 ENCOUNTER — Inpatient Hospital Stay (HOSPITAL_COMMUNITY): Payer: Medicare Other

## 2019-08-04 VITALS — BP 157/74 | HR 69 | Temp 98.1°F | Resp 18

## 2019-08-04 DIAGNOSIS — Z5111 Encounter for antineoplastic chemotherapy: Secondary | ICD-10-CM | POA: Diagnosis not present

## 2019-08-04 DIAGNOSIS — C221 Intrahepatic bile duct carcinoma: Secondary | ICD-10-CM

## 2019-08-04 LAB — CEA: CEA: 8 ng/mL — ABNORMAL HIGH (ref 0.0–4.7)

## 2019-08-04 MED ORDER — SODIUM CHLORIDE 0.9% FLUSH
10.0000 mL | Freq: Once | INTRAVENOUS | Status: AC | PRN
  Administered 2019-08-04: 10 mL

## 2019-08-04 MED ORDER — HEPARIN SOD (PORK) LOCK FLUSH 100 UNIT/ML IV SOLN
500.0000 [IU] | Freq: Once | INTRAVENOUS | Status: AC | PRN
  Administered 2019-08-04: 500 [IU]

## 2019-08-04 MED ORDER — SODIUM CHLORIDE 0.9 % IV SOLN
Freq: Once | INTRAVENOUS | Status: AC
  Administered 2019-08-04: 10:00:00 via INTRAVENOUS
  Filled 2019-08-04: qty 1000

## 2019-08-04 NOTE — Progress Notes (Signed)
Pt presents today for IV hydration only. VSS. Pt has no complaints of any changes since the last visit. MAR reviewed.   IV hydration given today per MD orders. Tolerated infusion without adverse affects. Vital signs stable. No complaints at this time. Discharged from clinic ambulatory. F/U with [facility] as scheduled.

## 2019-08-04 NOTE — Patient Instructions (Signed)
[facility] at [facility]  Discharge Instructions:   _______________________________________________________________  Thank you for choosing [facility] at [facility] to provide your oncology and hematology care.  To afford each patient quality time with our providers, please arrive at least 15 minutes before your scheduled appointment.  You need to re-schedule your appointment if you arrive 10 or more minutes late.  We strive to give you quality time with our providers, and arriving late affects you and other patients whose appointments are after yours.  Also, if you no show three or more times for appointments you may be dismissed from the clinic.  Again, thank you for choosing [facility] at [facility]. Our hope is that these requests will allow you access to exceptional care and in a timely manner. _______________________________________________________________  If you have questions after your visit, please contact our office at [phone] between the hours of 8:30 a.m. and 5:00 p.m. Voicemails left after 4:30 p.m. will not be returned until the following business day. _______________________________________________________________  For prescription refill requests, have your pharmacy contact our office. _______________________________________________________________  Recommendations made by the consultant and any test results will be sent to your referring physician. _______________________________________________________________ 

## 2019-08-10 ENCOUNTER — Encounter (HOSPITAL_COMMUNITY): Payer: Self-pay | Admitting: Hematology

## 2019-08-10 ENCOUNTER — Inpatient Hospital Stay (HOSPITAL_BASED_OUTPATIENT_CLINIC_OR_DEPARTMENT_OTHER): Payer: Medicare Other | Admitting: Hematology

## 2019-08-10 ENCOUNTER — Inpatient Hospital Stay (HOSPITAL_COMMUNITY): Payer: Medicare Other

## 2019-08-10 ENCOUNTER — Inpatient Hospital Stay (HOSPITAL_COMMUNITY): Payer: Medicare Other | Attending: Hematology

## 2019-08-10 ENCOUNTER — Other Ambulatory Visit: Payer: Self-pay

## 2019-08-10 VITALS — BP 168/70 | HR 72 | Temp 97.7°F | Resp 18

## 2019-08-10 DIAGNOSIS — Z5189 Encounter for other specified aftercare: Secondary | ICD-10-CM | POA: Insufficient documentation

## 2019-08-10 DIAGNOSIS — E876 Hypokalemia: Secondary | ICD-10-CM | POA: Insufficient documentation

## 2019-08-10 DIAGNOSIS — Z5111 Encounter for antineoplastic chemotherapy: Secondary | ICD-10-CM | POA: Diagnosis present

## 2019-08-10 DIAGNOSIS — F17210 Nicotine dependence, cigarettes, uncomplicated: Secondary | ICD-10-CM | POA: Diagnosis not present

## 2019-08-10 DIAGNOSIS — C221 Intrahepatic bile duct carcinoma: Secondary | ICD-10-CM | POA: Diagnosis not present

## 2019-08-10 DIAGNOSIS — Z809 Family history of malignant neoplasm, unspecified: Secondary | ICD-10-CM | POA: Insufficient documentation

## 2019-08-10 DIAGNOSIS — Z79899 Other long term (current) drug therapy: Secondary | ICD-10-CM | POA: Insufficient documentation

## 2019-08-10 DIAGNOSIS — K5900 Constipation, unspecified: Secondary | ICD-10-CM | POA: Insufficient documentation

## 2019-08-10 DIAGNOSIS — C787 Secondary malignant neoplasm of liver and intrahepatic bile duct: Secondary | ICD-10-CM

## 2019-08-10 DIAGNOSIS — C969 Malignant neoplasm of lymphoid, hematopoietic and related tissue, unspecified: Secondary | ICD-10-CM | POA: Diagnosis present

## 2019-08-10 DIAGNOSIS — Z8249 Family history of ischemic heart disease and other diseases of the circulatory system: Secondary | ICD-10-CM | POA: Diagnosis not present

## 2019-08-10 LAB — CBC WITH DIFFERENTIAL/PLATELET
Abs Immature Granulocytes: 0.08 10*3/uL — ABNORMAL HIGH (ref 0.00–0.07)
Basophils Absolute: 0 10*3/uL (ref 0.0–0.1)
Basophils Relative: 0 %
Eosinophils Absolute: 0.2 10*3/uL (ref 0.0–0.5)
Eosinophils Relative: 2 %
HCT: 26.2 % — ABNORMAL LOW (ref 36.0–46.0)
Hemoglobin: 8.5 g/dL — ABNORMAL LOW (ref 12.0–15.0)
Immature Granulocytes: 1 %
Lymphocytes Relative: 33 %
Lymphs Abs: 2.7 10*3/uL (ref 0.7–4.0)
MCH: 34 pg (ref 26.0–34.0)
MCHC: 32.4 g/dL (ref 30.0–36.0)
MCV: 104.8 fL — ABNORMAL HIGH (ref 80.0–100.0)
Monocytes Absolute: 1.1 10*3/uL — ABNORMAL HIGH (ref 0.1–1.0)
Monocytes Relative: 14 %
Neutro Abs: 4 10*3/uL (ref 1.7–7.7)
Neutrophils Relative %: 50 %
Platelets: 257 10*3/uL (ref 150–400)
RBC: 2.5 MIL/uL — ABNORMAL LOW (ref 3.87–5.11)
RDW: 15.9 % — ABNORMAL HIGH (ref 11.5–15.5)
WBC: 8.1 10*3/uL (ref 4.0–10.5)
nRBC: 0 % (ref 0.0–0.2)

## 2019-08-10 LAB — COMPREHENSIVE METABOLIC PANEL
ALT: 9 U/L (ref 0–44)
AST: 13 U/L — ABNORMAL LOW (ref 15–41)
Albumin: 3.8 g/dL (ref 3.5–5.0)
Alkaline Phosphatase: 88 U/L (ref 38–126)
Anion gap: 11 (ref 5–15)
BUN: 16 mg/dL (ref 8–23)
CO2: 24 mmol/L (ref 22–32)
Calcium: 9.4 mg/dL (ref 8.9–10.3)
Chloride: 103 mmol/L (ref 98–111)
Creatinine, Ser: 1.39 mg/dL — ABNORMAL HIGH (ref 0.44–1.00)
GFR calc Af Amer: 46 mL/min — ABNORMAL LOW (ref >60)
GFR calc non Af Amer: 40 mL/min — ABNORMAL LOW (ref >60)
Glucose, Bld: 101 mg/dL — ABNORMAL HIGH (ref 70–99)
Potassium: 3.7 mmol/L (ref 3.5–5.1)
Sodium: 138 mmol/L (ref 135–145)
Total Bilirubin: 0.3 mg/dL (ref 0.3–1.2)
Total Protein: 7.1 g/dL (ref 6.5–8.1)

## 2019-08-10 LAB — MAGNESIUM: Magnesium: 1.6 mg/dL — ABNORMAL LOW (ref 1.7–2.4)

## 2019-08-10 MED ORDER — SODIUM CHLORIDE 0.9 % IV SOLN
Freq: Once | INTRAVENOUS | Status: AC
  Administered 2019-08-10: 09:00:00 via INTRAVENOUS

## 2019-08-10 MED ORDER — SODIUM CHLORIDE 0.9 % IV SOLN
2000.0000 mg | Freq: Once | INTRAVENOUS | Status: AC
  Administered 2019-08-10: 2000 mg via INTRAVENOUS
  Filled 2019-08-10: qty 26.3

## 2019-08-10 MED ORDER — SODIUM CHLORIDE 0.9 % IV SOLN
Freq: Once | INTRAVENOUS | Status: AC
  Administered 2019-08-10: 12:00:00 via INTRAVENOUS
  Filled 2019-08-10: qty 5

## 2019-08-10 MED ORDER — HEPARIN SOD (PORK) LOCK FLUSH 100 UNIT/ML IV SOLN
500.0000 [IU] | Freq: Once | INTRAVENOUS | Status: AC | PRN
  Administered 2019-08-10: 500 [IU]

## 2019-08-10 MED ORDER — SODIUM CHLORIDE 0.9 % IV SOLN
Freq: Once | INTRAVENOUS | Status: AC
  Administered 2019-08-10: 13:00:00 via INTRAVENOUS

## 2019-08-10 MED ORDER — PALONOSETRON HCL INJECTION 0.25 MG/5ML
0.2500 mg | Freq: Once | INTRAVENOUS | Status: AC
  Administered 2019-08-10: 0.25 mg via INTRAVENOUS

## 2019-08-10 MED ORDER — SODIUM CHLORIDE 0.9% FLUSH
10.0000 mL | INTRAVENOUS | Status: DC | PRN
  Administered 2019-08-10: 10 mL
  Filled 2019-08-10: qty 10

## 2019-08-10 MED ORDER — SODIUM CHLORIDE 0.9 % IV SOLN
25.0000 mg/m2 | Freq: Once | INTRAVENOUS | Status: AC
  Administered 2019-08-10: 52 mg via INTRAVENOUS
  Filled 2019-08-10: qty 52

## 2019-08-10 MED ORDER — SODIUM CHLORIDE 0.9 % IV SOLN
Freq: Once | INTRAVENOUS | Status: AC
  Administered 2019-08-10: 09:00:00 via INTRAVENOUS
  Filled 2019-08-10: qty 1000

## 2019-08-10 MED ORDER — PALONOSETRON HCL INJECTION 0.25 MG/5ML
INTRAVENOUS | Status: AC
  Filled 2019-08-10: qty 5

## 2019-08-10 NOTE — Progress Notes (Signed)
Nicole Ibarra, [place] [id number]   CLINIC:  Medical Oncology/Hematology  PCP:  Cory Munch, PA-C [place] [id number] [phone]   REASON FOR VISIT: Follow-up for nausea and vomiting  CURRENT THERAPY: Cisplatin and gemcitabine.  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[place])   02/22/2019 -  Chemotherapy   The patient had palonosetron (ALOXI) injection 0.25 mg, 0.25 mg, Intravenous,  Once, 8 of 8 cycles Administration: 0.25 mg (02/22/2019), 0.25 mg (03/01/2019), 0.25 mg (03/15/2019), 0.25 mg (03/22/2019), 0.25 mg (04/06/2019), 0.25 mg (04/13/2019), 0.25 mg (04/27/2019), 0.25 mg (05/04/2019), 0.25 mg (05/18/2019), 0.25 mg (05/25/2019), 0.25 mg (06/16/2019), 0.25 mg (06/22/2019), 0.25 mg (07/13/2019), 0.25 mg (07/20/2019), 0.25 mg (08/10/2019) pegfilgrastim-cbqv (UDENYCA) injection 6 mg, 6 mg, Subcutaneous, Once, 8 of 8 cycles Administration: 6 mg (03/02/2019), 6 mg (03/24/2019), 6 mg (05/05/2019), 6 mg (05/26/2019), 6 mg (06/23/2019), 6 mg (07/21/2019) CISplatin (PLATINOL) 52 mg in sodium chloride 0.9 % 250 mL chemo infusion, 25 mg/m2 = 52 mg, Intravenous,  Once, 8 of 8 cycles Administration: 52 mg (02/22/2019), 52 mg (03/01/2019), 52 mg (03/15/2019), 52 mg (03/22/2019), 52 mg (04/06/2019), 52 mg (04/13/2019), 52 mg (04/27/2019), 52 mg (05/04/2019), 52 mg (05/18/2019), 52 mg (05/25/2019), 52 mg (06/16/2019), 52 mg (06/22/2019), 52 mg (07/13/2019), 52 mg (07/20/2019), 52 mg (08/10/2019) gemcitabine (GEMZAR) 2,000 mg in sodium chloride 0.9 % 250 mL chemo infusion, 2,090 mg, Intravenous,  Once, 8 of 8 cycles Dose modification: 750 mg/m2 (75 % of original dose 1,000 mg/m2, Cycle 4, Reason: Other (see comments), Comment: neutropenia), 750 mg/m2 (75 % of original dose 1,000 mg/m2, Cycle 7, Reason: Other (see comments), Comment: neutripenia)  Administration: 2,000 mg (02/22/2019), 2,000 mg (03/01/2019), 2,000 mg (03/15/2019), 2,000 mg (03/22/2019), 2,000 mg (04/06/2019), 2,000 mg (04/13/2019), 2,000 mg (04/27/2019), 1,558 mg (05/04/2019), 2,000 mg (05/18/2019), 2,000 mg (05/25/2019), 2,000 mg (06/16/2019), 2,000 mg (06/22/2019), 2,000 mg (07/13/2019), 1,558 mg (07/20/2019), 2,000 mg (08/10/2019) fosaprepitant (EMEND) 150 mg, dexamethasone (DECADRON) 12 mg in sodium chloride 0.9 % 145 mL IVPB, , Intravenous,  Once, 8 of 8 cycles Administration:  (02/22/2019),  (03/01/2019),  (03/15/2019),  (03/22/2019),  (04/06/2019),  (04/13/2019),  (04/27/2019),  (05/04/2019),  (05/18/2019),  (05/25/2019),  (06/16/2019),  (06/22/2019),  (07/13/2019),  (07/20/2019),  (08/10/2019)  for chemotherapy treatment.       INTERVAL HISTORY:  Nicole Ibarra 63 y.o. female seen for follow-up of metastatic cholangiocarcinoma and cycle 8 of chemotherapy.  We held her last chemotherapy last week as her creatinine was elevated at 1.59.  She is reports that she has been drinking a lot of fluids.  Denies any tingling or numbness next midis.  Mild constipation is stable.  Denies any nausea vomiting diarrhea.  Denies any ringing in the ears.  Appetite is 75%.  Energy levels are 50%.  REVIEW OF SYSTEMS:  Review of Systems  Gastrointestinal: Positive for constipation.  Neurological: Negative for numbness.  All other systems reviewed and are negative.    PAST MEDICAL/SURGICAL HISTORY:  Past Medical History:  Diagnosis Date  . Anxiety   . Arthritis   . Depression   . Hypertension    Past Surgical History:  Procedure Laterality Date  . COLONOSCOPY N/A 10/23/2016   Procedure: COLONOSCOPY;  Surgeon: Danie Binder, MD;  Location[facility];  Service: Endoscopy;  Laterality: N/A;  11:30 Am  . POLYPECTOMY  10/23/2016  Procedure: POLYPECTOMY;  Surgeon: Danie Binder, MD;  Location: [facility];  Service: Endoscopy;;  sigmoid colon polyp  . PORTACATH PLACEMENT Left 02/16/2019   Procedure: INSERTION  PORT-A-CATH (attached catheter in left subclavian);  Surgeon: Virl Cagey, MD;  Location: [facility] ORS;  Service: General;  Laterality: Left;  . RT HIP SURGERY    . TOTAL HIP ARTHROPLASTY Left 04/07/2016   Procedure: LEFT TOTAL HIP ARTHROPLASTY ANTERIOR APPROACH;  Surgeon: Paralee Cancel, MD;  Location: [facility];  Service: Orthopedics;  Laterality: Left;  Unsuccessful for Spinal, went to General     SOCIAL HISTORY:  Social History   Socioeconomic History  . Marital status: Single    Spouse name: Not on file  . Number of children: Not on file  . Years of education: Not on file  . Highest education level: Not on file  Occupational History  . Not on file  Social Needs  . Financial resource strain: Not hard at all  . Food insecurity    Worry: Never true    Inability: Never true  . Transportation needs    Medical: No    Non-medical: No  Tobacco Use  . Smoking status: Current Every Day Smoker    Packs/day: 0.25    Years: 42.00    Pack years: 10.50  . Smokeless tobacco: Never Used  Substance and Sexual Activity  . Alcohol use: No  . Drug use: No  . Sexual activity: Not Currently  Lifestyle  . Physical activity    Days per week: 0 days    Minutes per session: 0 min  . Stress: Only a little  Relationships  . Social Herbalist on phone: Three times a week    Gets together: Three times a week    Attends religious service: More than 4 times per year    Active member of club or organization: Yes    Attends meetings of clubs or organizations: More than 4 times per year    Relationship status: Never married  . Intimate partner violence    Fear of current or ex partner: No    Emotionally abused: No    Physically abused: No    Forced sexual activity: No  Other Topics Concern  . Not on file  Social History Narrative  . Not on file    FAMILY HISTORY:  Family History  Problem Relation Age of Onset  . Hypertension Mother   . Cancer Father   . Hypertension Brother   .  Cancer Brother   . Hypertension Sister   . Cancer Sister     CURRENT MEDICATIONS:  Outpatient Encounter Medications as of 08/10/2019  Medication Sig  . amLODipine (NORVASC) 10 MG tablet Take 1 tablet (10 mg total) by mouth daily.  Marland Kitchen CISPLATIN IV Inject into the vein. Day 1, 8 every 21 days  . doxazosin (CARDURA) 2 MG tablet Take 2 mg by mouth every evening.   Marland Kitchen GEMCITABINE HCL IV Inject into the vein. Day 1, 8 every 21 days  . vitamin B-12 (CYANOCOBALAMIN) 50 MCG tablet Take 50 mcg by mouth daily.  . [DISCONTINUED] KLOR-CON M20 20 MEQ tablet TAKE 1 TABLET BY MOUTH TWICE A DAY  . lidocaine (XYLOCAINE) 2 % solution Swish and swallow 1 tablespoon four times a day prn sore mouth (Patient not taking: Reported on 08/10/2019)  . lidocaine-prilocaine (EMLA) cream Apply to skin over port a cath one hour prior to chemotherapy appointment (Patient not taking: Reported on 08/10/2019)  .  naproxen sodium (ALEVE) 220 MG tablet Take 220 mg by mouth 2 (two) times daily as needed.   . prochlorperazine (COMPAZINE) 10 MG tablet TAKE 1 TABLET (10 MG TOTAL) BY MOUTH EVERY 6 (SIX) HOURS AS NEEDED (NAUSEA OR VOMITING). (Patient not taking: Reported on 08/10/2019)  . [DISCONTINUED] atorvastatin (LIPITOR) 20 MG tablet Take 20 mg by mouth daily at 6 PM.   . [DISCONTINUED] HYDROcodone-acetaminophen (NORCO) 5-325 MG tablet Take 1 tablet by mouth every 4 (four) hours as needed for moderate pain.  . [DISCONTINUED] potassium chloride (K-DUR) 10 MEQ tablet Take 2 tablets (20 mEq total) by mouth 2 (two) times daily.   No facility-administered encounter medications on file as of 08/10/2019.     ALLERGIES:  No Known Allergies   PHYSICAL EXAM:  ECOG Performance status: 1  Vitals:   08/10/19 0805 08/10/19 0812  BP: (!) 158/54 (!) 158/54  Pulse: 61 61  Resp: 18 18  Temp: (!) 97.1 F (36.2 C) (!) 97.1 F (36.2 C)  SpO2: 100% 100%   Filed Weights   08/10/19 0805 08/10/19 0812  Weight: 202 lb 12.8 oz (92 kg) 202 lb 12.8 oz  (92 kg)    Physical Exam Constitutional:      Appearance: Normal appearance. She is normal weight.  Cardiovascular:     Rate and Rhythm: Normal rate and regular rhythm.     Heart sounds: Normal heart sounds.  Pulmonary:     Effort: Pulmonary effort is normal.     Breath sounds: Normal breath sounds.  Abdominal:     General: Bowel sounds are normal.     Palpations: Abdomen is soft.  Musculoskeletal: Normal range of motion.  Skin:    General: Skin is warm and dry.  Neurological:     Mental Status: She is alert and oriented to person, place, and time. Mental status is at baseline.  Psychiatric:        Mood and Affect: Mood normal.        Behavior: Behavior normal.        Thought Content: Thought content normal.        Judgment: Judgment normal.      LABORATORY DATA:  I have reviewed the labs as listed.  CBC    Component Value Date/Time   WBC 8.1 08/10/2019 0755   RBC 2.50 (L) 08/10/2019 0755   HGB 8.5 (L) 08/10/2019 0755   HCT 26.2 (L) 08/10/2019 0755   PLT 257 08/10/2019 0755   MCV 104.8 (H) 08/10/2019 0755   MCH 34.0 08/10/2019 0755   MCHC 32.4 08/10/2019 0755   RDW 15.9 (H) 08/10/2019 0755   LYMPHSABS 2.7 08/10/2019 0755   MONOABS 1.1 (H) 08/10/2019 0755   EOSABS 0.2 08/10/2019 0755   BASOSABS 0.0 08/10/2019 0755   CMP Latest Ref Rng & Units 08/10/2019 08/03/2019 07/20/2019  Glucose 70 - 99 mg/dL 101(H) 105(H) 108(H)  BUN 8 - 23 mg/dL 16 17 28(H)  Creatinine 0.44 - 1.00 mg/dL 1.39(H) 1.56(H) 1.26(H)  Sodium 135 - 145 mmol/L 138 140 135  Potassium 3.5 - 5.1 mmol/L 3.7 3.5 3.8  Chloride 98 - 111 mmol/L 103 106 102  CO2 22 - 32 mmol/L 24 25 22   Calcium 8.9 - 10.3 mg/dL 9.4 9.3 9.2  Total Protein 6.5 - 8.1 g/dL 7.1 6.9 7.0  Total Bilirubin 0.3 - 1.2 mg/dL 0.3 0.5 0.3  Alkaline Phos 38 - 126 U/L 88 101 88  AST 15 - 41 U/L 13(L) 14(L) 16  ALT 0 -  44 U/L 9 8 11     I have independently reviewed scans and discussed with the patient.   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  Metastatic cholangiocarcinoma: -Foundation 1 CDX MS-stable, no other actionable mutations. -7 cycles of gemcitabine and cisplatin from 02/22/2019 through 07/13/2019. -CT CAP after 6 cycles on 04/24/2019 compared to PET scan from 01/17/2019 did not show any change in index lesions in the liver and enlarged porta hepatis lymph nodes. - Last CEA was 9.9 on 06/15/2019, down from 44 on 05/25/2019. - CT CAP on 07/31/2019 showed improving liver metastasis. - We held her last chemotherapy last week because of elevated creatinine. -Today creatinine has come to 1.39.  We will proceed with day 1 of cycle 8.  She will receive IV hydration tomorrow.  She was encouraged to drink a lot of fluids. -She will be reevaluated in 1 week for day 8 of therapy.  2.  Hypokalemia: -She will continue taking potassium 10 mEq 2 tablets twice daily.  3.  Health maintenance: -Mammogram on 07/31/2019 was BI-RADS Category 1.   Total time spent is 25 minutes with more than 50% of the time spent face-to-face discussing treatment plan, counseling and coordination of care.  Orders placed this encounter:  No orders of the defined types were placed in this encounter.     Derek Jack, MD [place] [phone]

## 2019-08-10 NOTE — Progress Notes (Signed)
08/10/19  Received verbal order to modify Cisplatin hydration to the following:  PRE Cisplatin - NS 1 liter + Potassium Chloride 20 meq + Magnesium sulfate 2 gm to infuse over 2 hours (500 ml/hr) x 1 bag  POST Cisplatin - NS 500 ml over 1 hour x 1 bag  V.O. Dr Rhys Martini, PharmD

## 2019-08-10 NOTE — Patient Instructions (Addendum)
[place] at [facility] Discharge Instructions  You were seen today by Dr. Delton Coombes. He went over your recent results.He will schedule you for fluids tomorrow. He will see you back in 1 week for labs and follow up.  START taking a stool softener to help with constipation  Thank you for choosing [place] at [facility] to provide your oncology and hematology care.  To afford each patient quality time with our provider, please arrive at least 15 minutes before your scheduled appointment time.   If you have a lab appointment with the [facility] please come in thru the  Main Entrance and check in at the main information desk  You need to re-schedule your appointment should you arrive 10 or more minutes late.  We strive to give you quality time with our providers, and arriving late affects you and other patients whose appointments are after yours.  Also, if you no show three or more times for appointments you may be dismissed from the clinic at the providers discretion.     Again, thank you for choosing [facility].  Our hope is that these requests will decrease the amount of time that you wait before being seen by our physicians.       _____________________________________________________________  Should you have questions after your visit to [facility], please contact our office at [phone] between the hours of 8:00 a.m. and 4:30 p.m.  Voicemails left after 4:00 p.m. will not be returned until the following business day.  For prescription refill requests, have your pharmacy contact our office and allow 72 hours.    Cancer Center Support Programs:   > Cancer Support Group  2nd Tuesday of the month 1pm-2pm, Journey Room

## 2019-08-10 NOTE — Progress Notes (Signed)
Patient seen by Dr. Delton Coombes with lab review and ok to treat today.  Pharmacy aware of hydration change.    Patient tolerated chemotherapy with no complaints voiced.  Port site clean and dry with no bruising or swelling noted at site.  Good blood return noted before and after administration of chemotherapy.  Dressing intact.   Patient left ambulatory with VSS and no s/s of distress noted.

## 2019-08-10 NOTE — Patient Instructions (Signed)
[facility] at [facility] Discharge Instructions  Labs drawn from portacath today   Thank you for choosing [facility] at [facility] to provide your oncology and hematology care.  To afford each patient quality time with our provider, please arrive at least 15 minutes before your scheduled appointment time.   If you have a lab appointment with the Cancer Center please come in thru the Main Entrance and check in at the main information desk.  You need to re-schedule your appointment should you arrive 10 or more minutes late.  We strive to give you quality time with our providers, and arriving late affects you and other patients whose appointments are after yours.  Also, if you no show three or more times for appointments you may be dismissed from the clinic at the providers discretion.     Again, thank you for choosing [facility].  Our hope is that these requests will decrease the amount of time that you wait before being seen by our physicians.       _____________________________________________________________  Should you have questions after your visit to [facility], please contact our office at [phone] between the hours of 8:00 a.m. and 4:30 p.m.  Voicemails left after 4:00 p.m. will not be returned until the following business day.  For prescription refill requests, have your pharmacy contact our office and allow 72 hours.    Due to Covid, you will need to wear a mask upon entering the hospital. If you do not have a mask, a mask will be given to you at the Main Entrance upon arrival. For doctor visits, patients may have 1 support person with them. For treatment visits, patients can not have anyone with them due to social distancing guidelines and our immunocompromised population.     

## 2019-08-10 NOTE — Assessment & Plan Note (Signed)
1.  Metastatic cholangiocarcinoma: -Foundation 1 CDX MS-stable, no other actionable mutations. -7 cycles of gemcitabine and cisplatin from 02/22/2019 through 07/13/2019. -CT CAP after 6 cycles on 04/24/2019 compared to PET scan from 01/17/2019 did not show any change in index lesions in the liver and enlarged porta hepatis lymph nodes. - Last CEA was 9.9 on 06/15/2019, down from 44 on 05/25/2019. - CT CAP on 07/31/2019 showed improving liver metastasis. - We held her last chemotherapy last week because of elevated creatinine. -Today creatinine has come to 1.39.  We will proceed with day 1 of cycle 8.  She will receive IV hydration tomorrow.  She was encouraged to drink a lot of fluids. -She will be reevaluated in 1 week for day 8 of therapy.  2.  Hypokalemia: -She will continue taking potassium 10 mEq 2 tablets twice daily.  3.  Health maintenance: -Mammogram on 07/31/2019 was BI-RADS Category 1.

## 2019-08-11 ENCOUNTER — Other Ambulatory Visit: Payer: Self-pay

## 2019-08-11 ENCOUNTER — Inpatient Hospital Stay (HOSPITAL_COMMUNITY): Payer: Medicare Other

## 2019-08-11 VITALS — BP 147/60 | HR 65 | Temp 97.7°F | Resp 18

## 2019-08-11 DIAGNOSIS — Z5111 Encounter for antineoplastic chemotherapy: Secondary | ICD-10-CM | POA: Diagnosis not present

## 2019-08-11 DIAGNOSIS — C787 Secondary malignant neoplasm of liver and intrahepatic bile duct: Secondary | ICD-10-CM

## 2019-08-11 DIAGNOSIS — C221 Intrahepatic bile duct carcinoma: Secondary | ICD-10-CM

## 2019-08-11 MED ORDER — SODIUM CHLORIDE 0.9% FLUSH: 10.0000 mL | Freq: Once | INTRAVENOUS | Status: DC | PRN

## 2019-08-11 MED ORDER — SODIUM CHLORIDE 0.9 % IV SOLN
Freq: Once | INTRAVENOUS | Status: AC
  Administered 2019-08-11: 10:00:00 via INTRAVENOUS
  Filled 2019-08-11: qty 1000

## 2019-08-11 MED ORDER — HEPARIN SOD (PORK) LOCK FLUSH 100 UNIT/ML IV SOLN
500.0000 [IU] | Freq: Once | INTRAVENOUS | Status: AC | PRN
  Administered 2019-08-11: 500 [IU]

## 2019-08-11 NOTE — Patient Instructions (Signed)
[facility] at [facility]  Discharge Instructions:   _______________________________________________________________  Thank you for choosing [facility] at [facility] to provide your oncology and hematology care.  To afford each patient quality time with our providers, please arrive at least 15 minutes before your scheduled appointment.  You need to re-schedule your appointment if you arrive 10 or more minutes late.  We strive to give you quality time with our providers, and arriving late affects you and other patients whose appointments are after yours.  Also, if you no show three or more times for appointments you may be dismissed from the clinic.  Again, thank you for choosing [facility] at [facility]. Our hope is that these requests will allow you access to exceptional care and in a timely manner. _______________________________________________________________  If you have questions after your visit, please contact our office at [phone] between the hours of 8:30 a.m. and 5:00 p.m. Voicemails left after 4:30 p.m. will not be returned until the following business day. _______________________________________________________________  For prescription refill requests, have your pharmacy contact our office. _______________________________________________________________  Recommendations made by the consultant and any test results will be sent to your referring physician. _______________________________________________________________ 

## 2019-08-17 ENCOUNTER — Inpatient Hospital Stay (HOSPITAL_COMMUNITY): Payer: Medicare Other

## 2019-08-17 ENCOUNTER — Inpatient Hospital Stay (HOSPITAL_BASED_OUTPATIENT_CLINIC_OR_DEPARTMENT_OTHER): Payer: Medicare Other | Admitting: Hematology

## 2019-08-17 ENCOUNTER — Encounter (HOSPITAL_COMMUNITY): Payer: Self-pay | Admitting: Hematology

## 2019-08-17 ENCOUNTER — Other Ambulatory Visit: Payer: Self-pay

## 2019-08-17 VITALS — BP 145/58 | HR 77 | Temp 97.7°F | Resp 18

## 2019-08-17 DIAGNOSIS — C787 Secondary malignant neoplasm of liver and intrahepatic bile duct: Secondary | ICD-10-CM | POA: Diagnosis not present

## 2019-08-17 DIAGNOSIS — C221 Intrahepatic bile duct carcinoma: Secondary | ICD-10-CM | POA: Diagnosis not present

## 2019-08-17 DIAGNOSIS — Z5111 Encounter for antineoplastic chemotherapy: Secondary | ICD-10-CM | POA: Diagnosis not present

## 2019-08-17 LAB — COMPREHENSIVE METABOLIC PANEL
ALT: 11 U/L (ref 0–44)
AST: 14 U/L — ABNORMAL LOW (ref 15–41)
Albumin: 3.8 g/dL (ref 3.5–5.0)
Alkaline Phosphatase: 77 U/L (ref 38–126)
Anion gap: 10 (ref 5–15)
BUN: 30 mg/dL — ABNORMAL HIGH (ref 8–23)
CO2: 25 mmol/L (ref 22–32)
Calcium: 9.1 mg/dL (ref 8.9–10.3)
Chloride: 102 mmol/L (ref 98–111)
Creatinine, Ser: 1.55 mg/dL — ABNORMAL HIGH (ref 0.44–1.00)
GFR calc Af Amer: 41 mL/min — ABNORMAL LOW (ref >60)
GFR calc non Af Amer: 35 mL/min — ABNORMAL LOW (ref >60)
Glucose, Bld: 100 mg/dL — ABNORMAL HIGH (ref 70–99)
Potassium: 3.3 mmol/L — ABNORMAL LOW (ref 3.5–5.1)
Sodium: 137 mmol/L (ref 135–145)
Total Bilirubin: 0.6 mg/dL (ref 0.3–1.2)
Total Protein: 6.7 g/dL (ref 6.5–8.1)

## 2019-08-17 LAB — CBC WITH DIFFERENTIAL/PLATELET
Abs Immature Granulocytes: 0 10*3/uL (ref 0.00–0.07)
Basophils Absolute: 0 10*3/uL (ref 0.0–0.1)
Basophils Relative: 1 %
Eosinophils Absolute: 0 10*3/uL (ref 0.0–0.5)
Eosinophils Relative: 2 %
HCT: 23.4 % — ABNORMAL LOW (ref 36.0–46.0)
Hemoglobin: 7.8 g/dL — ABNORMAL LOW (ref 12.0–15.0)
Immature Granulocytes: 0 %
Lymphocytes Relative: 56 %
Lymphs Abs: 1.5 10*3/uL (ref 0.7–4.0)
MCH: 34.2 pg — ABNORMAL HIGH (ref 26.0–34.0)
MCHC: 33.3 g/dL (ref 30.0–36.0)
MCV: 102.6 fL — ABNORMAL HIGH (ref 80.0–100.0)
Monocytes Absolute: 0.2 10*3/uL (ref 0.1–1.0)
Monocytes Relative: 8 %
Neutro Abs: 0.9 10*3/uL — ABNORMAL LOW (ref 1.7–7.7)
Neutrophils Relative %: 33 %
Platelets: 113 10*3/uL — ABNORMAL LOW (ref 150–400)
RBC: 2.28 MIL/uL — ABNORMAL LOW (ref 3.87–5.11)
RDW: 14.7 % (ref 11.5–15.5)
WBC: 2.7 10*3/uL — ABNORMAL LOW (ref 4.0–10.5)
nRBC: 0 % (ref 0.0–0.2)

## 2019-08-17 LAB — MAGNESIUM: Magnesium: 1.7 mg/dL (ref 1.7–2.4)

## 2019-08-17 MED ORDER — HEPARIN SOD (PORK) LOCK FLUSH 100 UNIT/ML IV SOLN
500.0000 [IU] | Freq: Once | INTRAVENOUS | Status: AC | PRN
  Administered 2019-08-17: 500 [IU]

## 2019-08-17 MED ORDER — SODIUM CHLORIDE 0.9% FLUSH: 10.0000 mL | Freq: Once | INTRAVENOUS | Status: DC | PRN

## 2019-08-17 MED ORDER — SODIUM CHLORIDE 0.9 % IV SOLN
Freq: Once | INTRAVENOUS | Status: AC
  Administered 2019-08-17: 09:00:00 via INTRAVENOUS
  Filled 2019-08-17: qty 1000

## 2019-08-17 NOTE — Patient Instructions (Addendum)
[place] at [facility] Discharge Instructions  You were seen today by Dr. Delton Coombes. He went over your recent lab results. He will hold your treatment today due to your labs. He will give you fluids today and tomorrow. He will see you back in 1 week for labs, treatment and follow up.   Thank you for choosing [place] at [facility] to provide your oncology and hematology care.  To afford each patient quality time with our provider, please arrive at least 15 minutes before your scheduled appointment time.   If you have a lab appointment with the [facility] please come in thru the  Main Entrance and check in at the main information desk  You need to re-schedule your appointment should you arrive 10 or more minutes late.  We strive to give you quality time with our providers, and arriving late affects you and other patients whose appointments are after yours.  Also, if you no show three or more times for appointments you may be dismissed from the clinic at the providers discretion.     Again, thank you for choosing [facility].  Our hope is that these requests will decrease the amount of time that you wait before being seen by our physicians.       _____________________________________________________________  Should you have questions after your visit to [facility], please contact our office at [phone] between the hours of 8:00 a.m. and 4:30 p.m.  Voicemails left after 4:00 p.m. will not be returned until the following business day.  For prescription refill requests, have your pharmacy contact our office and allow 72 hours.    Cancer Center Support Programs:   > Cancer Support Group  2nd Tuesday of the month 1pm-2pm, Journey Room

## 2019-08-17 NOTE — Progress Notes (Signed)
ANC 0.9 today and Hgb 7.8 for today's visit.  Potassium 3.3 and Ser Creat 1.55 for oncology follow up visit.   Patients treatment to be held today and receive hydration today and tomorrow verbal order Dr. Delton Coombes.   Patient tolerated hydration with no complaints voiced.  Port site clean and dry with good blood return noted before and after hydration.  No bruising or swelling noted with port.  Dressing intact.   VSS with discharge and left ambulatory with no s/s of distress noted.

## 2019-08-17 NOTE — Assessment & Plan Note (Signed)
1.  Metastatic cholangiocarcinoma: -Foundation 1 CDX MS-stable, no other actionable mutations. - 8 cycles of gemcitabine and cisplatin from 02/22/2019 through 08/10/2019. -CT CAP after 3 cycles on 04/24/2019 compared to PET scan from 01/17/2019 did not show any change in index lesions in the liver, and enlarged porta hepatic lymph nodes. -Last CEA was 9.9 on 06/15/2019, down from 44 on 05/25/2019. -CT CAP on 07/31/2019 after cycle 7 showed improving liver metastasis. - We reviewed her blood work today.  Creatinine increased to 1.55.  ANC is 900.  We will hold her day 8 of therapy today. -We will give her hydration today and tomorrow.  She was encouraged to drink lots of fluids. -We will see her back next week and plan to give her therapy.  2.  Hypokalemia: -She will continue potassium 20 mEq twice daily.  3.  Health maintenance: -Mammogram on 07/31/2019 was BI-RADS Category 1.

## 2019-08-17 NOTE — Progress Notes (Signed)
Nicole Ibarra [place], [place] [address]   CLINIC:  Medical Oncology/Hematology  PCP:  Nicole Munch, PA-C [place] [address] [phone]   REASON FOR VISIT: Follow-up for nausea and vomiting  CURRENT THERAPY: Cisplatin and gemcitabine.  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[place])   02/22/2019 -  Chemotherapy   The patient had palonosetron (ALOXI) injection 0.25 mg, 0.25 mg, Intravenous,  Once, 8 of 8 cycles Administration: 0.25 mg (02/22/2019), 0.25 mg (03/01/2019), 0.25 mg (03/15/2019), 0.25 mg (03/22/2019), 0.25 mg (04/06/2019), 0.25 mg (04/13/2019), 0.25 mg (04/27/2019), 0.25 mg (05/04/2019), 0.25 mg (05/18/2019), 0.25 mg (05/25/2019), 0.25 mg (06/16/2019), 0.25 mg (06/22/2019), 0.25 mg (07/13/2019), 0.25 mg (07/20/2019), 0.25 mg (08/10/2019) pegfilgrastim-cbqv (UDENYCA) injection 6 mg, 6 mg, Subcutaneous, Once, 8 of 8 cycles Administration: 6 mg (03/02/2019), 6 mg (03/24/2019), 6 mg (05/05/2019), 6 mg (05/26/2019), 6 mg (06/23/2019), 6 mg (07/21/2019) CISplatin (PLATINOL) 52 mg in sodium chloride 0.9 % 250 mL chemo infusion, 25 mg/m2 = 52 mg, Intravenous,  Once, 8 of 8 cycles Administration: 52 mg (02/22/2019), 52 mg (03/01/2019), 52 mg (03/15/2019), 52 mg (03/22/2019), 52 mg (04/06/2019), 52 mg (04/13/2019), 52 mg (04/27/2019), 52 mg (05/04/2019), 52 mg (05/18/2019), 52 mg (05/25/2019), 52 mg (06/16/2019), 52 mg (06/22/2019), 52 mg (07/13/2019), 52 mg (07/20/2019), 52 mg (08/10/2019) gemcitabine (GEMZAR) 2,000 mg in sodium chloride 0.9 % 250 mL chemo infusion, 2,090 mg, Intravenous,  Once, 8 of 8 cycles Dose modification: 750 mg/m2 (75 % of original dose 1,000 mg/m2, Cycle 4, Reason: Other (see comments), Comment: neutropenia), 750 mg/m2 (75 % of original dose 1,000 mg/m2, Cycle 7, Reason: Other (see comments), Comment: neutripenia)  Administration: 2,000 mg (02/22/2019), 2,000 mg (03/01/2019), 2,000 mg (03/15/2019), 2,000 mg (03/22/2019), 2,000 mg (04/06/2019), 2,000 mg (04/13/2019), 2,000 mg (04/27/2019), 1,558 mg (05/04/2019), 2,000 mg (05/18/2019), 2,000 mg (05/25/2019), 2,000 mg (06/16/2019), 2,000 mg (06/22/2019), 2,000 mg (07/13/2019), 1,558 mg (07/20/2019), 2,000 mg (08/10/2019) fosaprepitant (EMEND) 150 mg, dexamethasone (DECADRON) 12 mg in sodium chloride 0.9 % 145 mL IVPB, , Intravenous,  Once, 8 of 8 cycles Administration:  (02/22/2019),  (03/01/2019),  (03/15/2019),  (03/22/2019),  (04/06/2019),  (04/13/2019),  (04/27/2019),  (05/04/2019),  (05/18/2019),  (05/25/2019),  (06/16/2019),  (06/22/2019),  (07/13/2019),  (07/20/2019),  (08/10/2019)  for chemotherapy treatment.       INTERVAL HISTORY:  Nicole Ibarra 63 y.o. female seen for follow-up of metastatic cholangiocarcinoma and cycle 8-day 8 of chemotherapy.  After last treatment last week, she did not experience any GI side effects including nausea, vomiting, diarrhea or constipation.  Denies any tingling or numbness in the extremities.  Denies any ringing in the ears.  Appetite has come down to 25%.  Energy levels are 50%.  Mild fatigue reported.  No fevers or chills reported.  REVIEW OF SYSTEMS:  Review of Systems  Constitutional: Positive for fatigue.  All other systems reviewed and are negative.    PAST MEDICAL/SURGICAL HISTORY:  Past Medical History:  Diagnosis Date  . Anxiety   . Arthritis   . Depression   . Hypertension    Past Surgical History:  Procedure Laterality Date  . COLONOSCOPY N/A 10/23/2016   Procedure: COLONOSCOPY;  Surgeon: Danie Binder, MD;  [facility];  Service: Endoscopy;  Laterality: N/A;  11:30 Am  . POLYPECTOMY  10/23/2016   Procedure: POLYPECTOMY;  Surgeon: Danie Binder, MD;  [facility];  Service: Endoscopy;;  sigmoid colon polyp  . PORTACATH PLACEMENT Left 02/16/2019   Procedure: INSERTION PORT-A-CATH (attached catheter in left  subclavian);  Surgeon: Virl Cagey, MD;  Location: [facility];  Service: General;  Laterality: Left;  . RT HIP SURGERY    . TOTAL HIP ARTHROPLASTY Left 04/07/2016   Procedure: LEFT TOTAL HIP ARTHROPLASTY ANTERIOR APPROACH;  Surgeon: Paralee Cancel, MD;  Location: [facility];  Service: Orthopedics;  Laterality: Left;  Unsuccessful for Spinal, went to General     SOCIAL HISTORY:  Social History   Socioeconomic History  . Marital status: Single    Spouse name: Not on file  . Number of children: Not on file  . Years of education: Not on file  . Highest education level: Not on file  Occupational History  . Not on file  Social Needs  . Financial resource strain: Not hard at all  . Food insecurity    Worry: Never true    Inability: Never true  . Transportation needs    Medical: No    Non-medical: No  Tobacco Use  . Smoking status: Current Every Day Smoker    Packs/day: 0.25    Years: 42.00    Pack years: 10.50  . Smokeless tobacco: Never Used  Substance and Sexual Activity  . Alcohol use: No  . Drug use: No  . Sexual activity: Not Currently  Lifestyle  . Physical activity    Days per week: 0 days    Minutes per session: 0 min  . Stress: Only a little  Relationships  . Social Herbalist on phone: Three times a week    Gets together: Three times a week    Attends religious service: More than 4 times per year    Active member of club or organization: Yes    Attends meetings of clubs or organizations: More than 4 times per year    Relationship status: Never married  . Intimate partner violence    Fear of current or ex partner: No    Emotionally abused: No    Physically abused: No    Forced sexual activity: No  Other Topics Concern  . Not on file  Social History Narrative  . Not on file    FAMILY HISTORY:  Family History  Problem Relation Age of Onset  . Hypertension Mother   . Cancer Father   . Hypertension Brother   . Cancer Brother   . Hypertension  Sister   . Cancer Sister     CURRENT MEDICATIONS:  Outpatient Encounter Medications as of 08/17/2019  Medication Sig  . amLODipine (NORVASC) 10 MG tablet Take 1 tablet (10 mg total) by mouth daily.  Marland Kitchen CISPLATIN IV Inject into the vein. Day 1, 8 every 21 days  . doxazosin (CARDURA) 2 MG tablet Take 2 mg by mouth every evening.   Marland Kitchen GEMCITABINE HCL IV Inject into the vein. Day 1, 8 every 21 days  . lidocaine (XYLOCAINE) 2 % solution Swish and swallow 1 tablespoon four times a day prn sore mouth (Patient not taking: Reported on 08/10/2019)  . lidocaine-prilocaine (EMLA) cream Apply to skin over port a cath one hour prior to chemotherapy appointment (Patient not taking: Reported on 08/10/2019)  . naproxen sodium (ALEVE) 220 MG tablet Take 220 mg by mouth 2 (two) times daily as needed.   . prochlorperazine (COMPAZINE) 10 MG tablet TAKE 1 TABLET (10 MG TOTAL) BY MOUTH EVERY 6 (SIX) HOURS  AS NEEDED (NAUSEA OR VOMITING). (Patient not taking: Reported on 08/10/2019)  . vitamin B-12 (CYANOCOBALAMIN) 50 MCG tablet Take 50 mcg by mouth daily.   No facility-administered encounter medications on file as of 08/17/2019.     ALLERGIES:  No Known Allergies   PHYSICAL EXAM:  ECOG Performance status: 1  Vitals:   08/17/19 0802  BP: (!) 153/60  Pulse: 76  Resp: 18  Temp: (!) 97.3 F (36.3 C)  SpO2: 100%   Filed Weights   08/17/19 0802  Weight: 199 lb 6.4 oz (90.4 kg)    Physical Exam Constitutional:      Appearance: Normal appearance. She is normal weight.  Cardiovascular:     Rate and Rhythm: Normal rate and regular rhythm.     Heart sounds: Normal heart sounds.  Pulmonary:     Effort: Pulmonary effort is normal.     Breath sounds: Normal breath sounds.  Abdominal:     General: Bowel sounds are normal.     Palpations: Abdomen is soft.  Musculoskeletal: Normal range of motion.  Skin:    General: Skin is warm and dry.  Neurological:     Mental Status: She is alert and oriented to person,  place, and time. Mental status is at baseline.  Psychiatric:        Mood and Affect: Mood normal.        Behavior: Behavior normal.        Thought Content: Thought content normal.        Judgment: Judgment normal.      LABORATORY DATA:  I have reviewed the labs as listed.  CBC    Component Value Date/Time   WBC 2.7 (L) 08/17/2019 0806   RBC 2.28 (L) 08/17/2019 0806   HGB 7.8 (L) 08/17/2019 0806   HCT 23.4 (L) 08/17/2019 0806   PLT 113 (L) 08/17/2019 0806   MCV 102.6 (H) 08/17/2019 0806   MCH 34.2 (H) 08/17/2019 0806   MCHC 33.3 08/17/2019 0806   RDW 14.7 08/17/2019 0806   LYMPHSABS 1.5 08/17/2019 0806   MONOABS 0.2 08/17/2019 0806   EOSABS 0.0 08/17/2019 0806   BASOSABS 0.0 08/17/2019 0806   CMP Latest Ref Rng & Units 08/17/2019 08/10/2019 08/03/2019  Glucose 70 - 99 mg/dL 100(H) 101(H) 105(H)  BUN 8 - 23 mg/dL 30(H) 16 17  Creatinine 0.44 - 1.00 mg/dL 1.55(H) 1.39(H) 1.56(H)  Sodium 135 - 145 mmol/L 137 138 140  Potassium 3.5 - 5.1 mmol/L 3.3(L) 3.7 3.5  Chloride 98 - 111 mmol/L 102 103 106  CO2 22 - 32 mmol/L 25 24 25   Calcium 8.9 - 10.3 mg/dL 9.1 9.4 9.3  Total Protein 6.5 - 8.1 g/dL 6.7 7.1 6.9  Total Bilirubin 0.3 - 1.2 mg/dL 0.6 0.3 0.5  Alkaline Phos 38 - 126 U/L 77 88 101  AST 15 - 41 U/L 14(L) 13(L) 14(L)  ALT 0 - 44 U/L 11 9 8     I have independently reviewed scans and discussed with the patient.   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  Metastatic cholangiocarcinoma: -Foundation 1 CDX MS-stable, no other actionable mutations. - 8 cycles of gemcitabine and cisplatin from 02/22/2019 through 08/10/2019. -CT CAP after 3 cycles on 04/24/2019 compared to PET scan from 01/17/2019 did not show any change in index lesions in the liver, and enlarged porta hepatic lymph nodes. -Last CEA was 9.9 on 06/15/2019, down from 44 on 05/25/2019. -CT CAP on 07/31/2019 after cycle 7 showed improving  liver metastasis. - We reviewed her blood work today.   Creatinine increased to 1.55.  ANC is 900.  We will hold her day 8 of therapy today. -We will give her hydration today and tomorrow.  She was encouraged to drink lots of fluids. -We will see her back next week and plan to give her therapy.  2.  Hypokalemia: -She will continue potassium 20 mEq twice daily.  3.  Health maintenance: -Mammogram on 07/31/2019 was BI-RADS Category 1.   Total time spent is 25 minutes with more than 50% of the time spent face-to-face discussing treatment plan, counseling and coordination of care.  Orders placed this encounter:  No orders of the defined types were placed in this encounter.     Derek Jack, MD [place] [phone]

## 2019-08-18 ENCOUNTER — Inpatient Hospital Stay (HOSPITAL_COMMUNITY): Payer: Medicare Other

## 2019-08-18 ENCOUNTER — Encounter (HOSPITAL_COMMUNITY): Payer: Self-pay

## 2019-08-18 ENCOUNTER — Other Ambulatory Visit: Payer: Self-pay

## 2019-08-18 VITALS — BP 145/70 | HR 65 | Temp 97.9°F | Resp 18

## 2019-08-18 DIAGNOSIS — C221 Intrahepatic bile duct carcinoma: Secondary | ICD-10-CM

## 2019-08-18 DIAGNOSIS — Z5111 Encounter for antineoplastic chemotherapy: Secondary | ICD-10-CM | POA: Diagnosis not present

## 2019-08-18 MED ORDER — HEPARIN SOD (PORK) LOCK FLUSH 100 UNIT/ML IV SOLN
500.0000 [IU] | Freq: Once | INTRAVENOUS | Status: AC | PRN
  Administered 2019-08-18: 12:00:00 500 [IU]

## 2019-08-18 MED ORDER — SODIUM CHLORIDE 0.9 % IV SOLN
Freq: Once | INTRAVENOUS | Status: AC
  Administered 2019-08-18: 10:00:00 via INTRAVENOUS
  Filled 2019-08-18: qty 1000

## 2019-08-18 MED ORDER — SODIUM CHLORIDE 0.9% FLUSH
10.0000 mL | Freq: Once | INTRAVENOUS | Status: AC | PRN
  Administered 2019-08-18: 10 mL

## 2019-08-18 NOTE — Patient Instructions (Signed)
[facility] at [facility] Discharge Instructions  Received IV hydration with magnesium and potassium today. Follow-up as scheduled. Call clinic for any questions or concerns   Thank you for choosing [facility] at [facility] to provide your oncology and hematology care.  To afford each patient quality time with our provider, please arrive at least 15 minutes before your scheduled appointment time.   If you have a lab appointment with the Cancer Center please come in thru the Main Entrance and check in at the main information desk.  You need to re-schedule your appointment should you arrive 10 or more minutes late.  We strive to give you quality time with our providers, and arriving late affects you and other patients whose appointments are after yours.  Also, if you no show three or more times for appointments you may be dismissed from the clinic at the providers discretion.     Again, thank you for choosing [facility].  Our hope is that these requests will decrease the amount of time that you wait before being seen by our physicians.       _____________________________________________________________  Should you have questions after your visit to [facility], please contact our office at [phone] between the hours of 8:00 a.m. and 4:30 p.m.  Voicemails left after 4:00 p.m. will not be returned until the following business day.  For prescription refill requests, have your pharmacy contact our office and allow 72 hours.    Due to Covid, you will need to wear a mask upon entering the hospital. If you do not have a mask, a mask will be given to you at the Main Entrance upon arrival. For doctor visits, patients may have 1 support person with them. For treatment visits, patients can not have anyone with them due to social distancing guidelines and our immunocompromised population.     

## 2019-08-18 NOTE — Progress Notes (Signed)
Nicole Ibarra tolerated IV hydration with magnesium and potassium well without complaints or incident. VSS upon discharge. Pt discharged self ambulatory in satisfactory condition

## 2019-08-24 ENCOUNTER — Inpatient Hospital Stay (HOSPITAL_BASED_OUTPATIENT_CLINIC_OR_DEPARTMENT_OTHER): Payer: Medicare Other | Admitting: Hematology

## 2019-08-24 ENCOUNTER — Inpatient Hospital Stay (HOSPITAL_COMMUNITY): Payer: Medicare Other

## 2019-08-24 ENCOUNTER — Other Ambulatory Visit: Payer: Self-pay

## 2019-08-24 VITALS — BP 151/57 | HR 67 | Temp 96.8°F | Resp 18 | Wt 202.8 lb

## 2019-08-24 DIAGNOSIS — Z5111 Encounter for antineoplastic chemotherapy: Secondary | ICD-10-CM | POA: Diagnosis not present

## 2019-08-24 DIAGNOSIS — C221 Intrahepatic bile duct carcinoma: Secondary | ICD-10-CM

## 2019-08-24 DIAGNOSIS — C787 Secondary malignant neoplasm of liver and intrahepatic bile duct: Secondary | ICD-10-CM

## 2019-08-24 LAB — COMPREHENSIVE METABOLIC PANEL
ALT: 7 U/L (ref 0–44)
AST: 14 U/L — ABNORMAL LOW (ref 15–41)
Albumin: 3.9 g/dL (ref 3.5–5.0)
Alkaline Phosphatase: 79 U/L (ref 38–126)
Anion gap: 10 (ref 5–15)
BUN: 18 mg/dL (ref 8–23)
CO2: 24 mmol/L (ref 22–32)
Calcium: 9.3 mg/dL (ref 8.9–10.3)
Chloride: 103 mmol/L (ref 98–111)
Creatinine, Ser: 1.43 mg/dL — ABNORMAL HIGH (ref 0.44–1.00)
GFR calc Af Amer: 45 mL/min — ABNORMAL LOW (ref >60)
GFR calc non Af Amer: 39 mL/min — ABNORMAL LOW (ref >60)
Glucose, Bld: 97 mg/dL (ref 70–99)
Potassium: 3.4 mmol/L — ABNORMAL LOW (ref 3.5–5.1)
Sodium: 137 mmol/L (ref 135–145)
Total Bilirubin: 0.3 mg/dL (ref 0.3–1.2)
Total Protein: 6.9 g/dL (ref 6.5–8.1)

## 2019-08-24 LAB — CBC WITH DIFFERENTIAL/PLATELET
Abs Immature Granulocytes: 0.01 10*3/uL (ref 0.00–0.07)
Basophils Absolute: 0 10*3/uL (ref 0.0–0.1)
Basophils Relative: 1 %
Eosinophils Absolute: 0.2 10*3/uL (ref 0.0–0.5)
Eosinophils Relative: 4 %
HCT: 24.7 % — ABNORMAL LOW (ref 36.0–46.0)
Hemoglobin: 8.2 g/dL — ABNORMAL LOW (ref 12.0–15.0)
Immature Granulocytes: 0 %
Lymphocytes Relative: 44 %
Lymphs Abs: 2.3 10*3/uL (ref 0.7–4.0)
MCH: 34.6 pg — ABNORMAL HIGH (ref 26.0–34.0)
MCHC: 33.2 g/dL (ref 30.0–36.0)
MCV: 104.2 fL — ABNORMAL HIGH (ref 80.0–100.0)
Monocytes Absolute: 0.5 10*3/uL (ref 0.1–1.0)
Monocytes Relative: 10 %
Neutro Abs: 2.1 10*3/uL (ref 1.7–7.7)
Neutrophils Relative %: 41 %
Platelets: 114 10*3/uL — ABNORMAL LOW (ref 150–400)
RBC: 2.37 MIL/uL — ABNORMAL LOW (ref 3.87–5.11)
RDW: 15.1 % (ref 11.5–15.5)
WBC: 5.1 10*3/uL (ref 4.0–10.5)
nRBC: 0 % (ref 0.0–0.2)

## 2019-08-24 LAB — MAGNESIUM: Magnesium: 1.8 mg/dL (ref 1.7–2.4)

## 2019-08-24 MED ORDER — SODIUM CHLORIDE 0.9 % IV SOLN
2000.0000 mg | Freq: Once | INTRAVENOUS | Status: AC
  Administered 2019-08-24: 2000 mg via INTRAVENOUS
  Filled 2019-08-24: qty 52.6

## 2019-08-24 MED ORDER — PALONOSETRON HCL INJECTION 0.25 MG/5ML
0.2500 mg | Freq: Once | INTRAVENOUS | Status: AC
  Administered 2019-08-24: 0.25 mg via INTRAVENOUS
  Filled 2019-08-24: qty 5

## 2019-08-24 MED ORDER — SODIUM CHLORIDE 0.9 % IV SOLN
25.0000 mg/m2 | Freq: Once | INTRAVENOUS | Status: AC
  Administered 2019-08-24: 52 mg via INTRAVENOUS
  Filled 2019-08-24: qty 52

## 2019-08-24 MED ORDER — SODIUM CHLORIDE 0.9% FLUSH
10.0000 mL | INTRAVENOUS | Status: DC | PRN
  Administered 2019-08-24: 08:00:00 10 mL
  Filled 2019-08-24: qty 10

## 2019-08-24 MED ORDER — SODIUM CHLORIDE 0.9 % IV SOLN
Freq: Once | INTRAVENOUS | Status: AC
  Administered 2019-08-24: 11:00:00 via INTRAVENOUS
  Filled 2019-08-24: qty 5

## 2019-08-24 MED ORDER — SODIUM CHLORIDE 0.9 % IV SOLN
Freq: Once | INTRAVENOUS | Status: AC
  Administered 2019-08-24: 09:00:00 via INTRAVENOUS
  Filled 2019-08-24: qty 1000

## 2019-08-24 MED ORDER — SODIUM CHLORIDE 0.9 % IV SOLN
Freq: Once | INTRAVENOUS | Status: AC
  Administered 2019-08-24: 09:00:00 via INTRAVENOUS

## 2019-08-24 MED ORDER — HEPARIN SOD (PORK) LOCK FLUSH 100 UNIT/ML IV SOLN
500.0000 [IU] | Freq: Once | INTRAVENOUS | Status: AC | PRN
  Administered 2019-08-24: 500 [IU]

## 2019-08-24 MED ORDER — SODIUM CHLORIDE 0.9 % IV SOLN
Freq: Once | INTRAVENOUS | Status: AC
  Administered 2019-08-24: 13:00:00 via INTRAVENOUS

## 2019-08-24 NOTE — Progress Notes (Signed)
[place], [place] [address]   CLINIC:  Medical Oncology/Hematology  PCP:  Cory Munch, PA-C [place] [address] [phone]   REASON FOR VISIT: Follow-up for nausea and vomiting  CURRENT THERAPY: Cisplatin and gemcitabine.  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[place])   02/22/2019 -  Chemotherapy   The patient had palonosetron (ALOXI) injection 0.25 mg, 0.25 mg, Intravenous,  Once, 8 of 9 cycles Administration: 0.25 mg (02/22/2019), 0.25 mg (03/01/2019), 0.25 mg (03/15/2019), 0.25 mg (03/22/2019), 0.25 mg (04/06/2019), 0.25 mg (04/13/2019), 0.25 mg (04/27/2019), 0.25 mg (05/04/2019), 0.25 mg (05/18/2019), 0.25 mg (05/25/2019), 0.25 mg (06/16/2019), 0.25 mg (06/22/2019), 0.25 mg (07/13/2019), 0.25 mg (07/20/2019), 0.25 mg (08/10/2019), 0.25 mg (08/24/2019) pegfilgrastim-cbqv (UDENYCA) injection 6 mg, 6 mg, Subcutaneous, Once, 8 of 9 cycles Administration: 6 mg (03/02/2019), 6 mg (03/24/2019), 6 mg (05/05/2019), 6 mg (05/26/2019), 6 mg (06/23/2019), 6 mg (07/21/2019), 6 mg (08/25/2019) CISplatin (PLATINOL) 52 mg in sodium chloride 0.9 % 250 mL chemo infusion, 25 mg/m2 = 52 mg, Intravenous,  Once, 8 of 9 cycles Administration: 52 mg (02/22/2019), 52 mg (03/01/2019), 52 mg (03/15/2019), 52 mg (03/22/2019), 52 mg (04/06/2019), 52 mg (04/13/2019), 52 mg (04/27/2019), 52 mg (05/04/2019), 52 mg (05/18/2019), 52 mg (05/25/2019), 52 mg (06/16/2019), 52 mg (06/22/2019), 52 mg (07/13/2019), 52 mg (07/20/2019), 52 mg (08/10/2019), 52 mg (08/24/2019) gemcitabine (GEMZAR) 2,000 mg in sodium chloride 0.9 % 250 mL chemo infusion, 2,090 mg, Intravenous,  Once, 8 of 9 cycles Dose modification: 750 mg/m2 (75 % of original dose 1,000 mg/m2, Cycle 4, Reason: Other (see comments), Comment: neutropenia), 750 mg/m2 (75 % of original dose 1,000 mg/m2, Cycle 7, Reason:  Other (see comments), Comment: neutripenia) Administration: 2,000 mg (02/22/2019), 2,000 mg (03/01/2019), 2,000 mg (03/15/2019), 2,000 mg (03/22/2019), 2,000 mg (04/06/2019), 2,000 mg (04/13/2019), 2,000 mg (04/27/2019), 1,558 mg (05/04/2019), 2,000 mg (05/18/2019), 2,000 mg (05/25/2019), 2,000 mg (06/16/2019), 2,000 mg (06/22/2019), 2,000 mg (07/13/2019), 1,558 mg (07/20/2019), 2,000 mg (08/10/2019), 2,000 mg (08/24/2019) fosaprepitant (EMEND) 150 mg, dexamethasone (DECADRON) 12 mg in sodium chloride 0.9 % 145 mL IVPB, , Intravenous,  Once, 8 of 9 cycles Administration:  (02/22/2019),  (03/01/2019),  (03/15/2019),  (03/22/2019),  (04/06/2019),  (04/13/2019),  (04/27/2019),  (05/04/2019),  (05/18/2019),  (05/25/2019),  (06/16/2019),  (06/22/2019),  (07/13/2019),  (07/20/2019),  (08/10/2019),  (08/24/2019)  for chemotherapy treatment.       INTERVAL HISTORY:  Ms. Marrero 63 y.o. female seen for follow-up of metastatic cholangiocarcinoma to the liver.  She had cycle 8-day 1 of therapy on 08/10/2019.  Since then she denied any nausea, vomiting, diarrhea or constipation.  Appetite and energy levels are 100%.  Denies any tingling or numbness next 20s.  Denies any ringing in the ears.  No fevers or chills reported.  She reports that she is drinking 212 ounce bottles of water.  REVIEW OF SYSTEMS:  Review of Systems  All other systems reviewed and are negative.    PAST MEDICAL/SURGICAL HISTORY:  Past Medical History:  Diagnosis Date  . Anxiety   . Arthritis   . Depression   . Hypertension    Past Surgical History:  Procedure Laterality Date  . COLONOSCOPY N/A 10/23/2016   Procedure: COLONOSCOPY;  Surgeon: Danie Binder, MD;  [facility];  Service: Endoscopy;  Laterality: N/A;  11:30 Am  . POLYPECTOMY  10/23/2016  Procedure: POLYPECTOMY;  Surgeon: Danie Binder, MD;  [facility];  Service: Endoscopy;;  sigmoid colon polyp  . PORTACATH PLACEMENT Left 02/16/2019   Procedure: INSERTION PORT-A-CATH (attached  catheter in left subclavian);  Surgeon: Virl Cagey, MD;  Location: [facility];  Service: General;  Laterality: Left;  . RT HIP SURGERY    . TOTAL HIP ARTHROPLASTY Left 04/07/2016   Procedure: LEFT TOTAL HIP ARTHROPLASTY ANTERIOR APPROACH;  Surgeon: Paralee Cancel, MD;  Location[facility];  Service: Orthopedics;  Laterality: Left;  Unsuccessful for Spinal, went to General     SOCIAL HISTORY:  Social History   Socioeconomic History  . Marital status: Single    Spouse name: Not on file  . Number of children: Not on file  . Years of education: Not on file  . Highest education level: Not on file  Occupational History  . Not on file  Social Needs  . Financial resource strain: Not hard at all  . Food insecurity    Worry: Never true    Inability: Never true  . Transportation needs    Medical: No    Non-medical: No  Tobacco Use  . Smoking status: Current Every Day Smoker    Packs/day: 0.25    Years: 42.00    Pack years: 10.50  . Smokeless tobacco: Never Used  Substance and Sexual Activity  . Alcohol use: No  . Drug use: No  . Sexual activity: Not Currently  Lifestyle  . Physical activity    Days per week: 0 days    Minutes per session: 0 min  . Stress: Only a little  Relationships  . Social Herbalist on phone: Three times a week    Gets together: Three times a week    Attends religious service: More than 4 times per year    Active member of club or organization: Yes    Attends meetings of clubs or organizations: More than 4 times per year    Relationship status: Never married  . Intimate partner violence    Fear of current or ex partner: No    Emotionally abused: No    Physically abused: No    Forced sexual activity: No  Other Topics Concern  . Not on file  Social History Narrative  . Not on file    FAMILY HISTORY:  Family History  Problem Relation Age of Onset  . Hypertension Mother   . Cancer Father   . Hypertension Brother   . Cancer Brother   .  Hypertension Sister   . Cancer Sister     CURRENT MEDICATIONS:  Outpatient Encounter Medications as of 08/24/2019  Medication Sig  . amLODipine (NORVASC) 10 MG tablet Take 1 tablet (10 mg total) by mouth daily.  Marland Kitchen CISPLATIN IV Inject into the vein. Day 1, 8 every 21 days  . doxazosin (CARDURA) 2 MG tablet Take 2 mg by mouth every evening.   Marland Kitchen GEMCITABINE HCL IV Inject into the vein. Day 1, 8 every 21 days  . lidocaine (XYLOCAINE) 2 % solution Swish and swallow 1 tablespoon four times a day prn sore mouth  . lidocaine-prilocaine (EMLA) cream Apply to skin over port a cath one hour prior to chemotherapy appointment  . naproxen sodium (ALEVE) 220 MG tablet Take 220 mg by mouth 2 (two) times daily as needed.   . prochlorperazine (COMPAZINE) 10 MG tablet TAKE 1 TABLET (10 MG TOTAL) BY MOUTH EVERY 6 (SIX) HOURS AS NEEDED (NAUSEA  OR VOMITING).  . vitamin B-12 (CYANOCOBALAMIN) 50 MCG tablet Take 50 mcg by mouth daily.  . [EXPIRED] 0.9 %  sodium chloride infusion   . [EXPIRED] 0.9 %  sodium chloride infusion   . [EXPIRED] heparin lock flush 100 unit/mL   . [EXPIRED] sodium chloride 0.9 % 1,000 mL with potassium chloride 20 mEq, magnesium sulfate 2 g infusion   . [DISCONTINUED] sodium chloride flush (NS) 0.9 % injection 10 mL    No facility-administered encounter medications on file as of 08/24/2019.     ALLERGIES:  No Known Allergies   PHYSICAL EXAM:  ECOG Performance status: 1  Vitals:   08/24/19 0800  BP: (!) 155/61  Pulse: (!) 57  Resp: 18  Temp: (!) 96.8 F (36 C)  SpO2: 100%   Filed Weights   08/24/19 0800  Weight: 202 lb 12.8 oz (92 kg)    Physical Exam Constitutional:      Appearance: Normal appearance. She is normal weight.  Cardiovascular:     Rate and Rhythm: Normal rate and regular rhythm.     Heart sounds: Normal heart sounds.  Pulmonary:     Effort: Pulmonary effort is normal.     Breath sounds: Normal breath sounds.  Abdominal:     General: Bowel sounds are  normal.     Palpations: Abdomen is soft.  Musculoskeletal: Normal range of motion.  Skin:    General: Skin is warm and dry.  Neurological:     Mental Status: She is alert and oriented to person, place, and time. Mental status is at baseline.  Psychiatric:        Mood and Affect: Mood normal.        Behavior: Behavior normal.        Thought Content: Thought content normal.        Judgment: Judgment normal.      LABORATORY DATA:  I have reviewed the labs as listed.  CBC    Component Value Date/Time   WBC 5.1 08/24/2019 0748   RBC 2.37 (L) 08/24/2019 0748   HGB 8.2 (L) 08/24/2019 0748   HCT 24.7 (L) 08/24/2019 0748   PLT 114 (L) 08/24/2019 0748   MCV 104.2 (H) 08/24/2019 0748   MCH 34.6 (H) 08/24/2019 0748   MCHC 33.2 08/24/2019 0748   RDW 15.1 08/24/2019 0748   LYMPHSABS 2.3 08/24/2019 0748   MONOABS 0.5 08/24/2019 0748   EOSABS 0.2 08/24/2019 0748   BASOSABS 0.0 08/24/2019 0748   CMP Latest Ref Rng & Units 08/24/2019 08/17/2019 08/10/2019  Glucose 70 - 99 mg/dL 97 100(H) 101(H)  BUN 8 - 23 mg/dL 18 30(H) 16  Creatinine 0.44 - 1.00 mg/dL 1.43(H) 1.55(H) 1.39(H)  Sodium 135 - 145 mmol/L 137 137 138  Potassium 3.5 - 5.1 mmol/L 3.4(L) 3.3(L) 3.7  Chloride 98 - 111 mmol/L 103 102 103  CO2 22 - 32 mmol/L 24 25 24   Calcium 8.9 - 10.3 mg/dL 9.3 9.1 9.4  Total Protein 6.5 - 8.1 g/dL 6.9 6.7 7.1  Total Bilirubin 0.3 - 1.2 mg/dL 0.3 0.6 0.3  Alkaline Phos 38 - 126 U/L 79 77 88  AST 15 - 41 U/L 14(L) 14(L) 13(L)  ALT 0 - 44 U/L 7 11 9     I have independently reviewed scans and discussed with the patient.   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  Metastatic cholangiocarcinoma: -Foundation 1 CDX MS-stable, no other actionable mutations. - 8 cycles of gemcitabine and cisplatin from  02/22/2019 through 08/10/2019. -CT CAP after 3 cycles on 04/24/2019 compared to PET scan from 01/17/2019 did not show any change in index lesions in the liver, and enlarged  porta hepatic lymph nodes. -Last CEA was 9.9 on 06/15/2019, down from 44 on 05/25/2019. -CT CAP on 07/31/2019 after cycle 7 showed improving liver metastasis. - She did not have any major complaints.  We reviewed her blood work.  Creatinine is 1.43.  White count and platelets are normal. - She will proceed with day 8 of cycle 8 today.  She will come back tomorrow for IV hydration. -We will see her back in 2 weeks for follow-up.  2.  Hypokalemia: -She will continue potassium 20 mEq twice daily.  3.  Health maintenance: -Mammogram on 07/31/2019 was BI-RADS Category 1.   Total time spent is 25 minutes with more than 50% of the time spent face-to-face discussing treatment plan, counseling and coordination of care.  Orders placed this encounter:  No orders of the defined types were placed in this encounter.     Derek Jack, MD [place] [phone]

## 2019-08-24 NOTE — Patient Instructions (Addendum)
[facility] at [facility] Discharge Instructions  You were seen today by Dr. Katragadda. He went over your recent lab results. He will see you back in 2 weeks for labs and follow up.   Thank you for choosing [facility] at [facility] to provide your oncology and hematology care.  To afford each patient quality time with our provider, please arrive at least 15 minutes before your scheduled appointment time.   If you have a lab appointment with the Cancer Center please come in thru the  Main Entrance and check in at the main information desk  You need to re-schedule your appointment should you arrive 10 or more minutes late.  We strive to give you quality time with our providers, and arriving late affects you and other patients whose appointments are after yours.  Also, if you no show three or more times for appointments you may be dismissed from the clinic at the providers discretion.     Again, thank you for choosing [facility].  Our hope is that these requests will decrease the amount of time that you wait before being seen by our physicians.       _____________________________________________________________  Should you have questions after your visit to [facility], please contact our office at [phone] between the hours of 8:00 a.m. and 4:30 p.m.  Voicemails left after 4:00 p.m. will not be returned until the following business day.  For prescription refill requests, have your pharmacy contact our office and allow 72 hours.    Cancer Center Support Programs:   > Cancer Support Group  2nd Tuesday of the month 1pm-2pm, Journey Room    

## 2019-08-24 NOTE — Progress Notes (Signed)
Patient to room for oncology follow up visit and treatment.  Reviewed side effects of treatment with understanding verbalized by the patient.  Denied any hearing changes or ringing in ears.   No s/s of distress noted.    Patient seen by Dr. Delton Coombes with lab review and ok to treat today verbal order.   Patient tolerated chemotherapy with no complaints voiced.  Port site clean and dry with no bruising or swelling noted at site.  Good blood return noted before and after administration of chemotherapy.  Dressing intact.   Patient left ambulatory with VSS and no s/s of distress noted.

## 2019-08-25 ENCOUNTER — Inpatient Hospital Stay (HOSPITAL_COMMUNITY): Payer: Medicare Other

## 2019-08-25 VITALS — BP 161/48 | HR 80 | Temp 96.8°F | Resp 18

## 2019-08-25 DIAGNOSIS — Z5111 Encounter for antineoplastic chemotherapy: Secondary | ICD-10-CM | POA: Diagnosis not present

## 2019-08-25 DIAGNOSIS — C221 Intrahepatic bile duct carcinoma: Secondary | ICD-10-CM

## 2019-08-25 MED ORDER — SODIUM CHLORIDE 0.9 % IV SOLN
Freq: Once | INTRAVENOUS | Status: AC
  Administered 2019-08-25: 10:00:00 via INTRAVENOUS
  Filled 2019-08-25: qty 1000

## 2019-08-25 MED ORDER — PEGFILGRASTIM-CBQV 6 MG/0.6ML ~~LOC~~ SOSY
6.0000 mg | PREFILLED_SYRINGE | Freq: Once | SUBCUTANEOUS | Status: AC
  Administered 2019-08-25: 10:00:00 6 mg via SUBCUTANEOUS

## 2019-08-25 MED ORDER — PEGFILGRASTIM-CBQV 6 MG/0.6ML ~~LOC~~ SOSY
PREFILLED_SYRINGE | SUBCUTANEOUS | Status: AC
  Filled 2019-08-25: qty 0.6

## 2019-08-25 MED ORDER — SODIUM CHLORIDE 0.9% FLUSH
10.0000 mL | Freq: Once | INTRAVENOUS | Status: AC | PRN
  Administered 2019-08-25: 10 mL

## 2019-08-25 MED ORDER — HEPARIN SOD (PORK) LOCK FLUSH 100 UNIT/ML IV SOLN
500.0000 [IU] | Freq: Once | INTRAVENOUS | Status: AC | PRN
  Administered 2019-08-25: 500 [IU]

## 2019-08-25 NOTE — Patient Instructions (Signed)
[facility] at [facility]  Discharge Instructions:  Nicole Ibarra and hydration fluids today.  _______________________________________________________________  Thank you for choosing [place] at [facility] to provide your oncology and hematology care.  To afford each patient quality time with our providers, please arrive at least 15 minutes before your scheduled appointment.  You need to re-schedule your appointment if you arrive 10 or more minutes late.  We strive to give you quality time with our providers, and arriving late affects you and other patients whose appointments are after yours.  Also, if you no show three or more times for appointments you may be dismissed from the clinic.  Again, thank you for choosing [place] [facility] [place] hope is that these requests will allow you access to exceptional care and in a timely manner. _______________________________________________________________  If you have questions after your visit, please contact our office at [phone] between the hours of 8:30 a.m. and 5:00 p.m. Voicemails left after 4:30 p.m. will not be returned until the following business day. _______________________________________________________________  For prescription refill requests, have your pharmacy contact our office. _______________________________________________________________  Recommendations made by the consultant and any test results will be sent to your referring physician. _______________________________________________________________

## 2019-08-30 ENCOUNTER — Other Ambulatory Visit (HOSPITAL_COMMUNITY): Payer: Self-pay | Admitting: Nurse Practitioner

## 2019-08-30 ENCOUNTER — Encounter (HOSPITAL_COMMUNITY): Payer: Self-pay | Admitting: Hematology

## 2019-08-30 DIAGNOSIS — C221 Intrahepatic bile duct carcinoma: Secondary | ICD-10-CM

## 2019-08-30 DIAGNOSIS — R112 Nausea with vomiting, unspecified: Secondary | ICD-10-CM

## 2019-08-30 NOTE — Assessment & Plan Note (Signed)
1.  Metastatic cholangiocarcinoma: -Foundation 1 CDX MS-stable, no other actionable mutations. - 8 cycles of gemcitabine and cisplatin from 02/22/2019 through 08/10/2019. -CT CAP after 3 cycles on 04/24/2019 compared to PET scan from 01/17/2019 did not show any change in index lesions in the liver, and enlarged porta hepatic lymph nodes. -Last CEA was 9.9 on 06/15/2019, down from 44 on 05/25/2019. -CT CAP on 07/31/2019 after cycle 7 showed improving liver metastasis. - She did not have any major complaints.  We reviewed her blood work.  Creatinine is 1.43.  White count and platelets are normal. - She will proceed with day 8 of cycle 8 today.  She will come back tomorrow for IV hydration. -We will see her back in 2 weeks for follow-up.  2.  Hypokalemia: -She will continue potassium 20 mEq twice daily.  3.  Health maintenance: -Mammogram on 07/31/2019 was BI-RADS Category 1.

## 2019-09-07 ENCOUNTER — Inpatient Hospital Stay (HOSPITAL_BASED_OUTPATIENT_CLINIC_OR_DEPARTMENT_OTHER): Payer: Medicare Other | Admitting: Hematology

## 2019-09-07 ENCOUNTER — Other Ambulatory Visit: Payer: Self-pay

## 2019-09-07 ENCOUNTER — Inpatient Hospital Stay (HOSPITAL_COMMUNITY): Payer: Medicare Other | Attending: Hematology

## 2019-09-07 ENCOUNTER — Encounter (HOSPITAL_COMMUNITY): Payer: Self-pay | Admitting: Hematology

## 2019-09-07 ENCOUNTER — Inpatient Hospital Stay (HOSPITAL_COMMUNITY): Payer: Medicare Other

## 2019-09-07 VITALS — BP 148/64 | HR 60 | Resp 16

## 2019-09-07 DIAGNOSIS — C787 Secondary malignant neoplasm of liver and intrahepatic bile duct: Secondary | ICD-10-CM | POA: Insufficient documentation

## 2019-09-07 DIAGNOSIS — R970 Elevated carcinoembryonic antigen [CEA]: Secondary | ICD-10-CM | POA: Insufficient documentation

## 2019-09-07 DIAGNOSIS — K5900 Constipation, unspecified: Secondary | ICD-10-CM | POA: Insufficient documentation

## 2019-09-07 DIAGNOSIS — Z5189 Encounter for other specified aftercare: Secondary | ICD-10-CM | POA: Insufficient documentation

## 2019-09-07 DIAGNOSIS — Z8249 Family history of ischemic heart disease and other diseases of the circulatory system: Secondary | ICD-10-CM | POA: Diagnosis not present

## 2019-09-07 DIAGNOSIS — Z809 Family history of malignant neoplasm, unspecified: Secondary | ICD-10-CM | POA: Diagnosis not present

## 2019-09-07 DIAGNOSIS — E876 Hypokalemia: Secondary | ICD-10-CM | POA: Diagnosis not present

## 2019-09-07 DIAGNOSIS — R5383 Other fatigue: Secondary | ICD-10-CM | POA: Insufficient documentation

## 2019-09-07 DIAGNOSIS — C969 Malignant neoplasm of lymphoid, hematopoietic and related tissue, unspecified: Secondary | ICD-10-CM | POA: Diagnosis present

## 2019-09-07 DIAGNOSIS — C221 Intrahepatic bile duct carcinoma: Secondary | ICD-10-CM | POA: Diagnosis not present

## 2019-09-07 DIAGNOSIS — R200 Anesthesia of skin: Secondary | ICD-10-CM | POA: Diagnosis not present

## 2019-09-07 DIAGNOSIS — R112 Nausea with vomiting, unspecified: Secondary | ICD-10-CM | POA: Insufficient documentation

## 2019-09-07 DIAGNOSIS — F17210 Nicotine dependence, cigarettes, uncomplicated: Secondary | ICD-10-CM | POA: Diagnosis not present

## 2019-09-07 DIAGNOSIS — Z5111 Encounter for antineoplastic chemotherapy: Secondary | ICD-10-CM | POA: Diagnosis present

## 2019-09-07 DIAGNOSIS — Z79899 Other long term (current) drug therapy: Secondary | ICD-10-CM | POA: Diagnosis not present

## 2019-09-07 LAB — COMPREHENSIVE METABOLIC PANEL
ALT: 8 U/L (ref 0–44)
AST: 14 U/L — ABNORMAL LOW (ref 15–41)
Albumin: 3.8 g/dL (ref 3.5–5.0)
Alkaline Phosphatase: 97 U/L (ref 38–126)
Anion gap: 10 (ref 5–15)
BUN: 12 mg/dL (ref 8–23)
CO2: 26 mmol/L (ref 22–32)
Calcium: 9.3 mg/dL (ref 8.9–10.3)
Chloride: 103 mmol/L (ref 98–111)
Creatinine, Ser: 1.29 mg/dL — ABNORMAL HIGH (ref 0.44–1.00)
GFR calc Af Amer: 51 mL/min — ABNORMAL LOW (ref >60)
GFR calc non Af Amer: 44 mL/min — ABNORMAL LOW (ref >60)
Glucose, Bld: 102 mg/dL — ABNORMAL HIGH (ref 70–99)
Potassium: 3.5 mmol/L (ref 3.5–5.1)
Sodium: 139 mmol/L (ref 135–145)
Total Bilirubin: 0.3 mg/dL (ref 0.3–1.2)
Total Protein: 6.8 g/dL (ref 6.5–8.1)

## 2019-09-07 LAB — CBC WITH DIFFERENTIAL/PLATELET
Abs Immature Granulocytes: 0.02 10*3/uL (ref 0.00–0.07)
Basophils Absolute: 0 10*3/uL (ref 0.0–0.1)
Basophils Relative: 0 %
Eosinophils Absolute: 0.1 10*3/uL (ref 0.0–0.5)
Eosinophils Relative: 3 %
HCT: 25.4 % — ABNORMAL LOW (ref 36.0–46.0)
Hemoglobin: 8.4 g/dL — ABNORMAL LOW (ref 12.0–15.0)
Immature Granulocytes: 0 %
Lymphocytes Relative: 33 %
Lymphs Abs: 1.7 10*3/uL (ref 0.7–4.0)
MCH: 34.7 pg — ABNORMAL HIGH (ref 26.0–34.0)
MCHC: 33.1 g/dL (ref 30.0–36.0)
MCV: 105 fL — ABNORMAL HIGH (ref 80.0–100.0)
Monocytes Absolute: 0.6 10*3/uL (ref 0.1–1.0)
Monocytes Relative: 11 %
Neutro Abs: 2.8 10*3/uL (ref 1.7–7.7)
Neutrophils Relative %: 53 %
Platelets: 110 10*3/uL — ABNORMAL LOW (ref 150–400)
RBC: 2.42 MIL/uL — ABNORMAL LOW (ref 3.87–5.11)
RDW: 15.2 % (ref 11.5–15.5)
WBC: 5.3 10*3/uL (ref 4.0–10.5)
nRBC: 0 % (ref 0.0–0.2)

## 2019-09-07 LAB — MAGNESIUM: Magnesium: 1.6 mg/dL — ABNORMAL LOW (ref 1.7–2.4)

## 2019-09-07 MED ORDER — SODIUM CHLORIDE 0.9% FLUSH
10.0000 mL | INTRAVENOUS | Status: DC | PRN
  Administered 2019-09-07 (×2): 10 mL
  Filled 2019-09-07 (×2): qty 10

## 2019-09-07 MED ORDER — SODIUM CHLORIDE 0.9 % IV SOLN
Freq: Once | INTRAVENOUS | Status: AC
  Administered 2019-09-07: 08:00:00 via INTRAVENOUS

## 2019-09-07 MED ORDER — SODIUM CHLORIDE 0.9 % IV SOLN
20.0000 mg/m2 | Freq: Once | INTRAVENOUS | Status: AC
  Administered 2019-09-07: 42 mg via INTRAVENOUS
  Filled 2019-09-07: qty 42

## 2019-09-07 MED ORDER — PALONOSETRON HCL INJECTION 0.25 MG/5ML
0.2500 mg | Freq: Once | INTRAVENOUS | Status: AC
  Administered 2019-09-07: 0.25 mg via INTRAVENOUS
  Filled 2019-09-07: qty 5

## 2019-09-07 MED ORDER — HEPARIN SOD (PORK) LOCK FLUSH 100 UNIT/ML IV SOLN
500.0000 [IU] | Freq: Once | INTRAVENOUS | Status: AC | PRN
  Administered 2019-09-07: 13:00:00 500 [IU]

## 2019-09-07 MED ORDER — SODIUM CHLORIDE 0.9 % IV SOLN
Freq: Once | INTRAVENOUS | Status: AC
  Administered 2019-09-07: 11:00:00 via INTRAVENOUS
  Filled 2019-09-07: qty 5

## 2019-09-07 MED ORDER — SODIUM CHLORIDE 0.9 % IV SOLN
2000.0000 mg | Freq: Once | INTRAVENOUS | Status: AC
  Administered 2019-09-07: 2000 mg via INTRAVENOUS
  Filled 2019-09-07: qty 52.6

## 2019-09-07 MED ORDER — SODIUM CHLORIDE 0.9 % IV SOLN
Freq: Once | INTRAVENOUS | Status: AC
  Administered 2019-09-07: 09:00:00 via INTRAVENOUS
  Filled 2019-09-07: qty 1000

## 2019-09-07 NOTE — Patient Instructions (Signed)
[facility] Discharge Instructions for Patients Receiving Chemotherapy  Today you received the following chemotherapy agents   To help prevent nausea and vomiting after your treatment, we encourage you to take your nausea medication   If you develop nausea and vomiting that is not controlled by your nausea medication, call the clinic.   BELOW ARE SYMPTOMS THAT SHOULD BE REPORTED IMMEDIATELY:  *FEVER GREATER THAN 100.5 F  *CHILLS WITH OR WITHOUT FEVER  NAUSEA AND VOMITING THAT IS NOT CONTROLLED WITH YOUR NAUSEA MEDICATION  *UNUSUAL SHORTNESS OF BREATH  *UNUSUAL BRUISING OR BLEEDING  TENDERNESS IN MOUTH AND THROAT WITH OR WITHOUT PRESENCE OF ULCERS  *URINARY PROBLEMS  *BOWEL PROBLEMS  UNUSUAL RASH Items with * indicate a potential emergency and should be followed up as soon as possible.  Feel free to call the clinic should you have any questions or concerns. The clinic phone number is [phone].  Please show the CHEMO ALERT CARD at check-in to the Emergency Department and triage nurse.   

## 2019-09-07 NOTE — Patient Instructions (Addendum)
[facility] at [facility] Discharge Instructions  You were seen today by Dr. Katragadda. He went over your recent lab results. He will see you back in 1 week for labs and follow up.   Thank you for choosing [facility] at [facility] to provide your oncology and hematology care.  To afford each patient quality time with our provider, please arrive at least 15 minutes before your scheduled appointment time.   If you have a lab appointment with the Cancer Center please come in thru the  Main Entrance and check in at the main information desk  You need to re-schedule your appointment should you arrive 10 or more minutes late.  We strive to give you quality time with our providers, and arriving late affects you and other patients whose appointments are after yours.  Also, if you no show three or more times for appointments you may be dismissed from the clinic at the providers discretion.     Again, thank you for choosing [facility].  Our hope is that these requests will decrease the amount of time that you wait before being seen by our physicians.       _____________________________________________________________  Should you have questions after your visit to [facility], please contact our office at [phone] between the hours of 8:00 a.m. and 4:30 p.m.  Voicemails left after 4:00 p.m. will not be returned until the following business day.  For prescription refill requests, have your pharmacy contact our office and allow 72 hours.    Cancer Center Support Programs:   > Cancer Support Group  2nd Tuesday of the month 1pm-2pm, Journey Room    

## 2019-09-07 NOTE — Progress Notes (Signed)
Nicole Ibarra, [place] [address]   CLINIC:  Medical Oncology/Hematology  PCP:  Cory Munch, PA-C [place] [id number] [phone]   REASON FOR VISIT: Follow-up for nausea and vomiting  CURRENT THERAPY: Cisplatin and gemcitabine.  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 ([place])   02/22/2019 -  Chemotherapy   The patient had palonosetron (ALOXI) injection 0.25 mg, 0.25 mg, Intravenous,  Once, 9 of 9 cycles Administration: 0.25 mg (02/22/2019), 0.25 mg (03/01/2019), 0.25 mg (03/15/2019), 0.25 mg (03/22/2019), 0.25 mg (04/06/2019), 0.25 mg (04/13/2019), 0.25 mg (04/27/2019), 0.25 mg (05/04/2019), 0.25 mg (05/18/2019), 0.25 mg (05/25/2019), 0.25 mg (06/16/2019), 0.25 mg (06/22/2019), 0.25 mg (07/13/2019), 0.25 mg (07/20/2019), 0.25 mg (08/10/2019), 0.25 mg (08/24/2019), 0.25 mg (09/07/2019) pegfilgrastim-cbqv (UDENYCA) injection 6 mg, 6 mg, Subcutaneous, Once, 9 of 9 cycles Administration: 6 mg (03/02/2019), 6 mg (03/24/2019), 6 mg (05/05/2019), 6 mg (05/26/2019), 6 mg (06/23/2019), 6 mg (07/21/2019), 6 mg (08/25/2019) CISplatin (PLATINOL) 52 mg in sodium chloride 0.9 % 250 mL chemo infusion, 25 mg/m2 = 52 mg, Intravenous,  Once, 9 of 9 cycles Dose modification: 20 mg/m2 (80 % of original dose 25 mg/m2, Cycle 9, Reason: Other (see comments), Comment: renal insufficiency) Administration: 52 mg (02/22/2019), 52 mg (03/01/2019), 52 mg (03/15/2019), 52 mg (03/22/2019), 52 mg (04/06/2019), 52 mg (04/13/2019), 52 mg (04/27/2019), 52 mg (05/04/2019), 52 mg (05/18/2019), 52 mg (05/25/2019), 52 mg (06/16/2019), 52 mg (06/22/2019), 52 mg (07/13/2019), 52 mg (07/20/2019), 52 mg (08/10/2019), 52 mg (08/24/2019), 42 mg (09/07/2019) gemcitabine (GEMZAR) 2,000 mg in sodium chloride 0.9 % 250 mL chemo infusion, 2,090 mg, Intravenous,  Once, 9 of 9 cycles Dose modification:  750 mg/m2 (75 % of original dose 1,000 mg/m2, Cycle 4, Reason: Other (see comments), Comment: neutropenia), 750 mg/m2 (75 % of original dose 1,000 mg/m2, Cycle 7, Reason: Other (see comments), Comment: neutripenia) Administration: 2,000 mg (02/22/2019), 2,000 mg (03/01/2019), 2,000 mg (03/15/2019), 2,000 mg (03/22/2019), 2,000 mg (04/06/2019), 2,000 mg (04/13/2019), 2,000 mg (04/27/2019), 1,558 mg (05/04/2019), 2,000 mg (05/18/2019), 2,000 mg (05/25/2019), 2,000 mg (06/16/2019), 2,000 mg (06/22/2019), 2,000 mg (07/13/2019), 1,558 mg (07/20/2019), 2,000 mg (08/10/2019), 2,000 mg (08/24/2019), 2,000 mg (09/07/2019) fosaprepitant (EMEND) 150 mg, dexamethasone (DECADRON) 12 mg in sodium chloride 0.9 % 145 mL IVPB, , Intravenous,  Once, 9 of 9 cycles Administration:  (02/22/2019),  (03/01/2019),  (03/15/2019),  (03/22/2019),  (04/06/2019),  (04/13/2019),  (04/27/2019),  (05/04/2019),  (05/18/2019),  (05/25/2019),  (06/16/2019),  (06/22/2019),  (07/13/2019),  (07/20/2019),  (08/10/2019),  (08/24/2019),  (09/07/2019)  for chemotherapy treatment.       INTERVAL HISTORY:  Ms. Nicole Ibarra 63 y.o. female seen for follow-up of metastatic cholangiocarcinoma next cycle of chemotherapy.  Denies any tingling or numbness extremities.  Appetite is 75%.  Energy levels are 50%.  No pain is reported.  Mild constipation is well controlled with stool softeners.  She reports that she has been drinking enough fluids.  Denies any fevers or night sweats.  Denies any nausea, vomiting, diarrhea.  REVIEW OF SYSTEMS:  Review of Systems  Gastrointestinal: Positive for constipation.  All other systems reviewed and are negative.    PAST MEDICAL/SURGICAL HISTORY:  Past Medical History:  Diagnosis Date  . Anxiety   . Arthritis   . Depression   . Hypertension    Past Surgical History:  Procedure Laterality Date  . COLONOSCOPY N/A 10/23/2016  Procedure: COLONOSCOPY;  Surgeon: Danie Binder, MD;  Location: [facility];  Service: Endoscopy;  Laterality: N/A;  11:30 Am   . POLYPECTOMY  10/23/2016   Procedure: POLYPECTOMY;  Surgeon: Danie Binder, MD;  Location: [facility];  Service: Endoscopy;;  sigmoid colon polyp  . PORTACATH PLACEMENT Left 02/16/2019   Procedure: INSERTION PORT-A-CATH (attached catheter in left subclavian);  Surgeon: Virl Cagey, MD;  Location: [facility] ORS;  Service: General;  Laterality: Left;  . RT HIP SURGERY    . TOTAL HIP ARTHROPLASTY Left 04/07/2016   Procedure: LEFT TOTAL HIP ARTHROPLASTY ANTERIOR APPROACH;  Surgeon: Paralee Cancel, MD;  Location: [facility];  Service: Orthopedics;  Laterality: Left;  Unsuccessful for Spinal, went to General     SOCIAL HISTORY:  Social History   Socioeconomic History  . Marital status: Single    Spouse name: Not on file  . Number of children: Not on file  . Years of education: Not on file  . Highest education level: Not on file  Occupational History  . Not on file  Social Needs  . Financial resource strain: Not hard at all  . Food insecurity    Worry: Never true    Inability: Never true  . Transportation needs    Medical: No    Non-medical: No  Tobacco Use  . Smoking status: Current Every Day Smoker    Packs/day: 0.25    Years: 42.00    Pack years: 10.50  . Smokeless tobacco: Never Used  Substance and Sexual Activity  . Alcohol use: No  . Drug use: No  . Sexual activity: Not Currently  Lifestyle  . Physical activity    Days per week: 0 days    Minutes per session: 0 min  . Stress: Only a little  Relationships  . Social Herbalist on phone: Three times a week    Gets together: Three times a week    Attends religious service: More than 4 times per year    Active member of club or organization: Yes    Attends meetings of clubs or organizations: More than 4 times per year    Relationship status: Never married  . Intimate partner violence    Fear of current or ex partner: No    Emotionally abused: No    Physically abused: No    Forced sexual activity: No  Other  Topics Concern  . Not on file  Social History Narrative  . Not on file    FAMILY HISTORY:  Family History  Problem Relation Age of Onset  . Hypertension Mother   . Cancer Father   . Hypertension Brother   . Cancer Brother   . Hypertension Sister   . Cancer Sister     CURRENT MEDICATIONS:  Outpatient Encounter Medications as of 09/07/2019  Medication Sig  . amLODipine (NORVASC) 10 MG tablet Take 1 tablet (10 mg total) by mouth daily.  Marland Kitchen CISPLATIN IV Inject into the vein. Day 1, 8 every 21 days  . doxazosin (CARDURA) 2 MG tablet Take 2 mg by mouth every evening.   Marland Kitchen GEMCITABINE HCL IV Inject into the vein. Day 1, 8 every 21 days  . potassium chloride (KLOR-CON) 10 MEQ tablet   . vitamin B-12 (CYANOCOBALAMIN) 50 MCG tablet Take 50 mcg by mouth daily.  Marland Kitchen lidocaine (XYLOCAINE) 2 % solution Swish and swallow 1 tablespoon four times a day prn sore mouth (Patient not taking: Reported on 09/07/2019)  .  lidocaine-prilocaine (EMLA) cream Apply to skin over port a cath one hour prior to chemotherapy appointment (Patient not taking: Reported on 09/07/2019)  . naproxen sodium (ALEVE) 220 MG tablet Take 220 mg by mouth 2 (two) times daily as needed.   . prochlorperazine (COMPAZINE) 10 MG tablet TAKE 1 TABLET (10 MG TOTAL) BY MOUTH EVERY 6 (SIX) HOURS AS NEEDED (NAUSEA OR VOMITING). (Patient not taking: Reported on 09/07/2019)  . [DISCONTINUED] prochlorperazine (COMPAZINE) 10 MG tablet TAKE 1 TABLET (10 MG TOTAL) BY MOUTH EVERY 6 (SIX) HOURS AS NEEDED (NAUSEA OR VOMITING).   No facility-administered encounter medications on file as of 09/07/2019.     ALLERGIES:  No Known Allergies   PHYSICAL EXAM:  ECOG Performance status: 1  Vitals:   09/07/19 0816  BP: (!) 162/62  Pulse: 68  Resp: 18  Temp: 97.9 F (36.6 C)  SpO2: 100%   Filed Weights   09/07/19 0816  Weight: 200 lb (90.7 kg)    Physical Exam Constitutional:      Appearance: Normal appearance. She is normal weight.   Cardiovascular:     Rate and Rhythm: Normal rate and regular rhythm.     Heart sounds: Normal heart sounds.  Pulmonary:     Effort: Pulmonary effort is normal.     Breath sounds: Normal breath sounds.  Abdominal:     General: Bowel sounds are normal.     Palpations: Abdomen is soft.  Musculoskeletal: Normal range of motion.  Skin:    General: Skin is warm and dry.  Neurological:     Mental Status: She is alert and oriented to person, place, and time. Mental status is at baseline.  Psychiatric:        Mood and Affect: Mood normal.        Behavior: Behavior normal.        Thought Content: Thought content normal.        Judgment: Judgment normal.      LABORATORY DATA:  I have reviewed the labs as listed.  CBC    Component Value Date/Time   WBC 5.3 09/07/2019 0747   RBC 2.42 (L) 09/07/2019 0747   HGB 8.4 (L) 09/07/2019 0747   HCT 25.4 (L) 09/07/2019 0747   PLT 110 (L) 09/07/2019 0747   MCV 105.0 (H) 09/07/2019 0747   MCH 34.7 (H) 09/07/2019 0747   MCHC 33.1 09/07/2019 0747   RDW 15.2 09/07/2019 0747   LYMPHSABS 1.7 09/07/2019 0747   MONOABS 0.6 09/07/2019 0747   EOSABS 0.1 09/07/2019 0747   BASOSABS 0.0 09/07/2019 0747   CMP Latest Ref Rng & Units 09/07/2019 08/24/2019 08/17/2019  Glucose 70 - 99 mg/dL 102(H) 97 100(H)  BUN 8 - 23 mg/dL 12 18 30(H)  Creatinine 0.44 - 1.00 mg/dL 1.29(H) 1.43(H) 1.55(H)  Sodium 135 - 145 mmol/L 139 137 137  Potassium 3.5 - 5.1 mmol/L 3.5 3.4(L) 3.3(L)  Chloride 98 - 111 mmol/L 103 103 102  CO2 22 - 32 mmol/L 26 24 25   Calcium 8.9 - 10.3 mg/dL 9.3 9.3 9.1  Total Protein 6.5 - 8.1 g/dL 6.8 6.9 6.7  Total Bilirubin 0.3 - 1.2 mg/dL 0.3 0.3 0.6  Alkaline Phos 38 - 126 U/L 97 79 77  AST 15 - 41 U/L 14(L) 14(L) 14(L)  ALT 0 - 44 U/L 8 7 11     I have independently reviewed scans and discussed with the patient.   ASSESSMENT & PLAN:   Cholangiocarcinoma metastatic to liver ([place]) 1.  Metastatic cholangiocarcinoma: -Foundation  1 CDX  MS-stable, no other actionable mutations. - 8 cycles of gemcitabine and cisplatin from 02/22/2019 through 08/10/2019. -CT CAP after 3 cycles on 04/24/2019 compared to PET scan from 01/17/2019 did not show any change in index lesions in the liver, and enlarged porta hepatic lymph nodes. -Last CEA was 9.9 on 06/15/2019, down from 44 on 05/25/2019. -CT CAP on 07/31/2019 after cycle 7 showed improving liver metastasis. - She has tolerated cycle 8 very well.  Did not experience any major side effects. -We reviewed her labs.  She will proceed with day 1 cycle 9 today.  She will come back in 1 week for day 8 of treatment. -I plan to repeat CT scans after this treatment.  If she has a very good response, we will consider holding chemotherapy.  2.  Hypokalemia: -She will continue potassium 20 mEq twice daily.  3.  Health maintenance: -Mammogram on 07/31/2019 was BI-RADS Category 1.   Total time spent is 25 minutes with more than 50% of the time spent face-to-face discussing treatment plan, counseling and coordination of care.  Orders placed this encounter:  No orders of the defined types were placed in this encounter.     Derek Jack, MD [place] [phone]

## 2019-09-07 NOTE — Assessment & Plan Note (Signed)
1.  Metastatic cholangiocarcinoma: -Foundation 1 CDX MS-stable, no other actionable mutations. - 8 cycles of gemcitabine and cisplatin from 02/22/2019 through 08/10/2019. -CT CAP after 3 cycles on 04/24/2019 compared to PET scan from 01/17/2019 did not show any change in index lesions in the liver, and enlarged porta hepatic lymph nodes. -Last CEA was 9.9 on 06/15/2019, down from 44 on 05/25/2019. -CT CAP on 07/31/2019 after cycle 7 showed improving liver metastasis. - She has tolerated cycle 8 very well.  Did not experience any major side effects. -We reviewed her labs.  She will proceed with day 1 cycle 9 today.  She will come back in 1 week for day 8 of treatment. -I plan to repeat CT scans after this treatment.  If she has a very good response, we will consider holding chemotherapy.  2.  Hypokalemia: -She will continue potassium 20 mEq twice daily.  3.  Health maintenance: -Mammogram on 07/31/2019 was BI-RADS Category 1.

## 2019-09-07 NOTE — Progress Notes (Signed)
Labs reviewed with MD today. Proceed with treatment per MD.   Treatment given per orders. Patient tolerated it well without problems. Vitals stable and discharged home from clinic ambulatory. Follow up as scheduled.  

## 2019-09-08 ENCOUNTER — Encounter (HOSPITAL_COMMUNITY): Payer: Self-pay

## 2019-09-08 ENCOUNTER — Other Ambulatory Visit: Payer: Self-pay

## 2019-09-08 ENCOUNTER — Inpatient Hospital Stay (HOSPITAL_COMMUNITY): Payer: Medicare Other

## 2019-09-08 VITALS — BP 143/67 | HR 67 | Temp 96.7°F | Resp 18

## 2019-09-08 DIAGNOSIS — C221 Intrahepatic bile duct carcinoma: Secondary | ICD-10-CM

## 2019-09-08 DIAGNOSIS — C787 Secondary malignant neoplasm of liver and intrahepatic bile duct: Secondary | ICD-10-CM

## 2019-09-08 DIAGNOSIS — Z5111 Encounter for antineoplastic chemotherapy: Secondary | ICD-10-CM | POA: Diagnosis not present

## 2019-09-08 LAB — CEA: CEA: 8.7 ng/mL — ABNORMAL HIGH (ref 0.0–4.7)

## 2019-09-08 MED ORDER — HEPARIN SOD (PORK) LOCK FLUSH 100 UNIT/ML IV SOLN
500.0000 [IU] | Freq: Once | INTRAVENOUS | Status: AC
  Administered 2019-09-08: 500 [IU] via INTRAVENOUS

## 2019-09-08 MED ORDER — SODIUM CHLORIDE 0.9 % IV SOLN
Freq: Once | INTRAVENOUS | Status: AC
  Administered 2019-09-08: 10:00:00 via INTRAVENOUS
  Filled 2019-09-08: qty 1000

## 2019-09-08 MED ORDER — SODIUM CHLORIDE 0.9% FLUSH
10.0000 mL | Freq: Once | INTRAVENOUS | Status: AC | PRN
  Administered 2019-09-08: 10 mL

## 2019-09-08 NOTE — Progress Notes (Signed)
Patient tolerated hydration with no complaints voiced.  Port site clean and dry with good blood return noted before and after hydration.  No bruising or swelling noted with port.  Band aid applied.  VSS with discharge and left ambulatory with no s/s of distress noted.   

## 2019-09-14 ENCOUNTER — Inpatient Hospital Stay (HOSPITAL_BASED_OUTPATIENT_CLINIC_OR_DEPARTMENT_OTHER): Payer: Medicare Other | Admitting: Hematology

## 2019-09-14 ENCOUNTER — Encounter (HOSPITAL_COMMUNITY): Payer: Self-pay

## 2019-09-14 ENCOUNTER — Encounter (HOSPITAL_COMMUNITY): Payer: Self-pay | Admitting: Hematology

## 2019-09-14 ENCOUNTER — Inpatient Hospital Stay (HOSPITAL_COMMUNITY): Payer: Medicare Other

## 2019-09-14 ENCOUNTER — Other Ambulatory Visit: Payer: Self-pay

## 2019-09-14 VITALS — BP 156/75 | HR 75 | Temp 96.2°F | Resp 18 | Wt 201.4 lb

## 2019-09-14 DIAGNOSIS — C221 Intrahepatic bile duct carcinoma: Secondary | ICD-10-CM | POA: Diagnosis not present

## 2019-09-14 DIAGNOSIS — Z5111 Encounter for antineoplastic chemotherapy: Secondary | ICD-10-CM | POA: Diagnosis not present

## 2019-09-14 DIAGNOSIS — C787 Secondary malignant neoplasm of liver and intrahepatic bile duct: Secondary | ICD-10-CM

## 2019-09-14 LAB — CBC WITH DIFFERENTIAL/PLATELET
Abs Immature Granulocytes: 0.02 10*3/uL (ref 0.00–0.07)
Basophils Absolute: 0 10*3/uL (ref 0.0–0.1)
Basophils Relative: 0 %
Eosinophils Absolute: 0.1 10*3/uL (ref 0.0–0.5)
Eosinophils Relative: 1 %
HCT: 23.4 % — ABNORMAL LOW (ref 36.0–46.0)
Hemoglobin: 7.7 g/dL — ABNORMAL LOW (ref 12.0–15.0)
Immature Granulocytes: 1 %
Lymphocytes Relative: 42 %
Lymphs Abs: 1.8 10*3/uL (ref 0.7–4.0)
MCH: 34.1 pg — ABNORMAL HIGH (ref 26.0–34.0)
MCHC: 32.9 g/dL (ref 30.0–36.0)
MCV: 103.5 fL — ABNORMAL HIGH (ref 80.0–100.0)
Monocytes Absolute: 0.2 10*3/uL (ref 0.1–1.0)
Monocytes Relative: 4 %
Neutro Abs: 2.3 10*3/uL (ref 1.7–7.7)
Neutrophils Relative %: 52 %
Platelets: 126 10*3/uL — ABNORMAL LOW (ref 150–400)
RBC: 2.26 MIL/uL — ABNORMAL LOW (ref 3.87–5.11)
RDW: 14.5 % (ref 11.5–15.5)
WBC: 4.4 10*3/uL (ref 4.0–10.5)
nRBC: 0 % (ref 0.0–0.2)

## 2019-09-14 LAB — COMPREHENSIVE METABOLIC PANEL
ALT: 12 U/L (ref 0–44)
AST: 14 U/L — ABNORMAL LOW (ref 15–41)
Albumin: 3.7 g/dL (ref 3.5–5.0)
Alkaline Phosphatase: 81 U/L (ref 38–126)
Anion gap: 10 (ref 5–15)
BUN: 22 mg/dL (ref 8–23)
CO2: 25 mmol/L (ref 22–32)
Calcium: 9.1 mg/dL (ref 8.9–10.3)
Chloride: 103 mmol/L (ref 98–111)
Creatinine, Ser: 1.37 mg/dL — ABNORMAL HIGH (ref 0.44–1.00)
GFR calc Af Amer: 47 mL/min — ABNORMAL LOW (ref >60)
GFR calc non Af Amer: 41 mL/min — ABNORMAL LOW (ref >60)
Glucose, Bld: 102 mg/dL — ABNORMAL HIGH (ref 70–99)
Potassium: 3.3 mmol/L — ABNORMAL LOW (ref 3.5–5.1)
Sodium: 138 mmol/L (ref 135–145)
Total Bilirubin: 0.2 mg/dL — ABNORMAL LOW (ref 0.3–1.2)
Total Protein: 6.7 g/dL (ref 6.5–8.1)

## 2019-09-14 LAB — MAGNESIUM: Magnesium: 1.6 mg/dL — ABNORMAL LOW (ref 1.7–2.4)

## 2019-09-14 MED ORDER — SODIUM CHLORIDE 0.9% FLUSH
10.0000 mL | INTRAVENOUS | Status: DC | PRN
  Administered 2019-09-14: 10 mL
  Filled 2019-09-14: qty 10

## 2019-09-14 MED ORDER — HEPARIN SOD (PORK) LOCK FLUSH 100 UNIT/ML IV SOLN
500.0000 [IU] | Freq: Once | INTRAVENOUS | Status: AC | PRN
  Administered 2019-09-14: 500 [IU]

## 2019-09-14 MED ORDER — SODIUM CHLORIDE 0.9 % IV SOLN
Freq: Once | INTRAVENOUS | Status: AC
  Administered 2019-09-14: 08:00:00 via INTRAVENOUS

## 2019-09-14 MED ORDER — SODIUM CHLORIDE 0.9 % IV SOLN
20.0000 mg/m2 | Freq: Once | INTRAVENOUS | Status: AC
  Administered 2019-09-14: 42 mg via INTRAVENOUS
  Filled 2019-09-14: qty 42

## 2019-09-14 MED ORDER — SODIUM CHLORIDE 0.9 % IV SOLN
2000.0000 mg | Freq: Once | INTRAVENOUS | Status: AC
  Administered 2019-09-14: 2000 mg via INTRAVENOUS
  Filled 2019-09-14: qty 52.6

## 2019-09-14 MED ORDER — SODIUM CHLORIDE 0.9 % IV SOLN
Freq: Once | INTRAVENOUS | Status: AC
  Administered 2019-09-14: 10:00:00 via INTRAVENOUS
  Filled 2019-09-14: qty 5

## 2019-09-14 MED ORDER — PALONOSETRON HCL INJECTION 0.25 MG/5ML
0.2500 mg | Freq: Once | INTRAVENOUS | Status: AC
  Administered 2019-09-14: 0.25 mg via INTRAVENOUS
  Filled 2019-09-14: qty 5

## 2019-09-14 MED ORDER — SODIUM CHLORIDE 0.9 % IV SOLN
Freq: Once | INTRAVENOUS | Status: AC
  Administered 2019-09-14: 09:00:00 via INTRAVENOUS
  Filled 2019-09-14: qty 1000

## 2019-09-14 MED ORDER — SODIUM CHLORIDE 0.9 % IV SOLN
Freq: Once | INTRAVENOUS | Status: AC
  Administered 2019-09-14: 12:00:00 via INTRAVENOUS

## 2019-09-14 NOTE — Patient Instructions (Signed)
[facility] Discharge Instructions for Patients Receiving Chemotherapy   Beginning January 23rd 2017 lab work for the [facility] will be done in the  Main lab at [facility] on 1st floor. If you have a lab appointment with the [facility] please come in thru the  Main Entrance and check in at the main information desk   Today you received the following chemotherapy agents Cisplatin and Gemzar. Follow-up as scheduled. Call clinic for any questions or concerns  To help prevent nausea and vomiting after your treatment, we encourage you to take your nausea medication   If you develop nausea and vomiting, or diarrhea that is not controlled by your medication, call the clinic.  The clinic phone number is [phone]. Office hours are Monday-Friday 8:30am-5:00pm.  BELOW ARE SYMPTOMS THAT SHOULD BE REPORTED IMMEDIATELY:  *FEVER GREATER THAN 101.0 F  *CHILLS WITH OR WITHOUT FEVER  NAUSEA AND VOMITING THAT IS NOT CONTROLLED WITH YOUR NAUSEA MEDICATION  *UNUSUAL SHORTNESS OF BREATH  *UNUSUAL BRUISING OR BLEEDING  TENDERNESS IN MOUTH AND THROAT WITH OR WITHOUT PRESENCE OF ULCERS  *URINARY PROBLEMS  *BOWEL PROBLEMS  UNUSUAL RASH Items with * indicate a potential emergency and should be followed up as soon as possible. If you have an emergency after office hours please contact your primary care physician or go to the nearest emergency department.  Please call the clinic during office hours if you have any questions or concerns.   You may also contact the Patient Navigator at [phone] should you have any questions or need assistance in obtaining follow up care.      Resources For Cancer Patients and their Caregivers ? American Cancer Society: Can assist with transportation, wigs, general needs, runs Look Good Feel Better.        873-008-7548 ? Cancer Care: Provides financial assistance, online support groups, medication/co-pay assistance.   1-800-813-HOPE 616 432 2930) ? [place] Assists [place] cancer patients and their families through emotional , educational and financial support.  (604) 743-0864 ? [place] DSS Where to apply for food stamps, Medicaid and utility assistance. (331)458-2026 ? [facility]: Transportation to medical appointments. 905-632-4977 ? Social Security Administration: May apply for disability if have a Stage IV cancer. [phone] ? [employer], Disability and Transit Services: Assists with nutrition, care and transit needs. 3107893742

## 2019-09-14 NOTE — Progress Notes (Signed)
Nicole Ibarra,  [id number]   CLINIC:  Medical Oncology/Hematology  PCP:  Cory Munch, PA-C [place] [address] [phone]   REASON FOR VISIT: Follow-up for nausea and vomiting  CURRENT THERAPY: Cisplatin and gemcitabine.  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[place])   02/22/2019 -  Chemotherapy   The patient had palonosetron (ALOXI) injection 0.25 mg, 0.25 mg, Intravenous,  Once, 9 of 9 cycles Administration: 0.25 mg (02/22/2019), 0.25 mg (03/01/2019), 0.25 mg (03/15/2019), 0.25 mg (03/22/2019), 0.25 mg (04/06/2019), 0.25 mg (04/13/2019), 0.25 mg (04/27/2019), 0.25 mg (05/04/2019), 0.25 mg (05/18/2019), 0.25 mg (05/25/2019), 0.25 mg (06/16/2019), 0.25 mg (06/22/2019), 0.25 mg (07/13/2019), 0.25 mg (07/20/2019), 0.25 mg (08/10/2019), 0.25 mg (08/24/2019), 0.25 mg (09/07/2019), 0.25 mg (09/14/2019) pegfilgrastim-cbqv (UDENYCA) injection 6 mg, 6 mg, Subcutaneous, Once, 9 of 9 cycles Administration: 6 mg (03/02/2019), 6 mg (03/24/2019), 6 mg (05/05/2019), 6 mg (05/26/2019), 6 mg (06/23/2019), 6 mg (07/21/2019), 6 mg (08/25/2019) CISplatin (PLATINOL) 52 mg in sodium chloride 0.9 % 250 mL chemo infusion, 25 mg/m2 = 52 mg, Intravenous,  Once, 9 of 9 cycles Dose modification: 20 mg/m2 (80 % of original dose 25 mg/m2, Cycle 9, Reason: Other (see comments), Comment: renal insufficiency) Administration: 52 mg (02/22/2019), 52 mg (03/01/2019), 52 mg (03/15/2019), 52 mg (03/22/2019), 52 mg (04/06/2019), 52 mg (04/13/2019), 52 mg (04/27/2019), 52 mg (05/04/2019), 52 mg (05/18/2019), 52 mg (05/25/2019), 52 mg (06/16/2019), 52 mg (06/22/2019), 52 mg (07/13/2019), 52 mg (07/20/2019), 52 mg (08/10/2019), 52 mg (08/24/2019), 42 mg (09/07/2019), 42 mg (09/14/2019) gemcitabine (GEMZAR) 2,000 mg in sodium chloride 0.9 % 250 mL chemo infusion, 2,090 mg, Intravenous,   Once, 9 of 9 cycles Dose modification: 750 mg/m2 (75 % of original dose 1,000 mg/m2, Cycle 4, Reason: Other (see comments), Comment: neutropenia), 750 mg/m2 (75 % of original dose 1,000 mg/m2, Cycle 7, Reason: Other (see comments), Comment: neutripenia) Administration: 2,000 mg (02/22/2019), 2,000 mg (03/01/2019), 2,000 mg (03/15/2019), 2,000 mg (03/22/2019), 2,000 mg (04/06/2019), 2,000 mg (04/13/2019), 2,000 mg (04/27/2019), 1,558 mg (05/04/2019), 2,000 mg (05/18/2019), 2,000 mg (05/25/2019), 2,000 mg (06/16/2019), 2,000 mg (06/22/2019), 2,000 mg (07/13/2019), 1,558 mg (07/20/2019), 2,000 mg (08/10/2019), 2,000 mg (08/24/2019), 2,000 mg (09/07/2019), 2,000 mg (09/14/2019) fosaprepitant (EMEND) 150 mg, dexamethasone (DECADRON) 12 mg in sodium chloride 0.9 % 145 mL IVPB, , Intravenous,  Once, 9 of 9 cycles Administration:  (02/22/2019),  (03/01/2019),  (03/15/2019),  (03/22/2019),  (04/06/2019),  (04/13/2019),  (04/27/2019),  (05/04/2019),  (05/18/2019),  (05/25/2019),  (06/16/2019),  (06/22/2019),  (07/13/2019),  (07/20/2019),  (08/10/2019),  (08/24/2019),  (09/07/2019),  (09/14/2019)  for chemotherapy treatment.       INTERVAL HISTORY:  Nicole Ibarra 63 y.o. female seen for follow-up of cholangiocarcinoma.  Cycle 9-day 1 of chemotherapy was on 09/07/2019.  Did not have any problems with nausea vomiting or diarrhea.  She did have some constipation.  Numbness in the hands comes and goes.  Appetite and energy levels are 50%.  Mild fatigue is present.  REVIEW OF SYSTEMS:  Review of Systems  Constitutional: Positive for fatigue.  Gastrointestinal: Positive for constipation.  Neurological: Positive for numbness.  All other systems reviewed and are negative.    PAST MEDICAL/SURGICAL HISTORY:  Past Medical History:  Diagnosis Date  . Anxiety   . Arthritis   . Depression   . Hypertension    Past Surgical History:  Procedure  Laterality Date  . COLONOSCOPY N/A 10/23/2016   Procedure: COLONOSCOPY;  Surgeon: Danie Binder, MD;  Location: [facility]  END[facility];  Service: Endoscopy;  Laterality: N/A;  11:30 Am  . POLYPECTOMY  10/23/2016   Procedure: POLYPECTOMY;  Surgeon: Danie Binder, MD;  Location: [facility]O [facility];  Service: Endoscopy;;  sigmoid colon polyp  . PORTACATH PLACEMENT Left 02/16/2019   Procedure: INSERTION PORT-A-CATH (attached catheter in left subclavian);  Surgeon: Virl Cagey, MD;  Location: [facility] ORS;  Service: General;  Laterality: Left;  . RT HIP SURGERY    . TOTAL HIP ARTHROPLASTY Left 04/07/2016   Procedure: LEFT TOTAL HIP ARTHROPLASTY ANTERIOR APPROACH;  Surgeon: Paralee Cancel, MD;  Location: [facility];  Service: Orthopedics;  Laterality: Left;  Unsuccessful for Spinal, went to General     SOCIAL HISTORY:  Social History   Socioeconomic History  . Marital status: Single    Spouse name: Not on file  . Number of children: Not on file  . Years of education: Not on file  . Highest education level: Not on file  Occupational History  . Not on file  Social Needs  . Financial resource strain: Not hard at all  . Food insecurity    Worry: Never true    Inability: Never true  . Transportation needs    Medical: No    Non-medical: No  Tobacco Use  . Smoking status: Current Every Day Smoker    Packs/day: 0.25    Years: 42.00    Pack years: 10.50  . Smokeless tobacco: Never Used  Substance and Sexual Activity  . Alcohol use: No  . Drug use: No  . Sexual activity: Not Currently  Lifestyle  . Physical activity    Days per week: 0 days    Minutes per session: 0 min  . Stress: Only a little  Relationships  . Social Herbalist on phone: Three times a week    Gets together: Three times a week    Attends religious service: More than 4 times per year    Active member of club or organization: Yes    Attends meetings of clubs or organizations: More than 4 times per year    Relationship status: Never married  . Intimate partner violence    Fear of current or ex partner: No    Emotionally abused: No     Physically abused: No    Forced sexual activity: No  Other Topics Concern  . Not on file  Social History Narrative  . Not on file    FAMILY HISTORY:  Family History  Problem Relation Age of Onset  . Hypertension Mother   . Cancer Father   . Hypertension Brother   . Cancer Brother   . Hypertension Sister   . Cancer Sister     CURRENT MEDICATIONS:  Outpatient Encounter Medications as of 09/14/2019  Medication Sig  . amLODipine (NORVASC) 10 MG tablet Take 1 tablet (10 mg total) by mouth daily.  Marland Kitchen CISPLATIN IV Inject into the vein. Day 1, 8 every 21 days  . doxazosin (CARDURA) 2 MG tablet Take 2 mg by mouth every evening.   Marland Kitchen GEMCITABINE HCL IV Inject into the vein. Day 1, 8 every 21 days  . lidocaine (XYLOCAINE) 2 % solution Swish and swallow 1 tablespoon four times a day prn sore mouth  . lidocaine-prilocaine (EMLA) cream Apply to skin over port a cath one hour prior to chemotherapy appointment  . naproxen sodium (  ALEVE) 220 MG tablet Take 220 mg by mouth 2 (two) times daily as needed.   . potassium chloride (KLOR-CON) 10 MEQ tablet   . prochlorperazine (COMPAZINE) 10 MG tablet TAKE 1 TABLET (10 MG TOTAL) BY MOUTH EVERY 6 (SIX) HOURS AS NEEDED (NAUSEA OR VOMITING).  . vitamin B-12 (CYANOCOBALAMIN) 50 MCG tablet Take 50 mcg by mouth daily.  . [DISCONTINUED] prochlorperazine (COMPAZINE) 10 MG tablet TAKE 1 TABLET (10 MG TOTAL) BY MOUTH EVERY 6 (SIX) HOURS AS NEEDED (NAUSEA OR VOMITING).  . [EXPIRED] 0.9 %  sodium chloride infusion   . [EXPIRED] sodium chloride 0.9 % 1,000 mL with potassium chloride 20 mEq, magnesium sulfate 2 g infusion   . [DISCONTINUED] sodium chloride flush (NS) 0.9 % injection 10 mL    No facility-administered encounter medications on file as of 09/14/2019.     ALLERGIES:  No Known Allergies   PHYSICAL EXAM:  ECOG Performance status: 1  There were no vitals filed for this visit. There were no vitals filed for this visit.  Physical Exam Constitutional:       Appearance: Normal appearance. She is normal weight.  Cardiovascular:     Rate and Rhythm: Normal rate and regular rhythm.     Heart sounds: Normal heart sounds.  Pulmonary:     Effort: Pulmonary effort is normal.     Breath sounds: Normal breath sounds.  Abdominal:     General: Bowel sounds are normal.     Palpations: Abdomen is soft.  Musculoskeletal: Normal range of motion.  Skin:    General: Skin is warm and dry.  Neurological:     Mental Status: She is alert and oriented to person, place, and time. Mental status is at baseline.  Psychiatric:        Mood and Affect: Mood normal.        Behavior: Behavior normal.        Thought Content: Thought content normal.        Judgment: Judgment normal.      LABORATORY DATA:  I have reviewed the labs as listed.  CBC    Component Value Date/Time   WBC 4.4 09/14/2019 0757   RBC 2.26 (L) 09/14/2019 0757   HGB 7.7 (L) 09/14/2019 0757   HCT 23.4 (L) 09/14/2019 0757   PLT 126 (L) 09/14/2019 0757   MCV 103.5 (H) 09/14/2019 0757   MCH 34.1 (H) 09/14/2019 0757   MCHC 32.9 09/14/2019 0757   RDW 14.5 09/14/2019 0757   LYMPHSABS 1.8 09/14/2019 0757   MONOABS 0.2 09/14/2019 0757   EOSABS 0.1 09/14/2019 0757   BASOSABS 0.0 09/14/2019 0757   CMP Latest Ref Rng & Units 09/14/2019 09/07/2019 08/24/2019  Glucose 70 - 99 mg/dL 102(H) 102(H) 97  BUN 8 - 23 mg/dL 22 12 18   Creatinine 0.44 - 1.00 mg/dL 1.37(H) 1.29(H) 1.43(H)  Sodium 135 - 145 mmol/L 138 139 137  Potassium 3.5 - 5.1 mmol/L 3.3(L) 3.5 3.4(L)  Chloride 98 - 111 mmol/L 103 103 103  CO2 22 - 32 mmol/L 25 26 24   Calcium 8.9 - 10.3 mg/dL 9.1 9.3 9.3  Total Protein 6.5 - 8.1 g/dL 6.7 6.8 6.9  Total Bilirubin 0.3 - 1.2 mg/dL 0.2(L) 0.3 0.3  Alkaline Phos 38 - 126 U/L 81 97 79  AST 15 - 41 U/L 14(L) 14(L) 14(L)  ALT 0 - 44 U/L 12 8 7     I have independently reviewed scans and discussed with the patient.   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  Metastatic cholangiocarcinoma: -Foundation 1 CDX MS-stable, no other actionable mutations. - 8 cycles of gemcitabine and cisplatin from 02/22/2019 through 08/10/2019. -CT CAP after 3 cycles on 04/24/2019 compared to PET scan from 01/17/2019 did not show any change in index lesions in the liver, and enlarged porta hepatic lymph nodes. -Last CEA was 9.9 on 06/15/2019, down from 44 on 05/25/2019. -CT CAP on 07/31/2019 after cycle 7 showed improving liver metastasis. - Cycle 9-day 1 was on 09/07/2019.  She tolerated it very well. -Has on and off numbness in the hands and feet.  Appetite is decreased slightly. -We reviewed her labs today.  Creatinine is slightly worse at 1.37.  She will proceed with day 8 of chemotherapy today.  She will come back tomorrow for hydration. -I plan to repeat her CT scans in 3 weeks.  2.  Hypokalemia: -She will continue potassium 20 mEq twice daily.  3.  Health maintenance: -Mammogram on 07/31/2019 was BI-RADS Category 1.   Total time spent is 25 minutes with more than 50% of the time spent face-to-face discussing treatment plan, counseling and coordination of care.  Orders placed this encounter:  No orders of the defined types were placed in this encounter.     Derek Jack, MD [place] [phone]

## 2019-09-14 NOTE — Assessment & Plan Note (Signed)
1.  Metastatic cholangiocarcinoma: -Foundation 1 CDX MS-stable, no other actionable mutations. - 8 cycles of gemcitabine and cisplatin from 02/22/2019 through 08/10/2019. -CT CAP after 3 cycles on 04/24/2019 compared to PET scan from 01/17/2019 did not show any change in index lesions in the liver, and enlarged porta hepatic lymph nodes. -Last CEA was 9.9 on 06/15/2019, down from 44 on 05/25/2019. -CT CAP on 07/31/2019 after cycle 7 showed improving liver metastasis. - Cycle 9-day 1 was on 09/07/2019.  She tolerated it very well. -Has on and off numbness in the hands and feet.  Appetite is decreased slightly. -We reviewed her labs today.  Creatinine is slightly worse at 1.37.  She will proceed with day 8 of chemotherapy today.  She will come back tomorrow for hydration. -I plan to repeat her CT scans in 3 weeks.  2.  Hypokalemia: -She will continue potassium 20 mEq twice daily.  3.  Health maintenance: -Mammogram on 07/31/2019 was BI-RADS Category 1.

## 2019-09-14 NOTE — Patient Instructions (Addendum)
[facility] at [facility] Discharge Instructions  You were seen today by Dr. Katragadda. He went over your recent lab results. He will see you back in 3 weeks for labs, treatment and follow up.   Thank you for choosing [facility] at [facility] to provide your oncology and hematology care.  To afford each patient quality time with our provider, please arrive at least 15 minutes before your scheduled appointment time.   If you have a lab appointment with the Cancer Center please come in thru the  Main Entrance and check in at the main information desk  You need to re-schedule your appointment should you arrive 10 or more minutes late.  We strive to give you quality time with our providers, and arriving late affects you and other patients whose appointments are after yours.  Also, if you no show three or more times for appointments you may be dismissed from the clinic at the providers discretion.     Again, thank you for choosing [facility].  Our hope is that these requests will decrease the amount of time that you wait before being seen by our physicians.       _____________________________________________________________  Should you have questions after your visit to [facility], please contact our office at [phone] between the hours of 8:00 a.m. and 4:30 p.m.  Voicemails left after 4:00 p.m. will not be returned until the following business day.  For prescription refill requests, have your pharmacy contact our office and allow 72 hours.    Cancer Center Support Programs:   > Cancer Support Group  2nd Tuesday of the month 1pm-2pm, Journey Room    

## 2019-09-14 NOTE — Progress Notes (Signed)
0835 Labs reviewed with and pt seen by Dr. Delton Coombes and pt approved for chemo tx today per MD                            [name] tolerated chemo tx well without complaints or incident. Port left accessed,saline locked and flushed for use tomorrow. VSS upon discharge. Pt discharged self ambulatory in satisfactory condition

## 2019-09-15 ENCOUNTER — Inpatient Hospital Stay (HOSPITAL_COMMUNITY): Payer: Medicare Other

## 2019-09-15 ENCOUNTER — Other Ambulatory Visit: Payer: Self-pay

## 2019-09-15 VITALS — BP 140/61 | HR 79 | Temp 97.3°F | Resp 18

## 2019-09-15 DIAGNOSIS — C221 Intrahepatic bile duct carcinoma: Secondary | ICD-10-CM

## 2019-09-15 DIAGNOSIS — Z5111 Encounter for antineoplastic chemotherapy: Secondary | ICD-10-CM | POA: Diagnosis not present

## 2019-09-15 MED ORDER — PEGFILGRASTIM-CBQV 6 MG/0.6ML ~~LOC~~ SOSY
6.0000 mg | PREFILLED_SYRINGE | Freq: Once | SUBCUTANEOUS | Status: AC
  Administered 2019-09-15: 6 mg via SUBCUTANEOUS
  Filled 2019-09-15: qty 0.6

## 2019-09-15 MED ORDER — SODIUM CHLORIDE 0.9 % IV SOLN
Freq: Once | INTRAVENOUS | Status: AC
  Administered 2019-09-15: 11:00:00 via INTRAVENOUS
  Filled 2019-09-15: qty 1000

## 2019-09-15 MED ORDER — SODIUM CHLORIDE 0.9% FLUSH
10.0000 mL | Freq: Once | INTRAVENOUS | Status: AC | PRN
  Administered 2019-09-15: 10 mL

## 2019-09-15 MED ORDER — HEPARIN SOD (PORK) LOCK FLUSH 100 UNIT/ML IV SOLN
500.0000 [IU] | Freq: Once | INTRAVENOUS | Status: AC | PRN
  Administered 2019-09-15: 500 [IU]

## 2019-09-15 NOTE — Patient Instructions (Signed)
[facility] at [facility]  Discharge Instructions:   _______________________________________________________________  Thank you for choosing [facility] at [facility] to provide your oncology and hematology care.  To afford each patient quality time with our providers, please arrive at least 15 minutes before your scheduled appointment.  You need to re-schedule your appointment if you arrive 10 or more minutes late.  We strive to give you quality time with our providers, and arriving late affects you and other patients whose appointments are after yours.  Also, if you no show three or more times for appointments you may be dismissed from the clinic.  Again, thank you for choosing [facility] at [facility]. Our hope is that these requests will allow you access to exceptional care and in a timely manner. _______________________________________________________________  If you have questions after your visit, please contact our office at [phone] between the hours of 8:30 a.m. and 5:00 p.m. Voicemails left after 4:30 p.m. will not be returned until the following business day. _______________________________________________________________  For prescription refill requests, have your pharmacy contact our office. _______________________________________________________________  Recommendations made by the consultant and any test results will be sent to your referring physician. _______________________________________________________________ 

## 2019-09-15 NOTE — Progress Notes (Signed)
Hydration fluids and udenyca injection given per MD. Patient tolerated it well without problems. Vitals stable and discharged home from clinic ambulatory. Follow up as scheduled.

## 2019-09-27 ENCOUNTER — Other Ambulatory Visit: Payer: Self-pay

## 2019-09-28 ENCOUNTER — Encounter (HOSPITAL_COMMUNITY): Payer: Self-pay

## 2019-09-28 ENCOUNTER — Inpatient Hospital Stay (HOSPITAL_COMMUNITY): Payer: Medicare Other

## 2019-09-28 ENCOUNTER — Inpatient Hospital Stay (HOSPITAL_BASED_OUTPATIENT_CLINIC_OR_DEPARTMENT_OTHER): Payer: Medicare Other | Admitting: Hematology

## 2019-09-28 VITALS — BP 162/57 | HR 76 | Temp 97.7°F | Resp 18

## 2019-09-28 VITALS — BP 147/58 | HR 65 | Temp 97.6°F | Resp 18 | Wt 203.4 lb

## 2019-09-28 DIAGNOSIS — C787 Secondary malignant neoplasm of liver and intrahepatic bile duct: Secondary | ICD-10-CM

## 2019-09-28 DIAGNOSIS — C221 Intrahepatic bile duct carcinoma: Secondary | ICD-10-CM

## 2019-09-28 DIAGNOSIS — Z5111 Encounter for antineoplastic chemotherapy: Secondary | ICD-10-CM | POA: Diagnosis not present

## 2019-09-28 LAB — CBC WITH DIFFERENTIAL/PLATELET
Abs Immature Granulocytes: 0.13 10*3/uL — ABNORMAL HIGH (ref 0.00–0.07)
Basophils Absolute: 0 10*3/uL (ref 0.0–0.1)
Basophils Relative: 0 %
Eosinophils Absolute: 0.1 10*3/uL (ref 0.0–0.5)
Eosinophils Relative: 2 %
HCT: 21.5 % — ABNORMAL LOW (ref 36.0–46.0)
Hemoglobin: 7.1 g/dL — ABNORMAL LOW (ref 12.0–15.0)
Immature Granulocytes: 2 %
Lymphocytes Relative: 38 %
Lymphs Abs: 2.6 10*3/uL (ref 0.7–4.0)
MCH: 34.8 pg — ABNORMAL HIGH (ref 26.0–34.0)
MCHC: 33 g/dL (ref 30.0–36.0)
MCV: 105.4 fL — ABNORMAL HIGH (ref 80.0–100.0)
Monocytes Absolute: 1 10*3/uL (ref 0.1–1.0)
Monocytes Relative: 15 %
Neutro Abs: 3.1 10*3/uL (ref 1.7–7.7)
Neutrophils Relative %: 43 %
Platelets: 75 10*3/uL — ABNORMAL LOW (ref 150–400)
RBC: 2.04 MIL/uL — ABNORMAL LOW (ref 3.87–5.11)
RDW: 15.6 % — ABNORMAL HIGH (ref 11.5–15.5)
WBC: 6.9 10*3/uL (ref 4.0–10.5)
nRBC: 0.3 % — ABNORMAL HIGH (ref 0.0–0.2)

## 2019-09-28 LAB — COMPREHENSIVE METABOLIC PANEL
ALT: 8 U/L (ref 0–44)
AST: 15 U/L (ref 15–41)
Albumin: 3.9 g/dL (ref 3.5–5.0)
Alkaline Phosphatase: 95 U/L (ref 38–126)
Anion gap: 11 (ref 5–15)
BUN: 13 mg/dL (ref 8–23)
CO2: 23 mmol/L (ref 22–32)
Calcium: 9 mg/dL (ref 8.9–10.3)
Chloride: 103 mmol/L (ref 98–111)
Creatinine, Ser: 1.51 mg/dL — ABNORMAL HIGH (ref 0.44–1.00)
GFR calc Af Amer: 42 mL/min — ABNORMAL LOW (ref >60)
GFR calc non Af Amer: 36 mL/min — ABNORMAL LOW (ref >60)
Glucose, Bld: 101 mg/dL — ABNORMAL HIGH (ref 70–99)
Potassium: 3.4 mmol/L — ABNORMAL LOW (ref 3.5–5.1)
Sodium: 137 mmol/L (ref 135–145)
Total Bilirubin: 0.5 mg/dL (ref 0.3–1.2)
Total Protein: 6.7 g/dL (ref 6.5–8.1)

## 2019-09-28 MED ORDER — SODIUM CHLORIDE 0.9% FLUSH
10.0000 mL | INTRAVENOUS | Status: DC | PRN
  Administered 2019-09-28: 10 mL
  Filled 2019-09-28: qty 10

## 2019-09-28 MED ORDER — HEPARIN SOD (PORK) LOCK FLUSH 100 UNIT/ML IV SOLN
500.0000 [IU] | Freq: Once | INTRAVENOUS | Status: AC | PRN
  Administered 2019-09-28: 500 [IU]

## 2019-09-28 MED ORDER — SODIUM CHLORIDE 0.9 % IV SOLN: Freq: Once | INTRAVENOUS | Status: DC

## 2019-09-28 MED ORDER — SODIUM CHLORIDE 0.9 % IV SOLN
Freq: Once | INTRAVENOUS | Status: AC
  Administered 2019-09-28: 09:00:00 via INTRAVENOUS
  Filled 2019-09-28: qty 1000

## 2019-09-28 NOTE — Progress Notes (Signed)
Nicole Ibarra, [place] [address]   CLINIC:  Medical Oncology/Hematology  PCP:  Cory Munch, PA-C [place] [address] [phone]   REASON FOR VISIT: Follow-up for nausea and vomiting  CURRENT THERAPY: Cisplatin and gemcitabine.  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25 mg (09/07/2019), 0.25 mg (09/14/2019) pegfilgrastim-cbqv (UDENYCA) injection 6 mg, 6 mg, Subcutaneous, Once, 10 of 10 cycles Administration: 6 mg (03/02/2019), 6 mg (03/24/2019), 6 mg (05/05/2019), 6 mg (05/26/2019), 6 mg (06/23/2019), 6 mg (07/21/2019), 6 mg (08/25/2019), 6 mg (09/15/2019) CISplatin (PLATINOL) 52 mg in sodium chloride 0.9 % 250 mL chemo infusion, 25 mg/m2 = 52 mg, Intravenous,  Once, 10 of 10 cycles Dose modification: 20 mg/m2 (80 % of original dose 25 mg/m2, Cycle 9, Reason: Other (see comments), Comment: renal insufficiency) Administration: 52 mg (02/22/2019), 52 mg (03/01/2019), 52 mg (03/15/2019), 52 mg (03/22/2019), 52 mg (04/06/2019), 52 mg (04/13/2019), 52 mg (04/27/2019), 52 mg (05/04/2019), 52 mg (05/18/2019), 52 mg (05/25/2019), 52 mg (06/16/2019), 52 mg (06/22/2019), 52 mg (07/13/2019), 52 mg (07/20/2019), 52 mg (08/10/2019), 52 mg (08/24/2019), 42 mg (09/07/2019), 42 mg (09/14/2019) gemcitabine (GEMZAR) 2,000 mg in sodium chloride 0.9 % 250 mL chemo infusion,  2,090 mg, Intravenous,  Once, 10 of 10 cycles Dose modification: 750 mg/m2 (75 % of original dose 1,000 mg/m2, Cycle 4, Reason: Other (see comments), Comment: neutropenia), 750 mg/m2 (75 % of original dose 1,000 mg/m2, Cycle 7, Reason: Other (see comments), Comment: neutripenia) Administration: 2,000 mg (02/22/2019), 2,000 mg (03/01/2019), 2,000 mg (03/15/2019), 2,000 mg (03/22/2019), 2,000 mg (04/06/2019), 2,000 mg (04/13/2019), 2,000 mg (04/27/2019), 1,558 mg (05/04/2019), 2,000 mg (05/18/2019), 2,000 mg (05/25/2019), 2,000 mg (06/16/2019), 2,000 mg (06/22/2019), 2,000 mg (07/13/2019), 1,558 mg (07/20/2019), 2,000 mg (08/10/2019), 2,000 mg (08/24/2019), 2,000 mg (09/07/2019), 2,000 mg (09/14/2019) fosaprepitant (EMEND) 150 mg, dexamethasone (DECADRON) 12 mg in sodium chloride 0.9 % 145 mL IVPB, , Intravenous,  Once, 10 of 10 cycles Administration:  (02/22/2019),  (03/01/2019),  (03/15/2019),  (03/22/2019),  (04/06/2019),  (04/13/2019),  (04/27/2019),  (05/04/2019),  (05/18/2019),  (05/25/2019),  (06/16/2019),  (06/22/2019),  (07/13/2019),  (07/20/2019),  (08/10/2019),  (08/24/2019),  (09/07/2019),  (09/14/2019)  for chemotherapy treatment.       INTERVAL HISTORY:  Nicole Ibarra 63 y.o. female seen for follow-up of cholangiocarcinoma.  Cycle 9-day 8 chemotherapy was on 09/14/2019.  Appetite and energy levels are 50%.  Denies any tingling or numbness in the extremities.  Mild fatigue reported.  No ringing in the ears.  Weight has been stable.  No pains reported.  No fevers or night sweats.  REVIEW OF SYSTEMS:  Review of Systems  Constitutional: Positive for fatigue.  All other systems reviewed and are negative.    PAST MEDICAL/SURGICAL HISTORY:  Past Medical History:  Diagnosis Date  . Anxiety   . Arthritis   . Depression   . Hypertension    Past Surgical History:  Procedure Laterality Date  .  COLONOSCOPY N/A 10/23/2016   Procedure: COLONOSCOPY;  Surgeon: Danie Binder, MD;  Location: [facility];  Service: Endoscopy;   Laterality: N/A;  11:30 Am  . POLYPECTOMY  10/23/2016   Procedure: POLYPECTOMY;  Surgeon: Danie Binder, MD;  Location: [facility];  Service: Endoscopy;;  sigmoid colon polyp  . PORTACATH PLACEMENT Left 02/16/2019   Procedure: INSERTION PORT-A-CATH (attached catheter in left subclavian);  Surgeon: Virl Cagey, MD;  Location: [facility] ORS;  Service: General;  Laterality: Left;  . RT HIP SURGERY    . TOTAL HIP ARTHROPLASTY Left 04/07/2016   Procedure: LEFT TOTAL HIP ARTHROPLASTY ANTERIOR APPROACH;  Surgeon: Paralee Cancel, MD;  Location: [facility];  Service: Orthopedics;  Laterality: Left;  Unsuccessful for Spinal, went to General     SOCIAL HISTORY:  Social History   Socioeconomic History  . Marital status: Single    Spouse name: Not on file  . Number of children: Not on file  . Years of education: Not on file  . Highest education level: Not on file  Occupational History  . Not on file  Social Needs  . Financial resource strain: Not hard at all  . Food insecurity    Worry: Never true    Inability: Never true  . Transportation needs    Medical: No    Non-medical: No  Tobacco Use  . Smoking status: Current Every Day Smoker    Packs/day: 0.25    Years: 42.00    Pack years: 10.50  . Smokeless tobacco: Never Used  Substance and Sexual Activity  . Alcohol use: No  . Drug use: No  . Sexual activity: Not Currently  Lifestyle  . Physical activity    Days per week: 0 days    Minutes per session: 0 min  . Stress: Only a little  Relationships  . Social Herbalist on phone: Three times a week    Gets together: Three times a week    Attends religious service: More than 4 times per year    Active member of club or organization: Yes    Attends meetings of clubs or organizations: More than 4 times per year    Relationship status: Never married  . Intimate partner violence    Fear of current or ex partner: No    Emotionally abused: No    Physically abused: No    Forced  sexual activity: No  Other Topics Concern  . Not on file  Social History Narrative  . Not on file    FAMILY HISTORY:  Family History  Problem Relation Age of Onset  . Hypertension Mother   . Cancer Father   . Hypertension Brother   . Cancer Brother   . Hypertension Sister   . Cancer Sister     CURRENT MEDICATIONS:  Outpatient Encounter Medications as of 09/28/2019  Medication Sig  . amLODipine (NORVASC) 10 MG tablet Take 1 tablet (10 mg total) by mouth daily.  Marland Kitchen CISPLATIN IV Inject into the vein. Day 1, 8 every 21 days  . doxazosin (CARDURA) 2 MG tablet Take 2 mg by mouth every evening.   Marland Kitchen GEMCITABINE HCL IV Inject into the vein. Day 1, 8 every 21 days  . lidocaine (XYLOCAINE) 2 % solution Swish and swallow 1 tablespoon four times a day prn sore mouth  . lidocaine-prilocaine (EMLA) cream Apply to skin over port a cath one hour prior to chemotherapy appointment  . naproxen sodium (ALEVE) 220 MG tablet  Take 220 mg by mouth 2 (two) times daily as needed.   . potassium chloride (KLOR-CON) 10 MEQ tablet   . prochlorperazine (COMPAZINE) 10 MG tablet TAKE 1 TABLET (10 MG TOTAL) BY MOUTH EVERY 6 (SIX) HOURS AS NEEDED (NAUSEA OR VOMITING).  . vitamin B-12 (CYANOCOBALAMIN) 50 MCG tablet Take 50 mcg by mouth daily.  . [DISCONTINUED] prochlorperazine (COMPAZINE) 10 MG tablet TAKE 1 TABLET (10 MG TOTAL) BY MOUTH EVERY 6 (SIX) HOURS AS NEEDED (NAUSEA OR VOMITING).  . [EXPIRED] heparin lock flush 100 unit/mL   . [EXPIRED] sodium chloride 0.9 % 1,000 mL with potassium chloride 20 mEq, magnesium sulfate 2 g infusion   . [DISCONTINUED] 0.9 %  sodium chloride infusion   . [DISCONTINUED] sodium chloride flush (NS) 0.9 % injection 10 mL    No facility-administered encounter medications on file as of 09/28/2019.     ALLERGIES:  No Known Allergies   PHYSICAL EXAM:  ECOG Performance status: 1  Vitals:   09/28/19 0805  BP: (!) 147/58  Pulse: 65  Resp: 18  Temp: 97.6 F (36.4 C)  SpO2:  100%   Filed Weights   09/28/19 0805  Weight: 203 lb 6.4 oz (92.3 kg)    Physical Exam Constitutional:      Appearance: Normal appearance. She is normal weight.  Cardiovascular:     Rate and Rhythm: Normal rate and regular rhythm.     Heart sounds: Normal heart sounds.  Pulmonary:     Effort: Pulmonary effort is normal.     Breath sounds: Normal breath sounds.  Abdominal:     General: Bowel sounds are normal.     Palpations: Abdomen is soft.  Musculoskeletal: Normal range of motion.  Skin:    General: Skin is warm and dry.  Neurological:     Mental Status: She is alert and oriented to person, place, and time. Mental status is at baseline.  Psychiatric:        Mood and Affect: Mood normal.        Behavior: Behavior normal.        Thought Content: Thought content normal.        Judgment: Judgment normal.      LABORATORY DATA:  I have reviewed the labs as listed.  CBC    Component Value Date/Time   WBC 7.3 10/02/2019 0808   RBC 2.10 (L) 10/02/2019 0808   HGB 7.2 (L) 10/02/2019 0808   HCT 22.4 (L) 10/02/2019 0808   PLT 178 10/02/2019 0808   MCV 106.7 (H) 10/02/2019 0808   MCH 34.3 (H) 10/02/2019 0808   MCHC 32.1 10/02/2019 0808   RDW 16.5 (H) 10/02/2019 0808   LYMPHSABS 2.6 10/02/2019 0808   MONOABS 1.2 (H) 10/02/2019 0808   EOSABS 0.2 10/02/2019 0808   BASOSABS 0.0 10/02/2019 0808   CMP Latest Ref Rng & Units 10/02/2019 09/28/2019 09/14/2019  Glucose 70 - 99 mg/dL 101(H) 101(H) 102(H)  BUN 8 - 23 mg/dL 19 13 22   Creatinine 0.44 - 1.00 mg/dL 1.38(H) 1.51(H) 1.37(H)  Sodium 135 - 145 mmol/L 139 137 138  Potassium 3.5 - 5.1 mmol/L 3.3(L) 3.4(L) 3.3(L)  Chloride 98 - 111 mmol/L 105 103 103  CO2 22 - 32 mmol/L 24 23 25   Calcium 8.9 - 10.3 mg/dL 9.0 9.0 9.1  Total Protein 6.5 - 8.1 g/dL 7.2 6.7 6.7  Total Bilirubin 0.3 - 1.2 mg/dL 0.5 0.5 0.2(L)  Alkaline Phos 38 - 126 U/L 86 95 81  AST 15 - 41 U/L  14(L) 15 14(L)  ALT 0 - 44 U/L 7 8 12     I have independently  reviewed scans and discussed with the patient.   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  Metastatic cholangiocarcinoma: -Foundation 1 CDX MS-stable, no other actionable mutations. -9 cycles of gemcitabine and cisplatin from 02/22/2019 through 09/07/2019. -Last CT scan on 07/31/2019 showed improvement in liver metastasis after cycle 7. -CEA on 09/07/2019 was 8.7. -We reviewed her labs.  Creatinine increased to 1.5.  We will give fluids today.  We will also check her labs next week. -We will see her back in 3 weeks with repeat CT CAP.  We will also consider sending for guardant 364 mutation testing.  2.  Hypokalemia: -She will continue potassium 20 mEq twice daily.  3.  Health maintenance: -Mammogram on 07/31/2019 was BI-RADS Category 1.   Total time spent is 25 minutes with more than 50% of the time spent face-to-face discussing treatment plan, counseling and coordination of care.  Orders placed this encounter:  Orders Placed This Encounter  Procedures  . CT Chest W Contrast  . CT Abdomen Pelvis W Contrast  . CBC with Differential/Platelet  . Comprehensive metabolic panel  . Magnesium  . CEA      Derek Jack, MD [place] [phone]

## 2019-09-28 NOTE — Patient Instructions (Signed)
[facility] at [facility]  Discharge Instructions:   _______________________________________________________________  Thank you for choosing  Cancer Center at [facility] to provide your oncology and hematology care.  To afford each patient quality time with our providers, please arrive at least 15 minutes before your scheduled appointment.  You need to re-schedule your appointment if you arrive 10 or more minutes late.  We strive to give you quality time with our providers, and arriving late affects you and other patients whose appointments are after yours.  Also, if you no show three or more times for appointments you may be dismissed from the clinic.  Again, thank you for choosing  Cancer Center at [facility]. Our hope is that these requests will allow you access to exceptional care and in a timely manner. _______________________________________________________________  If you have questions after your visit, please contact our office at [phone] between the hours of 8:30 a.m. and 5:00 p.m. Voicemails left after 4:30 p.m. will not be returned until the following business day. _______________________________________________________________  For prescription refill requests, have your pharmacy contact our office. _______________________________________________________________  Recommendations made by the consultant and any test results will be sent to your referring physician. _______________________________________________________________ 

## 2019-09-28 NOTE — Progress Notes (Signed)
Pt presents today for f/u visit with Dr. Delton Coombes and tx. MAR reviewed and updated. VS within parameters for tx today. Labs pending. Pt states she has no changes since the last tx. Her fatigue and appetite are the same. No complaints of pain today.   Dr. Delton Coombes at the bedside. VO received to hold tx today. Pt to receive IV hydration only today. 1 Liter over 2 hours. Pt to return in 3 weeks for scans and lab work per Dr. Delton Coombes.   Labs reported via instant message. HGB 7.1 and platelets 75. Asymptomatic. Per ATravis LPN. Pt to return next week for lab work and possible blood products. Tx HELD today per VO Dr. Delton Coombes.   No complaints at this time. Discharged from clinic ambulatory. F/U with [facility] as scheduled.

## 2019-09-28 NOTE — Patient Instructions (Addendum)
[place] at [facility] Discharge Instructions  You were seen today by Dr. Delton Coombes. He went over your recent lab results. He will repeat scans prior to your next visit. He will see you back in 1 week for labs and follow up.   Thank you for choosing [place] at [facility] to provide your oncology and hematology care.  To afford each patient quality time with our provider, please arrive at least 15 minutes before your scheduled appointment time.   If you have a lab appointment with the [facility] please come in thru the  Main Entrance and check in at the main information desk  You need to re-schedule your appointment should you arrive 10 or more minutes late.  We strive to give you quality time with our providers, and arriving late affects you and other patients whose appointments are after yours.  Also, if you no show three or more times for appointments you may be dismissed from the clinic at the providers discretion.     Again, thank you for choosing [facility].  Our hope is that these requests will decrease the amount of time that you wait before being seen by our physicians.       _____________________________________________________________  Should you have questions after your visit to [facility], please contact our office at [phone] between the hours of 8:00 a.m. and 4:30 p.m.  Voicemails left after 4:00 p.m. will not be returned until the following business day.  For prescription refill requests, have your pharmacy contact our office and allow 72 hours.    Cancer Center Support Programs:   > Cancer Support Group  2nd Tuesday of the month 1pm-2pm, Journey Room

## 2019-09-29 ENCOUNTER — Ambulatory Visit (HOSPITAL_COMMUNITY): Payer: Medicare Other

## 2019-10-02 ENCOUNTER — Other Ambulatory Visit (HOSPITAL_COMMUNITY): Payer: Medicare Other

## 2019-10-02 ENCOUNTER — Inpatient Hospital Stay (HOSPITAL_COMMUNITY): Payer: Medicare Other

## 2019-10-02 ENCOUNTER — Other Ambulatory Visit: Payer: Self-pay

## 2019-10-02 DIAGNOSIS — D6481 Anemia due to antineoplastic chemotherapy: Secondary | ICD-10-CM

## 2019-10-02 DIAGNOSIS — C221 Intrahepatic bile duct carcinoma: Secondary | ICD-10-CM

## 2019-10-02 DIAGNOSIS — C787 Secondary malignant neoplasm of liver and intrahepatic bile duct: Secondary | ICD-10-CM

## 2019-10-02 DIAGNOSIS — Z5111 Encounter for antineoplastic chemotherapy: Secondary | ICD-10-CM | POA: Diagnosis not present

## 2019-10-02 DIAGNOSIS — T451X5A Adverse effect of antineoplastic and immunosuppressive drugs, initial encounter: Secondary | ICD-10-CM

## 2019-10-02 LAB — CBC WITH DIFFERENTIAL/PLATELET
Abs Immature Granulocytes: 0.11 10*3/uL — ABNORMAL HIGH (ref 0.00–0.07)
Basophils Absolute: 0 10*3/uL (ref 0.0–0.1)
Basophils Relative: 0 %
Eosinophils Absolute: 0.2 10*3/uL (ref 0.0–0.5)
Eosinophils Relative: 3 %
HCT: 22.4 % — ABNORMAL LOW (ref 36.0–46.0)
Hemoglobin: 7.2 g/dL — ABNORMAL LOW (ref 12.0–15.0)
Immature Granulocytes: 2 %
Lymphocytes Relative: 35 %
Lymphs Abs: 2.6 10*3/uL (ref 0.7–4.0)
MCH: 34.3 pg — ABNORMAL HIGH (ref 26.0–34.0)
MCHC: 32.1 g/dL (ref 30.0–36.0)
MCV: 106.7 fL — ABNORMAL HIGH (ref 80.0–100.0)
Monocytes Absolute: 1.2 10*3/uL — ABNORMAL HIGH (ref 0.1–1.0)
Monocytes Relative: 16 %
Neutro Abs: 3.3 10*3/uL (ref 1.7–7.7)
Neutrophils Relative %: 44 %
Platelets: 178 10*3/uL (ref 150–400)
RBC: 2.1 MIL/uL — ABNORMAL LOW (ref 3.87–5.11)
RDW: 16.5 % — ABNORMAL HIGH (ref 11.5–15.5)
WBC: 7.3 10*3/uL (ref 4.0–10.5)
nRBC: 0 % (ref 0.0–0.2)

## 2019-10-02 LAB — COMPREHENSIVE METABOLIC PANEL
ALT: 7 U/L (ref 0–44)
AST: 14 U/L — ABNORMAL LOW (ref 15–41)
Albumin: 3.9 g/dL (ref 3.5–5.0)
Alkaline Phosphatase: 86 U/L (ref 38–126)
Anion gap: 10 (ref 5–15)
BUN: 19 mg/dL (ref 8–23)
CO2: 24 mmol/L (ref 22–32)
Calcium: 9 mg/dL (ref 8.9–10.3)
Chloride: 105 mmol/L (ref 98–111)
Creatinine, Ser: 1.38 mg/dL — ABNORMAL HIGH (ref 0.44–1.00)
GFR calc Af Amer: 47 mL/min — ABNORMAL LOW (ref >60)
GFR calc non Af Amer: 40 mL/min — ABNORMAL LOW (ref >60)
Glucose, Bld: 101 mg/dL — ABNORMAL HIGH (ref 70–99)
Potassium: 3.3 mmol/L — ABNORMAL LOW (ref 3.5–5.1)
Sodium: 139 mmol/L (ref 135–145)
Total Bilirubin: 0.5 mg/dL (ref 0.3–1.2)
Total Protein: 7.2 g/dL (ref 6.5–8.1)

## 2019-10-02 LAB — SAMPLE TO BLOOD BANK

## 2019-10-02 MED ORDER — SODIUM CHLORIDE 0.9% FLUSH
10.0000 mL | Freq: Once | INTRAVENOUS | Status: AC
  Administered 2019-10-02: 10 mL via INTRAVENOUS

## 2019-10-02 MED ORDER — HEPARIN SOD (PORK) LOCK FLUSH 100 UNIT/ML IV SOLN
500.0000 [IU] | Freq: Once | INTRAVENOUS | Status: AC
  Administered 2019-10-02: 500 [IU] via INTRAVENOUS

## 2019-10-02 NOTE — Patient Instructions (Signed)
[facility] at [facility] Discharge Instructions  Labs drawn from portacath today   Thank you for choosing [facility] at [facility] to provide your oncology and hematology care.  To afford each patient quality time with our provider, please arrive at least 15 minutes before your scheduled appointment time.   If you have a lab appointment with the Cancer Center please come in thru the Main Entrance and check in at the main information desk.  You need to re-schedule your appointment should you arrive 10 or more minutes late.  We strive to give you quality time with our providers, and arriving late affects you and other patients whose appointments are after yours.  Also, if you no show three or more times for appointments you may be dismissed from the clinic at the providers discretion.     Again, thank you for choosing [facility].  Our hope is that these requests will decrease the amount of time that you wait before being seen by our physicians.       _____________________________________________________________  Should you have questions after your visit to  Cancer Center, please contact our office at [phone] between the hours of 8:00 a.m. and 4:30 p.m.  Voicemails left after 4:00 p.m. will not be returned until the following business day.  For prescription refill requests, have your pharmacy contact our office and allow 72 hours.    Due to Covid, you will need to wear a mask upon entering the hospital. If you do not have a mask, a mask will be given to you at the Main Entrance upon arrival. For doctor visits, patients may have 1 support person with them. For treatment visits, patients can not have anyone with them due to social distancing guidelines and our immunocompromised population.     

## 2019-10-02 NOTE — Progress Notes (Signed)
Hemoglobin 7.2, creatinine 1.38. MD reviewed labs . Patient is asymptomatic, no complaints of increased SOB, denies any fatigue.   Vitals stable and discharged home from clinic ambulatory. Follow up as scheduled.

## 2019-10-03 LAB — CEA: CEA: 8.6 ng/mL — ABNORMAL HIGH (ref 0.0–4.7)

## 2019-10-06 ENCOUNTER — Encounter (HOSPITAL_COMMUNITY): Payer: Self-pay | Admitting: Hematology

## 2019-10-06 NOTE — Assessment & Plan Note (Signed)
1.  Metastatic cholangiocarcinoma: -Foundation 1 CDX MS-stable, no other actionable mutations. -9 cycles of gemcitabine and cisplatin from 02/22/2019 through 09/07/2019. -Last CT scan on 07/31/2019 showed improvement in liver metastasis after cycle 7. -CEA on 09/07/2019 was 8.7. -We reviewed her labs.  Creatinine increased to 1.5.  We will give fluids today.  We will also check her labs next week. -We will see her back in 3 weeks with repeat CT CAP.  We will also consider sending for guardant 364 mutation testing.  2.  Hypokalemia: -She will continue potassium 20 mEq twice daily.  3.  Health maintenance: -Mammogram on 07/31/2019 was BI-RADS Category 1.

## 2019-10-17 ENCOUNTER — Ambulatory Visit (HOSPITAL_COMMUNITY)
Admission: RE | Admit: 2019-10-17 | Discharge: 2019-10-17 | Disposition: A | Discharge status: Home / Self Care | Payer: Medicare Other | Source: Ambulatory Visit | Attending: Hematology | Admitting: Hematology

## 2019-10-17 ENCOUNTER — Other Ambulatory Visit: Payer: Self-pay

## 2019-10-17 DIAGNOSIS — C787 Secondary malignant neoplasm of liver and intrahepatic bile duct: Secondary | ICD-10-CM | POA: Insufficient documentation

## 2019-10-17 DIAGNOSIS — C221 Intrahepatic bile duct carcinoma: Secondary | ICD-10-CM | POA: Insufficient documentation

## 2019-10-17 MED ORDER — IOHEXOL 300 MG/ML  SOLN
100.0000 mL | Freq: Once | INTRAMUSCULAR | Status: AC | PRN
  Administered 2019-10-17: 100 mL via INTRAVENOUS

## 2019-10-19 ENCOUNTER — Inpatient Hospital Stay (HOSPITAL_COMMUNITY): Payer: Medicare Other

## 2019-10-19 ENCOUNTER — Encounter (HOSPITAL_COMMUNITY): Payer: Self-pay

## 2019-10-19 ENCOUNTER — Inpatient Hospital Stay (HOSPITAL_BASED_OUTPATIENT_CLINIC_OR_DEPARTMENT_OTHER): Payer: Medicare Other | Admitting: Hematology

## 2019-10-19 ENCOUNTER — Other Ambulatory Visit: Payer: Self-pay

## 2019-10-19 ENCOUNTER — Encounter (HOSPITAL_COMMUNITY): Payer: Self-pay | Admitting: Hematology

## 2019-10-19 ENCOUNTER — Inpatient Hospital Stay (HOSPITAL_COMMUNITY): Payer: Medicare Other | Attending: Hematology

## 2019-10-19 ENCOUNTER — Encounter: Payer: Self-pay | Admitting: Hematology

## 2019-10-19 VITALS — BP 148/56 | HR 70 | Temp 97.8°F | Resp 18

## 2019-10-19 DIAGNOSIS — K5900 Constipation, unspecified: Secondary | ICD-10-CM | POA: Insufficient documentation

## 2019-10-19 DIAGNOSIS — N83201 Unspecified ovarian cyst, right side: Secondary | ICD-10-CM | POA: Diagnosis not present

## 2019-10-19 DIAGNOSIS — C221 Intrahepatic bile duct carcinoma: Secondary | ICD-10-CM

## 2019-10-19 DIAGNOSIS — F17210 Nicotine dependence, cigarettes, uncomplicated: Secondary | ICD-10-CM | POA: Diagnosis not present

## 2019-10-19 DIAGNOSIS — Z79899 Other long term (current) drug therapy: Secondary | ICD-10-CM | POA: Insufficient documentation

## 2019-10-19 DIAGNOSIS — R5383 Other fatigue: Secondary | ICD-10-CM | POA: Diagnosis not present

## 2019-10-19 DIAGNOSIS — C240 Malignant neoplasm of extrahepatic bile duct: Secondary | ICD-10-CM | POA: Diagnosis present

## 2019-10-19 DIAGNOSIS — C787 Secondary malignant neoplasm of liver and intrahepatic bile duct: Secondary | ICD-10-CM

## 2019-10-19 DIAGNOSIS — E876 Hypokalemia: Secondary | ICD-10-CM | POA: Insufficient documentation

## 2019-10-19 DIAGNOSIS — Z8249 Family history of ischemic heart disease and other diseases of the circulatory system: Secondary | ICD-10-CM | POA: Diagnosis not present

## 2019-10-19 DIAGNOSIS — Z809 Family history of malignant neoplasm, unspecified: Secondary | ICD-10-CM | POA: Insufficient documentation

## 2019-10-19 LAB — COMPREHENSIVE METABOLIC PANEL
ALT: 10 U/L (ref 0–44)
AST: 15 U/L (ref 15–41)
Albumin: 3.8 g/dL (ref 3.5–5.0)
Alkaline Phosphatase: 75 U/L (ref 38–126)
Anion gap: 11 (ref 5–15)
BUN: 23 mg/dL (ref 8–23)
CO2: 25 mmol/L (ref 22–32)
Calcium: 9.3 mg/dL (ref 8.9–10.3)
Chloride: 103 mmol/L (ref 98–111)
Creatinine, Ser: 1.4 mg/dL — ABNORMAL HIGH (ref 0.44–1.00)
GFR calc Af Amer: 46 mL/min — ABNORMAL LOW (ref >60)
GFR calc non Af Amer: 40 mL/min — ABNORMAL LOW (ref >60)
Glucose, Bld: 105 mg/dL — ABNORMAL HIGH (ref 70–99)
Potassium: 3.3 mmol/L — ABNORMAL LOW (ref 3.5–5.1)
Sodium: 139 mmol/L (ref 135–145)
Total Bilirubin: 0.7 mg/dL (ref 0.3–1.2)
Total Protein: 6.8 g/dL (ref 6.5–8.1)

## 2019-10-19 LAB — CBC WITH DIFFERENTIAL/PLATELET
Abs Immature Granulocytes: 0.01 10*3/uL (ref 0.00–0.07)
Basophils Absolute: 0 10*3/uL (ref 0.0–0.1)
Basophils Relative: 1 %
Eosinophils Absolute: 0.2 10*3/uL (ref 0.0–0.5)
Eosinophils Relative: 4 %
HCT: 25.2 % — ABNORMAL LOW (ref 36.0–46.0)
Hemoglobin: 8.1 g/dL — ABNORMAL LOW (ref 12.0–15.0)
Immature Granulocytes: 0 %
Lymphocytes Relative: 37 %
Lymphs Abs: 2 10*3/uL (ref 0.7–4.0)
MCH: 34 pg (ref 26.0–34.0)
MCHC: 32.1 g/dL (ref 30.0–36.0)
MCV: 105.9 fL — ABNORMAL HIGH (ref 80.0–100.0)
Monocytes Absolute: 0.6 10*3/uL (ref 0.1–1.0)
Monocytes Relative: 11 %
Neutro Abs: 2.5 10*3/uL (ref 1.7–7.7)
Neutrophils Relative %: 47 %
Platelets: 171 10*3/uL (ref 150–400)
RBC: 2.38 MIL/uL — ABNORMAL LOW (ref 3.87–5.11)
RDW: 15.4 % (ref 11.5–15.5)
WBC: 5.3 10*3/uL (ref 4.0–10.5)
nRBC: 0 % (ref 0.0–0.2)

## 2019-10-19 LAB — MAGNESIUM: Magnesium: 1.8 mg/dL (ref 1.7–2.4)

## 2019-10-19 MED ORDER — SODIUM CHLORIDE 0.9% FLUSH
20.0000 mL | INTRAVENOUS | Status: DC | PRN
  Administered 2019-10-19: 20 mL via INTRAVENOUS
  Filled 2019-10-19: qty 20

## 2019-10-19 MED ORDER — SODIUM CHLORIDE 0.9 % IV SOLN
Freq: Once | INTRAVENOUS | Status: AC
  Administered 2019-10-19: 09:00:00 via INTRAVENOUS
  Filled 2019-10-19: qty 1000

## 2019-10-19 MED ORDER — HEPARIN SOD (PORK) LOCK FLUSH 100 UNIT/ML IV SOLN
500.0000 [IU] | Freq: Once | INTRAVENOUS | Status: AC
  Administered 2019-10-19: 500 [IU] via INTRAVENOUS

## 2019-10-19 MED ORDER — SODIUM CHLORIDE 0.9 % IV SOLN: Freq: Once | INTRAVENOUS | Status: DC

## 2019-10-19 NOTE — Patient Instructions (Signed)
[place] at [facility] Discharge Instructions  No chemo tx today. Received IV hydration with magnesium and potassium today. Follow-up as scheduled. Call clinic for any questions or concerns   Thank you for choosing [place] at [facility] to provide your oncology and hematology care.  To afford each patient quality time with our provider, please arrive at least 15 minutes before your scheduled appointment time.   If you have a lab appointment with the [facility] please come in thru the Main Entrance and check in at the main information desk.  You need to re-schedule your appointment should you arrive 10 or more minutes late.  We strive to give you quality time with our providers, and arriving late affects you and other patients whose appointments are after yours.  Also, if you no show three or more times for appointments you may be dismissed from the clinic at the providers discretion.     Again, thank you for choosing [facility].  Our hope is that these requests will decrease the amount of time that you wait before being seen by our physicians.       _____________________________________________________________  Should you have questions after your visit to [facility], please contact our office at [phone] between the hours of 8:00 a.m. and 4:30 p.m.  Voicemails left after 4:00 p.m. will not be returned until the following business day.  For prescription refill requests, have your pharmacy contact our office and allow 72 hours.    Due to Covid, you will need to wear a mask upon entering the hospital. If you do not have a mask, a mask will be given to you at the Main Entrance upon arrival. For doctor visits, patients may have 1 support person with them. For treatment visits, patients can not have anyone with them due to social distancing guidelines and our immunocompromised population.

## 2019-10-19 NOTE — Progress Notes (Signed)
[place], [place] [address]   CLINIC:  Medical Oncology/Hematology  PCP:  Cory Munch, PA-C [place] [address] [phone]   REASON FOR VISIT: Follow-up for nausea and vomiting  CURRENT THERAPY: Cisplatin and gemcitabine.  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[place])   02/22/2019 -  Chemotherapy   The patient had palonosetron (ALOXI) injection 0.25 mg, 0.25 mg, Intravenous,  Once, 10 of 10 cycles Administration: 0.25 mg (02/22/2019), 0.25 mg (03/01/2019), 0.25 mg (03/15/2019), 0.25 mg (03/22/2019), 0.25 mg (04/06/2019), 0.25 mg (04/13/2019), 0.25 mg (04/27/2019), 0.25 mg (05/04/2019), 0.25 mg (05/18/2019), 0.25 mg (05/25/2019), 0.25 mg (06/16/2019), 0.25 mg (06/22/2019), 0.25 mg (07/13/2019), 0.25 mg (07/20/2019), 0.25 mg (08/10/2019), 0.25 mg (08/24/2019), 0.25 mg (09/07/2019), 0.25 mg (09/14/2019) pegfilgrastim-cbqv (UDENYCA) injection 6 mg, 6 mg, Subcutaneous, Once, 10 of 10 cycles Administration: 6 mg (03/02/2019), 6 mg (03/24/2019), 6 mg (05/05/2019), 6 mg (05/26/2019), 6 mg (06/23/2019), 6 mg (07/21/2019), 6 mg (08/25/2019), 6 mg (09/15/2019) CISplatin (PLATINOL) 52 mg in sodium chloride 0.9 % 250 mL chemo infusion, 25 mg/m2 = 52 mg, Intravenous,  Once, 10 of 10 cycles Dose modification: 20 mg/m2 (80 % of original dose 25 mg/m2, Cycle 9, Reason: Other (see comments), Comment: renal insufficiency) Administration: 52 mg (02/22/2019), 52 mg (03/01/2019), 52 mg (03/15/2019), 52 mg (03/22/2019), 52 mg (04/06/2019), 52 mg (04/13/2019), 52 mg (04/27/2019), 52 mg (05/04/2019), 52 mg (05/18/2019), 52 mg (05/25/2019), 52 mg (06/16/2019), 52 mg (06/22/2019), 52 mg (07/13/2019), 52 mg (07/20/2019), 52 mg (08/10/2019), 52 mg (08/24/2019), 42 mg (09/07/2019), 42 mg (09/14/2019) gemcitabine (GEMZAR) 2,000 mg in sodium chloride 0.9 % 250 mL chemo infusion,  2,090 mg, Intravenous,  Once, 10 of 10 cycles Dose modification: 750 mg/m2 (75 % of original dose 1,000 mg/m2, Cycle 4, Reason: Other (see comments), Comment: neutropenia), 750 mg/m2 (75 % of original dose 1,000 mg/m2, Cycle 7, Reason: Other (see comments), Comment: neutripenia) Administration: 2,000 mg (02/22/2019), 2,000 mg (03/01/2019), 2,000 mg (03/15/2019), 2,000 mg (03/22/2019), 2,000 mg (04/06/2019), 2,000 mg (04/13/2019), 2,000 mg (04/27/2019), 1,558 mg (05/04/2019), 2,000 mg (05/18/2019), 2,000 mg (05/25/2019), 2,000 mg (06/16/2019), 2,000 mg (06/22/2019), 2,000 mg (07/13/2019), 1,558 mg (07/20/2019), 2,000 mg (08/10/2019), 2,000 mg (08/24/2019), 2,000 mg (09/07/2019), 2,000 mg (09/14/2019) fosaprepitant (EMEND) 150 mg, dexamethasone (DECADRON) 12 mg in sodium chloride 0.9 % 145 mL IVPB, , Intravenous,  Once, 10 of 10 cycles Administration:  (02/22/2019),  (03/01/2019),  (03/15/2019),  (03/22/2019),  (04/06/2019),  (04/13/2019),  (04/27/2019),  (05/04/2019),  (05/18/2019),  (05/25/2019),  (06/16/2019),  (06/22/2019),  (07/13/2019),  (07/20/2019),  (08/10/2019),  (08/24/2019),  (09/07/2019),  (09/14/2019)  for chemotherapy treatment.       INTERVAL HISTORY:  Nicole Ibarra 63 y.o. female seen for follow-up of cholangiocarcinoma.  Cycle 9 was on 09/07/2019.  Appetite is 75%.  Energy levels are 50%.  Reports mild fatigue.  Denies any tingling or numbness next 20s.  Denies any ringing in the ears.  Occasional constipation present.  No pains reported.  No fevers or night sweats.  REVIEW OF SYSTEMS:  Review of Systems  Constitutional: Positive for fatigue.  Gastrointestinal: Positive for constipation.  All other systems reviewed and are negative.    PAST MEDICAL/SURGICAL HISTORY:  Past Medical History:  Diagnosis Date  . Anxiety   . Arthritis   . Depression   . Hypertension    Past Surgical History:  Procedure  Laterality Date  . COLONOSCOPY N/A 10/23/2016   Procedure: COLONOSCOPY;  Surgeon: Danie Binder, MD;  Location[facility];  Service: Endoscopy;  Laterality: N/A;  11:30 Am  . POLYPECTOMY  10/23/2016   Procedure: POLYPECTOMY;  Surgeon: Danie Binder, MD;  Location: [facility];  Service: Endoscopy;;  sigmoid colon polyp  . PORTACATH PLACEMENT Left 02/16/2019   Procedure: INSERTION PORT-A-CATH (attached catheter in left subclavian);  Surgeon: Virl Cagey, MD;  Location: [facility] ORS;  Service: General;  Laterality: Left;  . RT HIP SURGERY    . TOTAL HIP ARTHROPLASTY Left 04/07/2016   Procedure: LEFT TOTAL HIP ARTHROPLASTY ANTERIOR APPROACH;  Surgeon: Paralee Cancel, MD;  Location: [facility];  Service: Orthopedics;  Laterality: Left;  Unsuccessful for Spinal, went to General     SOCIAL HISTORY:  Social History   Socioeconomic History  . Marital status: Single    Spouse name: Not on file  . Number of children: Not on file  . Years of education: Not on file  . Highest education level: Not on file  Occupational History  . Not on file  Social Needs  . Financial resource strain: Not hard at all  . Food insecurity    Worry: Never true    Inability: Never true  . Transportation needs    Medical: No    Non-medical: No  Tobacco Use  . Smoking status: Current Every Day Smoker    Packs/day: 0.25    Years: 42.00    Pack years: 10.50  . Smokeless tobacco: Never Used  Substance and Sexual Activity  . Alcohol use: No  . Drug use: No  . Sexual activity: Not Currently  Lifestyle  . Physical activity    Days per week: 0 days    Minutes per session: 0 min  . Stress: Only a little  Relationships  . Social Herbalist on phone: Three times a week    Gets together: Three times a week    Attends religious service: More than 4 times per year    Active member of club or organization: Yes    Attends meetings of clubs or organizations: More than 4 times per year    Relationship status: Never married  . Intimate partner violence    Fear of current or ex partner: No    Emotionally abused: No     Physically abused: No    Forced sexual activity: No  Other Topics Concern  . Not on file  Social History Narrative  . Not on file    FAMILY HISTORY:  Family History  Problem Relation Age of Onset  . Hypertension Mother   . Cancer Father   . Hypertension Brother   . Cancer Brother   . Hypertension Sister   . Cancer Sister     CURRENT MEDICATIONS:  Outpatient Encounter Medications as of 10/19/2019  Medication Sig  . amLODipine (NORVASC) 10 MG tablet Take 1 tablet (10 mg total) by mouth daily.  Marland Kitchen CISPLATIN IV Inject into the vein. Day 1, 8 every 21 days  . doxazosin (CARDURA) 2 MG tablet Take 2 mg by mouth every evening.   Marland Kitchen GEMCITABINE HCL IV Inject into the vein. Day 1, 8 every 21 days  . lidocaine (XYLOCAINE) 2 % solution Swish and swallow 1 tablespoon four times a day prn sore mouth  . lidocaine-prilocaine (EMLA) cream Apply to skin over port a cath one hour prior to chemotherapy appointment  . naproxen sodium (  ALEVE) 220 MG tablet Take 220 mg by mouth 2 (two) times daily as needed.   . potassium chloride (KLOR-CON) 10 MEQ tablet   . prochlorperazine (COMPAZINE) 10 MG tablet TAKE 1 TABLET (10 MG TOTAL) BY MOUTH EVERY 6 (SIX) HOURS AS NEEDED (NAUSEA OR VOMITING).  . vitamin B-12 (CYANOCOBALAMIN) 50 MCG tablet Take 50 mcg by mouth daily.  . [DISCONTINUED] prochlorperazine (COMPAZINE) 10 MG tablet TAKE 1 TABLET (10 MG TOTAL) BY MOUTH EVERY 6 (SIX) HOURS AS NEEDED (NAUSEA OR VOMITING).  . [EXPIRED] sodium chloride 0.9 % 1,000 mL with potassium chloride 20 mEq, magnesium sulfate 2 g infusion   . [DISCONTINUED] 0.9 %  sodium chloride infusion    No facility-administered encounter medications on file as of 10/19/2019.     ALLERGIES:  No Known Allergies   PHYSICAL EXAM:  ECOG Performance status: 1  Vitals:   10/19/19 0757  BP: (!) 151/53  Pulse: 78  Resp: 18  Temp: (!) 97.5 F (36.4 C)  SpO2: 100%   Filed Weights   10/19/19 0757  Weight: 207 lb 12.8 oz (94.3 kg)     Physical Exam Constitutional:      Appearance: Normal appearance. She is normal weight.  Cardiovascular:     Rate and Rhythm: Normal rate and regular rhythm.     Heart sounds: Normal heart sounds.  Pulmonary:     Effort: Pulmonary effort is normal.     Breath sounds: Normal breath sounds.  Abdominal:     General: Bowel sounds are normal.     Palpations: Abdomen is soft.  Musculoskeletal: Normal range of motion.  Skin:    General: Skin is warm and dry.  Neurological:     Mental Status: She is alert and oriented to person, place, and time. Mental status is at baseline.  Psychiatric:        Mood and Affect: Mood normal.        Behavior: Behavior normal.        Thought Content: Thought content normal.        Judgment: Judgment normal.      LABORATORY DATA:  I have reviewed the labs as listed.  CBC    Component Value Date/Time   WBC 5.3 10/19/2019 0753   RBC 2.38 (L) 10/19/2019 0753   HGB 8.1 (L) 10/19/2019 0753   HCT 25.2 (L) 10/19/2019 0753   PLT 171 10/19/2019 0753   MCV 105.9 (H) 10/19/2019 0753   MCH 34.0 10/19/2019 0753   MCHC 32.1 10/19/2019 0753   RDW 15.4 10/19/2019 0753   LYMPHSABS 2.0 10/19/2019 0753   MONOABS 0.6 10/19/2019 0753   EOSABS 0.2 10/19/2019 0753   BASOSABS 0.0 10/19/2019 0753   CMP Latest Ref Rng & Units 10/19/2019 10/02/2019 09/28/2019  Glucose 70 - 99 mg/dL 105(H) 101(H) 101(H)  BUN 8 - 23 mg/dL 23 19 13   Creatinine 0.44 - 1.00 mg/dL 1.40(H) 1.38(H) 1.51(H)  Sodium 135 - 145 mmol/L 139 139 137  Potassium 3.5 - 5.1 mmol/L 3.3(L) 3.3(L) 3.4(L)  Chloride 98 - 111 mmol/L 103 105 103  CO2 22 - 32 mmol/L 25 24 23   Calcium 8.9 - 10.3 mg/dL 9.3 9.0 9.0  Total Protein 6.5 - 8.1 g/dL 6.8 7.2 6.7  Total Bilirubin 0.3 - 1.2 mg/dL 0.7 0.5 0.5  Alkaline Phos 38 - 126 U/L 75 86 95  AST 15 - 41 U/L 15 14(L) 15  ALT 0 - 44 U/L 10 7 8     I have independently reviewed scans  and discussed with the patient.   ASSESSMENT & PLAN:   Cholangiocarcinoma  metastatic to liver ([place]) 1.  Metastatic cholangiocarcinoma: -Foundation 1 CDX MS-stable, no other actionable mutations. -9 cycles of gemcitabine and cisplatin from 02/22/2019 through 09/07/2019. -Last CT scan on 07/31/2019 showed improvement in liver metastasis after cycle 7. -CEA on 09/07/2019 was 8.7. -We reviewed results of the CT CAP from 10/17/2019.  Gallbladder fossa mass measures 1.8 x 2.3 cm, previously 2.2 x 2.8 cm.  Additional stable 12 mm hypoenhancing lesion inferiorly in segment 5.  There is a 9.4 x 10.5 cm right ovarian cystic lesion which is new.  This is questionable for ovarian neoplasm. -Today her creatinine is 1.4.  She will receive fluids.  I did not recommend any further chemotherapy.  We will closely watch her and do scans periodically.  Will consider second line therapy if she progresses. -We will send guardant 360 for targetable mutations. -I will reevaluate her in 4 weeks.  At that time we will consider further rescanning for ovarian cystic lesion.  2.  Hypokalemia: -She will continue potassium 20 mEq twice daily.  3.  Health maintenance: -Mammogram on 07/31/2019 was BI-RADS Category 1.   Total time spent is 25 minutes with more than 50% of the time spent face-to-face discussing treatment plan, counseling and coordination of care.  Orders placed this encounter:  No orders of the defined types were placed in this encounter.     Derek Jack, MD [place] [phone]

## 2019-10-19 NOTE — Patient Instructions (Signed)
[facility] at [facility] Discharge Instructions  Labs drawn from portacath today   Thank you for choosing [facility] at [facility] to provide your oncology and hematology care.  To afford each patient quality time with our provider, please arrive at least 15 minutes before your scheduled appointment time.   If you have a lab appointment with the Cancer Center please come in thru the Main Entrance and check in at the main information desk.  You need to re-schedule your appointment should you arrive 10 or more minutes late.  We strive to give you quality time with our providers, and arriving late affects you and other patients whose appointments are after yours.  Also, if you no show three or more times for appointments you may be dismissed from the clinic at the providers discretion.     Again, thank you for choosing [facility].  Our hope is that these requests will decrease the amount of time that you wait before being seen by our physicians.       _____________________________________________________________  Should you have questions after your visit to [facility], please contact our office at [phone] between the hours of 8:00 a.m. and 4:30 p.m.  Voicemails left after 4:00 p.m. will not be returned until the following business day.  For prescription refill requests, have your pharmacy contact our office and allow 72 hours.    Due to Covid, you will need to wear a mask upon entering the hospital. If you do not have a mask, a mask will be given to you at the Main Entrance upon arrival. For doctor visits, patients may have 1 support person with them. For treatment visits, patients can not have anyone with them due to social distancing guidelines and our immunocompromised population.     

## 2019-10-19 NOTE — Assessment & Plan Note (Signed)
1.  Metastatic cholangiocarcinoma: -Foundation 1 CDX MS-stable, no other actionable mutations. -9 cycles of gemcitabine and cisplatin from 02/22/2019 through 09/07/2019. -Last CT scan on 07/31/2019 showed improvement in liver metastasis after cycle 7. -CEA on 09/07/2019 was 8.7. -We reviewed results of the CT CAP from 10/17/2019.  Gallbladder fossa mass measures 1.8 x 2.3 cm, previously 2.2 x 2.8 cm.  Additional stable 12 mm hypoenhancing lesion inferiorly in segment 5.  There is a 9.4 x 10.5 cm right ovarian cystic lesion which is new.  This is questionable for ovarian neoplasm. -Today her creatinine is 1.4.  She will receive fluids.  I did not recommend any further chemotherapy.  We will closely watch her and do scans periodically.  Will consider second line therapy if she progresses. -We will send guardant 360 for targetable mutations. -I will reevaluate her in 4 weeks.  At that time we will consider further rescanning for ovarian cystic lesion.  2.  Hypokalemia: -She will continue potassium 20 mEq twice daily.  3.  Health maintenance: -Mammogram on 07/31/2019 was BI-RADS Category 1.

## 2019-10-19 NOTE — Progress Notes (Signed)
0900 Labs reviewed with and pt seen by Dr. Delton Coombes and no chemo tx today. Pt to received IV hydration with magnesium and potassium per MD order over 2 hours.         Nicole Ibarra tolerated IV hydration well without complaints or incident. VSS upon discharge. Pt discharged self ambulatory in satisfactory condition

## 2019-10-19 NOTE — Patient Instructions (Signed)
[facility] at [facility] Discharge Instructions  You were seen today by Dr. Katragadda. He went over your recent lab results. He will see you back in 1 month for labs and follow up.   Thank you for choosing [facility] at [facility] to provide your oncology and hematology care.  To afford each patient quality time with our provider, please arrive at least 15 minutes before your scheduled appointment time.   If you have a lab appointment with the Cancer Center please come in thru the  Main Entrance and check in at the main information desk  You need to re-schedule your appointment should you arrive 10 or more minutes late.  We strive to give you quality time with our providers, and arriving late affects you and other patients whose appointments are after yours.  Also, if you no show three or more times for appointments you may be dismissed from the clinic at the providers discretion.     Again, thank you for choosing [facility].  Our hope is that these requests will decrease the amount of time that you wait before being seen by our physicians.       _____________________________________________________________  Should you have questions after your visit to [facility], please contact our office at [phone] between the hours of 8:00 a.m. and 4:30 p.m.  Voicemails left after 4:00 p.m. will not be returned until the following business day.  For prescription refill requests, have your pharmacy contact our office and allow 72 hours.    Cancer Center Support Programs:   > Cancer Support Group  2nd Tuesday of the month 1pm-2pm, Journey Room    

## 2019-10-19 NOTE — Progress Notes (Signed)
Guardant 360 labs drawn and sent for testing.

## 2019-10-20 ENCOUNTER — Ambulatory Visit (HOSPITAL_COMMUNITY): Payer: Medicare Other

## 2019-10-20 LAB — CEA: CEA: 8.8 ng/mL — ABNORMAL HIGH (ref 0.0–4.7)

## 2019-11-16 ENCOUNTER — Ambulatory Visit (HOSPITAL_COMMUNITY): Payer: Medicare Other | Admitting: Hematology

## 2019-11-16 ENCOUNTER — Other Ambulatory Visit (HOSPITAL_COMMUNITY): Payer: Medicare Other

## 2019-11-21 ENCOUNTER — Other Ambulatory Visit (HOSPITAL_COMMUNITY): Payer: Self-pay | Admitting: Hematology

## 2019-11-21 DIAGNOSIS — C221 Intrahepatic bile duct carcinoma: Secondary | ICD-10-CM

## 2019-11-28 ENCOUNTER — Other Ambulatory Visit: Payer: Self-pay

## 2019-11-29 ENCOUNTER — Encounter (HOSPITAL_COMMUNITY): Payer: Self-pay | Admitting: Hematology

## 2019-11-29 ENCOUNTER — Other Ambulatory Visit (HOSPITAL_COMMUNITY): Payer: Self-pay | Admitting: *Deleted

## 2019-11-29 ENCOUNTER — Inpatient Hospital Stay (HOSPITAL_BASED_OUTPATIENT_CLINIC_OR_DEPARTMENT_OTHER): Payer: Medicare Other | Admitting: Hematology

## 2019-11-29 ENCOUNTER — Inpatient Hospital Stay (HOSPITAL_COMMUNITY): Payer: Medicare Other | Attending: Hematology

## 2019-11-29 VITALS — BP 163/64 | HR 69 | Temp 97.6°F | Resp 20 | Wt 213.8 lb

## 2019-11-29 DIAGNOSIS — Z809 Family history of malignant neoplasm, unspecified: Secondary | ICD-10-CM | POA: Insufficient documentation

## 2019-11-29 DIAGNOSIS — C221 Intrahepatic bile duct carcinoma: Secondary | ICD-10-CM

## 2019-11-29 DIAGNOSIS — G629 Polyneuropathy, unspecified: Secondary | ICD-10-CM | POA: Insufficient documentation

## 2019-11-29 DIAGNOSIS — N83201 Unspecified ovarian cyst, right side: Secondary | ICD-10-CM | POA: Insufficient documentation

## 2019-11-29 DIAGNOSIS — C787 Secondary malignant neoplasm of liver and intrahepatic bile duct: Secondary | ICD-10-CM

## 2019-11-29 DIAGNOSIS — C23 Malignant neoplasm of gallbladder: Secondary | ICD-10-CM | POA: Diagnosis not present

## 2019-11-29 DIAGNOSIS — M1990 Unspecified osteoarthritis, unspecified site: Secondary | ICD-10-CM | POA: Diagnosis not present

## 2019-11-29 DIAGNOSIS — Z79899 Other long term (current) drug therapy: Secondary | ICD-10-CM | POA: Diagnosis not present

## 2019-11-29 DIAGNOSIS — R200 Anesthesia of skin: Secondary | ICD-10-CM | POA: Insufficient documentation

## 2019-11-29 DIAGNOSIS — E876 Hypokalemia: Secondary | ICD-10-CM | POA: Diagnosis not present

## 2019-11-29 DIAGNOSIS — Z8249 Family history of ischemic heart disease and other diseases of the circulatory system: Secondary | ICD-10-CM | POA: Insufficient documentation

## 2019-11-29 DIAGNOSIS — F17210 Nicotine dependence, cigarettes, uncomplicated: Secondary | ICD-10-CM | POA: Diagnosis not present

## 2019-11-29 LAB — CBC WITH DIFFERENTIAL/PLATELET
Abs Immature Granulocytes: 0.01 10*3/uL (ref 0.00–0.07)
Basophils Absolute: 0 10*3/uL (ref 0.0–0.1)
Basophils Relative: 1 %
Eosinophils Absolute: 0.1 10*3/uL (ref 0.0–0.5)
Eosinophils Relative: 3 %
HCT: 30.9 % — ABNORMAL LOW (ref 36.0–46.0)
Hemoglobin: 9.8 g/dL — ABNORMAL LOW (ref 12.0–15.0)
Immature Granulocytes: 0 %
Lymphocytes Relative: 43 %
Lymphs Abs: 2 10*3/uL (ref 0.7–4.0)
MCH: 32.2 pg (ref 26.0–34.0)
MCHC: 31.7 g/dL (ref 30.0–36.0)
MCV: 101.6 fL — ABNORMAL HIGH (ref 80.0–100.0)
Monocytes Absolute: 0.3 10*3/uL (ref 0.1–1.0)
Monocytes Relative: 7 %
Neutro Abs: 2.2 10*3/uL (ref 1.7–7.7)
Neutrophils Relative %: 46 %
Platelets: 149 10*3/uL — ABNORMAL LOW (ref 150–400)
RBC: 3.04 MIL/uL — ABNORMAL LOW (ref 3.87–5.11)
RDW: 13.2 % (ref 11.5–15.5)
WBC: 4.7 10*3/uL (ref 4.0–10.5)
nRBC: 0 % (ref 0.0–0.2)

## 2019-11-29 LAB — COMPREHENSIVE METABOLIC PANEL
ALT: 9 U/L (ref 0–44)
AST: 16 U/L (ref 15–41)
Albumin: 3.9 g/dL (ref 3.5–5.0)
Alkaline Phosphatase: 92 U/L (ref 38–126)
Anion gap: 11 (ref 5–15)
BUN: 18 mg/dL (ref 8–23)
CO2: 25 mmol/L (ref 22–32)
Calcium: 9.5 mg/dL (ref 8.9–10.3)
Chloride: 105 mmol/L (ref 98–111)
Creatinine, Ser: 1.62 mg/dL — ABNORMAL HIGH (ref 0.44–1.00)
GFR calc Af Amer: 38 mL/min — ABNORMAL LOW (ref >60)
GFR calc non Af Amer: 33 mL/min — ABNORMAL LOW (ref >60)
Glucose, Bld: 112 mg/dL — ABNORMAL HIGH (ref 70–99)
Potassium: 3.9 mmol/L (ref 3.5–5.1)
Sodium: 141 mmol/L (ref 135–145)
Total Bilirubin: 0.6 mg/dL (ref 0.3–1.2)
Total Protein: 7.2 g/dL (ref 6.5–8.1)

## 2019-11-29 NOTE — Patient Instructions (Signed)
[facility] at [facility] Discharge Instructions  You were seen today by Dr. Delton Coombes. He went over your recent lab results. He will see you back in 6 weeks for labs, repeat scans and follow up.   Thank you for choosing [facility] to provide your oncology and hematology care.  To afford each patient quality time with our provider, please arrive at least 15 minutes before your scheduled appointment time.   If you have a lab appointment with the [facility] please come in thru the  Main Entrance and check in at the main information desk  You need to re-schedule your appointment should you arrive 10 or more minutes late.  We strive to give you quality time with our providers, and arriving late affects you and other patients whose appointments are after yours.  Also, if you no show three or more times for appointments you may be dismissed from the clinic at the providers discretion.     Again, thank you for choosing [facility].  Our hope is that these requests will decrease the amount of time that you wait before being seen by our physicians.       _____________________________________________________________  Should you have questions after your visit to [facility], please contact our office at [phone] between the hours of 8:00 a.m. and 4:30 p.m.  Voicemails left after 4:00 p.m. will not be returned until the following business day.  For prescription refill requests, have your pharmacy contact our office and allow 72 hours.    Cancer Center Support Programs:   > Cancer Support Group  2nd Tuesday of the month 1pm-2pm, Journey Room

## 2019-11-29 NOTE — Progress Notes (Signed)
[place], [place] [address]   CLINIC:  Medical Oncology/Hematology  PCP:  Cory Munch, PA-C [place] [address] [phone]   REASON FOR VISIT: Follow-up for nausea and vomiting  CURRENT THERAPY: Cisplatin and gemcitabine.  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[place])   02/22/2019 -  Chemotherapy   The patient had palonosetron (ALOXI) injection 0.25 mg, 0.25 mg, Intravenous,  Once, 10 of 10 cycles Administration: 0.25 mg (02/22/2019), 0.25 mg (03/01/2019), 0.25 mg (03/15/2019), 0.25 mg (03/22/2019), 0.25 mg (04/06/2019), 0.25 mg (04/13/2019), 0.25 mg (04/27/2019), 0.25 mg (05/04/2019), 0.25 mg (05/18/2019), 0.25 mg (05/25/2019), 0.25 mg (06/16/2019), 0.25 mg (06/22/2019), 0.25 mg (07/13/2019), 0.25 mg (07/20/2019), 0.25 mg (08/10/2019), 0.25 mg (08/24/2019), 0.25 mg (09/07/2019), 0.25 mg (09/14/2019) pegfilgrastim-cbqv (UDENYCA) injection 6 mg, 6 mg, Subcutaneous, Once, 10 of 10 cycles Administration: 6 mg (03/02/2019), 6 mg (03/24/2019), 6 mg (05/05/2019), 6 mg (05/26/2019), 6 mg (06/23/2019), 6 mg (07/21/2019), 6 mg (08/25/2019), 6 mg (09/15/2019) CISplatin (PLATINOL) 52 mg in sodium chloride 0.9 % 250 mL chemo infusion, 25 mg/m2 = 52 mg, Intravenous,  Once, 10 of 10 cycles Dose modification: 20 mg/m2 (80 % of original dose 25 mg/m2, Cycle 9, Reason: Other (see comments), Comment: renal insufficiency) Administration: 52 mg (02/22/2019), 52 mg (03/01/2019), 52 mg (03/15/2019), 52 mg (03/22/2019), 52 mg (04/06/2019), 52 mg (04/13/2019), 52 mg (04/27/2019), 52 mg (05/04/2019), 52 mg (05/18/2019), 52 mg (05/25/2019), 52 mg (06/16/2019), 52 mg (06/22/2019), 52 mg (07/13/2019), 52 mg (07/20/2019), 52 mg (08/10/2019), 52 mg (08/24/2019), 42 mg (09/07/2019), 42 mg (09/14/2019) gemcitabine (GEMZAR) 2,000 mg in sodium chloride 0.9 % 250 mL chemo infusion,  2,090 mg, Intravenous,  Once, 10 of 10 cycles Dose modification: 750 mg/m2 (75 % of original dose 1,000 mg/m2, Cycle 4, Reason: Other (see comments), Comment: neutropenia), 750 mg/m2 (75 % of original dose 1,000 mg/m2, Cycle 7, Reason: Other (see comments), Comment: neutripenia) Administration: 2,000 mg (02/22/2019), 2,000 mg (03/01/2019), 2,000 mg (03/15/2019), 2,000 mg (03/22/2019), 2,000 mg (04/06/2019), 2,000 mg (04/13/2019), 2,000 mg (04/27/2019), 1,558 mg (05/04/2019), 2,000 mg (05/18/2019), 2,000 mg (05/25/2019), 2,000 mg (06/16/2019), 2,000 mg (06/22/2019), 2,000 mg (07/13/2019), 1,558 mg (07/20/2019), 2,000 mg (08/10/2019), 2,000 mg (08/24/2019), 2,000 mg (09/07/2019), 2,000 mg (09/14/2019) fosaprepitant (EMEND) 150 mg, dexamethasone (DECADRON) 12 mg in sodium chloride 0.9 % 145 mL IVPB, , Intravenous,  Once, 10 of 10 cycles Administration:  (02/22/2019),  (03/01/2019),  (03/15/2019),  (03/22/2019),  (04/06/2019),  (04/13/2019),  (04/27/2019),  (05/04/2019),  (05/18/2019),  (05/25/2019),  (06/16/2019),  (06/22/2019),  (07/13/2019),  (07/20/2019),  (08/10/2019),  (08/24/2019),  (09/07/2019),  (09/14/2019)  for chemotherapy treatment.       INTERVAL HISTORY:  Ms. Lodico 63 y.o. female seen for follow-up of cholangiocarcinoma.  Last chemotherapy was on 09/07/2019.  She reported numbness in the feet which is constant.  Denies any burning sensation.  No numbness in the hands reported.  Appetite and energy levels are 100%.  She gained 6 pounds from last visit.  No abdominal pains reported.  She is able to do all her household activities.  REVIEW OF SYSTEMS:  Review of Systems  Neurological: Positive for numbness.  All other systems reviewed and are negative.    PAST MEDICAL/SURGICAL HISTORY:  Past Medical History:  Diagnosis Date  . Anxiety   . Arthritis   . Depression   . Hypertension  Past Surgical History:  Procedure Laterality Date  . COLONOSCOPY N/A 10/23/2016   Procedure: COLONOSCOPY;  Surgeon: Danie Binder, MD;   Location: [facility];  Service: Endoscopy;  Laterality: N/A;  11:30 Am  . POLYPECTOMY  10/23/2016   Procedure: POLYPECTOMY;  Surgeon: Danie Binder, MD;  Location: [facility];  Service: Endoscopy;;  sigmoid colon polyp  . PORTACATH PLACEMENT Left 02/16/2019   Procedure: INSERTION PORT-A-CATH (attached catheter in left subclavian);  Surgeon: Virl Cagey, MD;  Location: [facility];  Service: General;  Laterality: Left;  . RT HIP SURGERY    . TOTAL HIP ARTHROPLASTY Left 04/07/2016   Procedure: LEFT TOTAL HIP ARTHROPLASTY ANTERIOR APPROACH;  Surgeon: Paralee Cancel, MD;  Location: [facility];  Service: Orthopedics;  Laterality: Left;  Unsuccessful for Spinal, went to General     SOCIAL HISTORY:  Social History   Socioeconomic History  . Marital status: Single    Spouse name: Not on file  . Number of children: Not on file  . Years of education: Not on file  . Highest education level: Not on file  Occupational History  . Not on file  Tobacco Use  . Smoking status: Current Every Day Smoker    Packs/day: 0.25    Years: 42.00    Pack years: 10.50  . Smokeless tobacco: Never Used  Substance and Sexual Activity  . Alcohol use: No  . Drug use: No  . Sexual activity: Not Currently  Other Topics Concern  . Not on file  Social History Narrative  . Not on file   Social Determinants of Health   Financial Resource Strain: Low Risk   . Difficulty of Paying Living Expenses: Not hard at all  Food Insecurity: No Food Insecurity  . Worried About Charity fundraiser in the Last Year: Never true  . Ran Out of Food in the Last Year: Never true  Transportation Needs: No Transportation Needs  . Lack of Transportation (Medical): No  . Lack of Transportation (Non-Medical): No  Physical Activity: Inactive  . Days of Exercise per Week: 0 days  . Minutes of Exercise per Session: 0 min  Stress: No Stress Concern Present  . Feeling of Stress : Only a little  Social Connections: Slightly Isolated    . Frequency of Communication with Friends and Family: Three times a week  . Frequency of Social Gatherings with Friends and Family: Three times a week  . Attends Religious Services: More than 4 times per year  . Active Member of Clubs or Organizations: Yes  . Attends Archivist Meetings: More than 4 times per year  . Marital Status: Never married  Intimate Partner Violence: Not At Risk  . Fear of Current or Ex-Partner: No  . Emotionally Abused: No  . Physically Abused: No  . Sexually Abused: No    FAMILY HISTORY:  Family History  Problem Relation Age of Onset  . Hypertension Mother   . Cancer Father   . Hypertension Brother   . Cancer Brother   . Hypertension Sister   . Cancer Sister     CURRENT MEDICATIONS:  Outpatient Encounter Medications as of 11/29/2019  Medication Sig  . amLODipine (NORVASC) 10 MG tablet Take 1 tablet (10 mg total) by mouth daily.  Marland Kitchen CISPLATIN IV Inject into the vein. Day 1, 8 every 21 days  . doxazosin (CARDURA) 2 MG tablet Take 2 mg by mouth every evening.   Marland Kitchen GEMCITABINE HCL IV Inject into the  vein. Day 1, 8 every 21 days  . potassium chloride (KLOR-CON) 10 MEQ tablet TAKE 2 TABLETS (20 MEQ TOTAL) BY MOUTH 2 (TWO) TIMES DAILY.  . vitamin B-12 (CYANOCOBALAMIN) 50 MCG tablet Take 50 mcg by mouth daily.  Marland Kitchen lidocaine (XYLOCAINE) 2 % solution Swish and swallow 1 tablespoon four times a day prn sore mouth (Patient not taking: Reported on 11/29/2019)  . lidocaine-prilocaine (EMLA) cream Apply to skin over port a cath one hour prior to chemotherapy appointment (Patient not taking: Reported on 11/29/2019)  . naproxen sodium (ALEVE) 220 MG tablet Take 220 mg by mouth 2 (two) times daily as needed.   . prochlorperazine (COMPAZINE) 10 MG tablet TAKE 1 TABLET (10 MG TOTAL) BY MOUTH EVERY 6 (SIX) HOURS AS NEEDED (NAUSEA OR VOMITING). (Patient not taking: Reported on 11/29/2019)  . [DISCONTINUED] prochlorperazine (COMPAZINE) 10 MG tablet TAKE 1 TABLET (10  MG TOTAL) BY MOUTH EVERY 6 (SIX) HOURS AS NEEDED (NAUSEA OR VOMITING).   No facility-administered encounter medications on file as of 11/29/2019.    ALLERGIES:  No Known Allergies   PHYSICAL EXAM:  ECOG Performance status: 1  Vitals:   11/29/19 1016  BP: (!) 163/64  Pulse: 69  Resp: 20  Temp: 97.6 F (36.4 C)  SpO2: 100%   Filed Weights   11/29/19 1016  Weight: 213 lb 12.8 oz (97 kg)    Physical Exam Constitutional:      Appearance: Normal appearance. She is normal weight.  Cardiovascular:     Rate and Rhythm: Normal rate and regular rhythm.     Heart sounds: Normal heart sounds.  Pulmonary:     Effort: Pulmonary effort is normal.     Breath sounds: Normal breath sounds.  Abdominal:     General: Bowel sounds are normal.     Palpations: Abdomen is soft.  Musculoskeletal:        General: Normal range of motion.  Skin:    General: Skin is warm and dry.  Neurological:     Mental Status: She is alert and oriented to person, place, and time. Mental status is at baseline.  Psychiatric:        Mood and Affect: Mood normal.        Behavior: Behavior normal.        Thought Content: Thought content normal.        Judgment: Judgment normal.      LABORATORY DATA:  I have reviewed the labs as listed.  CBC    Component Value Date/Time   WBC 4.7 11/29/2019 0956   RBC 3.04 (L) 11/29/2019 0956   HGB 9.8 (L) 11/29/2019 0956   HCT 30.9 (L) 11/29/2019 0956   PLT 149 (L) 11/29/2019 0956   MCV 101.6 (H) 11/29/2019 0956   MCH 32.2 11/29/2019 0956   MCHC 31.7 11/29/2019 0956   RDW 13.2 11/29/2019 0956   LYMPHSABS 2.0 11/29/2019 0956   MONOABS 0.3 11/29/2019 0956   EOSABS 0.1 11/29/2019 0956   BASOSABS 0.0 11/29/2019 0956   CMP Latest Ref Rng & Units 11/29/2019 10/19/2019 10/02/2019  Glucose 70 - 99 mg/dL 112(H) 105(H) 101(H)  BUN 8 - 23 mg/dL _0 Creatinine 0.44 - 1.00 mg/dL 1.62(H) 1.40(H) 1.38(H)  Sodium 135 - 145 mmol/L 141 139 139  Potassium 3.5 - 5.1  mmol/L 3.9 3.3(L) 3.3(L)  Chloride 98 - 111 mmol/L 105 103 105  CO2 22 - 32 mmol/L _1 Calcium 8.9 - 10.3 mg/dL 9.5 9.3 9.0  Total Protein 6.5 - 8.1 g/dL 7.2 6.8 7.2  Total Bilirubin 0.3 - 1.2 mg/dL 0.6 0.7 0.5  Alkaline Phos 38 - 126 U/L 92 75 86  AST 15 - 41 U/L 16 15 14(L)  ALT 0 - 44 U/L _0 I have independently reviewed scans and discussed with the patient.   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  Metastatic cholangiocarcinoma: -Foundation 1 CDX MS-stable, no other actionable mutations. -9 cycles of gemcitabine and cisplatin from 02/22/2019 through 09/07/2019. -Last CT scan on 07/31/2019 showed improvement in liver metastasis after cycle 7. -CEA on 09/07/2019 was 8.7. -CT CAP on 10/17/2019 showed gallbladder fossa mass measures 1.8 x 2.3 cm, previously 2.2 x 2.8 cm.  Additional stable 12 mm hypoenhancing lesion inferiorly in segment 5.  There is a 9.4 x 10.5 cm right ovarian cystic lesion which is new.  This is questionable for ovarian neoplasm. -Guardant 360 on 11/01/2019 showed MSI high not detected, FGFR1 V248M variant of unknown significance. NOTCH1 P2122P synonymous alteration, unlikely to be a therapeutic target. -Creatinine is 1.62.  She was told to drink 2 to 3 L of water per day.  She is reportedly taking Aleve as needed.  She does not take it on a regular basis.  I have reviewed her medications for any nephrotoxic agents. -I plan to see her back in 6 weeks with repeat scans.  I might consider repeating a biopsy to see if she has any targetable mutations by foundation 1.  2.  Hypokalemia: -Potassium is 3.9 today.  She will continue potassium twice daily.  3.  Health maintenance: -Mammogram on 07/31/2019 was BI-RADS Category 1.  4.  Peripheral neuropathy: -She feels numbness in the feet.  No pins-and-needles sensation. -No numbness in the hands reported.  She was told to call us if she develops pins-and-needles sensation when we  can start gabapentin.   Orders placed this encounter:  Orders Placed This Encounter  Procedures  . CT Chest W Contrast  . CT Abdomen Pelvis W Contrast  . Iron and TIBC  . Ferritin  . Vitamin B12  . Folate  . CBC with Differential/Platelet  . Comprehensive metabolic panel  . CEA      Derek Jack, MD [place] [phone]

## 2019-11-29 NOTE — Assessment & Plan Note (Signed)
1.  Metastatic cholangiocarcinoma: -Foundation 1 CDX MS-stable, no other actionable mutations. -9 cycles of gemcitabine and cisplatin from 02/22/2019 through 09/07/2019. -Last CT scan on 07/31/2019 showed improvement in liver metastasis after cycle 7. -CEA on 09/07/2019 was 8.7. -CT CAP on 10/17/2019 showed gallbladder fossa mass measures 1.8 x 2.3 cm, previously 2.2 x 2.8 cm.  Additional stable 12 mm hypoenhancing lesion inferiorly in segment 5.  There is a 9.4 x 10.5 cm right ovarian cystic lesion which is new.  This is questionable for ovarian neoplasm. -Guardant 360 on 11/01/2019 showed MSI high not detected, FGFR1 V248M variant of unknown significance. NOTCH1 P2122P synonymous alteration, unlikely to be a therapeutic target. -Creatinine is 1.62.  She was told to drink 2 to 3 L of water per day.  She is reportedly taking Aleve as needed.  She does not take it on a regular basis.  I have reviewed her medications for any nephrotoxic agents. -I plan to see her back in 6 weeks with repeat scans.  I might consider repeating a biopsy to see if she has any targetable mutations by foundation 1.  2.  Hypokalemia: -Potassium is 3.9 today.  She will continue potassium twice daily.  3.  Health maintenance: -Mammogram on 07/31/2019 was BI-RADS Category 1.  4.  Peripheral neuropathy: -She feels numbness in the feet.  No pins-and-needles sensation. -No numbness in the hands reported.  She was told to call us if she develops pins-and-needles sensation when we can start gabapentin.

## 2019-11-30 LAB — CEA: CEA: 10.4 ng/mL — ABNORMAL HIGH (ref 0.0–4.7)

## 2020-01-04 ENCOUNTER — Ambulatory Visit (HOSPITAL_COMMUNITY)
Admission: RE | Admit: 2020-01-04 | Discharge: 2020-01-04 | Disposition: A | Discharge status: Home / Self Care | Payer: Medicare Other | Source: Ambulatory Visit | Attending: Hematology | Admitting: Hematology

## 2020-01-04 ENCOUNTER — Inpatient Hospital Stay (HOSPITAL_COMMUNITY): Payer: Medicare Other | Attending: Hematology

## 2020-01-04 ENCOUNTER — Other Ambulatory Visit: Payer: Self-pay

## 2020-01-04 DIAGNOSIS — R970 Elevated carcinoembryonic antigen [CEA]: Secondary | ICD-10-CM | POA: Diagnosis not present

## 2020-01-04 DIAGNOSIS — C787 Secondary malignant neoplasm of liver and intrahepatic bile duct: Secondary | ICD-10-CM | POA: Diagnosis present

## 2020-01-04 DIAGNOSIS — C221 Intrahepatic bile duct carcinoma: Secondary | ICD-10-CM | POA: Diagnosis not present

## 2020-01-04 LAB — CBC WITH DIFFERENTIAL/PLATELET
Abs Immature Granulocytes: 0 10*3/uL (ref 0.00–0.07)
Basophils Absolute: 0 10*3/uL (ref 0.0–0.1)
Basophils Relative: 1 %
Eosinophils Absolute: 0.1 10*3/uL (ref 0.0–0.5)
Eosinophils Relative: 4 %
HCT: 32.8 % — ABNORMAL LOW (ref 36.0–46.0)
Hemoglobin: 10.8 g/dL — ABNORMAL LOW (ref 12.0–15.0)
Immature Granulocytes: 0 %
Lymphocytes Relative: 37 %
Lymphs Abs: 1.3 10*3/uL (ref 0.7–4.0)
MCH: 30.7 pg (ref 26.0–34.0)
MCHC: 32.9 g/dL (ref 30.0–36.0)
MCV: 93.2 fL (ref 80.0–100.0)
Monocytes Absolute: 0.3 10*3/uL (ref 0.1–1.0)
Monocytes Relative: 9 %
Neutro Abs: 1.8 10*3/uL (ref 1.7–7.7)
Neutrophils Relative %: 49 %
Platelets: 126 10*3/uL — ABNORMAL LOW (ref 150–400)
RBC: 3.52 MIL/uL — ABNORMAL LOW (ref 3.87–5.11)
RDW: 14.7 % (ref 11.5–15.5)
WBC: 3.6 10*3/uL — ABNORMAL LOW (ref 4.0–10.5)
nRBC: 0 % (ref 0.0–0.2)

## 2020-01-04 LAB — COMPREHENSIVE METABOLIC PANEL
ALT: 96 U/L — ABNORMAL HIGH (ref 0–44)
AST: 111 U/L — ABNORMAL HIGH (ref 15–41)
Albumin: 3.8 g/dL (ref 3.5–5.0)
Alkaline Phosphatase: 496 U/L — ABNORMAL HIGH (ref 38–126)
Anion gap: 11 (ref 5–15)
BUN: 9 mg/dL (ref 8–23)
CO2: 27 mmol/L (ref 22–32)
Calcium: 9.4 mg/dL (ref 8.9–10.3)
Chloride: 100 mmol/L (ref 98–111)
Creatinine, Ser: 1.58 mg/dL — ABNORMAL HIGH (ref 0.44–1.00)
GFR calc Af Amer: 40 mL/min — ABNORMAL LOW (ref >60)
GFR calc non Af Amer: 34 mL/min — ABNORMAL LOW (ref >60)
Glucose, Bld: 117 mg/dL — ABNORMAL HIGH (ref 70–99)
Potassium: 3 mmol/L — ABNORMAL LOW (ref 3.5–5.1)
Sodium: 138 mmol/L (ref 135–145)
Total Bilirubin: 4.6 mg/dL — ABNORMAL HIGH (ref 0.3–1.2)
Total Protein: 7.5 g/dL (ref 6.5–8.1)

## 2020-01-04 LAB — IRON AND TIBC
Iron: 95 ug/dL (ref 28–170)
Saturation Ratios: 28 % (ref 10.4–31.8)
TIBC: 343 ug/dL (ref 250–450)
UIBC: 248 ug/dL

## 2020-01-04 LAB — FERRITIN: Ferritin: 575 ng/mL — ABNORMAL HIGH (ref 11–307)

## 2020-01-04 LAB — VITAMIN B12: Vitamin B-12: 4688 pg/mL — ABNORMAL HIGH (ref 180–914)

## 2020-01-04 LAB — FOLATE: Folate: 5.2 ng/mL — ABNORMAL LOW (ref >5.9)

## 2020-01-04 MED ORDER — IOHEXOL 300 MG/ML  SOLN
100.0000 mL | Freq: Once | INTRAMUSCULAR | Status: AC | PRN
  Administered 2020-01-04: 100 mL via INTRAVENOUS

## 2020-01-05 LAB — CEA: CEA: 14 ng/mL — ABNORMAL HIGH (ref 0.0–4.7)

## 2020-01-10 ENCOUNTER — Inpatient Hospital Stay (HOSPITAL_COMMUNITY): Payer: Medicare Other | Attending: Hematology | Admitting: Hematology

## 2020-01-10 ENCOUNTER — Other Ambulatory Visit: Payer: Self-pay

## 2020-01-10 ENCOUNTER — Encounter (HOSPITAL_COMMUNITY): Payer: Self-pay | Admitting: Hematology

## 2020-01-10 ENCOUNTER — Other Ambulatory Visit (HOSPITAL_COMMUNITY): Payer: Self-pay | Admitting: *Deleted

## 2020-01-10 ENCOUNTER — Inpatient Hospital Stay (HOSPITAL_COMMUNITY): Payer: Medicare Other

## 2020-01-10 VITALS — BP 144/68 | HR 77 | Temp 97.1°F | Resp 14 | Wt 205.3 lb

## 2020-01-10 DIAGNOSIS — N83201 Unspecified ovarian cyst, right side: Secondary | ICD-10-CM | POA: Diagnosis not present

## 2020-01-10 DIAGNOSIS — C240 Malignant neoplasm of extrahepatic bile duct: Secondary | ICD-10-CM | POA: Diagnosis not present

## 2020-01-10 DIAGNOSIS — Z809 Family history of malignant neoplasm, unspecified: Secondary | ICD-10-CM | POA: Diagnosis not present

## 2020-01-10 DIAGNOSIS — Z79899 Other long term (current) drug therapy: Secondary | ICD-10-CM | POA: Insufficient documentation

## 2020-01-10 DIAGNOSIS — R7401 Elevation of levels of liver transaminase levels: Secondary | ICD-10-CM | POA: Diagnosis not present

## 2020-01-10 DIAGNOSIS — E876 Hypokalemia: Secondary | ICD-10-CM | POA: Diagnosis not present

## 2020-01-10 DIAGNOSIS — R918 Other nonspecific abnormal finding of lung field: Secondary | ICD-10-CM | POA: Diagnosis not present

## 2020-01-10 DIAGNOSIS — L299 Pruritus, unspecified: Secondary | ICD-10-CM | POA: Insufficient documentation

## 2020-01-10 DIAGNOSIS — N838 Other noninflammatory disorders of ovary, fallopian tube and broad ligament: Secondary | ICD-10-CM | POA: Diagnosis not present

## 2020-01-10 DIAGNOSIS — R110 Nausea: Secondary | ICD-10-CM | POA: Insufficient documentation

## 2020-01-10 DIAGNOSIS — C787 Secondary malignant neoplasm of liver and intrahepatic bile duct: Secondary | ICD-10-CM | POA: Diagnosis not present

## 2020-01-10 DIAGNOSIS — R200 Anesthesia of skin: Secondary | ICD-10-CM | POA: Diagnosis not present

## 2020-01-10 DIAGNOSIS — G629 Polyneuropathy, unspecified: Secondary | ICD-10-CM | POA: Insufficient documentation

## 2020-01-10 DIAGNOSIS — Z8249 Family history of ischemic heart disease and other diseases of the circulatory system: Secondary | ICD-10-CM | POA: Diagnosis not present

## 2020-01-10 DIAGNOSIS — F17210 Nicotine dependence, cigarettes, uncomplicated: Secondary | ICD-10-CM | POA: Diagnosis not present

## 2020-01-10 DIAGNOSIS — E538 Deficiency of other specified B group vitamins: Secondary | ICD-10-CM | POA: Diagnosis not present

## 2020-01-10 DIAGNOSIS — R1110 Vomiting, unspecified: Secondary | ICD-10-CM | POA: Diagnosis not present

## 2020-01-10 DIAGNOSIS — C221 Intrahepatic bile duct carcinoma: Secondary | ICD-10-CM

## 2020-01-10 DIAGNOSIS — R599 Enlarged lymph nodes, unspecified: Secondary | ICD-10-CM | POA: Diagnosis not present

## 2020-01-10 DIAGNOSIS — R112 Nausea with vomiting, unspecified: Secondary | ICD-10-CM | POA: Insufficient documentation

## 2020-01-10 LAB — HEPATIC FUNCTION PANEL
ALT: 28 U/L (ref 0–44)
AST: 22 U/L (ref 15–41)
Albumin: 4.1 g/dL (ref 3.5–5.0)
Alkaline Phosphatase: 38 U/L (ref 38–126)
Bilirubin, Direct: 0.1 mg/dL (ref 0.0–0.2)
Indirect Bilirubin: 0.6 mg/dL (ref 0.3–0.9)
Total Bilirubin: 0.7 mg/dL (ref 0.3–1.2)
Total Protein: 7.3 g/dL (ref 6.5–8.1)

## 2020-01-10 NOTE — Assessment & Plan Note (Addendum)
1.  Metastatic cholangiocarcinoma: -Foundation 1 CDX MS-stable, no other actionable mutations. -9 cycles of gemcitabine and cisplatin from 02/22/2019 through 09/07/2019. -Last CT scan on 07/31/2019 showed improvement in liver metastasis after cycle 7. -CEA on 09/07/2019 was 8.7. -CT CAP on 10/17/2019 showed gallbladder fossa mass measures 1.8 x 2.3 cm, previously 2.2 x 2.8 cm.  Additional stable 12 mm hypoenhancing lesion inferiorly in segment 5.  There is a 9.4 x 10.5 cm right ovarian cystic lesion which is new.  This is questionable for ovarian neoplasm. -Guardant 360 on 11/01/2019 showed MSI high not detected, FGFR1 V248M variant of unknown significance. NOTCH1 P2122P synonymous alteration, unlikely to be a therapeutic target. -We reviewed results of the CT CAP from 01/04/2020.  Enlarging septated cystic mass arising from the adnexa, measuring 279 0 cm in volume, previously 420 cm on 10/17/2019.  Large mass displaces surrounding bowel loops.  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  Stable porta hepatic adenopathy.  Mass along the gallbladder fossa is difficult to measure based on orientation.  Coronal plane measures 2.9 x 2.7 cm, previously 2.7 x 2.5 cm.  Mild but increased intrahepatic biliary dilatation.  Several small pulmonary nodules are stable. -Her total bilirubin last week was elevated at 4.6.  Hence I have recommended MRCP. -We have repeated LFTs today which showed normal total bilirubin.  We will cancel the MRCP. 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  We will check CA-125. -Her CEA has been steadily going up, 14 on 12/27/2019. -We will likely start her chemotherapy back.  I will also consider biopsy for foundation 1 testing.  2.  Hypokalemia: -Potassium on last week labs was low at 3.0.  She reportedly did not take potassium prior to blood testing.  She is taking 20 mg twice daily.  3.  Health maintenance: -Mammogram on 07/31/2019 was BI-RADS Category 1.  4.  Peripheral  neuropathy: -She has some numbness in the feet with no pins-and-needles sensation.  5.  Folic acid deficiency: -Folic acid was low.  She was prescribed folic acid 1 mg daily.

## 2020-01-10 NOTE — Patient Instructions (Addendum)
[place] at [facility] Discharge Instructions  You were seen today by Dr. Delton Coombes. He went over your recent lab results. He will have more blood drawn today before you leave. He will schedule you for a MRCP scan for further evaluation. He will see you back after your scan for follow up.   Thank you for choosing [place] at [facility] to provide your oncology and hematology care.  To afford each patient quality time with our provider, please arrive at least 15 minutes before your scheduled appointment time.   If you have a lab appointment with the [facility] please come in thru the  Main Entrance and check in at the main information desk  You need to re-schedule your appointment should you arrive 10 or more minutes late.  We strive to give you quality time with our providers, and arriving late affects you and other patients whose appointments are after yours.  Also, if you no show three or more times for appointments you may be dismissed from the clinic at the providers discretion.     Again, thank you for choosing [facility].  Our hope is that these requests will decrease the amount of time that you wait before being seen by our physicians.       _____________________________________________________________  Should you have questions after your visit to [facility], please contact our office at [phone] between the hours of 8:00 a.m. and 4:30 p.m.  Voicemails left after 4:00 p.m. will not be returned until the following business day.  For prescription refill requests, have your pharmacy contact our office and allow 72 hours.    Cancer Center Support Programs:   > Cancer Support Group  2nd Tuesday of the month 1pm-2pm, Journey Room

## 2020-01-10 NOTE — Progress Notes (Signed)
[place], [place] [address]   CLINIC:  Medical Oncology/Hematology  PCP:  Cory Munch, PA-C [place] [address] [phone]   REASON FOR VISIT: Follow-up for nausea and vomiting  CURRENT THERAPY: Cisplatin and gemcitabine.  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[place])   02/22/2019 -  Chemotherapy   The patient had palonosetron (ALOXI) injection 0.25 mg, 0.25 mg, Intravenous,  Once, 10 of 10 cycles Administration: 0.25 mg (02/22/2019), 0.25 mg (03/01/2019), 0.25 mg (03/15/2019), 0.25 mg (03/22/2019), 0.25 mg (04/06/2019), 0.25 mg (04/13/2019), 0.25 mg (04/27/2019), 0.25 mg (05/04/2019), 0.25 mg (05/18/2019), 0.25 mg (05/25/2019), 0.25 mg (06/16/2019), 0.25 mg (06/22/2019), 0.25 mg (07/13/2019), 0.25 mg (07/20/2019), 0.25 mg (08/10/2019), 0.25 mg (08/24/2019), 0.25 mg (09/07/2019), 0.25 mg (09/14/2019) pegfilgrastim-cbqv (UDENYCA) injection 6 mg, 6 mg, Subcutaneous, Once, 10 of 10 cycles Administration: 6 mg (03/02/2019), 6 mg (03/24/2019), 6 mg (05/05/2019), 6 mg (05/26/2019), 6 mg (06/23/2019), 6 mg (07/21/2019), 6 mg (08/25/2019), 6 mg (09/15/2019) CISplatin (PLATINOL) 52 mg in sodium chloride 0.9 % 250 mL chemo infusion, 25 mg/m2 = 52 mg, Intravenous,  Once, 10 of 10 cycles Dose modification: 20 mg/m2 (80 % of original dose 25 mg/m2, Cycle 9, Reason: Other (see comments), Comment: renal insufficiency) Administration: 52 mg (02/22/2019), 52 mg (03/01/2019), 52 mg (03/15/2019), 52 mg (03/22/2019), 52 mg (04/06/2019), 52 mg (04/13/2019), 52 mg (04/27/2019), 52 mg (05/04/2019), 52 mg (05/18/2019), 52 mg (05/25/2019), 52 mg (06/16/2019), 52 mg (06/22/2019), 52 mg (07/13/2019), 52 mg (07/20/2019), 52 mg (08/10/2019), 52 mg (08/24/2019), 42 mg (09/07/2019), 42 mg (09/14/2019) gemcitabine (GEMZAR) 2,000 mg in sodium chloride 0.9 % 250 mL chemo infusion,  2,090 mg, Intravenous,  Once, 10 of 10 cycles Dose modification: 750 mg/m2 (75 % of original dose 1,000 mg/m2, Cycle 4, Reason: Other (see comments), Comment: neutropenia), 750 mg/m2 (75 % of original dose 1,000 mg/m2, Cycle 7, Reason: Other (see comments), Comment: neutripenia) Administration: 2,000 mg (02/22/2019), 2,000 mg (03/01/2019), 2,000 mg (03/15/2019), 2,000 mg (03/22/2019), 2,000 mg (04/06/2019), 2,000 mg (04/13/2019), 2,000 mg (04/27/2019), 1,558 mg (05/04/2019), 2,000 mg (05/18/2019), 2,000 mg (05/25/2019), 2,000 mg (06/16/2019), 2,000 mg (06/22/2019), 2,000 mg (07/13/2019), 1,558 mg (07/20/2019), 2,000 mg (08/10/2019), 2,000 mg (08/24/2019), 2,000 mg (09/07/2019), 2,000 mg (09/14/2019) fosaprepitant (EMEND) 150 mg, dexamethasone (DECADRON) 12 mg in sodium chloride 0.9 % 145 mL IVPB, , Intravenous,  Once, 10 of 10 cycles Administration:  (02/22/2019),  (03/01/2019),  (03/15/2019),  (03/22/2019),  (04/06/2019),  (04/13/2019),  (04/27/2019),  (05/04/2019),  (05/18/2019),  (05/25/2019),  (06/16/2019),  (06/22/2019),  (07/13/2019),  (07/20/2019),  (08/10/2019),  (08/24/2019),  (09/07/2019),  (09/14/2019)  for chemotherapy treatment.       INTERVAL HISTORY:  Ms. Bechtel 65 y.o. female seen for follow-up of her cholangiocarcinoma.  She reported decrease in appetite and eating for the last couple of weeks.  Denies any nausea or vomiting.  Denies any abdominal pains.  Numbness in the feet has been stable.  Appetite is 25%.  Energy levels are 75%.  REVIEW OF SYSTEMS:  Review of Systems  Neurological: Positive for numbness.  All other systems reviewed and are negative.    PAST MEDICAL/SURGICAL HISTORY:  Past Medical History:  Diagnosis Date  . Anxiety   . Arthritis   . Depression   . Hypertension    Past Surgical History:  Procedure Laterality Date  . COLONOSCOPY N/A 10/23/2016   Procedure:  COLONOSCOPY;  Surgeon: Danie Binder, MD;  Location: [facility];  Service: Endoscopy;  Laterality: N/A;  11:30 Am  . POLYPECTOMY   10/23/2016   Procedure: POLYPECTOMY;  Surgeon: Danie Binder, MD;  Location: [facility];  Service: Endoscopy;;  sigmoid colon polyp  . PORTACATH PLACEMENT Left 02/16/2019   Procedure: INSERTION PORT-A-CATH (attached catheter in left subclavian);  Surgeon: Virl Cagey, MD;  Location: [facility];  Service: General;  Laterality: Left;  . RT HIP SURGERY    . TOTAL HIP ARTHROPLASTY Left 04/07/2016   Procedure: LEFT TOTAL HIP ARTHROPLASTY ANTERIOR APPROACH;  Surgeon: Paralee Cancel, MD;  Location: [facility];  Service: Orthopedics;  Laterality: Left;  Unsuccessful for Spinal, went to General     SOCIAL HISTORY:  Social History   Socioeconomic History  . Marital status: Single    Spouse name: Not on file  . Number of children: Not on file  . Years of education: Not on file  . Highest education level: Not on file  Occupational History  . Not on file  Tobacco Use  . Smoking status: Current Every Day Smoker    Packs/day: 0.25    Years: 42.00    Pack years: 10.50  . Smokeless tobacco: Never Used  Substance and Sexual Activity  . Alcohol use: No  . Drug use: No  . Sexual activity: Not Currently  Other Topics Concern  . Not on file  Social History Narrative  . Not on file   Social Determinants of Health   Financial Resource Strain:   . Difficulty of Paying Living Expenses: Not on file  Food Insecurity:   . Worried About Charity fundraiser in the Last Year: Not on file  . Ran Out of Food in the Last Year: Not on file  Transportation Needs:   . Lack of Transportation (Medical): Not on file  . Lack of Transportation (Non-Medical): Not on file  Physical Activity:   . Days of Exercise per Week: Not on file  . Minutes of Exercise per Session: Not on file  Stress:   . Feeling of Stress : Not on file  Social Connections:   . Frequency of Communication with Friends and Family: Not on file  . Frequency of Social Gatherings with Friends and Family: Not on file  . Attends Religious  Services: Not on file  . Active Member of Clubs or Organizations: Not on file  . Attends Archivist Meetings: Not on file  . Marital Status: Not on file  Intimate Partner Violence:   . Fear of Current or Ex-Partner: Not on file  . Emotionally Abused: Not on file  . Physically Abused: Not on file  . Sexually Abused: Not on file    FAMILY HISTORY:  Family History  Problem Relation Age of Onset  . Hypertension Mother   . Cancer Father   . Hypertension Brother   . Cancer Brother   . Hypertension Sister   . Cancer Sister     CURRENT MEDICATIONS:  Outpatient Encounter Medications as of 01/10/2020  Medication Sig  . amLODipine (NORVASC) 10 MG tablet Take 1 tablet (10 mg total) by mouth daily.  Marland Kitchen CISPLATIN IV Inject into the vein. Day 1, 8 every 21 days  . doxazosin (CARDURA) 2 MG tablet Take 2 mg by mouth every evening.   Marland Kitchen GEMCITABINE HCL IV Inject into the vein. Day 1, 8 every 21 days  . lidocaine (XYLOCAINE) 2 % solution Swish and swallow 1  tablespoon four times a day prn sore mouth (Patient not taking: Reported on 11/29/2019)  . lidocaine-prilocaine (EMLA) cream Apply to skin over port a cath one hour prior to chemotherapy appointment (Patient not taking: Reported on 11/29/2019)  . naproxen sodium (ALEVE) 220 MG tablet Take 220 mg by mouth 2 (two) times daily as needed.   . potassium chloride (KLOR-CON) 10 MEQ tablet TAKE 2 TABLETS (20 MEQ TOTAL) BY MOUTH 2 (TWO) TIMES DAILY.  Marland Kitchen prochlorperazine (COMPAZINE) 10 MG tablet TAKE 1 TABLET (10 MG TOTAL) BY MOUTH EVERY 6 (SIX) HOURS AS NEEDED (NAUSEA OR VOMITING). (Patient not taking: Reported on 11/29/2019)  . vitamin B-12 (CYANOCOBALAMIN) 50 MCG tablet Take 50 mcg by mouth daily.  . [DISCONTINUED] prochlorperazine (COMPAZINE) 10 MG tablet TAKE 1 TABLET (10 MG TOTAL) BY MOUTH EVERY 6 (SIX) HOURS AS NEEDED (NAUSEA OR VOMITING).   No facility-administered encounter medications on file as of 01/10/2020.    ALLERGIES:  No Known  Allergies   PHYSICAL EXAM:  ECOG Performance status: 1  Vitals:   01/10/20 1130  BP: (!) 144/68  Pulse: 77  Resp: 14  Temp: (!) 97.1 F (36.2 C)  SpO2: 100%   Filed Weights   01/10/20 1130  Weight: 205 lb 4.8 oz (93.1 kg)    Physical Exam Constitutional:      Appearance: Normal appearance. She is normal weight.  Cardiovascular:     Rate and Rhythm: Normal rate and regular rhythm.     Heart sounds: Normal heart sounds.  Pulmonary:     Effort: Pulmonary effort is normal.     Breath sounds: Normal breath sounds.  Abdominal:     General: Bowel sounds are normal.     Palpations: Abdomen is soft.  Musculoskeletal:        General: Normal range of motion.  Skin:    General: Skin is warm and dry.  Neurological:     Mental Status: She is alert and oriented to person, place, and time. Mental status is at baseline.  Psychiatric:        Mood and Affect: Mood normal.        Behavior: Behavior normal.        Thought Content: Thought content normal.        Judgment: Judgment normal.      LABORATORY DATA:  I have reviewed the labs as listed.  CBC    Component Value Date/Time   WBC 3.6 (L) 01/04/2020 1108   RBC 3.52 (L) 01/04/2020 1108   HGB 10.8 (L) 01/04/2020 1108   HCT 32.8 (L) 01/04/2020 1108   PLT 126 (L) 01/04/2020 1108   MCV 93.2 01/04/2020 1108   MCH 30.7 01/04/2020 1108   MCHC 32.9 01/04/2020 1108   RDW 14.7 01/04/2020 1108   LYMPHSABS 1.3 01/04/2020 1108   MONOABS 0.3 01/04/2020 1108   EOSABS 0.1 01/04/2020 1108   BASOSABS 0.0 01/04/2020 1108   CMP Latest Ref Rng & Units 01/10/2020 01/04/2020 11/29/2019  Glucose 70 - 99 mg/dL - 117(H) 112(H)  BUN 8 - 23 mg/dL - 9 18  Creatinine 0.44 - 1.00 mg/dL - 1.58(H) 1.62(H)  Sodium 135 - 145 mmol/L - 138 141  Potassium 3.5 - 5.1 mmol/L - 3.0(L) 3.9  Chloride 98 - 111 mmol/L - 100 105  CO2 22 - 32 mmol/L - 27 25  Calcium 8.9 - 10.3 mg/dL - 9.4 9.5  Total Protein 6.5 - 8.1 g/dL 7.3 7.5 7.2  Total Bilirubin 0.3 -  1.2 mg/dL 0.7  4.6(H) 0.6  Alkaline Phos 38 - 126 U/L 38 496(H) 92  AST 15 - 41 U/L 22 111(H) 16  ALT 0 - 44 U/L 28 96(H) 9    I have independently reviewed scans and discussed with the patient.   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  Metastatic cholangiocarcinoma: -Foundation 1 CDX MS-stable, no other actionable mutations. -9 cycles of gemcitabine and cisplatin from 02/22/2019 through 09/07/2019. -Last CT scan on 07/31/2019 showed improvement in liver metastasis after cycle 7. -CEA on 09/07/2019 was 8.7. -CT CAP on 10/17/2019 showed gallbladder fossa mass measures 1.8 x 2.3 cm, previously 2.2 x 2.8 cm.  Additional stable 12 mm hypoenhancing lesion inferiorly in segment 5.  There is a 9.4 x 10.5 cm right ovarian cystic lesion which is new.  This is questionable for ovarian neoplasm. -Guardant 360 on 11/01/2019 showed MSI high not detected, FGFR1 V248M variant of unknown significance. NOTCH1 P2122P synonymous alteration, unlikely to be a therapeutic target. -We reviewed results of the CT CAP from 01/04/2020.  Enlarging septated cystic mass arising from the adnexa, measuring 279 0 cm in volume, previously 420 cm on 10/17/2019.  Large mass displaces surrounding bowel loops.  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  Stable porta hepatic adenopathy.  Mass along the gallbladder fossa is difficult to measure based on orientation.  Coronal plane measures 2.9 x 2.7 cm, previously 2.7 x 2.5 cm.  Mild but increased intrahepatic biliary dilatation.  Several small pulmonary nodules are stable. -Her total bilirubin last week was elevated at 4.6.  Hence I have recommended MRCP. -We have repeated LFTs today which showed normal total bilirubin.  We will cancel the MRCP. 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  We will check CA-125. -Her CEA has been steadily going up, 14 on 12/27/2019. -We will likely start her chemotherapy back.  I will also consider biopsy for  foundation 1 testing.  2.  Hypokalemia: -Potassium on last week labs was low at 3.0.  She reportedly did not take potassium prior to blood testing.  She is taking 20 mg twice daily.  3.  Health maintenance: -Mammogram on 07/31/2019 was BI-RADS Category 1.  4.  Peripheral neuropathy: -She has some numbness in the feet with no pins-and-needles sensation.  5.  Folic acid deficiency: -Folic acid was low.  She was prescribed folic acid 1 mg daily.   Orders placed this encounter:  Orders Placed This Encounter  Procedures  . MR ABDOMEN MRCP WO CONTRAST  . Hepatic function panel      Derek Jack, MD [place] [phone]

## 2020-01-17 ENCOUNTER — Other Ambulatory Visit: Payer: Self-pay

## 2020-01-17 ENCOUNTER — Inpatient Hospital Stay (HOSPITAL_COMMUNITY): Payer: Medicare Other

## 2020-01-17 ENCOUNTER — Inpatient Hospital Stay (HOSPITAL_BASED_OUTPATIENT_CLINIC_OR_DEPARTMENT_OTHER): Payer: Medicare Other | Admitting: Hematology

## 2020-01-17 ENCOUNTER — Encounter (HOSPITAL_COMMUNITY): Payer: Self-pay | Admitting: Hematology

## 2020-01-17 VITALS — BP 167/69 | HR 78 | Temp 97.1°F | Resp 20 | Wt 202.9 lb

## 2020-01-17 DIAGNOSIS — C787 Secondary malignant neoplasm of liver and intrahepatic bile duct: Secondary | ICD-10-CM

## 2020-01-17 DIAGNOSIS — R17 Unspecified jaundice: Secondary | ICD-10-CM | POA: Diagnosis not present

## 2020-01-17 DIAGNOSIS — C221 Intrahepatic bile duct carcinoma: Secondary | ICD-10-CM

## 2020-01-17 DIAGNOSIS — C240 Malignant neoplasm of extrahepatic bile duct: Secondary | ICD-10-CM | POA: Diagnosis not present

## 2020-01-17 LAB — CBC WITH DIFFERENTIAL/PLATELET
Abs Immature Granulocytes: 0.02 10*3/uL (ref 0.00–0.07)
Basophils Absolute: 0 10*3/uL (ref 0.0–0.1)
Basophils Relative: 0 %
Eosinophils Absolute: 0.1 10*3/uL (ref 0.0–0.5)
Eosinophils Relative: 1 %
HCT: 30.7 % — ABNORMAL LOW (ref 36.0–46.0)
Hemoglobin: 10.3 g/dL — ABNORMAL LOW (ref 12.0–15.0)
Immature Granulocytes: 0 %
Lymphocytes Relative: 27 %
Lymphs Abs: 1.4 10*3/uL (ref 0.7–4.0)
MCH: 31.1 pg (ref 26.0–34.0)
MCHC: 33.6 g/dL (ref 30.0–36.0)
MCV: 92.7 fL (ref 80.0–100.0)
Monocytes Absolute: 0.5 10*3/uL (ref 0.1–1.0)
Monocytes Relative: 9 %
Neutro Abs: 3.2 10*3/uL (ref 1.7–7.7)
Neutrophils Relative %: 63 %
Platelets: 179 10*3/uL (ref 150–400)
RBC: 3.31 MIL/uL — ABNORMAL LOW (ref 3.87–5.11)
RDW: 18.7 % — ABNORMAL HIGH (ref 11.5–15.5)
WBC: 5.2 10*3/uL (ref 4.0–10.5)
nRBC: 0 % (ref 0.0–0.2)

## 2020-01-17 LAB — COMPREHENSIVE METABOLIC PANEL
ALT: 53 U/L — ABNORMAL HIGH (ref 0–44)
AST: 55 U/L — ABNORMAL HIGH (ref 15–41)
Albumin: 3.6 g/dL (ref 3.5–5.0)
Alkaline Phosphatase: 387 U/L — ABNORMAL HIGH (ref 38–126)
Anion gap: 12 (ref 5–15)
BUN: 14 mg/dL (ref 8–23)
CO2: 22 mmol/L (ref 22–32)
Calcium: 9.6 mg/dL (ref 8.9–10.3)
Chloride: 105 mmol/L (ref 98–111)
Creatinine, Ser: 1.59 mg/dL — ABNORMAL HIGH (ref 0.44–1.00)
GFR calc Af Amer: 39 mL/min — ABNORMAL LOW (ref >60)
GFR calc non Af Amer: 34 mL/min — ABNORMAL LOW (ref >60)
Glucose, Bld: 106 mg/dL — ABNORMAL HIGH (ref 70–99)
Potassium: 3.9 mmol/L (ref 3.5–5.1)
Sodium: 139 mmol/L (ref 135–145)
Total Bilirubin: 12.4 mg/dL — ABNORMAL HIGH (ref 0.3–1.2)
Total Protein: 7.4 g/dL (ref 6.5–8.1)

## 2020-01-17 LAB — BILIRUBIN, DIRECT: Bilirubin, Direct: 7.8 mg/dL — ABNORMAL HIGH (ref 0.0–0.2)

## 2020-01-17 NOTE — Assessment & Plan Note (Signed)
1.  Metastatic cholangiocarcinoma to the liver: -9 cycles of gemcitabine and cisplatin from 02/22/2019 through 09/07/2019 -CT CAP on 01/04/2020 showed an large septated cystic mass arising from the adnexa, measuring 2790 cm3 in volume, previously 420 cm3 on 10/17/2019.  Large mass displaces surrounding bowel loops.  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  Stable porta hepatic adenopathy.  Mass along the gallbladder fossa is difficult to measure based on orientation.  Coronal plane measures 2.9 x 2.7 cm previously 2.7 x 2.5 cm.  Mild but increased intrahepatic biliary dilatation.  Several small pulmonary nodules stable. -Total bilirubin found elevated at 4.6 on 01/04/2020.  However it was normal on 01/10/2020. -Labs today showed total bilirubin 12.4.  Direct bilirubin is 7.8.  AST and ALT are elevated but improved from previous labs.  Last CEA was 14 on 12/27/2019. -She complains of losing appetite.  Eyes are icteric.  She had 1 or 2 episodes of vomiting. -I have recommended MRCP to see if there is an obstruction as soon as possible.  We will make a referral to Dr. Laural Golden for ERCP and stent placement.  We will also try to obtain biopsy if it is feasible.  2.  Peripheral neuropathy: -She has some numbness in the feet from prior cisplatin exposure.  No neuropathic pains.  3.  Folic acid deficiency: -She will continue folic acid 1 mg daily.

## 2020-01-17 NOTE — Patient Instructions (Addendum)
[place] at [facility] Discharge Instructions  You were seen today by Dr. Delton Coombes. He went over your recent lab results. He will refer you to a GI doctor. He will schedule you for a MRCP. He will see you back after your scan for follow up.   Thank you for choosing [place] at [facility] to provide your oncology and hematology care.  To afford each patient quality time with our provider, please arrive at least 15 minutes before your scheduled appointment time.   If you have a lab appointment with the [facility] please come in thru the  Main Entrance and check in at the main information desk  You need to re-schedule your appointment should you arrive 10 or more minutes late.  We strive to give you quality time with our providers, and arriving late affects you and other patients whose appointments are after yours.  Also, if you no show three or more times for appointments you may be dismissed from the clinic at the providers discretion.     Again, thank you for choosing [facility].  Our hope is that these requests will decrease the amount of time that you wait before being seen by our physicians.       _____________________________________________________________  Should you have questions after your visit to [facility], please contact our office at [phone] between the hours of 8:00 a.m. and 4:30 p.m.  Voicemails left after 4:00 p.m. will not be returned until the following business day.  For prescription refill requests, have your pharmacy contact our office and allow 72 hours.    Cancer Center Support Programs:   > Cancer Support Group  2nd Tuesday of the month 1pm-2pm, Journey Room

## 2020-01-17 NOTE — Progress Notes (Signed)
[facility] [place], [place] [address]   CLINIC:  Medical Oncology/Hematology  PCP:  Cory Munch, PA-C [place] [address] [phone]   REASON FOR VISIT: Follow-up for cholangiocarcinoma to the liver.  CURRENT THERAPY: Observation.  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[place])   02/22/2019 -  Chemotherapy   The patient had palonosetron (ALOXI) injection 0.25 mg, 0.25 mg, Intravenous,  Once, 10 of 10 cycles Administration: 0.25 mg (02/22/2019), 0.25 mg (03/01/2019), 0.25 mg (03/15/2019), 0.25 mg (03/22/2019), 0.25 mg (04/06/2019), 0.25 mg (04/13/2019), 0.25 mg (04/27/2019), 0.25 mg (05/04/2019), 0.25 mg (05/18/2019), 0.25 mg (05/25/2019), 0.25 mg (06/16/2019), 0.25 mg (06/22/2019), 0.25 mg (07/13/2019), 0.25 mg (07/20/2019), 0.25 mg (08/10/2019), 0.25 mg (08/24/2019), 0.25 mg (09/07/2019), 0.25 mg (09/14/2019) pegfilgrastim-cbqv (UDENYCA) injection 6 mg, 6 mg, Subcutaneous, Once, 10 of 10 cycles Administration: 6 mg (03/02/2019), 6 mg (03/24/2019), 6 mg (05/05/2019), 6 mg (05/26/2019), 6 mg (06/23/2019), 6 mg (07/21/2019), 6 mg (08/25/2019), 6 mg (09/15/2019) CISplatin (PLATINOL) 52 mg in sodium chloride 0.9 % 250 mL chemo infusion, 25 mg/m2 = 52 mg, Intravenous,  Once, 10 of 10 cycles Dose modification: 20 mg/m2 (80 % of original dose 25 mg/m2, Cycle 9, Reason: Other (see comments), Comment: renal insufficiency) Administration: 52 mg (02/22/2019), 52 mg (03/01/2019), 52 mg (03/15/2019), 52 mg (03/22/2019), 52 mg (04/06/2019), 52 mg (04/13/2019), 52 mg (04/27/2019), 52 mg (05/04/2019), 52 mg (05/18/2019), 52 mg (05/25/2019), 52 mg (06/16/2019), 52 mg (06/22/2019), 52 mg (07/13/2019), 52 mg (07/20/2019), 52 mg (08/10/2019), 52 mg (08/24/2019), 42 mg (09/07/2019), 42 mg (09/14/2019) gemcitabine (GEMZAR) 2,000 mg in sodium chloride 0.9 % 250 mL chemo infusion,  2,090 mg, Intravenous,  Once, 10 of 10 cycles Dose modification: 750 mg/m2 (75 % of original dose 1,000 mg/m2, Cycle 4, Reason: Other (see comments), Comment: neutropenia), 750 mg/m2 (75 % of original dose 1,000 mg/m2, Cycle 7, Reason: Other (see comments), Comment: neutripenia) Administration: 2,000 mg (02/22/2019), 2,000 mg (03/01/2019), 2,000 mg (03/15/2019), 2,000 mg (03/22/2019), 2,000 mg (04/06/2019), 2,000 mg (04/13/2019), 2,000 mg (04/27/2019), 1,558 mg (05/04/2019), 2,000 mg (05/18/2019), 2,000 mg (05/25/2019), 2,000 mg (06/16/2019), 2,000 mg (06/22/2019), 2,000 mg (07/13/2019), 1,558 mg (07/20/2019), 2,000 mg (08/10/2019), 2,000 mg (08/24/2019), 2,000 mg (09/07/2019), 2,000 mg (09/14/2019) fosaprepitant (EMEND) 150 mg, dexamethasone (DECADRON) 12 mg in sodium chloride 0.9 % 145 mL IVPB, , Intravenous,  Once, 10 of 10 cycles Administration:  (02/22/2019),  (03/01/2019),  (03/15/2019),  (03/22/2019),  (04/06/2019),  (04/13/2019),  (04/27/2019),  (05/04/2019),  (05/18/2019),  (05/25/2019),  (06/16/2019),  (06/22/2019),  (07/13/2019),  (07/20/2019),  (08/10/2019),  (08/24/2019),  (09/07/2019),  (09/14/2019)  for chemotherapy treatment.       INTERVAL HISTORY:  Ms. Kast 64 y.o. female seen for follow-up of her cholangiocarcinoma.  She has been off of treatment since October.  Appetite and energy levels are 50%.  She reported decrease in appetite in the last few days.  She also had episodes of vomiting yesterday and today.  Had some nausea for last couple of days.  Numbness in the feet has been stable.  REVIEW OF SYSTEMS:  Review of Systems  Gastrointestinal: Positive for nausea.  Neurological: Positive for numbness.  All other systems reviewed and are negative.    PAST MEDICAL/SURGICAL HISTORY:  Past Medical History:  Diagnosis Date  . Anxiety   . Arthritis   . Depression   . Hypertension  Past Surgical History:  Procedure Laterality Date  . COLONOSCOPY N/A 10/23/2016   Procedure: COLONOSCOPY;  Surgeon: Danie Binder,  MD;  [facility];  Service: Endoscopy;  Laterality: N/A;  11:30 Am  . POLYPECTOMY  10/23/2016   Procedure: POLYPECTOMY;  Surgeon: Danie Binder, MD;  Location: [facility];  Service: Endoscopy;;  sigmoid colon polyp  . PORTACATH PLACEMENT Left 02/16/2019   Procedure: INSERTION PORT-A-CATH (attached catheter in left subclavian);  Surgeon: Virl Cagey, MD;  Location: [facility];  Service: General;  Laterality: Left;  . RT HIP SURGERY    . TOTAL HIP ARTHROPLASTY Left 04/07/2016   Procedure: LEFT TOTAL HIP ARTHROPLASTY ANTERIOR APPROACH;  Surgeon: Paralee Cancel, MD;  Location: [facility];  Service: Orthopedics;  Laterality: Left;  Unsuccessful for Spinal, went to General     SOCIAL HISTORY:  Social History   Socioeconomic History  . Marital status: Single    Spouse name: Not on file  . Number of children: Not on file  . Years of education: Not on file  . Highest education level: Not on file  Occupational History  . Not on file  Tobacco Use  . Smoking status: Current Every Day Smoker    Packs/day: 0.25    Years: 42.00    Pack years: 10.50  . Smokeless tobacco: Never Used  Substance and Sexual Activity  . Alcohol use: No  . Drug use: No  . Sexual activity: Not Currently  Other Topics Concern  . Not on file  Social History Narrative  . Not on file   Social Determinants of Health   Financial Resource Strain:   . Difficulty of Paying Living Expenses: Not on file  Food Insecurity:   . Worried About Charity fundraiser in the Last Year: Not on file  . Ran Out of Food in the Last Year: Not on file  Transportation Needs:   . Lack of Transportation (Medical): Not on file  . Lack of Transportation (Non-Medical): Not on file  Physical Activity:   . Days of Exercise per Week: Not on file  . Minutes of Exercise per Session: Not on file  Stress:   . Feeling of Stress : Not on file  Social Connections:   . Frequency of Communication with Friends and Family: Not on file  .  Frequency of Social Gatherings with Friends and Family: Not on file  . Attends Religious Services: Not on file  . Active Member of Clubs or Organizations: Not on file  . Attends Archivist Meetings: Not on file  . Marital Status: Not on file  Intimate Partner Violence:   . Fear of Current or Ex-Partner: Not on file  . Emotionally Abused: Not on file  . Physically Abused: Not on file  . Sexually Abused: Not on file    FAMILY HISTORY:  Family History  Problem Relation Age of Onset  . Hypertension Mother   . Cancer Father   . Hypertension Brother   . Cancer Brother   . Hypertension Sister   . Cancer Sister     CURRENT MEDICATIONS:  Outpatient Encounter Medications as of 01/17/2020  Medication Sig  . amLODipine (NORVASC) 10 MG tablet Take 1 tablet (10 mg total) by mouth daily.  Marland Kitchen CISPLATIN IV Inject into the vein. Day 1, 8 every 21 days  . doxazosin (CARDURA) 2 MG tablet Take 2 mg by mouth every evening.   Marland Kitchen GEMCITABINE HCL IV Inject into the vein. Day 1,  8 every 21 days  . potassium chloride (KLOR-CON) 10 MEQ tablet TAKE 2 TABLETS (20 MEQ TOTAL) BY MOUTH 2 (TWO) TIMES DAILY.  . vitamin B-12 (CYANOCOBALAMIN) 50 MCG tablet Take 50 mcg by mouth daily.  Marland Kitchen lidocaine (XYLOCAINE) 2 % solution Swish and swallow 1 tablespoon four times a day prn sore mouth (Patient not taking: Reported on 11/29/2019)  . lidocaine-prilocaine (EMLA) cream Apply to skin over port a cath one hour prior to chemotherapy appointment (Patient not taking: Reported on 11/29/2019)  . naproxen sodium (ALEVE) 220 MG tablet Take 220 mg by mouth 2 (two) times daily as needed.   . prochlorperazine (COMPAZINE) 10 MG tablet TAKE 1 TABLET (10 MG TOTAL) BY MOUTH EVERY 6 (SIX) HOURS AS NEEDED (NAUSEA OR VOMITING). (Patient not taking: Reported on 11/29/2019)  . [DISCONTINUED] prochlorperazine (COMPAZINE) 10 MG tablet TAKE 1 TABLET (10 MG TOTAL) BY MOUTH EVERY 6 (SIX) HOURS AS NEEDED (NAUSEA OR VOMITING).   No  facility-administered encounter medications on file as of 01/17/2020.    ALLERGIES:  No Known Allergies   PHYSICAL EXAM:  ECOG Performance status: 1  Vitals:   01/17/20 1526  BP: (!) 167/69  Pulse: 78  Resp: 20  Temp: (!) 97.1 F (36.2 C)  SpO2: 100%   Filed Weights   01/17/20 1526  Weight: 202 lb 14.4 oz (92 kg)    Physical Exam Constitutional:      Appearance: Normal appearance. She is normal weight.  Cardiovascular:     Rate and Rhythm: Normal rate and regular rhythm.     Heart sounds: Normal heart sounds.  Pulmonary:     Effort: Pulmonary effort is normal.     Breath sounds: Normal breath sounds.  Abdominal:     General: Bowel sounds are normal.     Palpations: Abdomen is soft.  Musculoskeletal:        General: Normal range of motion.  Skin:    General: Skin is warm and dry.  Neurological:     Mental Status: She is alert and oriented to person, place, and time. Mental status is at baseline.  Psychiatric:        Mood and Affect: Mood normal.        Behavior: Behavior normal.        Thought Content: Thought content normal.        Judgment: Judgment normal.      LABORATORY DATA:  I have reviewed the labs as listed.  CBC    Component Value Date/Time   WBC 5.2 01/17/2020 1449   RBC 3.31 (L) 01/17/2020 1449   HGB 10.3 (L) 01/17/2020 1449   HCT 30.7 (L) 01/17/2020 1449   PLT 179 01/17/2020 1449   MCV 92.7 01/17/2020 1449   MCH 31.1 01/17/2020 1449   MCHC 33.6 01/17/2020 1449   RDW 18.7 (H) 01/17/2020 1449   LYMPHSABS 1.4 01/17/2020 1449   MONOABS 0.5 01/17/2020 1449   EOSABS 0.1 01/17/2020 1449   BASOSABS 0.0 01/17/2020 1449   CMP Latest Ref Rng & Units 01/17/2020 01/10/2020 01/04/2020  Glucose 70 - 99 mg/dL 106(H) - 117(H)  BUN 8 - 23 mg/dL 14 - 9  Creatinine 0.44 - 1.00 mg/dL 1.59(H) - 1.58(H)  Sodium 135 - 145 mmol/L 139 - 138  Potassium 3.5 - 5.1 mmol/L 3.9 - 3.0(L)  Chloride 98 - 111 mmol/L 105 - 100  CO2 22 - 32 mmol/L 22 - 27  Calcium 8.9 -  10.3 mg/dL 9.6 - 9.4  Total Protein 6.5 -  8.1 g/dL 7.4 7.3 7.5  Total Bilirubin 0.3 - 1.2 mg/dL 12.4(H) 0.7 4.6(H)  Alkaline Phos 38 - 126 U/L 387(H) 38 496(H)  AST 15 - 41 U/L 55(H) 22 111(H)  ALT 0 - 44 U/L 53(H) 28 96(H)    I have independently reviewed scans and discussed with the patient.   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  Metastatic cholangiocarcinoma to the liver: -9 cycles of gemcitabine and cisplatin from 02/22/2019 through 09/07/2019 -CT CAP on 01/04/2020 showed an large septated cystic mass arising from the adnexa, measuring 2790 cm3 in volume, previously 420 cm3 on 10/17/2019.  Large mass displaces surrounding bowel loops.  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  Stable porta hepatic adenopathy.  Mass along the gallbladder fossa is difficult to measure based on orientation.  Coronal plane measures 2.9 x 2.7 cm previously 2.7 x 2.5 cm.  Mild but increased intrahepatic biliary dilatation.  Several small pulmonary nodules stable. -Total bilirubin found elevated at 4.6 on 01/04/2020.  However it was normal on 01/10/2020. -Labs today showed total bilirubin 12.4.  Direct bilirubin is 7.8.  AST and ALT are elevated but improved from previous labs.  Last CEA was 14 on 12/27/2019. -She complains of losing appetite.  Eyes are icteric.  She had 1 or 2 episodes of vomiting. -I have recommended MRCP to see if there is an obstruction as soon as possible.  We will make a referral to Dr. Laural Golden for ERCP and stent placement.  We will also try to obtain biopsy if it is feasible.  2.  Peripheral neuropathy: -She has some numbness in the feet from prior cisplatin exposure.  No neuropathic pains.  3.  Folic acid deficiency: -She will continue folic acid 1 mg daily.   Orders placed this encounter:  Orders Placed This Encounter  Procedures  . MR ABDOMEN WITH MRCP W CONTRAST  . Bilirubin, direct      Derek Jack, MD [place] [phone]

## 2020-01-18 ENCOUNTER — Telehealth (HOSPITAL_COMMUNITY): Payer: Self-pay | Admitting: Surgery

## 2020-01-18 ENCOUNTER — Other Ambulatory Visit (HOSPITAL_COMMUNITY): Payer: Self-pay | Admitting: Hematology

## 2020-01-18 ENCOUNTER — Ambulatory Visit (HOSPITAL_COMMUNITY)
Admission: RE | Admit: 2020-01-18 | Discharge: 2020-01-18 | Disposition: A | Discharge status: Home / Self Care | Payer: Medicare Other | Source: Ambulatory Visit | Attending: Hematology | Admitting: Hematology

## 2020-01-18 ENCOUNTER — Encounter (HOSPITAL_COMMUNITY): Payer: Self-pay | Admitting: *Deleted

## 2020-01-18 ENCOUNTER — Encounter (HOSPITAL_COMMUNITY): Payer: Self-pay

## 2020-01-18 DIAGNOSIS — R17 Unspecified jaundice: Secondary | ICD-10-CM

## 2020-01-18 DIAGNOSIS — C787 Secondary malignant neoplasm of liver and intrahepatic bile duct: Secondary | ICD-10-CM

## 2020-01-18 DIAGNOSIS — C221 Intrahepatic bile duct carcinoma: Secondary | ICD-10-CM

## 2020-01-18 NOTE — Progress Notes (Signed)
Pt came to [facility] for  STAT mri Abd MRCP. Pt was unable to get into bore of scanner, pt immediatly requested to come out. Pt states she is unable to proceed with test. Pt was given option to take a a min to regroup and attempt again. Office called and informed of this. Pt educated on process of getting MRI rescheduled with meds if pt will attempt.

## 2020-01-18 NOTE — Progress Notes (Signed)
We received notification that patient could not do the MRCP today.  Orders received from Dr. Delton Coombes to access patient's port and administer ativan 1 mg via port.  Flush port per protocol and deaccess.  This can be done 30 minutes prior to MRI.    I spoke with patient and she verbalizes understanding and willingness to try this route.    I have talked with MRI department at [facility] and they do not have any appointments for tomorrow but advised for me to call in the morning and if their inpatient spots have not been taken, they could give [place] one of those.

## 2020-01-18 NOTE — Telephone Encounter (Signed)
Marjory Lies from MRI at [facility] called to tell Dr. Raliegh Ip that this pt was too scared to do the MRCP.  Marjory Lies wanted to let us know to see what the next steps were for the pt.  Dr. Raliegh Ip and Caryl Pina and notified.

## 2020-01-19 ENCOUNTER — Other Ambulatory Visit (HOSPITAL_COMMUNITY): Payer: Self-pay | Admitting: *Deleted

## 2020-01-19 ENCOUNTER — Other Ambulatory Visit: Payer: Self-pay

## 2020-01-19 ENCOUNTER — Encounter (HOSPITAL_COMMUNITY): Payer: Self-pay | Admitting: *Deleted

## 2020-01-19 ENCOUNTER — Other Ambulatory Visit: Payer: Self-pay | Admitting: Hematology

## 2020-01-19 ENCOUNTER — Ambulatory Visit
Admission: RE | Admit: 2020-01-19 | Discharge: 2020-01-19 | Disposition: A | Discharge status: Home / Self Care | Payer: Medicare Other | Source: Ambulatory Visit | Attending: Hematology | Admitting: Hematology

## 2020-01-19 DIAGNOSIS — C221 Intrahepatic bile duct carcinoma: Secondary | ICD-10-CM | POA: Insufficient documentation

## 2020-01-19 DIAGNOSIS — C787 Secondary malignant neoplasm of liver and intrahepatic bile duct: Secondary | ICD-10-CM | POA: Diagnosis present

## 2020-01-19 DIAGNOSIS — R935 Abnormal findings on diagnostic imaging of other abdominal regions, including retroperitoneum: Secondary | ICD-10-CM | POA: Insufficient documentation

## 2020-01-19 DIAGNOSIS — R17 Unspecified jaundice: Secondary | ICD-10-CM | POA: Diagnosis present

## 2020-01-19 MED ORDER — DIAZEPAM 5 MG PO TABS: ORAL_TABLET | ORAL | 0 refills | Status: DC

## 2020-01-19 MED ORDER — GADOBUTROL 1 MMOL/ML IV SOLN
10.0000 mL | Freq: Once | INTRAVENOUS | Status: AC | PRN
  Administered 2020-01-19: 10 mL via INTRAVENOUS

## 2020-01-19 MED ORDER — GADOBUTROL 1 MMOL/ML IV SOLN: 10.0000 mL | Freq: Once | INTRAVENOUS | Status: DC | PRN

## 2020-01-22 ENCOUNTER — Encounter (INDEPENDENT_AMBULATORY_CARE_PROVIDER_SITE_OTHER): Payer: Self-pay | Admitting: *Deleted

## 2020-01-22 ENCOUNTER — Encounter (HOSPITAL_COMMUNITY): Payer: Self-pay | Admitting: Hematology

## 2020-01-22 ENCOUNTER — Other Ambulatory Visit (INDEPENDENT_AMBULATORY_CARE_PROVIDER_SITE_OTHER): Payer: Self-pay | Admitting: *Deleted

## 2020-01-22 ENCOUNTER — Encounter (HOSPITAL_COMMUNITY): Payer: Self-pay | Admitting: *Deleted

## 2020-01-22 ENCOUNTER — Other Ambulatory Visit: Payer: Self-pay

## 2020-01-22 ENCOUNTER — Encounter (INDEPENDENT_AMBULATORY_CARE_PROVIDER_SITE_OTHER): Payer: Self-pay | Admitting: Internal Medicine

## 2020-01-22 ENCOUNTER — Ambulatory Visit (INDEPENDENT_AMBULATORY_CARE_PROVIDER_SITE_OTHER): Payer: Medicare Other | Admitting: Internal Medicine

## 2020-01-22 ENCOUNTER — Inpatient Hospital Stay (HOSPITAL_BASED_OUTPATIENT_CLINIC_OR_DEPARTMENT_OTHER): Payer: Medicare Other | Admitting: Hematology

## 2020-01-22 ENCOUNTER — Inpatient Hospital Stay (HOSPITAL_COMMUNITY): Payer: Medicare Other

## 2020-01-22 VITALS — BP 148/68 | HR 75 | Temp 97.5°F | Resp 18 | Wt 201.4 lb

## 2020-01-22 VITALS — BP 150/76 | HR 69 | Temp 97.1°F | Ht 64.0 in | Wt 199.2 lb

## 2020-01-22 DIAGNOSIS — C787 Secondary malignant neoplasm of liver and intrahepatic bile duct: Secondary | ICD-10-CM

## 2020-01-22 DIAGNOSIS — N83209 Unspecified ovarian cyst, unspecified side: Secondary | ICD-10-CM

## 2020-01-22 DIAGNOSIS — C221 Intrahepatic bile duct carcinoma: Secondary | ICD-10-CM

## 2020-01-22 DIAGNOSIS — K831 Obstruction of bile duct: Secondary | ICD-10-CM

## 2020-01-22 DIAGNOSIS — C801 Malignant (primary) neoplasm, unspecified: Secondary | ICD-10-CM

## 2020-01-22 DIAGNOSIS — C240 Malignant neoplasm of extrahepatic bile duct: Secondary | ICD-10-CM | POA: Diagnosis not present

## 2020-01-22 LAB — HEPATIC FUNCTION PANEL
ALT: 51 U/L — ABNORMAL HIGH (ref 0–44)
AST: 58 U/L — ABNORMAL HIGH (ref 15–41)
Albumin: 3.3 g/dL — ABNORMAL LOW (ref 3.5–5.0)
Alkaline Phosphatase: 398 U/L — ABNORMAL HIGH (ref 38–126)
Bilirubin, Direct: 10.9 mg/dL — ABNORMAL HIGH (ref 0.0–0.2)
Indirect Bilirubin: 6.9 mg/dL — ABNORMAL HIGH (ref 0.3–0.9)
Total Bilirubin: 17.8 mg/dL — ABNORMAL HIGH (ref 0.3–1.2)
Total Protein: 7.6 g/dL (ref 6.5–8.1)

## 2020-01-22 NOTE — Progress Notes (Signed)
I spoke with Dr. Laural Golden and he wants patient to come to the cancer center on Tuesday for Vitamin K 10mg  sq injection x 1 after her lab appointment.    Per Dr. Delton Coombes, okay to give per Rehman's orders.  Pharmacy aware.

## 2020-01-22 NOTE — Progress Notes (Signed)
Reason for consultation  Jaundice in a patient with history of metastatic cholangiocarcinoma.  History of present illness  Patient is 64 year old African-American female who is referred through courtesy of Dr. Delton Coombes for therapeutic ERCP. Patient was diagnosed with metastatic cholangiocarcinoma in February 2020.  1 month earlier she had presented to emergency room with right upper quadrant abdominal pain abdominal pelvic CT had revealed 1.3 cm hypodense lesion adjacent to the gallbladder consistent with metastatic disease.  In addition there was diffuse thickening and haziness of gallbladder with soft tissue mass extending to segment IVB/V.  She subsequently had biopsy of liver lesion confirming it to be an adenocarcinoma possibly pancreatic biliary origin.  PET scan had confirmed 2 foci of hepatic hypermetabolism as well as porta hepatis hypermetabolic adenopathy indicative of metastatic disease. Patient received 9 cycles of gemcitabine and cis-platinum from 02/22/2019 through 09/07/2019.  She developed numbness in both feet. Abdominal pelvic CT November 2020 revealed gallbladder fossa mass as well as hypoenhancing liver lesion in segment 5 and a new finding of large right ovarian cyst concerning for malignancy.  She had normal LFTs on 11/29/2019 Blood work on 01/04/2020 revealed bilirubin of 4.6 with elevated alkaline phosphatase AST and ALT.  She therefore had chest and abdominal pelvic CT revealing ill-defined mass in gallbladder fossa mild intrahepatic biliary dilation concerning for obstruction due to tumor the region of porta hepatis/common hepatic duct.  She also had several tiny pulmonary nodules multiple uterine fibroids and enlarging septated cystic mass arising from right ovary concerning for neoplasm. Patient had MRCP on 11/26/2020 revealing 1.9 x 1.2 cm soft tissue lesion obliterating common hepatic duct just below the hilum.  The soft tissue density felt to be involving the gallbladder  wall extending posteriorly to the neck of the gallbladder.  Lymphadenopathy in hepatoduodenal ligament as before.  Patient's bilirubin last week was 12 and from this morning is jumped to 17.8 with direct of 10.9.  Patient states that she was doing fine until few weeks ago when she developed nausea vomiting and very dark-colored urine.  About 2 weeks ago or so she herself noted her eyes to be yellow.  She has had poor appetite.  She has lost 8 pounds since the symptoms started.  She denies abdominal pain fever or chills.  She has noted abdominal distention.  She denies melena or rectal bleeding or acholic stools.  She also denies urinary or vaginal symptoms. She also has history of folate deficiency and her hemoglobin has improved with folic acid supplementation. She is up-to-date on screening for CRC.  Last colonoscopy was in November 2017 by Dr. Lovey Newcomer feels revealing internal hemorrhoids and 6 mm tubular adenoma in sigmoid colon.    Current Medications: Outpatient Encounter Medications as of 01/22/2020  Medication Sig  . amLODipine (NORVASC) 10 MG tablet Take 1 tablet (10 mg total) by mouth daily.  Marland Kitchen CISPLATIN IV Inject into the vein. Day 1, 8 every 21 days  . doxazosin (CARDURA) 2 MG tablet Take 2 mg by mouth every evening.   Marland Kitchen GEMCITABINE HCL IV Inject into the vein. Day 1, 8 every 21 days  . lidocaine (XYLOCAINE) 2 % solution Swish and swallow 1 tablespoon four times a day prn sore mouth  . lidocaine-prilocaine (EMLA) cream Apply to skin over port a cath one hour prior to chemotherapy appointment  . naproxen sodium (ALEVE) 220 MG tablet Take 220 mg by mouth 2 (two) times daily as needed.   . potassium chloride (KLOR-CON) 10 MEQ tablet TAKE 2 TABLETS (  20 MEQ TOTAL) BY MOUTH 2 (TWO) TIMES DAILY.  Marland Kitchen prochlorperazine (COMPAZINE) 10 MG tablet TAKE 1 TABLET (10 MG TOTAL) BY MOUTH EVERY 6 (SIX) HOURS AS NEEDED (NAUSEA OR VOMITING).  . vitamin B-12 (CYANOCOBALAMIN) 50 MCG tablet Take 50 mcg by mouth  daily.  . diazepam (VALIUM) 5 MG tablet Take 1 tablet at 1pm today, take 1 tablet at 145 pm today, may take 1 tablet just prior to exam if still needed today. (Patient not taking: Reported on 01/22/2020)  . [DISCONTINUED] prochlorperazine (COMPAZINE) 10 MG tablet TAKE 1 TABLET (10 MG TOTAL) BY MOUTH EVERY 6 (SIX) HOURS AS NEEDED (NAUSEA OR VOMITING).   No facility-administered encounter medications on file as of 01/22/2020.   Past Medical History:  Diagnosis Date  . Anxiety   . Arthritis   . Depression   . Hypertension       History of metastatic cholangiocarcinoma diagnosed in February 2020.     History of folate deficiency.  Past Surgical History:  Procedure Laterality Date  . COLONOSCOPY N/A 10/23/2016   Procedure: COLONOSCOPY;  Surgeon: Danie Binder, MD;  [facility];  Service: Endoscopy;  Laterality: N/A;  11:30 Am  . POLYPECTOMY  10/23/2016   Procedure: POLYPECTOMY;  Surgeon: Danie Binder, MD;  [facility];  Service: Endoscopy;;  sigmoid colon polyp  . PORTACATH PLACEMENT Left 02/16/2019   Procedure: INSERTION PORT-A-CATH (attached catheter in left subclavian);  Surgeon: Virl Cagey, MD;  Location: [facility];  Service: General;  Laterality: Left;  . RT HIP SURGERY    . TOTAL HIP ARTHROPLASTY Left 04/07/2016   Procedure: LEFT TOTAL HIP ARTHROPLASTY ANTERIOR APPROACH;  Surgeon: Paralee Cancel, MD;  Location: [facility];  Service: Orthopedics;  Laterality: Left;  Unsuccessful for Spinal, went to General   Allergies No Known Allergies   Family history  Father died of prostate cancer at age 42.  Mother lived to be 76. 1 brother had APR for colon carcinoma when he was a teenager and died of a different malignancy at age 64.  Another brother died of ruptured intracranial aneurysm.  He was in his 69s.  She has 1 brother living who is 63 years old and in good health. She has 4 sisters ages 32, 57, 68 and 56 in good health other than some of them have  hypertension.  Social history  Patient is single and she does not have any children. 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  She is now retired. She has been smoking for the past 10 years.  1 pack lasts her for 1 week.  She has never drank alcohol.  She does not do any schedule physical activity.  She stays busy and usual household work.   Physical examination  Blood pressure (!) 150/76, pulse 69, temperature (!) 97.1 F (36.2 C), temperature source Temporal, height '5\' 4"'$  (1.626 m), weight 199 lb 3.2 oz (90.4 kg). Patient is alert and in no acute distress. She is wearing a facial mask. Conjunctiva is pink. Sclera is deeply icteric Oropharyngeal mucosa is normal. She has few remaining teeth in upper and lower jaw in satisfactory condition. No neck masses or thyromegaly noted. She has Port-A-Cath port in left pectoral region. Cardiac exam with regular rhythm normal S1 and S2.  She has faint systolic murmur best heard at aortic area. Lungs are clear to auscultation. Abdomen abdomen is full.  Bowel sounds are normal.  On palpation it is soft without a definite  mass or hepatosplenomegaly.  Some fullness noted in lower abdomen on the right side.  Liver edge is indistinct.  Liver is not enlarged. No LE edema or clubbing noted.  Labs/studies Results:  CBC Latest Ref Rng & Units 01/17/2020 01/04/2020 11/29/2019  WBC 4.0 - 10.5 K/uL 5.2 3.6(L) 4.7  Hemoglobin 12.0 - 15.0 g/dL 10.3(L) 10.8(L) 9.8(L)  Hematocrit 36.0 - 46.0 % 30.7(L) 32.8(L) 30.9(L)  Platelets 150 - 400 K/uL 179 126(L) 149(L)    CMP Latest Ref Rng & Units 01/22/2020 01/17/2020 01/10/2020  Glucose 70 - 99 mg/dL - 106(H) -  BUN 8 - 23 mg/dL - 14 -  Creatinine 0.44 - 1.00 mg/dL - 1.59(H) -  Sodium 135 - 145 mmol/L - 139 -  Potassium 3.5 - 5.1 mmol/L - 3.9 -  Chloride 98 - 111 mmol/L - 105 -  CO2 22 - 32 mmol/L - 22 -  Calcium 8.9 - 10.3 mg/dL - 9.6 -  Total Protein 6.5 - 8.1 g/dL 7.6 7.4 7.3  Total Bilirubin 0.3 - 1.2  mg/dL 17.8(H) 12.4(H) 0.7  Alkaline Phos 38 - 126 U/L 398(H) 387(H) 38  AST 15 - 41 U/L 58(H) 55(H) 22  ALT 0 - 44 U/L 51(H) 53(H) 28    Hepatic Function Latest Ref Rng & Units 01/22/2020 01/17/2020 01/10/2020  Total Protein 6.5 - 8.1 g/dL 7.6 7.4 7.3  Albumin 3.5 - 5.0 g/dL 3.3(L) 3.6 4.1  AST 15 - 41 U/L 58(H) 55(H) 22  ALT 0 - 44 U/L 51(H) 53(H) 28  Alk Phosphatase 38 - 126 U/L 398(H) 387(H) 38  Total Bilirubin 0.3 - 1.2 mg/dL 17.8(H) 12.4(H) 0.7  Bilirubin, Direct 0.0 - 0.2 mg/dL 10.9(H) 7.8(H) 0.1    Abdominal pelvic CT from 01/04/2020 and MR reviewed. She has biliary obstruction involving proximal common hepatic duct just distal to the hilum.  Both the right and the left system appear to communicate.    Assessment:  #1.  Obstructive jaundice secondary to known metastatic cholangiocarcinoma.  Her bilirubin has rapidly increased from 0.7-17.8 today since 01/09/2019.  It appears she has complete obstruction.  She would need biliary stenting.  Since both the left and the right system appeared to be communicating single stent would suffice but goal would be to place 1 stent on either side.  Procedure and risks discussed with patient in detail by drawing schedule.  We also discussed that percutaneous stenting may be needed if endoscopic approach fails. Small sphincterotomy may be needed to facilitate stenting.  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    #2.  Rapidly enlarging cystic mass originating from the right ovary concerning for another malignancy.  #3.  Anemia felt to be due to chronic disease and folate deficiency.  Recommendations  Patient will go to the Cov[facility] clinic for testing tomorrow. Patient will go to oncology clinic to receive 10 mg of vitamin K SQ. I have called the clinic. ERCP with biliary stenting on 01/24/2020.  We will also  obtain brushings cytology at that time.

## 2020-01-22 NOTE — Patient Instructions (Addendum)
[place] at [facility] Discharge Instructions  You were seen today by Dr. Delton Coombes. He went over how you've been feeling since your last visit. He will see you back in 1 week for labs and follow up.   Thank you for choosing [place] at [facility] to provide your oncology and hematology care.  To afford each patient quality time with our provider, please arrive at least 15 minutes before your scheduled appointment time.   If you have a lab appointment with the [facility] please come in thru the  Main Entrance and check in at the main information desk  You need to re-schedule your appointment should you arrive 10 or more minutes late.  We strive to give you quality time with our providers, and arriving late affects you and other patients whose appointments are after yours.  Also, if you no show three or more times for appointments you may be dismissed from the clinic at the providers discretion.     Again, thank you for choosing [facility].  Our hope is that these requests will decrease the amount of time that you wait before being seen by our physicians.       _____________________________________________________________  Should you have questions after your visit to [facility], please contact our office at [phone] between the hours of 8:00 a.m. and 4:30 p.m.  Voicemails left after 4:00 p.m. will not be returned until the following business day.  For prescription refill requests, have your pharmacy contact our office and allow 72 hours.    Cancer Center Support Programs:   > Cancer Support Group  2nd Tuesday of the month 1pm-2pm, Journey Room

## 2020-01-22 NOTE — Progress Notes (Signed)
[place], [place] [address]   CLINIC:  Medical Oncology/Hematology  PCP:  Cory Munch, PA-C [place] [id number] [phone]   REASON FOR VISIT: Follow-up for cholangiocarcinoma to the liver.  CURRENT THERAPY: Observation.  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[place])   02/22/2019 -  Chemotherapy   The patient had palonosetron (ALOXI) injection 0.25 mg, 0.25 mg, Intravenous,  Once, 10 of 10 cycles Administration: 0.25 mg (02/22/2019), 0.25 mg (03/01/2019), 0.25 mg (03/15/2019), 0.25 mg (03/22/2019), 0.25 mg (04/06/2019), 0.25 mg (04/13/2019), 0.25 mg (04/27/2019), 0.25 mg (05/04/2019), 0.25 mg (05/18/2019), 0.25 mg (05/25/2019), 0.25 mg (06/16/2019), 0.25 mg (06/22/2019), 0.25 mg (07/13/2019), 0.25 mg (07/20/2019), 0.25 mg (08/10/2019), 0.25 mg (08/24/2019), 0.25 mg (09/07/2019), 0.25 mg (09/14/2019) pegfilgrastim-cbqv (UDENYCA) injection 6 mg, 6 mg, Subcutaneous, Once, 10 of 10 cycles Administration: 6 mg (03/02/2019), 6 mg (03/24/2019), 6 mg (05/05/2019), 6 mg (05/26/2019), 6 mg (06/23/2019), 6 mg (07/21/2019), 6 mg (08/25/2019), 6 mg (09/15/2019) CISplatin (PLATINOL) 52 mg in sodium chloride 0.9 % 250 mL chemo infusion, 25 mg/m2 = 52 mg, Intravenous,  Once, 10 of 10 cycles Dose modification: 20 mg/m2 (80 % of original dose 25 mg/m2, Cycle 9, Reason: Other (see comments), Comment: renal insufficiency) Administration: 52 mg (02/22/2019), 52 mg (03/01/2019), 52 mg (03/15/2019), 52 mg (03/22/2019), 52 mg (04/06/2019), 52 mg (04/13/2019), 52 mg (04/27/2019), 52 mg (05/04/2019), 52 mg (05/18/2019), 52 mg (05/25/2019), 52 mg (06/16/2019), 52 mg (06/22/2019), 52 mg (07/13/2019), 52 mg (07/20/2019), 52 mg (08/10/2019), 52 mg (08/24/2019), 42 mg (09/07/2019), 42 mg (09/14/2019) gemcitabine (GEMZAR) 2,000 mg in sodium chloride 0.9 % 250 mL chemo infusion,  2,090 mg, Intravenous,  Once, 10 of 10 cycles Dose modification: 750 mg/m2 (75 % of original dose 1,000 mg/m2, Cycle 4, Reason: Other (see comments), Comment: neutropenia), 750 mg/m2 (75 % of original dose 1,000 mg/m2, Cycle 7, Reason: Other (see comments), Comment: neutripenia) Administration: 2,000 mg (02/22/2019), 2,000 mg (03/01/2019), 2,000 mg (03/15/2019), 2,000 mg (03/22/2019), 2,000 mg (04/06/2019), 2,000 mg (04/13/2019), 2,000 mg (04/27/2019), 1,558 mg (05/04/2019), 2,000 mg (05/18/2019), 2,000 mg (05/25/2019), 2,000 mg (06/16/2019), 2,000 mg (06/22/2019), 2,000 mg (07/13/2019), 1,558 mg (07/20/2019), 2,000 mg (08/10/2019), 2,000 mg (08/24/2019), 2,000 mg (09/07/2019), 2,000 mg (09/14/2019) fosaprepitant (EMEND) 150 mg, dexamethasone (DECADRON) 12 mg in sodium chloride 0.9 % 145 mL IVPB, , Intravenous,  Once, 10 of 10 cycles Administration:  (02/22/2019),  (03/01/2019),  (03/15/2019),  (03/22/2019),  (04/06/2019),  (04/13/2019),  (04/27/2019),  (05/04/2019),  (05/18/2019),  (05/25/2019),  (06/16/2019),  (06/22/2019),  (07/13/2019),  (07/20/2019),  (08/10/2019),  (08/24/2019),  (09/07/2019),  (09/14/2019)  for chemotherapy treatment.       INTERVAL HISTORY:  Nicole Ibarra 64 y.o. female seen for follow-up of her cholangiocarcinoma and hyperbilirubinemia.  She underwent MRCP over the weekend.  She vomited once on Saturday night.  She reports decreased appetite and decreased taste.  She also reports nausea all the time.  Reports itching all over the body, mostly in the palms.  Appetite is 25%.  Energy levels are 50%.  REVIEW OF SYSTEMS:  Review of Systems  Gastrointestinal: Positive for nausea and vomiting.  Skin: Positive for itching.  Neurological: Positive for numbness.  All other systems reviewed and are negative.    PAST MEDICAL/SURGICAL HISTORY:  Past Medical History:  Diagnosis Date  . Anxiety   . Arthritis   . Depression   .  Hypertension    Past Surgical History:  Procedure Laterality Date  . COLONOSCOPY N/A  10/23/2016   Procedure: COLONOSCOPY;  Surgeon: Danie Binder, MD;  [facility];  Service: Endoscopy;  Laterality: N/A;  11:30 Am  . POLYPECTOMY  10/23/2016   Procedure: POLYPECTOMY;  Surgeon: Danie Binder, MD;  Location: [facility];  Service: Endoscopy;;  sigmoid colon polyp  . PORTACATH PLACEMENT Left 02/16/2019   Procedure: INSERTION PORT-A-CATH (attached catheter in left subclavian);  Surgeon: Virl Cagey, MD;  Location: [facility] ORS;  Service: General;  Laterality: Left;  . RT HIP SURGERY    . TOTAL HIP ARTHROPLASTY Left 04/07/2016   Procedure: LEFT TOTAL HIP ARTHROPLASTY ANTERIOR APPROACH;  Surgeon: Paralee Cancel, MD;  Location: [facility] ORS;  Service: Orthopedics;  Laterality: Left;  Unsuccessful for Spinal, went to General     SOCIAL HISTORY:  Social History   Socioeconomic History  . Marital status: Single    Spouse name: Not on file  . Number of children: Not on file  . Years of education: Not on file  . Highest education level: Not on file  Occupational History  . Not on file  Tobacco Use  . Smoking status: Current Every Day Smoker    Packs/day: 0.25    Years: 42.00    Pack years: 10.50  . Smokeless tobacco: Never Used  Substance and Sexual Activity  . Alcohol use: No  . Drug use: No  . Sexual activity: Not Currently  Other Topics Concern  . Not on file  Social History Narrative  . Not on file   Social Determinants of Health   Financial Resource Strain:   . Difficulty of Paying Living Expenses: Not on file  Food Insecurity:   . Worried About Charity fundraiser in the Last Year: Not on file  . Ran Out of Food in the Last Year: Not on file  Transportation Needs:   . Lack of Transportation (Medical): Not on file  . Lack of Transportation (Non-Medical): Not on file  Physical Activity:   . Days of Exercise per Week: Not on file  . Minutes of Exercise per Session: Not on file  Stress:   . Feeling of Stress : Not on file  Social Connections:   .  Frequency of Communication with Friends and Family: Not on file  . Frequency of Social Gatherings with Friends and Family: Not on file  . Attends Religious Services: Not on file  . Active Member of Clubs or Organizations: Not on file  . Attends Archivist Meetings: Not on file  . Marital Status: Not on file  Intimate Partner Violence:   . Fear of Current or Ex-Partner: Not on file  . Emotionally Abused: Not on file  . Physically Abused: Not on file  . Sexually Abused: Not on file    FAMILY HISTORY:  Family History  Problem Relation Age of Onset  . Hypertension Mother   . Cancer Father   . Hypertension Brother   . Cancer Brother   . Hypertension Sister   . Cancer Sister     CURRENT MEDICATIONS:  Outpatient Encounter Medications as of 01/22/2020  Medication Sig  . amLODipine (NORVASC) 10 MG tablet Take 1 tablet (10 mg total) by mouth daily.  Marland Kitchen doxazosin (CARDURA) 2 MG tablet Take 2 mg by mouth at bedtime.   . potassium chloride (KLOR-CON) 10 MEQ tablet TAKE 2 TABLETS (20 MEQ TOTAL) BY MOUTH 2 (TWO) TIMES DAILY. (Patient  taking differently: Take 20 mEq by mouth 2 (two) times daily. )  . [DISCONTINUED] vitamin B-12 (CYANOCOBALAMIN) 50 MCG tablet Take 50 mcg by mouth daily.  . diazepam (VALIUM) 5 MG tablet Take 1 tablet at 1pm today, take 1 tablet at 145 pm today, may take 1 tablet just prior to exam if still needed today. (Patient not taking: Reported on 01/22/2020)  . lidocaine (XYLOCAINE) 2 % solution Swish and swallow 1 tablespoon four times a day prn sore mouth (Patient taking differently: Use as directed 15 mLs in the mouth or throat every 6 (six) hours as needed for mouth pain. Swish and swallow 1 tablespoon four times a day prn sore mouth)  . lidocaine-prilocaine (EMLA) cream Apply to skin over port a cath one hour prior to chemotherapy appointment (Patient taking differently: Apply 1 application topically daily as needed (port access). )  . prochlorperazine (COMPAZINE)  10 MG tablet TAKE 1 TABLET (10 MG TOTAL) BY MOUTH EVERY 6 (SIX) HOURS AS NEEDED (NAUSEA OR VOMITING).  . [DISCONTINUED] CISPLATIN IV Inject into the vein. Day 1, 8 every 21 days  . [DISCONTINUED] GEMCITABINE HCL IV Inject into the vein. Day 1, 8 every 21 days  . [DISCONTINUED] naproxen sodium (ALEVE) 220 MG tablet Take 220 mg by mouth 2 (two) times daily as needed.   . [DISCONTINUED] prochlorperazine (COMPAZINE) 10 MG tablet TAKE 1 TABLET (10 MG TOTAL) BY MOUTH EVERY 6 (SIX) HOURS AS NEEDED (NAUSEA OR VOMITING).   No facility-administered encounter medications on file as of 01/22/2020.    ALLERGIES:  No Known Allergies   PHYSICAL EXAM:  ECOG Performance status: 1  Vitals:   01/22/20 0816  BP: (!) 148/68  Pulse: 75  Resp: 18  Temp: (!) 97.5 F (36.4 C)  SpO2: 100%   Filed Weights   01/22/20 0816  Weight: 201 lb 6.4 oz (91.4 kg)    Physical Exam Constitutional:      Appearance: Normal appearance. She is normal weight.  Cardiovascular:     Rate and Rhythm: Normal rate and regular rhythm.     Heart sounds: Normal heart sounds.  Pulmonary:     Effort: Pulmonary effort is normal.     Breath sounds: Normal breath sounds.  Abdominal:     General: Bowel sounds are normal.     Palpations: Abdomen is soft.  Musculoskeletal:        General: Normal range of motion.  Skin:    General: Skin is warm and dry.  Neurological:     Mental Status: She is alert and oriented to person, place, and time. Mental status is at baseline.  Psychiatric:        Mood and Affect: Mood normal.        Behavior: Behavior normal.        Thought Content: Thought content normal.        Judgment: Judgment normal.      LABORATORY DATA:  I have reviewed the labs as listed.  CBC    Component Value Date/Time   WBC 5.2 01/17/2020 1449   RBC 3.31 (L) 01/17/2020 1449   HGB 10.3 (L) 01/17/2020 1449   HCT 30.7 (L) 01/17/2020 1449   PLT 179 01/17/2020 1449   MCV 92.7 01/17/2020 1449   MCH 31.1  01/17/2020 1449   MCHC 33.6 01/17/2020 1449   RDW 18.7 (H) 01/17/2020 1449   LYMPHSABS 1.4 01/17/2020 1449   MONOABS 0.5 01/17/2020 1449   EOSABS 0.1 01/17/2020 1449   BASOSABS 0.0 01/17/2020  1449   CMP Latest Ref Rng & Units 01/22/2020 01/17/2020 01/10/2020  Glucose 70 - 99 mg/dL - 106(H) -  BUN 8 - 23 mg/dL - 14 -  Creatinine 0.44 - 1.00 mg/dL - 1.59(H) -  Sodium 135 - 145 mmol/L - 139 -  Potassium 3.5 - 5.1 mmol/L - 3.9 -  Chloride 98 - 111 mmol/L - 105 -  CO2 22 - 32 mmol/L - 22 -  Calcium 8.9 - 10.3 mg/dL - 9.6 -  Total Protein 6.5 - 8.1 g/dL 7.6 7.4 7.3  Total Bilirubin 0.3 - 1.2 mg/dL 17.8(H) 12.4(H) 0.7  Alkaline Phos 38 - 126 U/L 398(H) 387(H) 38  AST 15 - 41 U/L 58(H) 55(H) 22  ALT 0 - 44 U/L 51(H) 53(H) 28    I have independently reviewed scans and discussed with the patient.   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 -CT CAP on 01/04/2020 showed large septated cystic mass arising from the adnexa, increased in volume from 10/17/2019.  Large mass displaces surrounding bowel loops.  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  Stable porta hepatic adenopathy.  Mass along the gallbladder fossa is difficult to measure based on orientation.  Coronal plane measures 2.9 x 2.7 cm, previously 2.7 x 2.5 cm.  Mild but increased intrahepatic biliary dilatation.  Several small pulmonary nodules stable. -We will have to start her on some form of therapy.  Will consider molecular testing with foundation 1.  2.  Hyperbilirubinemia: -This is a new problem.  Today her bilirubin increased to 18.  Direct bilirubin is around 10. -MRCP shows 1.9 cm soft tissue lesion obliterating the common bile duct. -She reports itching all over the body, worse in the palms.  She reports decreased appetite and decreased taste. -I have recommended ERCP with  stent placement.  We made a referral to Dr. Laural Golden.  I talked to him.  This patient will be seen tomorrow.  3.  Peripheral neuropathy: -Numbness in the feet from prior cisplatin exposure with no neuropathic pains.  4.  Folic acid deficiency: -He will continue folic acid 1 mg daily.  5.  Nausea/vomiting: -He vomited once Saturday night.  She remains nauseous all the time. -I have told her to take Compazine twice daily as a standing dose.   Orders placed this encounter:  Orders Placed This Encounter  Procedures  . Hepatic function panel      Derek Jack, MD [place] [phone]

## 2020-01-22 NOTE — Patient Instructions (Signed)
ERCP with biliary stenting to be scheduled. Please report to emergency room if you have abdominal pain or fever.

## 2020-01-22 NOTE — Assessment & Plan Note (Addendum)
1.  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 -CT CAP on 01/04/2020 showed large septated cystic mass arising from the adnexa, increased in volume from 10/17/2019.  Large mass displaces surrounding bowel loops.  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  Stable porta hepatic adenopathy.  Mass along the gallbladder fossa is difficult to measure based on orientation.  Coronal plane measures 2.9 x 2.7 cm, previously 2.7 x 2.5 cm.  Mild but increased intrahepatic biliary dilatation.  Several small pulmonary nodules stable. -We will have to start her on some form of therapy.  Will consider molecular testing with foundation 1.  2.  Hyperbilirubinemia: -This is a new problem.  Today her bilirubin increased to 18.  Direct bilirubin is around 10. -MRCP shows 1.9 cm soft tissue lesion obliterating the common bile duct. -She reports itching all over the body, worse in the palms.  She reports decreased appetite and decreased taste. -I have recommended ERCP with stent placement.  We made a referral to Dr. Laural Golden.  I talked to him.  This patient will be seen tomorrow.  3.  Peripheral neuropathy: -Numbness in the feet from prior cisplatin exposure with no neuropathic pains.  4.  Folic acid deficiency: -He will continue folic acid 1 mg daily.  5.  Nausea/vomiting: -He vomited once Saturday night.  She remains nauseous all the time. -I have told her to take Compazine twice daily as a standing dose.

## 2020-01-23 ENCOUNTER — Other Ambulatory Visit (HOSPITAL_COMMUNITY)
Admission: RE | Admit: 2020-01-23 | Discharge: 2020-01-23 | Disposition: A | Discharge status: Home / Self Care | Payer: Medicare Other | Source: Ambulatory Visit | Attending: Internal Medicine | Admitting: Internal Medicine

## 2020-01-23 ENCOUNTER — Encounter (HOSPITAL_COMMUNITY): Payer: Self-pay

## 2020-01-23 ENCOUNTER — Ambulatory Visit (INDEPENDENT_AMBULATORY_CARE_PROVIDER_SITE_OTHER): Payer: Medicare Other | Admitting: Internal Medicine

## 2020-01-23 ENCOUNTER — Inpatient Hospital Stay (HOSPITAL_COMMUNITY): Payer: Medicare Other

## 2020-01-23 ENCOUNTER — Ambulatory Visit (HOSPITAL_COMMUNITY): Payer: Medicare Other

## 2020-01-23 ENCOUNTER — Encounter (HOSPITAL_COMMUNITY)
Admission: RE | Admit: 2020-01-23 | Discharge: 2020-01-23 | Disposition: A | Discharge status: Home / Self Care | Payer: Medicare Other | Source: Ambulatory Visit | Attending: Internal Medicine | Admitting: Internal Medicine

## 2020-01-23 VITALS — BP 156/62 | HR 74 | Temp 97.0°F | Resp 18

## 2020-01-23 DIAGNOSIS — Z96642 Presence of left artificial hip joint: Secondary | ICD-10-CM | POA: Diagnosis not present

## 2020-01-23 DIAGNOSIS — Z79899 Other long term (current) drug therapy: Secondary | ICD-10-CM | POA: Diagnosis not present

## 2020-01-23 DIAGNOSIS — M1990 Unspecified osteoarthritis, unspecified site: Secondary | ICD-10-CM | POA: Diagnosis not present

## 2020-01-23 DIAGNOSIS — R17 Unspecified jaundice: Secondary | ICD-10-CM | POA: Diagnosis present

## 2020-01-23 DIAGNOSIS — Z01812 Encounter for preprocedural laboratory examination: Secondary | ICD-10-CM | POA: Insufficient documentation

## 2020-01-23 DIAGNOSIS — F419 Anxiety disorder, unspecified: Secondary | ICD-10-CM | POA: Diagnosis not present

## 2020-01-23 DIAGNOSIS — C240 Malignant neoplasm of extrahepatic bile duct: Secondary | ICD-10-CM | POA: Diagnosis not present

## 2020-01-23 DIAGNOSIS — Z8249 Family history of ischemic heart disease and other diseases of the circulatory system: Secondary | ICD-10-CM | POA: Diagnosis not present

## 2020-01-23 DIAGNOSIS — I10 Essential (primary) hypertension: Secondary | ICD-10-CM | POA: Diagnosis not present

## 2020-01-23 DIAGNOSIS — C221 Intrahepatic bile duct carcinoma: Secondary | ICD-10-CM | POA: Diagnosis not present

## 2020-01-23 DIAGNOSIS — F17200 Nicotine dependence, unspecified, uncomplicated: Secondary | ICD-10-CM | POA: Diagnosis not present

## 2020-01-23 DIAGNOSIS — Z20822 Contact with and (suspected) exposure to covid-19: Secondary | ICD-10-CM | POA: Insufficient documentation

## 2020-01-23 DIAGNOSIS — K2960 Other gastritis without bleeding: Secondary | ICD-10-CM | POA: Diagnosis not present

## 2020-01-23 DIAGNOSIS — Z8719 Personal history of other diseases of the digestive system: Secondary | ICD-10-CM | POA: Diagnosis not present

## 2020-01-23 DIAGNOSIS — K831 Obstruction of bile duct: Secondary | ICD-10-CM | POA: Diagnosis not present

## 2020-01-23 LAB — SARS CORONAVIRUS 2 (TAT 6-24 HRS): SARS Coronavirus 2: NEGATIVE

## 2020-01-23 MED ORDER — PHYTONADIONE 10 MG/ML INJECTION
10.0000 mg | Freq: Once | INTRAMUSCULAR | Status: AC
  Administered 2020-01-23: 10 mg via SUBCUTANEOUS
  Filled 2020-01-23: qty 1

## 2020-01-23 NOTE — Progress Notes (Signed)
Patient tolerated Vitamin K injection with no complaints voiced.  Site clean and dry with no bruising or swelling noted at site.  Band aid applied.  Vss with discharge and left ambulatory with no s/s of distress noted.

## 2020-01-24 ENCOUNTER — Ambulatory Visit (HOSPITAL_COMMUNITY)
Admission: RE | Admit: 2020-01-24 | Discharge: 2020-01-24 | Disposition: A | Discharge status: Home / Self Care | Payer: Medicare Other | Attending: Internal Medicine | Admitting: Internal Medicine

## 2020-01-24 ENCOUNTER — Ambulatory Visit (HOSPITAL_COMMUNITY): Payer: Medicare Other | Admitting: Anesthesiology

## 2020-01-24 ENCOUNTER — Ambulatory Visit (HOSPITAL_COMMUNITY): Payer: Medicare Other

## 2020-01-24 ENCOUNTER — Encounter (HOSPITAL_COMMUNITY): Payer: Self-pay | Admitting: Internal Medicine

## 2020-01-24 ENCOUNTER — Encounter (HOSPITAL_COMMUNITY)
Admission: RE | Disposition: A | Discharge status: Home / Self Care | Payer: Self-pay | Source: Home / Self Care | Attending: Internal Medicine

## 2020-01-24 DIAGNOSIS — K831 Obstruction of bile duct: Secondary | ICD-10-CM | POA: Insufficient documentation

## 2020-01-24 DIAGNOSIS — C221 Intrahepatic bile duct carcinoma: Secondary | ICD-10-CM | POA: Insufficient documentation

## 2020-01-24 DIAGNOSIS — C801 Malignant (primary) neoplasm, unspecified: Secondary | ICD-10-CM

## 2020-01-24 DIAGNOSIS — Z8249 Family history of ischemic heart disease and other diseases of the circulatory system: Secondary | ICD-10-CM | POA: Insufficient documentation

## 2020-01-24 DIAGNOSIS — Z79899 Other long term (current) drug therapy: Secondary | ICD-10-CM | POA: Insufficient documentation

## 2020-01-24 DIAGNOSIS — K2960 Other gastritis without bleeding: Secondary | ICD-10-CM | POA: Diagnosis not present

## 2020-01-24 DIAGNOSIS — M1990 Unspecified osteoarthritis, unspecified site: Secondary | ICD-10-CM | POA: Insufficient documentation

## 2020-01-24 DIAGNOSIS — R17 Unspecified jaundice: Secondary | ICD-10-CM

## 2020-01-24 DIAGNOSIS — Z8719 Personal history of other diseases of the digestive system: Secondary | ICD-10-CM | POA: Insufficient documentation

## 2020-01-24 DIAGNOSIS — Z96642 Presence of left artificial hip joint: Secondary | ICD-10-CM | POA: Insufficient documentation

## 2020-01-24 DIAGNOSIS — F17200 Nicotine dependence, unspecified, uncomplicated: Secondary | ICD-10-CM | POA: Insufficient documentation

## 2020-01-24 DIAGNOSIS — I10 Essential (primary) hypertension: Secondary | ICD-10-CM | POA: Insufficient documentation

## 2020-01-24 DIAGNOSIS — F419 Anxiety disorder, unspecified: Secondary | ICD-10-CM | POA: Insufficient documentation

## 2020-01-24 HISTORY — PX: ERCP: SHX5425

## 2020-01-24 HISTORY — PX: SPHINCTEROTOMY: SHX5544

## 2020-01-24 HISTORY — PX: BILIARY STENT PLACEMENT: SHX5538

## 2020-01-24 SURGERY — ERCP, WITH INTERVENTION IF INDICATED
Anesthesia: General

## 2020-01-24 MED ORDER — ROCURONIUM 10MG/ML (10ML) SYRINGE FOR MEDFUSION PUMP - OPTIME
INTRAVENOUS | Status: DC | PRN
  Administered 2020-01-24: 20 mg via INTRAVENOUS

## 2020-01-24 MED ORDER — PROPOFOL 10 MG/ML IV BOLUS
INTRAVENOUS | Status: DC | PRN
  Administered 2020-01-24: 160 mg via INTRAVENOUS

## 2020-01-24 MED ORDER — MIDAZOLAM HCL 2 MG/2ML IJ SOLN: 0.5000 mg | Freq: Once | INTRAMUSCULAR | Status: DC | PRN

## 2020-01-24 MED ORDER — HYDROCODONE-ACETAMINOPHEN 7.5-325 MG PO TABS: 1.0000 | ORAL_TABLET | Freq: Once | ORAL | Status: DC | PRN

## 2020-01-24 MED ORDER — FENTANYL CITRATE (PF) 100 MCG/2ML IJ SOLN
INTRAMUSCULAR | Status: AC
  Filled 2020-01-24: qty 2

## 2020-01-24 MED ORDER — WATER FOR IRRIGATION, STERILE IR SOLN
Status: DC | PRN
  Administered 2020-01-24: 1000 mL

## 2020-01-24 MED ORDER — SODIUM CHLORIDE 0.9 % IV SOLN
INTRAVENOUS | Status: AC
  Filled 2020-01-24: qty 100

## 2020-01-24 MED ORDER — SUCCINYLCHOLINE CHLORIDE 200 MG/10ML IV SOSY
PREFILLED_SYRINGE | INTRAVENOUS | Status: AC
  Filled 2020-01-24: qty 20

## 2020-01-24 MED ORDER — INDOMETHACIN 50 MG RE SUPP
100.0000 mg | Freq: Once | RECTAL | Status: AC
  Administered 2020-01-24: 100 mg via RECTAL

## 2020-01-24 MED ORDER — ONDANSETRON HCL 4 MG/2ML IJ SOLN
INTRAMUSCULAR | Status: AC
  Filled 2020-01-24: qty 2

## 2020-01-24 MED ORDER — HYDROMORPHONE HCL 1 MG/ML IJ SOLN: 0.2500 mg | INTRAMUSCULAR | Status: DC | PRN

## 2020-01-24 MED ORDER — ONDANSETRON HCL 4 MG/2ML IJ SOLN
INTRAMUSCULAR | Status: DC | PRN
  Administered 2020-01-24: 4 mg via INTRAVENOUS

## 2020-01-24 MED ORDER — MIDAZOLAM HCL 5 MG/5ML IJ SOLN
INTRAMUSCULAR | Status: DC | PRN
  Administered 2020-01-24: 2 mg via INTRAVENOUS

## 2020-01-24 MED ORDER — CHLORHEXIDINE GLUCONATE CLOTH 2 % EX PADS: 6.0000 | MEDICATED_PAD | Freq: Once | CUTANEOUS | Status: DC

## 2020-01-24 MED ORDER — GLUCAGON HCL RDNA (DIAGNOSTIC) 1 MG IJ SOLR
INTRAMUSCULAR | Status: DC | PRN
  Administered 2020-01-24 (×6): .25 mg via INTRAVENOUS

## 2020-01-24 MED ORDER — SODIUM CHLORIDE 0.9 % IV SOLN
INTRAVENOUS | Status: DC | PRN
  Administered 2020-01-24: 100 mL

## 2020-01-24 MED ORDER — CEFAZOLIN SODIUM-DEXTROSE 2-4 GM/100ML-% IV SOLN
2.0000 g | INTRAVENOUS | Status: AC
  Administered 2020-01-24: 10:00:00 2 g via INTRAVENOUS
  Filled 2020-01-24: qty 100

## 2020-01-24 MED ORDER — SUCCINYLCHOLINE 20MG/ML (10ML) SYRINGE FOR MEDFUSION PUMP - OPTIME
INTRAMUSCULAR | Status: DC | PRN
  Administered 2020-01-24: 120 mg via INTRAVENOUS

## 2020-01-24 MED ORDER — LIDOCAINE 2% (20 MG/ML) 5 ML SYRINGE
INTRAMUSCULAR | Status: AC
  Filled 2020-01-24: qty 10

## 2020-01-24 MED ORDER — PANTOPRAZOLE SODIUM 40 MG PO TBEC: 40.0000 mg | DELAYED_RELEASE_TABLET | Freq: Every day | ORAL | 5 refills | Status: AC

## 2020-01-24 MED ORDER — INDOMETHACIN 50 MG RE SUPP
RECTAL | Status: AC
  Filled 2020-01-24: qty 2

## 2020-01-24 MED ORDER — FENTANYL CITRATE (PF) 100 MCG/2ML IJ SOLN
INTRAMUSCULAR | Status: DC | PRN
  Administered 2020-01-24: 50 ug via INTRAVENOUS

## 2020-01-24 MED ORDER — SUGAMMADEX SODIUM 200 MG/2ML IV SOLN
INTRAVENOUS | Status: DC | PRN
  Administered 2020-01-24: 200 mg via INTRAVENOUS

## 2020-01-24 MED ORDER — LACTATED RINGERS IV SOLN: INTRAVENOUS | Status: DC

## 2020-01-24 MED ORDER — LIDOCAINE HCL (CARDIAC) PF 50 MG/5ML IV SOSY
PREFILLED_SYRINGE | INTRAVENOUS | Status: DC | PRN
  Administered 2020-01-24: 60 mg via INTRAVENOUS

## 2020-01-24 MED ORDER — PROMETHAZINE HCL 25 MG/ML IJ SOLN: 6.2500 mg | INTRAMUSCULAR | Status: DC | PRN

## 2020-01-24 MED ORDER — OXYCODONE HCL 5 MG PO TABS: 5.0000 mg | ORAL_TABLET | ORAL | 0 refills | Status: AC | PRN

## 2020-01-24 MED ORDER — INDOMETHACIN 25 MG SUPPOSITORY
100.0000 mg | Freq: Once | RECTAL | Status: DC
  Filled 2020-01-24: qty 4

## 2020-01-24 MED ORDER — GLUCAGON HCL RDNA (DIAGNOSTIC) 1 MG IJ SOLR
INTRAMUSCULAR | Status: AC
  Filled 2020-01-24: qty 2

## 2020-01-24 MED ORDER — MIDAZOLAM HCL 2 MG/2ML IJ SOLN
INTRAMUSCULAR | Status: AC
  Filled 2020-01-24: qty 2

## 2020-01-24 MED ORDER — SIMETHICONE 40 MG/0.6ML PO SUSP
ORAL | Status: AC
  Filled 2020-01-24: qty 0.6

## 2020-01-24 MED ORDER — ROCURONIUM BROMIDE 10 MG/ML (PF) SYRINGE
PREFILLED_SYRINGE | INTRAVENOUS | Status: AC
  Filled 2020-01-24: qty 20

## 2020-01-24 NOTE — H&P (Signed)
Nicole Ibarra is an 64 y.o. female.   Chief Complaint: Patient is here for ERCP and biliary stenting.  HPI: Patient is 64 year old female with known metastatic glandular carcinoma who has developed obstructive jaundice secondary to bile duct obstruction involving proximal common hepatic duct. The see my consult note/H&P from 01/22/2020 for details. Patient said she has not experienced abdominal pain nausea vomiting fever or chills since she was last seen.  She remains with itching.  She states her urine is dark but stool is brown and not acholic.  Past Medical History:  Diagnosis Date  . Anxiety   . Arthritis   . Cancer ([place]) 01/2019   Metastatic cholangiocarcinoma  . Depression   . Hypertension     Past Surgical History:  Procedure Laterality Date  . COLONOSCOPY N/A 10/23/2016   Procedure: COLONOSCOPY;  Surgeon: Danie Binder, MD;  [facility];  Service: Endoscopy;  Laterality: N/A;  11:30 Am  . POLYPECTOMY  10/23/2016   Procedure: POLYPECTOMY;  Surgeon: Danie Binder, MD;  [facility];  Service: Endoscopy;;  sigmoid colon polyp  . PORTACATH PLACEMENT Left 02/16/2019   Procedure: INSERTION PORT-A-CATH (attached catheter in left subclavian);  Surgeon: Virl Cagey, MD;  Location: [facility]S;  Service: General;  Laterality: Left;  . RT HIP SURGERY    . TOTAL HIP ARTHROPLASTY Left 04/07/2016   Procedure: LEFT TOTAL HIP ARTHROPLASTY ANTERIOR APPROACH;  Surgeon: Paralee Cancel, MD;  [facility];  Service: Orthopedics;  Laterality: Left;  Unsuccessful for Spinal, went to General    Family History  Problem Relation Age of Onset  . Hypertension Mother   . Cancer Father   . Hypertension Brother   . Cancer Brother   . Hypertension Sister   . Cancer Sister    Social History:  reports that she has been smoking. She has a 10.50 pack-year smoking history. She has never used smokeless tobacco. She reports that she does not drink alcohol or use drugs.  Allergies:  No Known Allergies  Medications Prior to Admission  Medication Sig Dispense Refill  . amLODipine (NORVASC) 10 MG tablet Take 1 tablet (10 mg total) by mouth daily. 30 tablet 6  . doxazosin (CARDURA) 2 MG tablet Take 2 mg by mouth at bedtime.     . lidocaine (XYLOCAINE) 2 % solution Swish and swallow 1 tablespoon four times a day prn sore mouth (Patient taking differently: Use as directed 15 mLs in the mouth or throat every 6 (six) hours as needed for mouth pain. Swish and swallow 1 tablespoon four times a day prn sore mouth) 360 mL 2  . lidocaine-prilocaine (EMLA) cream Apply to skin over port a cath one hour prior to chemotherapy appointment (Patient taking differently: Apply 1 application topically daily as needed (port access). ) 30 g 3  . potassium chloride (KLOR-CON) 10 MEQ tablet TAKE 2 TABLETS (20 MEQ TOTAL) BY MOUTH 2 (TWO) TIMES DAILY. (Patient taking differently: Take 20 mEq by mouth 2 (two) times daily. ) 360 tablet 2  . prochlorperazine (COMPAZINE) 10 MG tablet TAKE 1 TABLET (10 MG TOTAL) BY MOUTH EVERY 6 (SIX) HOURS AS NEEDED (NAUSEA OR VOMITING). 60 tablet 1  . diazepam (VALIUM) 5 MG tablet Take 1 tablet at 1pm today, take 1 tablet at 145 pm today, may take 1 tablet just prior to exam if still needed today. 30 tablet 0    Results for orders placed or performed during the hospital encounter of 01/23/20 (from the  past 48 hour(s))  SARS CORONAVIRUS 2 (TAT 6-24 HRS) Nasopharyngeal Nasopharyngeal Swab     Status: None   Collection Time: 01/23/20  6:40 AM   Specimen: Nasopharyngeal Swab  Result Value Ref Range   SARS Coronavirus 2 NEGATIVE NEGATIVE    Comment: (NOTE) SARS-CoV-2 target nucleic acids are NOT DETECTED. The SARS-CoV-2 RNA is generally detectable in upper and lower respiratory specimens during the acute phase of infection. Negative results do not preclude SARS-CoV-2 infection, do not rule out co-infections with other pathogens, and should not be used as the sole basis for  treatment or other patient management decisions. Negative results must be combined with clinical observations, patient history, and epidemiological information. The expected result is Negative. Fact Sheet for Patients: SugarRoll.be Fact Sheet for Healthcare Providers: https://www.woods-mathews.com/ This test is not yet approved or cleared by the [place] FDA and  has been authorized for detection and/or diagnosis of SARS-CoV-2 by FDA under an Emergency Use Authorization (EUA). This EUA will remain  in effect (meaning this test can be used) for the duration of the COVID-19 declaration under Section 56 4(b)(1) of the Act, 21 U.S.C. section 360bbb-3(b)(1), unless the authorization is terminated or revoked sooner. Performed at [facility], [place] [address], [place], [place] [address]    No results found.  Review of Systems  Blood pressure 132/79, pulse 76, temperature 98.4 F (36.9 C), temperature source Oral, resp. rate 17, SpO2 97 %. Physical Exam  Constitutional: She appears well-developed and well-nourished.  HENT:  Mouth/Throat: Oropharynx is clear and moist.  Eyes: Conjunctivae are normal. Scleral icterus is present.  Neck: No thyromegaly present.  Cardiovascular: Normal rate, regular rhythm and normal heart sounds.  No murmur heard. Respiratory: Effort normal and breath sounds normal.  GI:  Abdomen is full but soft and nontender with organomegaly or masses.  Musculoskeletal:        General: No edema.  Lymphadenopathy:    She has no cervical adenopathy.  Neurological: She is alert.  Skin: Skin is warm and dry.     Assessment/Plan Obstructive jaundice secondary to cholangiocarcinoma. ERCP with biliary stenting.  Hildred Laser, MD 01/24/2020, 9:14 AM

## 2020-01-24 NOTE — Discharge Instructions (Signed)
No aspirin or NSAIDs for 3 days. Clear liquids today.  Usual diet starting tomorrow. Resume usual medications as before. Pantoprazole 40 mg by mouth 30 minutes before breakfast every day. Oxycodone 5 mg by mouth every 4 hours as needed for pain. No driving for 24 hours. LFTs to be checked on Monday next week.       Endoscopic Retrograde Cholangiopancreatogram, Care After This sheet gives you information about how to care for yourself after your procedure. Your health care provider may also give you more specific instructions. If you have problems or questions, contact your health care provider. What can I expect after the procedure? After the procedure, it is common to have:  Soreness in your throat.  Nausea.  Bloating.  Dizziness.  Tiredness (fatigue). Follow these instructions at home:   Take over-the-counter and prescription medicines only as told by your health care provider.  Do not drive for 24 hours if you were given a medicine to help you relax (sedative) during your procedure. Have someone stay with you for 24 hours after the procedure.  Return to your normal activities as told by your health care provider. Ask your health care provider what activities are safe for you.  Return to eating what you normally do as soon as you feel well enough or as told by your health care provider.  Keep all follow-up visits as told by your health care provider. This is important. Contact a health care provider if:  You have pain in your abdomen that does not get better with medicine.  You develop signs of infection, such as: ? Chills. ? Feeling unwell. Get help right away if:  You have difficulty swallowing.  You have worsening pain in your throat, chest, or abdomen.  You vomit bright red blood or a substance that looks like coffee grounds.  You have bloody or very black stools.  You have a fever.  You have a sudden increase in swelling (bloating) in your  abdomen. Summary  After the procedure, it is common to feel tired and to have some discomfort in your throat.  Contact your health care provider if you have signs of infection--such as chills or feeling unwell--or if you have pain that does not improve with medicine.  Get help right away if you have trouble swallowing, worsening pain, bloody or black vomit, bloody or black stools, a fever, or increased swelling in your abdomen.  Keep all follow-up visits as told by your health care provider. This is important. This information is not intended to replace advice given to you by your health care provider. Make sure you discuss any questions you have with your health care provider. Document Revised: 11/05/2017 Document Reviewed: 10/12/2016 Elsevier Patient Education  [place] Liquid Diet, Adult A clear liquid diet is a diet that includes only liquids and semi-liquids that you can see through. You do not eat any food on this diet. Most people need to follow this diet for only a short time. You may need to follow a clear liquid diet if:  You have a problem right before or after you have surgery.  You did not eat food for a long time.  You had any of these: ? Feeling sick to your stomach (nausea). ? Throwing up (vomiting). ? Passing a watery stool (diarrhea).  You are going to have an exam to look at parts of your digestive system.  You are going to have bowel surgery. The goals of this  diet are:  To rest the stomach.  To help you clear the digestive system before an exam.  To make sure that there is enough fluid in your body.  To make sure you get some energy.  To help you get back to eating like you used to. What are tips for following this plan?  A clear liquid is a liquid or semi-liquid that you can see through when you hold it up to a light. An example of this is gelatin.  This diet does not give you all the nutrients that you need. Choose a variety of  the liquids that your doctor says you can drink on this diet. That way, you will get as many nutrients as possible.  If you are not sure whether you can have certain items, ask your doctor. If you are unable to swallow a thin liquid, you will need to thicken it before taking it. This will stop you from breathing it in (aspiration). What foods should I eat?   Water and flavored water.  Fruit juices that do not have pulp, such as cranberry juice and apple juice.  Tea and coffee without milk or cream.  Clear bouillon or broth.  Broth-based soups that have been strained.  Flavored gelatins.  Honey.  Sugar water.  Ice or frozen ice pops that do not have any milk, yogurt, fruit pieces, or fruit pulp in them.  Clear sodas.  Clear sports drinks. The items listed above may not be a complete list of what you can eat and drink. Contact a dietitian for more options. What foods should I avoid?  Juices that have pulp.  Milk.  Cream or cream-based soups.  Yogurt.  Normal foods that are not clear liquids or semi-liquids. The items listed above may not be a complete list of what you should not eat and drink. Contact a dietitian for options. Questions to ask your health care provider  How long do I need to follow this diet?  Are there any medicines that I should change while on this diet? Summary  A clear liquid diet is a diet that includes only liquids and semi-liquids that you can see through.  Some goals of this diet are to rest your stomach, make sure you get enough fluid, and give you some energy.  Avoid liquids with milk, cream, or pulp while you are on this diet. This information is not intended to replace advice given to you by your health care provider. Make sure you discuss any questions you have with your health care provider. Document Revised: 05/16/2018 Document Reviewed: 05/16/2018 Elsevier Patient Education  2020 [place] Anesthesia, Adult,  Care After This sheet gives you information about how to care for yourself after your procedure. Your health care provider may also give you more specific instructions. If you have problems or questions, contact your health care provider. What can I expect after the procedure? After the procedure, the following side effects are common:  Pain or discomfort at the IV site.  Nausea.  Vomiting.  Sore throat.  Trouble concentrating.  Feeling cold or chills.  Weak or tired.  Sleepiness and fatigue.  Soreness and body aches. These side effects can affect parts of the body that were not involved in surgery. Follow these instructions at home:  For at least 24 hours after the procedure:  Have a responsible adult stay with you. It is important to have someone help care for you until you are awake  and alert.  Rest as needed.  Do not: ? Participate in activities in which you could fall or become injured. ? Drive. ? Use heavy machinery. ? Drink alcohol. ? Take sleeping pills or medicines that cause drowsiness. ? Make important decisions or sign legal documents. ? Take care of children on your own. Eating and drinking  Follow any instructions from your health care provider about eating or drinking restrictions.  When you feel hungry, start by eating small amounts of foods that are soft and easy to digest (bland), such as toast. Gradually return to your regular diet.  Drink enough fluid to keep your urine pale yellow.  If you vomit, rehydrate by drinking water, juice, or clear broth. General instructions  If you have sleep apnea, surgery and certain medicines can increase your risk for breathing problems. Follow instructions from your health care provider about wearing your sleep device: ? Anytime you are sleeping, including during daytime naps. ? While taking prescription pain medicines, sleeping medicines, or medicines that make you drowsy.  Return to your normal activities as  told by your health care provider. Ask your health care provider what activities are safe for you.  Take over-the-counter and prescription medicines only as told by your health care provider.  If you smoke, do not smoke without supervision.  Keep all follow-up visits as told by your health care provider. This is important. Contact a health care provider if:  You have nausea or vomiting that does not get better with medicine.  You cannot eat or drink without vomiting.  You have pain that does not get better with medicine.  You are unable to pass urine.  You develop a skin rash.  You have a fever.  You have redness around your IV site that gets worse. Get help right away if:  You have difficulty breathing.  You have chest pain.  You have blood in your urine or stool, or you vomit blood. Summary  After the procedure, it is common to have a sore throat or nausea. It is also common to feel tired.  Have a responsible adult stay with you for the first 24 hours after general anesthesia. It is important to have someone help care for you until you are awake and alert.  When you feel hungry, start by eating small amounts of foods that are soft and easy to digest (bland), such as toast. Gradually return to your regular diet.  Drink enough fluid to keep your urine pale yellow.  Return to your normal activities as told by your health care provider. Ask your health care provider what activities are safe for you. This information is not intended to replace advice given to you by your health care provider. Make sure you discuss any questions you have with your health care provider. Document Revised: 11/26/2017 Document Reviewed: 07/09/2017 Elsevier Patient Education  [place].

## 2020-01-24 NOTE — Op Note (Signed)
[facility] Patient Name: Nicole Ibarra Procedure Date: 01/24/2020 9:48 AM MRN: [id number] Date of Birth: Sep 04, 1956 Attending MD: Hildred Laser , MD CSN: [id number] Age: 65 Admit Type: Outpatient Procedure:                ERCP Indications:              Jaundice, Cholangiocarcinoma Providers:                Hildred Laser, MD, Otis Peak B. Sharon Seller, RN, Nelma Rothman, Technician Referring MD:             Derek Jack, MD Medicines:                General Anesthesia Complications:            No immediate complications. Estimated Blood Loss:     Estimated blood loss was minimal. Procedure:                Pre-Anesthesia Assessment:                           - Prior to the procedure, a History and Physical                            was performed, and patient medications and                            allergies were reviewed. The patient's tolerance of                            previous anesthesia was also reviewed. The risks                            and benefits of the procedure and the sedation                            options and risks were discussed with the patient.                            All questions were answered, and informed consent                            was obtained. Prior Anticoagulants: The patient has                            taken no previous anticoagulant or antiplatelet                            agents. ASA Grade Assessment: III - A patient with                            severe systemic disease. After reviewing the risks  and benefits, the patient was deemed in                            satisfactory condition to undergo the procedure.                           After obtaining informed consent, the scope was                            passed under direct vision. Throughout the                            procedure, the patient's blood pressure, pulse, and                            oxygen saturations  were monitored continuously. The                            [id number] [id number] scope was introduced through the                            mouth, and used to inject contrast into and used to                            inject contrast into the bile duct. The ERCP was                            technically difficult and complex due to                            challenging cannulation. The patient tolerated the                            procedure well. Scope In: 10:20:08 AM Scope Out: 12:06:26 PM Total Procedure Duration: 1 hour 46 minutes 18 seconds  Findings:      The scout film was normal. The major papilla was normal. Deep       cannulation of the ventral pancreatic duct was accomplished with the       short-nosed traction sphincterotome. The bile duct was deeply cannulated       with the short-nosed traction sphincterotome. Contrast was injected. I       personally interpreted the bile duct images. There was brisk flow of       contrast through the ducts. Image quality was adequate. Contrast       extended to the entire biliary tree. The common hepatic duct was       completely obstructed by what appeared to be a mass. The left and right       hepatic ducts and all intrahepatic branches were moderately dilated and       diffusely dilated, with a mass causing an obstruction. The biliary       sphincterotomy was extended to a total of 8 mm in length with a braided       Hydratome sphincterotome using ERBE electrocautery. The sphincterotomy       oozed blood. One 10 Fr  by 9 cm plastic stent with a single external flap       and a single internal flap was placed 8.5 cm into the common bile duct.       Fluid flowed through the stent. The stent was in good position.      Multiple gasr=tric erosions noted at antrum Impression:               - The major papilla appeared normal.                           - A biliary tract obstruction secondary to what                            appeared to be  a mass was found in the proximal                            common hepatic duct.                           - The left and right hepatic ducts and all                            intrahepatic branches were moderately dilated, with                            a mass causing an obstruction.                           - A biliary sphincterotomy was performed.                           - One plastic stent was placed into right hepatic                            sydtem.                           - Erosive antral gastritis. Moderate Sedation:      Per Anesthesia Care Recommendation:           - Discharge patient to home (with escort).                           - Indomethacin supp 100 mg PR once.                           - Clear liquid diet today.                           - Avoid aspirin and nonsteroidal anti-inflammatory                            medicines for 3 days.                           - Continue present medications.                           -  Pantoprazole 40 mg po qam.                           - Oxycodone 5 mg po q4h prn,                           - OV in 2 months. Procedure Code(s):        --- Professional ---                           [phone], Endoscopic retrograde                            cholangiopancreatography (ERCP); with placement of                            endoscopic stent into biliary or pancreatic duct,                            including pre- and post-dilation and guide wire                            passage, when performed, including sphincterotomy,                            when performed, each stent Diagnosis Code(s):        --- Professional ---                           K83.1, Obstruction of bile duct                           R17, Unspecified jaundice                           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 All rights reserved. The codes documented in this report are preliminary and upon coder review  may  be revised to meet current compliance requirements. Hildred Laser, MD Hildred Laser, MD 01/24/2020 12:28:00 PM This report has been signed electronically. Number of Addenda: 0

## 2020-01-24 NOTE — Anesthesia Postprocedure Evaluation (Signed)
Anesthesia Post Note  Patient: [name]  Procedure(s) Performed: ENDOSCOPIC RETROGRADE CHOLANGIOPANCREATOGRAPHY (ERCP) (N/A ) SPHINCTEROTOMY (N/A ) BILIARY STENT PLACEMENT (N/A )  Patient location during evaluation: PACU Anesthesia Type: General Level of consciousness: awake and alert and oriented Pain management: pain level controlled Vital Signs Assessment: post-procedure vital signs reviewed and stable Respiratory status: spontaneous breathing Cardiovascular status: blood pressure returned to baseline and stable Postop Assessment: no apparent nausea or vomiting Anesthetic complications: no     Last Vitals:  Vitals:   01/24/20 1245 01/24/20 1304  BP: (!) 142/67 (!) 151/67  Pulse: 68 73  Resp: 18 18  Temp:  36.7 C  SpO2: 100% 97%    Last Pain:  Vitals:   01/24/20 1304  TempSrc: Oral  PainSc: 0-No pain                 Jabree Rebert

## 2020-01-24 NOTE — Anesthesia Procedure Notes (Signed)
Procedure Name: Intubation Date/Time: 01/24/2020 10:10 AM Performed by: Ollen Bowl, CRNA Pre-anesthesia Checklist: Patient identified, Patient being monitored, Timeout performed, Emergency Drugs available and Suction available Patient Re-evaluated:Patient Re-evaluated prior to induction Oxygen Delivery Method: Circle system utilized Preoxygenation: Pre-oxygenation with 100% oxygen Induction Type: IV induction Ventilation: Mask ventilation without difficulty Laryngoscope Size: Mac and 4 Grade View: Grade I Tube type: Oral Tube size: 7.0 mm Number of attempts: 1 Airway Equipment and Method: Stylet Placement Confirmation: ETT inserted through vocal cords under direct vision,  positive ETCO2 and breath sounds checked- equal and bilateral Secured at: 21 cm Tube secured with: Tape Dental Injury: Teeth and Oropharynx as per pre-operative assessment

## 2020-01-24 NOTE — Progress Notes (Addendum)
Brief ERCP note.  Erosive gastritis. Normal ampulla of Vater. Difficult selective cannulation of bile duct. PD cannulated with guidewire but not filled with contrast.unable to stent for prophylaxis. Normal CBD and distal CHD with cough/obstruction and proximal common hepatic duct. Both right and left intrahepatic systems filled with contrast just to outline the anatomy. Biliary sphincterotomy performed. 10 French 9 cm plastic stent placed in the right hepatic system without any difficulty. Patient tolerated the procedure well.

## 2020-01-24 NOTE — Transfer of Care (Signed)
Immediate Anesthesia Transfer of Care Note  Patient: Nicole Ibarra  Procedure(s) Performed: ENDOSCOPIC RETROGRADE CHOLANGIOPANCREATOGRAPHY (ERCP) (N/A ) SPHINCTEROTOMY (N/A ) BILIARY STENT PLACEMENT (N/A )  Patient Location: PACU  Anesthesia Type:General  Level of Consciousness: awake  Airway & Oxygen Therapy: Patient Spontanous Breathing  Post-op Assessment: Report given to RN  Post vital signs: Reviewed  Last Vitals:  Vitals Value Taken Time  BP 140/66 01/24/20 1222  Temp    Pulse 72 01/24/20 1226  Resp 19 01/24/20 1226  SpO2 100 % 01/24/20 1226  Vitals shown include unvalidated device data.  Last Pain:  Vitals:   01/24/20 0809  TempSrc: Oral  PainSc: 0-No pain      Patients Stated Pain Goal: 5 ([phone] [id number])  Complications: No apparent anesthesia complications

## 2020-01-24 NOTE — Anesthesia Preprocedure Evaluation (Signed)
Anesthesia Evaluation  Patient identified by MRN, date of birth, ID band Patient awake    Reviewed: Allergy & Precautions, NPO status , Patient's Chart, lab work & pertinent test results  Airway Mallampati: I  TM Distance: >3 FB Neck ROM: Full    Dental no notable dental hx. (+) Missing   Pulmonary neg pulmonary ROS, Current Smoker and Patient abstained from smoking.,    Pulmonary exam normal breath sounds clear to auscultation       Cardiovascular Exercise Tolerance: Good hypertension, Pt. on medications negative cardio ROS Normal cardiovascular examI Rhythm:Regular Rate:Normal     Neuro/Psych  Headaches, Anxiety Depression negative psych ROS   GI/Hepatic PUD, GERD  Medicated and Controlled,Cholangiocarcinoma -LFTs up - here for ERCP   Endo/Other  negative endocrine ROS  Renal/GU negative Renal ROS  negative genitourinary   Musculoskeletal  (+) Arthritis , Osteoarthritis,    Abdominal   Peds negative pediatric ROS (+)  Hematology negative hematology ROS (+) anemia ,   Anesthesia Other Findings   Reproductive/Obstetrics negative OB ROS                             Anesthesia Physical Anesthesia Plan  ASA: III  Anesthesia Plan: General   Post-op Pain Management:    Induction: Intravenous  PONV Risk Score and Plan: 2 and Ondansetron, Dexamethasone and Treatment may vary due to age or medical condition  Airway Management Planned: Oral ETT  Additional Equipment:   Intra-op Plan:   Post-operative Plan: Extubation in OR  Informed Consent: I have reviewed the patients History and Physical, chart, labs and discussed the procedure including the risks, benefits and alternatives for the proposed anesthesia with the patient or authorized representative who has indicated his/her understanding and acceptance.     Dental advisory given  Plan Discussed with: CRNA  Anesthesia Plan  Comments: (Plan Full PPE use Plan GETA D/W PT -WTP with same after Q&A  Position -semi prone )        Anesthesia Quick Evaluation

## 2020-01-25 ENCOUNTER — Ambulatory Visit (HOSPITAL_COMMUNITY): Payer: Medicare Other | Admitting: Hematology

## 2020-01-29 ENCOUNTER — Other Ambulatory Visit (HOSPITAL_COMMUNITY): Payer: Self-pay | Admitting: *Deleted

## 2020-01-29 DIAGNOSIS — R17 Unspecified jaundice: Secondary | ICD-10-CM

## 2020-01-30 ENCOUNTER — Inpatient Hospital Stay (HOSPITAL_BASED_OUTPATIENT_CLINIC_OR_DEPARTMENT_OTHER): Payer: Medicare Other | Admitting: Hematology

## 2020-01-30 ENCOUNTER — Encounter (HOSPITAL_COMMUNITY): Payer: Self-pay | Admitting: Hematology

## 2020-01-30 ENCOUNTER — Other Ambulatory Visit: Payer: Self-pay

## 2020-01-30 ENCOUNTER — Inpatient Hospital Stay (HOSPITAL_COMMUNITY): Payer: Medicare Other

## 2020-01-30 VITALS — BP 142/59 | HR 67 | Temp 97.1°F | Resp 18 | Wt 203.5 lb

## 2020-01-30 DIAGNOSIS — C787 Secondary malignant neoplasm of liver and intrahepatic bile duct: Secondary | ICD-10-CM

## 2020-01-30 DIAGNOSIS — C221 Intrahepatic bile duct carcinoma: Secondary | ICD-10-CM

## 2020-01-30 DIAGNOSIS — R17 Unspecified jaundice: Secondary | ICD-10-CM | POA: Diagnosis not present

## 2020-01-30 DIAGNOSIS — C240 Malignant neoplasm of extrahepatic bile duct: Secondary | ICD-10-CM | POA: Diagnosis not present

## 2020-01-30 LAB — CBC WITH DIFFERENTIAL/PLATELET
Abs Immature Granulocytes: 0.02 10*3/uL (ref 0.00–0.07)
Basophils Absolute: 0 10*3/uL (ref 0.0–0.1)
Basophils Relative: 1 %
Eosinophils Absolute: 0.1 10*3/uL (ref 0.0–0.5)
Eosinophils Relative: 3 %
HCT: 27.9 % — ABNORMAL LOW (ref 36.0–46.0)
Hemoglobin: 9.4 g/dL — ABNORMAL LOW (ref 12.0–15.0)
Immature Granulocytes: 0 %
Lymphocytes Relative: 30 %
Lymphs Abs: 1.7 10*3/uL (ref 0.7–4.0)
MCH: 32.5 pg (ref 26.0–34.0)
MCHC: 33.7 g/dL (ref 30.0–36.0)
MCV: 96.5 fL (ref 80.0–100.0)
Monocytes Absolute: 0.6 10*3/uL (ref 0.1–1.0)
Monocytes Relative: 10 %
Neutro Abs: 3.2 10*3/uL (ref 1.7–7.7)
Neutrophils Relative %: 56 %
Platelets: 157 10*3/uL (ref 150–400)
RBC: 2.89 MIL/uL — ABNORMAL LOW (ref 3.87–5.11)
RDW: 20.9 % — ABNORMAL HIGH (ref 11.5–15.5)
WBC: 5.6 10*3/uL (ref 4.0–10.5)
nRBC: 0 % (ref 0.0–0.2)

## 2020-01-30 LAB — COMPREHENSIVE METABOLIC PANEL
ALT: 45 U/L — ABNORMAL HIGH (ref 0–44)
AST: 72 U/L — ABNORMAL HIGH (ref 15–41)
Albumin: 3.3 g/dL — ABNORMAL LOW (ref 3.5–5.0)
Alkaline Phosphatase: 402 U/L — ABNORMAL HIGH (ref 38–126)
Anion gap: 12 (ref 5–15)
BUN: 12 mg/dL (ref 8–23)
CO2: 21 mmol/L — ABNORMAL LOW (ref 22–32)
Calcium: 9.3 mg/dL (ref 8.9–10.3)
Chloride: 104 mmol/L (ref 98–111)
Creatinine, Ser: 1.43 mg/dL — ABNORMAL HIGH (ref 0.44–1.00)
GFR calc Af Amer: 45 mL/min — ABNORMAL LOW (ref >60)
GFR calc non Af Amer: 39 mL/min — ABNORMAL LOW (ref >60)
Glucose, Bld: 109 mg/dL — ABNORMAL HIGH (ref 70–99)
Potassium: 3.4 mmol/L — ABNORMAL LOW (ref 3.5–5.1)
Sodium: 137 mmol/L (ref 135–145)
Total Bilirubin: 11.3 mg/dL — ABNORMAL HIGH (ref 0.3–1.2)
Total Protein: 7.3 g/dL (ref 6.5–8.1)

## 2020-01-30 LAB — LACTATE DEHYDROGENASE: LDH: 180 U/L (ref 98–192)

## 2020-01-30 LAB — MAGNESIUM: Magnesium: 1.7 mg/dL (ref 1.7–2.4)

## 2020-01-30 LAB — BILIRUBIN, DIRECT: Bilirubin, Direct: 6 mg/dL — ABNORMAL HIGH (ref 0.0–0.2)

## 2020-01-30 NOTE — Patient Instructions (Signed)
[place] at [facility] Discharge Instructions  You were seen today by Dr. Delton Coombes. He went over your recent scan and lab results. You blood work looks better. Once your numbers are back to normal he will talk to you about treatment. He will see you back in 1 week for labs and follow up.   Thank you for choosing [place] at [facility] to provide your oncology and hematology care.  To afford each patient quality time with our provider, please arrive at least 15 minutes before your scheduled appointment time.   If you have a lab appointment with the [facility] please come in thru the  Main Entrance and check in at the main information desk  You need to re-schedule your appointment should you arrive 10 or more minutes late.  We strive to give you quality time with our providers, and arriving late affects you and other patients whose appointments are after yours.  Also, if you no show three or more times for appointments you may be dismissed from the clinic at the providers discretion.     Again, thank you for choosing [facility].  Our hope is that these requests will decrease the amount of time that you wait before being seen by our physicians.       _____________________________________________________________  Should you have questions after your visit to [facility], please contact our office at [phone] between the hours of 8:00 a.m. and 4:30 p.m.  Voicemails left after 4:00 p.m. will not be returned until the following business day.  For prescription refill requests, have your pharmacy contact our office and allow 72 hours.    Cancer Center Support Programs:   > Cancer Support Group  2nd Tuesday of the month 1pm-2pm, Journey Room

## 2020-01-30 NOTE — Assessment & Plan Note (Signed)
1.  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 -CT CAP on 01/04/2020 showed large septated cystic mass arising from the adnexa, increased in volume from 10/17/2019.  Large mass displaces surrounding bowel loops.  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  Stable porta hepatic adenopathy.  Mass along the gallbladder fossa is difficult to measure based on orientation.  Coronal plane measures 2.9 x 2.7, previously 2.7 x 2.5 cm.  Mild but increased intrahepatic biliary dilation.  Several small pulmonary nodules stable. -We will look into if biopsy of the liver lesion is feasible for additional molecular testing.  She will need to start back on some form of systemic therapy when bilirubin normalizes.  2.  Hyperbilirubinemia: -MRCP showed 1.9 cm soft tissue lesion obliterating the common bile duct. -ERCP and plastic stent placement in the right hepatic biliary system by Dr.Rehman on 01/24/2020. -She is feeling very well in terms of energy and appetite.  We reviewed labs from today.  Total bilirubin improved to 11 from 17. -We will continue to monitor with weekly labs.  3.  Peripheral neuropathy: -Numbness in the feet from prior cisplatin exposure with no neuropathic pains.  4.  Folic acid deficiency: -She will continue folic acid 1 mg daily.  5.  Nausea/vomiting: -This is well controlled since the stent placement. -She will continue Compazine as needed.

## 2020-01-30 NOTE — Progress Notes (Signed)
Nicole Ibarra [place], [place] [address]   CLINIC:  Medical Oncology/Hematology  PCP:  Cory Munch, PA-C [place] [address] [phone]   REASON FOR VISIT: Follow-up for cholangiocarcinoma to the liver.  CURRENT THERAPY: Observation.  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[place])   02/22/2019 -  Chemotherapy   The patient had palonosetron (ALOXI) injection 0.25 mg, 0.25 mg, Intravenous,  Once, 10 of 10 cycles Administration: 0.25 mg (02/22/2019), 0.25 mg (03/01/2019), 0.25 mg (03/15/2019), 0.25 mg (03/22/2019), 0.25 mg (04/06/2019), 0.25 mg (04/13/2019), 0.25 mg (04/27/2019), 0.25 mg (05/04/2019), 0.25 mg (05/18/2019), 0.25 mg (05/25/2019), 0.25 mg (06/16/2019), 0.25 mg (06/22/2019), 0.25 mg (07/13/2019), 0.25 mg (07/20/2019), 0.25 mg (08/10/2019), 0.25 mg (08/24/2019), 0.25 mg (09/07/2019), 0.25 mg (09/14/2019) pegfilgrastim-cbqv (UDENYCA) injection 6 mg, 6 mg, Subcutaneous, Once, 10 of 10 cycles Administration: 6 mg (03/02/2019), 6 mg (03/24/2019), 6 mg (05/05/2019), 6 mg (05/26/2019), 6 mg (06/23/2019), 6 mg (07/21/2019), 6 mg (08/25/2019), 6 mg (09/15/2019) CISplatin (PLATINOL) 52 mg in sodium chloride 0.9 % 250 mL chemo infusion, 25 mg/m2 = 52 mg, Intravenous,  Once, 10 of 10 cycles Dose modification: 20 mg/m2 (80 % of original dose 25 mg/m2, Cycle 9, Reason: Other (see comments), Comment: renal insufficiency) Administration: 52 mg (02/22/2019), 52 mg (03/01/2019), 52 mg (03/15/2019), 52 mg (03/22/2019), 52 mg (04/06/2019), 52 mg (04/13/2019), 52 mg (04/27/2019), 52 mg (05/04/2019), 52 mg (05/18/2019), 52 mg (05/25/2019), 52 mg (06/16/2019), 52 mg (06/22/2019), 52 mg (07/13/2019), 52 mg (07/20/2019), 52 mg (08/10/2019), 52 mg (08/24/2019), 42 mg (09/07/2019), 42 mg (09/14/2019) gemcitabine (GEMZAR) 2,000 mg in sodium chloride 0.9 % 250 mL chemo infusion,  2,090 mg, Intravenous,  Once, 10 of 10 cycles Dose modification: 750 mg/m2 (75 % of original dose 1,000 mg/m2, Cycle 4, Reason: Other (see comments), Comment: neutropenia), 750 mg/m2 (75 % of original dose 1,000 mg/m2, Cycle 7, Reason: Other (see comments), Comment: neutripenia) Administration: 2,000 mg (02/22/2019), 2,000 mg (03/01/2019), 2,000 mg (03/15/2019), 2,000 mg (03/22/2019), 2,000 mg (04/06/2019), 2,000 mg (04/13/2019), 2,000 mg (04/27/2019), 1,558 mg (05/04/2019), 2,000 mg (05/18/2019), 2,000 mg (05/25/2019), 2,000 mg (06/16/2019), 2,000 mg (06/22/2019), 2,000 mg (07/13/2019), 1,558 mg (07/20/2019), 2,000 mg (08/10/2019), 2,000 mg (08/24/2019), 2,000 mg (09/07/2019), 2,000 mg (09/14/2019) fosaprepitant (EMEND) 150 mg, dexamethasone (DECADRON) 12 mg in sodium chloride 0.9 % 145 mL IVPB, , Intravenous,  Once, 10 of 10 cycles Administration:  (02/22/2019),  (03/01/2019),  (03/15/2019),  (03/22/2019),  (04/06/2019),  (04/13/2019),  (04/27/2019),  (05/04/2019),  (05/18/2019),  (05/25/2019),  (06/16/2019),  (06/22/2019),  (07/13/2019),  (07/20/2019),  (08/10/2019),  (08/24/2019),  (09/07/2019),  (09/14/2019)  for chemotherapy treatment.       INTERVAL HISTORY:  Ms. Milward 64 y.o. female seen for follow-up of cholangiocarcinoma and hyperbilirubinemia.  She had ERCP done last week on 01/24/2020.  Her energy levels and appetite has improved.  There are reported as 50%.  Nausea also improved.  Skin itching has improved.  Denied any vomiting.  No fevers or chills reported.  REVIEW OF SYSTEMS:  Review of Systems  Gastrointestinal: Positive for nausea.  Neurological: Positive for numbness.  All other systems reviewed and are negative.    PAST MEDICAL/SURGICAL HISTORY:  Past Medical History:  Diagnosis Date  . Anxiety   . Arthritis   . Cancer ([place]) 01/2019   Metastatic cholangiocarcinoma  . Depression   . Hypertension    Past  Surgical History:  Procedure Laterality Date  . BILIARY STENT PLACEMENT N/A 01/24/2020   Procedure:  BILIARY STENT PLACEMENT;  Surgeon: Rogene Houston, MD;  [facility];  Service: Endoscopy;  Laterality: N/A;  . COLONOSCOPY N/A 10/23/2016   Procedure: COLONOSCOPY;  Surgeon: Danie Binder, MD;  Location: [facility];  Service: Endoscopy;  Laterality: N/A;  11:30 Am  . ERCP N/A 01/24/2020   Procedure: ENDOSCOPIC RETROGRADE CHOLANGIOPANCREATOGRAPHY (ERCP);  Surgeon: Rogene Houston, MD;  Location: [facility];  Service: Endoscopy;  Laterality: N/A;  . POLYPECTOMY  10/23/2016   Procedure: POLYPECTOMY;  Surgeon: Danie Binder, MD;  Location: [facility];  Service: Endoscopy;;  sigmoid colon polyp  . PORTACATH PLACEMENT Left 02/16/2019   Procedure: INSERTION PORT-A-CATH (attached catheter in left subclavian);  Surgeon: Virl Cagey, MD;  Location: [facility];  Service: General;  Laterality: Left;  . RT HIP SURGERY    . SPHINCTEROTOMY N/A 01/24/2020   Procedure: SPHINCTEROTOMY;  Surgeon: Rogene Houston, MD;  [facility]: [facility];  Service: Endoscopy;  Laterality: N/A;  . TOTAL HIP ARTHROPLASTY Left 04/07/2016   Procedure: LEFT TOTAL HIP ARTHROPLASTY ANTERIOR APPROACH;  Surgeon: Paralee Cancel, MD;  Location: [facility];  Service: Orthopedics;  Laterality: Left;  Unsuccessful for Spinal, went to General     SOCIAL HISTORY:  Social History   Socioeconomic History  . Marital status: Single    Spouse name: Not on file  . Number of children: Not on file  . Years of education: Not on file  . Highest education level: Not on file  Occupational History  . Not on file  Tobacco Use  . Smoking status: Current Every Day Smoker    Packs/day: 0.25    Years: 42.00    Pack years: 10.50  . Smokeless tobacco: Never Used  Substance and Sexual Activity  . Alcohol use: No  . Drug use: No  . Sexual activity: Not Currently  Other Topics Concern  . Not on file  Social History Narrative  . Not on file   Social Determinants of Health   Financial Resource Strain:   . Difficulty of  Paying Living Expenses: Not on file  Food Insecurity:   . Worried About Charity fundraiser in the Last Year: Not on file  . Ran Out of Food in the Last Year: Not on file  Transportation Needs:   . Lack of Transportation (Medical): Not on file  . Lack of Transportation (Non-Medical): Not on file  Physical Activity:   . Days of Exercise per Week: Not on file  . Minutes of Exercise per Session: Not on file  Stress:   . Feeling of Stress : Not on file  Social Connections:   . Frequency of Communication with Friends and Family: Not on file  . Frequency of Social Gatherings with Friends and Family: Not on file  . Attends Religious Services: Not on file  . Active Member of Clubs or Organizations: Not on file  . Attends Archivist Meetings: Not on file  . Marital Status: Not on file  Intimate Partner Violence:   . Fear of Current or Ex-Partner: Not on file  . Emotionally Abused: Not on file  . Physically Abused: Not on file  . Sexually Abused: Not on file    FAMILY HISTORY:  Family History  Problem Relation Age of Onset  . Hypertension Mother   . Cancer Father   . Hypertension Brother   .  Cancer Brother   . Hypertension Sister   . Cancer Sister     CURRENT MEDICATIONS:  Outpatient Encounter Medications as of 01/30/2020  Medication Sig  . amLODipine (NORVASC) 10 MG tablet Take 1 tablet (10 mg total) by mouth daily.  Marland Kitchen doxazosin (CARDURA) 2 MG tablet Take 2 mg by mouth at bedtime.   . pantoprazole (PROTONIX) 40 MG tablet Take 1 tablet (40 mg total) by mouth daily before breakfast.  . potassium chloride (KLOR-CON) 10 MEQ tablet TAKE 2 TABLETS (20 MEQ TOTAL) BY MOUTH 2 (TWO) TIMES DAILY. (Patient taking differently: Take 20 mEq by mouth 2 (two) times daily. )  . [DISCONTINUED] diazepam (VALIUM) 5 MG tablet Take 1 tablet at 1pm today, take 1 tablet at 145 pm today, may take 1 tablet just prior to exam if still needed today.  . lidocaine (XYLOCAINE) 2 % solution Swish and  swallow 1 tablespoon four times a day prn sore mouth (Patient not taking: Reported on 01/30/2020)  . lidocaine-prilocaine (EMLA) cream Apply to skin over port a cath one hour prior to chemotherapy appointment (Patient not taking: Reported on 01/30/2020)  . oxyCODONE (OXY IR/ROXICODONE) 5 MG immediate release tablet Take 1 tablet (5 mg total) by mouth every 4 (four) hours as needed for severe pain. (Patient not taking: Reported on 01/30/2020)  . prochlorperazine (COMPAZINE) 10 MG tablet TAKE 1 TABLET (10 MG TOTAL) BY MOUTH EVERY 6 (SIX) HOURS AS NEEDED (NAUSEA OR VOMITING). (Patient not taking: Reported on 01/30/2020)  . [DISCONTINUED] prochlorperazine (COMPAZINE) 10 MG tablet TAKE 1 TABLET (10 MG TOTAL) BY MOUTH EVERY 6 (SIX) HOURS AS NEEDED (NAUSEA OR VOMITING).   No facility-administered encounter medications on file as of 01/30/2020.    ALLERGIES:  No Known Allergies   PHYSICAL EXAM:  ECOG Performance status: 1  Vitals:   01/30/20 1612  BP: (!) 142/59  Pulse: 67  Resp: 18  Temp: (!) 97.1 F (36.2 C)  SpO2: 100%   Filed Weights   01/30/20 1612  Weight: 203 lb 8 oz (92.3 kg)    Physical Exam Constitutional:      Appearance: Normal appearance. She is normal weight.  Cardiovascular:     Rate and Rhythm: Normal rate and regular rhythm.     Heart sounds: Normal heart sounds.  Pulmonary:     Effort: Pulmonary effort is normal.     Breath sounds: Normal breath sounds.  Abdominal:     General: Bowel sounds are normal.     Palpations: Abdomen is soft.  Musculoskeletal:        General: Normal range of motion.  Skin:    General: Skin is warm and dry.  Neurological:     Mental Status: She is alert and oriented to person, place, and time. Mental status is at baseline.  Psychiatric:        Mood and Affect: Mood normal.        Behavior: Behavior normal.        Thought Content: Thought content normal.        Judgment: Judgment normal.      LABORATORY DATA:  I have reviewed the  labs as listed.  CBC    Component Value Date/Time   WBC 5.6 01/30/2020 1430   RBC 2.89 (L) 01/30/2020 1430   HGB 9.4 (L) 01/30/2020 1430   HCT 27.9 (L) 01/30/2020 1430   PLT 157 01/30/2020 1430   MCV 96.5 01/30/2020 1430   MCH 32.5 01/30/2020 1430   MCHC 33.7 01/30/2020 1430  RDW 20.9 (H) 01/30/2020 1430   LYMPHSABS 1.7 01/30/2020 1430   MONOABS 0.6 01/30/2020 1430   EOSABS 0.1 01/30/2020 1430   BASOSABS 0.0 01/30/2020 1430   CMP Latest Ref Rng & Units 01/30/2020 01/22/2020 01/17/2020  Glucose 70 - 99 mg/dL 109(H) - 106(H)  BUN 8 - 23 mg/dL 12 - 14  Creatinine 0.44 - 1.00 mg/dL 1.43(H) - 1.59(H)  Sodium 135 - 145 mmol/L 137 - 139  Potassium 3.5 - 5.1 mmol/L 3.4(L) - 3.9  Chloride 98 - 111 mmol/L 104 - 105  CO2 22 - 32 mmol/L 21(L) - 22  Calcium 8.9 - 10.3 mg/dL 9.3 - 9.6  Total Protein 6.5 - 8.1 g/dL 7.3 7.6 7.4  Total Bilirubin 0.3 - 1.2 mg/dL 11.3(H) 17.8(H) 12.4(H)  Alkaline Phos 38 - 126 U/L 402(H) 398(H) 387(H)  AST 15 - 41 U/L 72(H) 58(H) 55(H)  ALT 0 - 44 U/L 45(H) 51(H) 53(H)    I have independently reviewed scans and discussed with the patient.   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 -CT CAP on 01/04/2020 showed large septated cystic mass arising from the adnexa, increased in volume from 10/17/2019.  Large mass displaces surrounding bowel loops.  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  Stable porta hepatic adenopathy.  Mass along the gallbladder fossa is difficult to measure based on orientation.  Coronal plane measures 2.9 x 2.7, previously 2.7 x 2.5 cm.  Mild but increased intrahepatic biliary dilation.  Several small pulmonary nodules stable. -We will look into if biopsy of the liver lesion is feasible for additional molecular testing.  She will need to start back on some form of systemic therapy when  bilirubin normalizes.  2.  Hyperbilirubinemia: -MRCP showed 1.9 cm soft tissue lesion obliterating the common bile duct. -ERCP and plastic stent placement in the right hepatic biliary system by Dr.Rehman on 01/24/2020. -She is feeling very well in terms of energy and appetite.  We reviewed labs from today.  Total bilirubin improved to 11 from 17. -We will continue to monitor with weekly labs.  3.  Peripheral neuropathy: -Numbness in the feet from prior cisplatin exposure with no neuropathic pains.  4.  Folic acid deficiency: -She will continue folic acid 1 mg daily.  5.  Nausea/vomiting: -This is well controlled since the stent placement. -She will continue Compazine as needed.   Orders placed this encounter:  Orders Placed This Encounter  Procedures  . CBC with Differential/Platelet  . Comprehensive metabolic panel  . Bilirubin, direct      Derek Jack, MD [place] [phone]

## 2020-01-31 LAB — CEA: CEA: 23.8 ng/mL — ABNORMAL HIGH (ref 0.0–4.7)

## 2020-02-06 ENCOUNTER — Encounter (HOSPITAL_COMMUNITY): Payer: Self-pay | Admitting: Hematology

## 2020-02-06 ENCOUNTER — Other Ambulatory Visit: Payer: Self-pay

## 2020-02-06 ENCOUNTER — Inpatient Hospital Stay (HOSPITAL_COMMUNITY): Payer: Medicare Other | Attending: Hematology | Admitting: Hematology

## 2020-02-06 ENCOUNTER — Inpatient Hospital Stay (HOSPITAL_COMMUNITY): Payer: Medicare Other

## 2020-02-06 VITALS — BP 121/55 | HR 79 | Temp 96.8°F | Resp 19 | Wt 200.8 lb

## 2020-02-06 DIAGNOSIS — R599 Enlarged lymph nodes, unspecified: Secondary | ICD-10-CM | POA: Insufficient documentation

## 2020-02-06 DIAGNOSIS — E538 Deficiency of other specified B group vitamins: Secondary | ICD-10-CM | POA: Diagnosis not present

## 2020-02-06 DIAGNOSIS — R1031 Right lower quadrant pain: Secondary | ICD-10-CM | POA: Insufficient documentation

## 2020-02-06 DIAGNOSIS — C221 Intrahepatic bile duct carcinoma: Secondary | ICD-10-CM

## 2020-02-06 DIAGNOSIS — R110 Nausea: Secondary | ICD-10-CM | POA: Diagnosis not present

## 2020-02-06 DIAGNOSIS — R531 Weakness: Secondary | ICD-10-CM | POA: Diagnosis not present

## 2020-02-06 DIAGNOSIS — N189 Chronic kidney disease, unspecified: Secondary | ICD-10-CM | POA: Insufficient documentation

## 2020-02-06 DIAGNOSIS — G629 Polyneuropathy, unspecified: Secondary | ICD-10-CM | POA: Diagnosis not present

## 2020-02-06 DIAGNOSIS — Z8249 Family history of ischemic heart disease and other diseases of the circulatory system: Secondary | ICD-10-CM | POA: Insufficient documentation

## 2020-02-06 DIAGNOSIS — F17210 Nicotine dependence, cigarettes, uncomplicated: Secondary | ICD-10-CM | POA: Diagnosis not present

## 2020-02-06 DIAGNOSIS — C787 Secondary malignant neoplasm of liver and intrahepatic bile duct: Secondary | ICD-10-CM | POA: Insufficient documentation

## 2020-02-06 DIAGNOSIS — R7401 Elevation of levels of liver transaminase levels: Secondary | ICD-10-CM | POA: Diagnosis not present

## 2020-02-06 DIAGNOSIS — Z79899 Other long term (current) drug therapy: Secondary | ICD-10-CM | POA: Insufficient documentation

## 2020-02-06 DIAGNOSIS — Z809 Family history of malignant neoplasm, unspecified: Secondary | ICD-10-CM | POA: Diagnosis not present

## 2020-02-06 DIAGNOSIS — R918 Other nonspecific abnormal finding of lung field: Secondary | ICD-10-CM | POA: Diagnosis not present

## 2020-02-06 DIAGNOSIS — Z8719 Personal history of other diseases of the digestive system: Secondary | ICD-10-CM | POA: Insufficient documentation

## 2020-02-06 DIAGNOSIS — R112 Nausea with vomiting, unspecified: Secondary | ICD-10-CM | POA: Diagnosis not present

## 2020-02-06 DIAGNOSIS — R200 Anesthesia of skin: Secondary | ICD-10-CM | POA: Insufficient documentation

## 2020-02-06 DIAGNOSIS — R17 Unspecified jaundice: Secondary | ICD-10-CM

## 2020-02-06 LAB — COMPREHENSIVE METABOLIC PANEL
ALT: 50 U/L — ABNORMAL HIGH (ref 0–44)
AST: 61 U/L — ABNORMAL HIGH (ref 15–41)
Albumin: 3.2 g/dL — ABNORMAL LOW (ref 3.5–5.0)
Alkaline Phosphatase: 415 U/L — ABNORMAL HIGH (ref 38–126)
Anion gap: 11 (ref 5–15)
BUN: 13 mg/dL (ref 8–23)
CO2: 24 mmol/L (ref 22–32)
Calcium: 9.2 mg/dL (ref 8.9–10.3)
Chloride: 102 mmol/L (ref 98–111)
Creatinine, Ser: 1.51 mg/dL — ABNORMAL HIGH (ref 0.44–1.00)
GFR calc Af Amer: 42 mL/min — ABNORMAL LOW (ref >60)
GFR calc non Af Amer: 36 mL/min — ABNORMAL LOW (ref >60)
Glucose, Bld: 163 mg/dL — ABNORMAL HIGH (ref 70–99)
Potassium: 3.4 mmol/L — ABNORMAL LOW (ref 3.5–5.1)
Sodium: 137 mmol/L (ref 135–145)
Total Bilirubin: 7.1 mg/dL — ABNORMAL HIGH (ref 0.3–1.2)
Total Protein: 7.4 g/dL (ref 6.5–8.1)

## 2020-02-06 LAB — CBC WITH DIFFERENTIAL/PLATELET
Abs Immature Granulocytes: 0.01 10*3/uL (ref 0.00–0.07)
Basophils Absolute: 0 10*3/uL (ref 0.0–0.1)
Basophils Relative: 0 %
Eosinophils Absolute: 0.1 10*3/uL (ref 0.0–0.5)
Eosinophils Relative: 3 %
HCT: 29.5 % — ABNORMAL LOW (ref 36.0–46.0)
Hemoglobin: 9.5 g/dL — ABNORMAL LOW (ref 12.0–15.0)
Immature Granulocytes: 0 %
Lymphocytes Relative: 33 %
Lymphs Abs: 1.5 10*3/uL (ref 0.7–4.0)
MCH: 32.6 pg (ref 26.0–34.0)
MCHC: 32.2 g/dL (ref 30.0–36.0)
MCV: 101.4 fL — ABNORMAL HIGH (ref 80.0–100.0)
Monocytes Absolute: 0.3 10*3/uL (ref 0.1–1.0)
Monocytes Relative: 7 %
Neutro Abs: 2.6 10*3/uL (ref 1.7–7.7)
Neutrophils Relative %: 57 %
Platelets: 154 10*3/uL (ref 150–400)
RBC: 2.91 MIL/uL — ABNORMAL LOW (ref 3.87–5.11)
RDW: 19.9 % — ABNORMAL HIGH (ref 11.5–15.5)
WBC: 4.7 10*3/uL (ref 4.0–10.5)
nRBC: 0 % (ref 0.0–0.2)

## 2020-02-06 LAB — BILIRUBIN, DIRECT: Bilirubin, Direct: 3.7 mg/dL — ABNORMAL HIGH (ref 0.0–0.2)

## 2020-02-06 NOTE — Assessment & Plan Note (Addendum)
1.  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 -CT CAP on 12/27/2019 showed large cystic mass in the pelvis.  Stable porta hepatic adenopathy.  Mass along the gallbladder fossa is difficult to measure based on orientation.  Small pulmonary nodules are stable. -I have recommended biopsy of the liver lesion for additional molecular testing.  2.  Hyperbilirubinemia: -MRCP showed 1.9 cm soft tissue lesion obliterating the common bile duct. -ERCP and plastic stent placement in the right hepatic biliary system by Dr. Laural Golden on 01/24/2020. -She continues to feel weak and has occasional nausea. -We reviewed labs from today.  Total bilirubin improved to 7.  Direct bilirubin is 3.7. -AST and ALT are mildly elevated which are improving.  We will plan to repeat her labs in 2 weeks.  3.  CKD: -This is from prior cisplatin therapy.  Creatinine is stable around 1.5.  CBC showed hemoglobin around 9.5.  4.  Folic acid deficiency: -She will continue folic acid 1 mg daily.  5.  Nausea/vomiting: -She has intermittent nausea which is controlled with Compazine.  6.  Peripheral neuropathy: -Numbness in the feet from prior cisplatin exposure with no neuropathic pains.  7.  Right lower quadrant pain: -She had right lower quadrant pain for the past few weeks.  This pain is coming from the pelvic mass. -She is taking hydrocodone which is helping.  Lying down also helps. -We will have to start her on systemic therapy as soon as her bilirubin improves.

## 2020-02-06 NOTE — Progress Notes (Signed)
[place], [place] [address]   CLINIC:  Medical Oncology/Hematology  PCP:  Cory Munch, PA-C [place] [id number] [phone]   REASON FOR VISIT: Follow-up for cholangiocarcinoma to the liver.  CURRENT THERAPY: Observation.  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[place])   02/22/2019 -  Chemotherapy   The patient had palonosetron (ALOXI) injection 0.25 mg, 0.25 mg, Intravenous,  Once, 10 of 10 cycles Administration: 0.25 mg (02/22/2019), 0.25 mg (03/01/2019), 0.25 mg (03/15/2019), 0.25 mg (03/22/2019), 0.25 mg (04/06/2019), 0.25 mg (04/13/2019), 0.25 mg (04/27/2019), 0.25 mg (05/04/2019), 0.25 mg (05/18/2019), 0.25 mg (05/25/2019), 0.25 mg (06/16/2019), 0.25 mg (06/22/2019), 0.25 mg (07/13/2019), 0.25 mg (07/20/2019), 0.25 mg (08/10/2019), 0.25 mg (08/24/2019), 0.25 mg (09/07/2019), 0.25 mg (09/14/2019) pegfilgrastim-cbqv (UDENYCA) injection 6 mg, 6 mg, Subcutaneous, Once, 10 of 10 cycles Administration: 6 mg (03/02/2019), 6 mg (03/24/2019), 6 mg (05/05/2019), 6 mg (05/26/2019), 6 mg (06/23/2019), 6 mg (07/21/2019), 6 mg (08/25/2019), 6 mg (09/15/2019) CISplatin (PLATINOL) 52 mg in sodium chloride 0.9 % 250 mL chemo infusion, 25 mg/m2 = 52 mg, Intravenous,  Once, 10 of 10 cycles Dose modification: 20 mg/m2 (80 % of original dose 25 mg/m2, Cycle 9, Reason: Other (see comments), Comment: renal insufficiency) Administration: 52 mg (02/22/2019), 52 mg (03/01/2019), 52 mg (03/15/2019), 52 mg (03/22/2019), 52 mg (04/06/2019), 52 mg (04/13/2019), 52 mg (04/27/2019), 52 mg (05/04/2019), 52 mg (05/18/2019), 52 mg (05/25/2019), 52 mg (06/16/2019), 52 mg (06/22/2019), 52 mg (07/13/2019), 52 mg (07/20/2019), 52 mg (08/10/2019), 52 mg (08/24/2019), 42 mg (09/07/2019), 42 mg (09/14/2019) gemcitabine (GEMZAR) 2,000 mg in sodium chloride 0.9 % 250 mL chemo infusion,  2,090 mg, Intravenous,  Once, 10 of 10 cycles Dose modification: 750 mg/m2 (75 % of original dose 1,000 mg/m2, Cycle 4, Reason: Other (see comments), Comment: neutropenia), 750 mg/m2 (75 % of original dose 1,000 mg/m2, Cycle 7, Reason: Other (see comments), Comment: neutripenia) Administration: 2,000 mg (02/22/2019), 2,000 mg (03/01/2019), 2,000 mg (03/15/2019), 2,000 mg (03/22/2019), 2,000 mg (04/06/2019), 2,000 mg (04/13/2019), 2,000 mg (04/27/2019), 1,558 mg (05/04/2019), 2,000 mg (05/18/2019), 2,000 mg (05/25/2019), 2,000 mg (06/16/2019), 2,000 mg (06/22/2019), 2,000 mg (07/13/2019), 1,558 mg (07/20/2019), 2,000 mg (08/10/2019), 2,000 mg (08/24/2019), 2,000 mg (09/07/2019), 2,000 mg (09/14/2019) fosaprepitant (EMEND) 150 mg, dexamethasone (DECADRON) 12 mg in sodium chloride 0.9 % 145 mL IVPB, , Intravenous,  Once, 10 of 10 cycles Administration:  (02/22/2019),  (03/01/2019),  (03/15/2019),  (03/22/2019),  (04/06/2019),  (04/13/2019),  (04/27/2019),  (05/04/2019),  (05/18/2019),  (05/25/2019),  (06/16/2019),  (06/22/2019),  (07/13/2019),  (07/20/2019),  (08/10/2019),  (08/24/2019),  (09/07/2019),  (09/14/2019)  for chemotherapy treatment.       INTERVAL HISTORY:  Nicole Ibarra 64 y.o. female seen for follow-up of cholangiocarcinoma and hyperbilirubinemia.  Reports appetite and energy levels are 25%.  Right lower quadrant pain is intermittent.  She is taking hydrocodone which is helping.  Denies any fevers or chills.  Lying down also helps with the pain.  Nausea was controlled well with Compazine.  REVIEW OF SYSTEMS:  Review of Systems  Gastrointestinal: Positive for nausea.  Neurological: Positive for numbness.  All other systems reviewed and are negative.    PAST MEDICAL/SURGICAL HISTORY:  Past Medical History:  Diagnosis Date  . Anxiety   . Arthritis   . Cancer ([facility]) 01/2019   Metastatic cholangiocarcinoma  . Depression   . Hypertension  Past Surgical History:  Procedure Laterality Date  . BILIARY STENT PLACEMENT N/A  01/24/2020   Procedure: BILIARY STENT PLACEMENT;  Surgeon: Rogene Houston, MD;  [facility];  Service: Endoscopy;  Laterality: N/A;  . COLONOSCOPY N/A 10/23/2016   Procedure: COLONOSCOPY;  Surgeon: Danie Binder, MD;  [facility];  Service: Endoscopy;  Laterality: N/A;  11:30 Am  . ERCP N/A 01/24/2020   Procedure: ENDOSCOPIC RETROGRADE CHOLANGIOPANCREATOGRAPHY (ERCP);  Surgeon: Rogene Houston, MD;  [facility];  Service: Endoscopy;  Laterality: N/A;  . POLYPECTOMY  10/23/2016   Procedure: POLYPECTOMY;  Surgeon: Danie Binder, MD;  [facility];  Service: Endoscopy;;  sigmoid colon polyp  . PORTACATH PLACEMENT Left 02/16/2019   Procedure: INSERTION PORT-A-CATH (attached catheter in left subclavian);  Surgeon: Virl Cagey, MD;  Location: [facility];  Service: General;  Laterality: Left;  . RT HIP SURGERY    . SPHINCTEROTOMY N/A 01/24/2020   Procedure: SPHINCTEROTOMY;  Surgeon: Rogene Houston, MD;  Location[facility];  Service: Endoscopy;  Laterality: N/A;  . TOTAL HIP ARTHROPLASTY Left 04/07/2016   Procedure: LEFT TOTAL HIP ARTHROPLASTY ANTERIOR APPROACH;  Surgeon: Paralee Cancel, MD;  Location: [facility];  Service: Orthopedics;  Laterality: Left;  Unsuccessful for Spinal, went to General     SOCIAL HISTORY:  Social History   Socioeconomic History  . Marital status: Single    Spouse name: Not on file  . Number of children: Not on file  . Years of education: Not on file  . Highest education level: Not on file  Occupational History  . Not on file  Tobacco Use  . Smoking status: Current Every Day Smoker    Packs/day: 0.25    Years: 42.00    Pack years: 10.50  . Smokeless tobacco: Never Used  Substance and Sexual Activity  . Alcohol use: No  . Drug use: No  . Sexual activity: Not Currently  Other Topics Concern  . Not on file  Social History Narrative  . Not on file   Social Determinants of Health   Financial Resource Strain:    . Difficulty of Paying Living Expenses: Not on file  Food Insecurity:   . Worried About Charity fundraiser in the Last Year: Not on file  . Ran Out of Food in the Last Year: Not on file  Transportation Needs:   . Lack of Transportation (Medical): Not on file  . Lack of Transportation (Non-Medical): Not on file  Physical Activity:   . Days of Exercise per Week: Not on file  . Minutes of Exercise per Session: Not on file  Stress:   . Feeling of Stress : Not on file  Social Connections:   . Frequency of Communication with Friends and Family: Not on file  . Frequency of Social Gatherings with Friends and Family: Not on file  . Attends Religious Services: Not on file  . Active Member of Clubs or Organizations: Not on file  . Attends Archivist Meetings: Not on file  . Marital Status: Not on file  Intimate Partner Violence:   . Fear of Current or Ex-Partner: Not on file  . Emotionally Abused: Not on file  . Physically Abused: Not on file  . Sexually Abused: Not on file    FAMILY HISTORY:  Family History  Problem Relation Age of Onset  . Hypertension Mother   . Cancer Father   . Hypertension Brother   .  Cancer Brother   . Hypertension Sister   . Cancer Sister     CURRENT MEDICATIONS:  Outpatient Encounter Medications as of 02/06/2020  Medication Sig  . amLODipine (NORVASC) 10 MG tablet Take 1 tablet (10 mg total) by mouth daily.  Marland Kitchen doxazosin (CARDURA) 2 MG tablet Take 2 mg by mouth at bedtime.   . pantoprazole (PROTONIX) 40 MG tablet Take 1 tablet (40 mg total) by mouth daily before breakfast.  . potassium chloride (KLOR-CON) 10 MEQ tablet TAKE 2 TABLETS (20 MEQ TOTAL) BY MOUTH 2 (TWO) TIMES DAILY. (Patient taking differently: Take 20 mEq by mouth 2 (two) times daily. )  . lidocaine (XYLOCAINE) 2 % solution Swish and swallow 1 tablespoon four times a day prn sore mouth (Patient not taking: Reported on 01/30/2020)  . lidocaine-prilocaine (EMLA) cream Apply to skin over  port a cath one hour prior to chemotherapy appointment (Patient not taking: Reported on 01/30/2020)  . oxyCODONE (OXY IR/ROXICODONE) 5 MG immediate release tablet Take 1 tablet (5 mg total) by mouth every 4 (four) hours as needed for severe pain. (Patient not taking: Reported on 01/30/2020)  . prochlorperazine (COMPAZINE) 10 MG tablet TAKE 1 TABLET (10 MG TOTAL) BY MOUTH EVERY 6 (SIX) HOURS AS NEEDED (NAUSEA OR VOMITING). (Patient not taking: Reported on 01/30/2020)  . [DISCONTINUED] prochlorperazine (COMPAZINE) 10 MG tablet TAKE 1 TABLET (10 MG TOTAL) BY MOUTH EVERY 6 (SIX) HOURS AS NEEDED (NAUSEA OR VOMITING).   No facility-administered encounter medications on file as of 02/06/2020.    ALLERGIES:  No Known Allergies   PHYSICAL EXAM:  ECOG Performance status: 1  Vitals:   02/06/20 1412  BP: (!) 121/55  Pulse: 79  Resp: 19  Temp: (!) 96.8 F (36 C)  SpO2: 100%   Filed Weights   02/06/20 1412  Weight: 200 lb 12.8 oz (91.1 kg)    Physical Exam Constitutional:      Appearance: Normal appearance. She is normal weight.  Cardiovascular:     Rate and Rhythm: Normal rate and regular rhythm.     Heart sounds: Normal heart sounds.  Pulmonary:     Effort: Pulmonary effort is normal.     Breath sounds: Normal breath sounds.  Abdominal:     General: Bowel sounds are normal.     Palpations: Abdomen is soft.  Musculoskeletal:        General: Normal range of motion.  Skin:    General: Skin is warm and dry.  Neurological:     Mental Status: She is alert and oriented to person, place, and time. Mental status is at baseline.  Psychiatric:        Mood and Affect: Mood normal.        Behavior: Behavior normal.        Thought Content: Thought content normal.        Judgment: Judgment normal.      LABORATORY DATA:  I have reviewed the labs as listed.  CBC    Component Value Date/Time   WBC 4.7 02/06/2020 1347   RBC 2.91 (L) 02/06/2020 1347   HGB 9.5 (L) 02/06/2020 1347   HCT 29.5  (L) 02/06/2020 1347   PLT 154 02/06/2020 1347   MCV 101.4 (H) 02/06/2020 1347   MCH 32.6 02/06/2020 1347   MCHC 32.2 02/06/2020 1347   RDW 19.9 (H) 02/06/2020 1347   LYMPHSABS 1.5 02/06/2020 1347   MONOABS 0.3 02/06/2020 1347   EOSABS 0.1 02/06/2020 1347   BASOSABS 0.0 02/06/2020 1347  CMP Latest Ref Rng & Units 02/06/2020 01/30/2020 01/22/2020  Glucose 70 - 99 mg/dL 163(H) 109(H) -  BUN 8 - 23 mg/dL 13 12 -  Creatinine 0.44 - 1.00 mg/dL 1.51(H) 1.43(H) -  Sodium 135 - 145 mmol/L 137 137 -  Potassium 3.5 - 5.1 mmol/L 3.4(L) 3.4(L) -  Chloride 98 - 111 mmol/L 102 104 -  CO2 22 - 32 mmol/L 24 21(L) -  Calcium 8.9 - 10.3 mg/dL 9.2 9.3 -  Total Protein 6.5 - 8.1 g/dL 7.4 7.3 7.6  Total Bilirubin 0.3 - 1.2 mg/dL 7.1(H) 11.3(H) 17.8(H)  Alkaline Phos 38 - 126 U/L 415(H) 402(H) 398(H)  AST 15 - 41 U/L 61(H) 72(H) 58(H)  ALT 0 - 44 U/L 50(H) 45(H) 51(H)    I have independently reviewed scans and discussed the patient.   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 -CT CAP on 12/27/2019 showed large cystic mass in the pelvis.  Stable porta hepatic adenopathy.  Mass along the gallbladder fossa is difficult to measure based on orientation.  Small pulmonary nodules are stable. -I have recommended biopsy of the liver lesion for additional molecular testing.  2.  Hyperbilirubinemia: -MRCP showed 1.9 cm soft tissue lesion obliterating the common bile duct. -ERCP and plastic stent placement in the right hepatic biliary system by Dr. Laural Golden on 01/24/2020. -She continues to feel weak and has occasional nausea. -We reviewed labs from today.  Total bilirubin improved to 7.  Direct bilirubin is 3.7. -AST and ALT are mildly elevated which are improving.  We will plan to repeat her labs in 2 weeks.  3.  CKD: -This is from prior cisplatin therapy.  Creatinine  is stable around 1.5.  CBC showed hemoglobin around 9.5.  4.  Folic acid deficiency: -She will continue folic acid 1 mg daily.  5.  Nausea/vomiting: -She has intermittent nausea which is controlled with Compazine.  6.  Peripheral neuropathy: -Numbness in the feet from prior cisplatin exposure with no neuropathic pains.  7.  Right lower quadrant pain: -She had right lower quadrant pain for the past few weeks.  This pain is coming from the pelvic mass. -She is taking hydrocodone which is helping.  Lying down also helps. -We will have to start her on systemic therapy as soon as her bilirubin improves.   Orders placed this encounter:  Orders Placed This Encounter  Procedures  . CT Biopsy      Derek Jack, MD [place] [phone]

## 2020-02-06 NOTE — Patient Instructions (Addendum)
[place] at [facility] Discharge Instructions  You were seen today by Dr. Delton Coombes. He went over your recent lab and scan results. He will get you scheduled for a Liver Biopsy. He will see you back in 2 weeks for labs and follow up.   Thank you for choosing [place] at [facility] to provide your oncology and hematology care.  To afford each patient quality time with our provider, please arrive at least 15 minutes before your scheduled appointment time.   If you have a lab appointment with the [facility] please come in thru the  Main Entrance and check in at the main information desk  You need to re-schedule your appointment should you arrive 10 or more minutes late.  We strive to give you quality time with our providers, and arriving late affects you and other patients whose appointments are after yours.  Also, if you no show three or more times for appointments you may be dismissed from the clinic at the providers discretion.     Again, thank you for choosing [facility].  Our hope is that these requests will decrease the amount of time that you wait before being seen by our physicians.       _____________________________________________________________  Should you have questions after your visit to [facility], please contact our office at [phone] between the hours of 8:00 a.m. and 4:30 p.m.  Voicemails left after 4:00 p.m. will not be returned until the following business day.  For prescription refill requests, have your pharmacy contact our office and allow 72 hours.    Cancer Center Support Programs:   > Cancer Support Group  2nd Tuesday of the month 1pm-2pm, Journey Room

## 2020-02-07 ENCOUNTER — Encounter (HOSPITAL_COMMUNITY): Payer: Self-pay | Admitting: Radiology

## 2020-02-07 NOTE — Progress Notes (Signed)
Nicole Ibarra Female, 64 y.o., 1956/01/23 MRN:  [id number] Phone:  [phone] [name]) PCP:  [facility] Coverage:  [place] [facility] 02/12/2020 at 9:30 AM  RE: CT Biopsy Received: Today Message Contents  [name], MD  [name]  Approved for US guided core biopsy. Lesion is adjacent to the GB in segment 5. See CT from 1/28 Image 57 Se: 2, and MRI from 2/12 image 35 Se: 18.   Suspected metastatic cholangiocarcinoma.   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

## 2020-02-08 ENCOUNTER — Other Ambulatory Visit (HOSPITAL_COMMUNITY): Payer: Self-pay | Admitting: *Deleted

## 2020-02-08 DIAGNOSIS — C787 Secondary malignant neoplasm of liver and intrahepatic bile duct: Secondary | ICD-10-CM

## 2020-02-08 DIAGNOSIS — C221 Intrahepatic bile duct carcinoma: Secondary | ICD-10-CM

## 2020-02-09 ENCOUNTER — Ambulatory Visit (INDEPENDENT_AMBULATORY_CARE_PROVIDER_SITE_OTHER): Payer: Medicare Other | Admitting: Internal Medicine

## 2020-02-12 ENCOUNTER — Encounter (INDEPENDENT_AMBULATORY_CARE_PROVIDER_SITE_OTHER): Payer: Self-pay | Admitting: Internal Medicine

## 2020-02-12 ENCOUNTER — Other Ambulatory Visit: Payer: Self-pay

## 2020-02-12 ENCOUNTER — Ambulatory Visit (INDEPENDENT_AMBULATORY_CARE_PROVIDER_SITE_OTHER): Payer: Medicare Other | Admitting: Internal Medicine

## 2020-02-12 VITALS — BP 138/73 | HR 74 | Temp 97.3°F | Ht 64.0 in | Wt 199.3 lb

## 2020-02-12 DIAGNOSIS — C801 Malignant (primary) neoplasm, unspecified: Secondary | ICD-10-CM | POA: Diagnosis not present

## 2020-02-12 DIAGNOSIS — K831 Obstruction of bile duct: Secondary | ICD-10-CM

## 2020-02-12 DIAGNOSIS — K5900 Constipation, unspecified: Secondary | ICD-10-CM | POA: Diagnosis not present

## 2020-02-12 DIAGNOSIS — R140 Abdominal distension (gaseous): Secondary | ICD-10-CM | POA: Diagnosis not present

## 2020-02-12 MED ORDER — POLYETHYLENE GLYCOL 3350 17 GM/SCOOP PO POWD: 17.0000 g | Freq: Every day | ORAL | 0 refills | Status: AC

## 2020-02-12 MED ORDER — SIMETHICONE 180 MG PO CAPS: 180.0000 mg | ORAL_CAPSULE | Freq: Three times a day (TID) | ORAL | 0 refills | Status: AC | PRN

## 2020-02-12 NOTE — Patient Instructions (Signed)
Take Phazyme 3 times a day for few days and if it helps use on as-needed basis. Can titrate polyethylene glycol dose from half a cup to 1-1/2 cup 4/day. These call office if polyethylene glycol does not help alleviate constipation.

## 2020-02-12 NOTE — Progress Notes (Signed)
Presenting complaint;  Bloating and constipation.  Database and subjective:  Patient is 64 year old Afro-American female who has a history of cholangiocarcinoma whom I saw in the office on 01/21/2019 for obstructive jaundice.  She underwent ERCP with biliary stenting on 01/24/2020.  She had LFTs by Dr. Delton Coombes week later with significant improvement in cholestasis.  She is scheduled to have more blood work later this week or next.  She is scheduled to undergo biopsy later this week.  Patient says itching has resolved.  She is passing brown stools.  Urine color is back to normal.  She is not having any abdominal pain nausea or vomiting.  She complains of bloating.  She is less bloated when she wakes in the morning.  As the day progresses bloating is worse.  She does get some relief when her bowels move where she passes flatus.  She has been constipated.  She passes an average of 2 stools per week.  She denies melena or rectal bleeding.  She says she has had intermittent nausea and vomiting and last episode was 2 weeks ago.  She denies fevers or chills.  Her appetite is not good but she has not lost any weight since she was last seen in the office on 01/22/2020.  Current Medications: Outpatient Encounter Medications as of 02/12/2020  Medication Sig  . amLODipine (NORVASC) 10 MG tablet Take 1 tablet (10 mg total) by mouth daily.  Marland Kitchen doxazosin (CARDURA) 2 MG tablet Take 2 mg by mouth at bedtime.   . lidocaine (XYLOCAINE) 2 % solution Swish and swallow 1 tablespoon four times a day prn sore mouth  . lidocaine-prilocaine (EMLA) cream Apply to skin over port a cath one hour prior to chemotherapy appointment  . oxyCODONE (OXY IR/ROXICODONE) 5 MG immediate release tablet Take 1 tablet (5 mg total) by mouth every 4 (four) hours as needed for severe pain.  . pantoprazole (PROTONIX) 40 MG tablet Take 1 tablet (40 mg total) by mouth daily before breakfast.  . potassium chloride (KLOR-CON) 10 MEQ tablet TAKE 2  TABLETS (20 MEQ TOTAL) BY MOUTH 2 (TWO) TIMES DAILY. (Patient taking differently: Take 20 mEq by mouth 2 (two) times daily. )  . prochlorperazine (COMPAZINE) 10 MG tablet TAKE 1 TABLET (10 MG TOTAL) BY MOUTH EVERY 6 (SIX) HOURS AS NEEDED (NAUSEA OR VOMITING).  . [DISCONTINUED] prochlorperazine (COMPAZINE) 10 MG tablet TAKE 1 TABLET (10 MG TOTAL) BY MOUTH EVERY 6 (SIX) HOURS AS NEEDED (NAUSEA OR VOMITING).   No facility-administered encounter medications on file as of 02/12/2020.     Objective: Blood pressure 138/73, pulse 74, temperature (!) 97.3 F (36.3 C), temperature source Temporal, height 5' 4" (1.626 m), weight 199 lb 4.8 oz (90.4 kg). Patient is alert and in no acute distress. She is wearing a facial mask. Conjunctiva is pink. Sclera is mildly icteric Oropharyngeal mucosa is normal. No neck masses or thyromegaly noted. Cardiac exam with regular rhythm normal S1 and S2. No murmur or gallop noted. Lungs are clear to auscultation. Abdomen is symmetrical.  Bowel sounds are normal.  On palpation she has fullness in lower abdomen but there is no definite mass or tenderness.  No hepatosplenomegaly or masses. No LE edema or clubbing noted.  Labs/studies Results:  CBC Latest Ref Rng & Units 02/06/2020 01/30/2020 01/17/2020  WBC 4.0 - 10.5 K/uL 4.7 5.6 5.2  Hemoglobin 12.0 - 15.0 g/dL 9.5(L) 9.4(L) 10.3(L)  Hematocrit 36.0 - 46.0 % 29.5(L) 27.9(L) 30.7(L)  Platelets 150 - 400 K/uL 154 157  179    CMP Latest Ref Rng & Units 02/06/2020 01/30/2020 01/22/2020  Glucose 70 - 99 mg/dL 163(H) 109(H) -  BUN 8 - 23 mg/dL 13 12 -  Creatinine 0.44 - 1.00 mg/dL 1.51(H) 1.43(H) -  Sodium 135 - 145 mmol/L 137 137 -  Potassium 3.5 - 5.1 mmol/L 3.4(L) 3.4(L) -  Chloride 98 - 111 mmol/L 102 104 -  CO2 22 - 32 mmol/L 24 21(L) -  Calcium 8.9 - 10.3 mg/dL 9.2 9.3 -  Total Protein 6.5 - 8.1 g/dL 7.4 7.3 7.6  Total Bilirubin 0.3 - 1.2 mg/dL 7.1(H) 11.3(H) 17.8(H)  Alkaline Phos 38 - 126 U/L 415(H) 402(H) 398(H)   AST 15 - 41 U/L 61(H) 72(H) 58(H)  ALT 0 - 44 U/L 50(H) 45(H) 51(H)    Hepatic Function Latest Ref Rng & Units 02/06/2020 01/30/2020 01/22/2020  Total Protein 6.5 - 8.1 g/dL 7.4 7.3 7.6  Albumin 3.5 - 5.0 g/dL 3.2(L) 3.3(L) 3.3(L)  AST 15 - 41 U/L 61(H) 72(H) 58(H)  ALT 0 - 44 U/L 50(H) 45(H) 51(H)  Alk Phosphatase 38 - 126 U/L 415(H) 402(H) 398(H)  Total Bilirubin 0.3 - 1.2 mg/dL 7.1(H) 11.3(H) 17.8(H)  Bilirubin, Direct 0.0 - 0.2 mg/dL 3.7(H) 6.0(H) 10.9(H)    Abdominal pelvic CT from 01/04/2020 and MR from 01/19/2020 pertinent for obstruction at the level of proximal common hepatic duct with dilation upstream, adenopathy in hepatoduodenal ligament very large septated cystic lesion in the pelvis.   Assessment:  #1.  Obstructive jaundice secondary to metastatic cholangiocarcinoma.  She had biliary stenting on 01/24/2020.  Bilirubin 2 days prior to procedure was around 18 and suspect probably was over 20 on the day of procedure.  Cholestasis has improved significantly.  She is not having pruritus anymore.  She is scheduled to undergo another biopsy before next step in therapy considered.  She was diagnosed with this condition in February last year when she had biopsy.  #2.  Bloating.  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  This has not been further work-up since she has more pressing problem in the form of metastatic cholangiocarcinoma. We will discuss with Dr. Delton Coombes.  #3.  Chronic constipation.  She is up-to-date on CRC screening.  Last colonoscopy was in November 2017.  Plan:  Phazyme 180 mg p.o. 3 times daily as needed. Polyethylene glycol 17 g by mouth daily at bedtime.  Patient can titrate the dose up or down.  She can take half a scoop to scoop and a half daily. Office visit in 2 months.

## 2020-02-14 ENCOUNTER — Other Ambulatory Visit: Payer: Self-pay | Admitting: Radiology

## 2020-02-15 ENCOUNTER — Ambulatory Visit (HOSPITAL_COMMUNITY)
Admission: RE | Admit: 2020-02-15 | Discharge: 2020-02-15 | Disposition: A | Discharge status: Home / Self Care | Payer: Medicare Other | Source: Ambulatory Visit | Attending: Hematology | Admitting: Hematology

## 2020-02-15 ENCOUNTER — Other Ambulatory Visit: Payer: Self-pay

## 2020-02-15 ENCOUNTER — Other Ambulatory Visit (HOSPITAL_COMMUNITY): Payer: Self-pay | Admitting: Hematology

## 2020-02-15 DIAGNOSIS — C221 Intrahepatic bile duct carcinoma: Secondary | ICD-10-CM

## 2020-02-15 DIAGNOSIS — C787 Secondary malignant neoplasm of liver and intrahepatic bile duct: Secondary | ICD-10-CM | POA: Insufficient documentation

## 2020-02-15 LAB — CBC
HCT: 28.2 % — ABNORMAL LOW (ref 36.0–46.0)
Hemoglobin: 9.5 g/dL — ABNORMAL LOW (ref 12.0–15.0)
MCH: 33.8 pg (ref 26.0–34.0)
MCHC: 33.7 g/dL (ref 30.0–36.0)
MCV: 100.4 fL — ABNORMAL HIGH (ref 80.0–100.0)
Platelets: 157 10*3/uL (ref 150–400)
RBC: 2.81 MIL/uL — ABNORMAL LOW (ref 3.87–5.11)
RDW: 17.1 % — ABNORMAL HIGH (ref 11.5–15.5)
WBC: 5.1 10*3/uL (ref 4.0–10.5)
nRBC: 0 % (ref 0.0–0.2)

## 2020-02-15 LAB — PROTIME-INR
INR: 1.1 (ref 0.8–1.2)
Prothrombin Time: 13.9 seconds (ref 11.4–15.2)

## 2020-02-15 MED ORDER — SODIUM CHLORIDE 0.9 % IV SOLN: INTRAVENOUS | Status: DC

## 2020-02-15 MED ORDER — FENTANYL CITRATE (PF) 100 MCG/2ML IJ SOLN
INTRAMUSCULAR | Status: AC
  Filled 2020-02-15: qty 2

## 2020-02-15 MED ORDER — GELATIN ABSORBABLE 12-7 MM EX MISC
CUTANEOUS | Status: AC
  Filled 2020-02-15: qty 1

## 2020-02-15 MED ORDER — MIDAZOLAM HCL 2 MG/2ML IJ SOLN
INTRAMUSCULAR | Status: AC
  Filled 2020-02-15: qty 2

## 2020-02-15 MED ORDER — LIDOCAINE-EPINEPHRINE 1 %-1:100000 IJ SOLN
INTRAMUSCULAR | Status: AC
  Filled 2020-02-15: qty 1

## 2020-02-15 NOTE — Sedation Documentation (Signed)
MD unable to find lesion on ultrasound.  No sedation administered.  Patient discharged from IR and taken to waiting family with wheelchair.  Patient alert and oriented.

## 2020-02-15 NOTE — H&P (Signed)
Referring Physician(s): Katragadda,Sreedhar  Supervising Physician: Sandi Mariscal  Patient Status:  [facility] OP  Chief Complaint:  "I'm having a biopsy"  Subjective: Patient familiar to IR service from right liver lesion (adjacent to gallbladder) biopsy on 01/31/2019 which yielded adenocarcinoma.  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  Recent imaging has revealed: 1. 1.9 x 1.2 cm soft tissue lesion obliterates the common duct just distal to the left and right hepatic duct confluence. This obliterates the common duct along a distance of 1.3 cm. Distal common duct and common bile duct are patent. Imaging features compatible with obstruction by the patient's known cholangiocarcinoma 2. The abnormal soft tissue seen obliterating the common duct involves the gallbladder wall near the neck and extends posteriorly over the neck of the gallbladder into the right hepatic lobe just posterior to the gallbladder fossa. 3. Similar appearance of lymphadenopathy in the hepatoduodenal ligament. 4. Multiple hepatic cysts. 5. Large septated cystic lesion in the pelvis, similar to recent CT scan where it was better visualized.  She presents today for image guided segment 5 liver lesion biopsy for further evaluation.  She currently denies fever, headache, chest pain, dyspnea, cough, back pain, nausea, vomiting or bleeding.  She does have some diffuse abdominal discomfort/ slight distention.   Past Medical History:  Diagnosis Date  . Anxiety   . Arthritis   . Cancer ([place]) 01/2019   Metastatic cholangiocarcinoma  . Depression   . Hypertension    Past Surgical History:  Procedure Laterality Date  . BILIARY STENT PLACEMENT N/A 01/24/2020   Procedure: BILIARY STENT PLACEMENT;  Surgeon: Rogene Houston, MD;  [facility];  Service: Endoscopy;  Laterality: N/A;    . COLONOSCOPY N/A 10/23/2016   Procedure: COLONOSCOPY;  Surgeon: Danie Binder, MD;  [facility];  Service: Endoscopy;  Laterality: N/A;  11:30 Am  . ERCP N/A 01/24/2020   Procedure: ENDOSCOPIC RETROGRADE CHOLANGIOPANCREATOGRAPHY (ERCP);  Surgeon: Rogene Houston, MD;  [facility];  Service: Endoscopy;  Laterality: N/A;  . POLYPECTOMY  10/23/2016   Procedure: POLYPECTOMY;  Surgeon: Danie Binder, MD;  [facility];  Service: Endoscopy;;  sigmoid colon polyp  . PORTACATH PLACEMENT Left 02/16/2019   Procedure: INSERTION PORT-A-CATH (attached catheter in left subclavian);  Surgeon: Virl Cagey, MD;  Location: [facility] ORS;  Service: General;  Laterality: Left;  . RT HIP SURGERY    . SPHINCTEROTOMY N/A 01/24/2020   Procedure: SPHINCTEROTOMY;  Surgeon: Rogene Houston, MD;  [facility];  Service: Endoscopy;  Laterality: N/A;  . TOTAL HIP ARTHROPLASTY Left 04/07/2016   Procedure: LEFT TOTAL HIP ARTHROPLASTY ANTERIOR APPROACH;  Surgeon: Paralee Cancel, MD;  Location: [facility];  Service: Orthopedics;  Laterality: Left;  Unsuccessful for Spinal, went to General      Allergies: Patient has no known allergies.  Medications: Prior to Admission medications   Medication Sig Start Date End Date Taking? Authorizing Provider  amLODipine (NORVASC) 10 MG tablet Take 1 tablet (10 mg total) by mouth daily. 07/06/19  Yes Derek Jack, MD  doxazosin (CARDURA) 2 MG tablet Take 2 mg by mouth at bedtime.    Yes [provider]  lidocaine-prilocaine (EMLA) cream Apply to skin over port a cath one hour prior to chemotherapy appointment Patient taking differently: Apply 1 application topically daily as needed (port access).  06/22/19  Yes Derek Jack, MD  oxyCODONE (OXY IR/ROXICODONE) 5 MG immediate release tablet Take 1 tablet (5 mg total) by mouth every 4 (four) hours as needed for severe pain. 01/24/20  Yes Rehman, Mechele Dawley, MD  pantoprazole  (PROTONIX) 40 MG tablet Take 1 tablet (40 mg total) by mouth daily before breakfast. 01/24/20  Yes Rehman, Mechele Dawley, MD  polyethylene glycol powder (GLYCOLAX/MIRALAX) 17 GM/SCOOP powder Take 17 g by mouth daily. Patient taking differently: Take 8.5 g by mouth daily.  02/12/20  Yes Rehman, Mechele Dawley, MD  potassium chloride (KLOR-CON) 10 MEQ tablet TAKE 2 TABLETS (20 MEQ TOTAL) BY MOUTH 2 (TWO) TIMES DAILY. Patient taking differently: Take 20 mEq by mouth 2 (two) times daily.  11/21/19  Yes Derek Jack, MD  prochlorperazine (COMPAZINE) 10 MG tablet TAKE 1 TABLET (10 MG TOTAL) BY MOUTH EVERY 6 (SIX) HOURS AS NEEDED (NAUSEA OR VOMITING). 08/30/19  Yes Lockamy, Randi L, NP-C  Simethicone (PHAZYME) 180 MG CAPS Take 1 capsule (180 mg total) by mouth 3 (three) times daily as needed. Patient taking differently: Take 180 mg by mouth 3 (three) times daily as needed (gas).  02/12/20  Yes Rehman, Mechele Dawley, MD  vitamin B-12 (CYANOCOBALAMIN) 1000 MCG tablet Take 1,000 mcg by mouth daily as needed (energy).   Yes [provider]  lidocaine (XYLOCAINE) 2 % solution Swish and swallow 1 tablespoon four times a day prn sore mouth Patient not taking: Reported on 02/13/2020 03/23/19   Derek Jack, MD  prochlorperazine (COMPAZINE) 10 MG tablet TAKE 1 TABLET (10 MG TOTAL) BY MOUTH EVERY 6 (SIX) HOURS AS NEEDED (NAUSEA OR VOMITING). 08/02/19   Glennie Isle, NP-C     Vital Signs: BP 139/70   Pulse 68   Temp (!) 97.3 F (36.3 C) (Skin)   Resp 16   Ht 5\' 5"  (1.651 m)   Wt 199 lb (90.3 kg)   SpO2 100%   BMI 33.12 kg/m   Physical Exam awake, alert.  Chest clear to auscultation bilaterally.  Clean, intact left chest wall Port-A-Cath.  Heart with regular rate and rhythm.  Abdomen soft, slightly distended, some diffuse generalized tenderness noted; no significant lower extremity edema  Imaging: No results found.  Labs:  CBC: Recent Labs    01/04/20 1108 01/17/20 1449 01/30/20 1430  02/06/20 1347  WBC 3.6* 5.2 5.6 4.7  HGB 10.8* 10.3* 9.4* 9.5*  HCT 32.8* 30.7* 27.9* 29.5*  PLT 126* 179 157 154    COAGS: No results for input(s): INR, APTT in the last 8760 hours.  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  She  has also undergone prior right liver lesion biopsy on 01/31/2019 which yielded adenocarcinoma.  Recent imaging has revealed: 1. 1.9 x 1.2 cm soft tissue lesion obliterates the common duct just distal to the left and right hepatic duct confluence. This obliterates the common duct along a distance of 1.3 cm. Distal common duct and common bile duct are patent. Imaging features compatible with obstruction by the patient's known cholangiocarcinoma 2. The abnormal soft tissue seen obliterating the common duct involves the gallbladder wall near the neck and extends posteriorly over the neck of the gallbladder into the right hepatic lobe just posterior to the gallbladder fossa. 3. Similar appearance of lymphadenopathy in the hepatoduodenal ligament. 4. Multiple hepatic cysts. 5. Large septated cystic lesion in the pelvis, similar to recent CT scan where it was better visualized.  She presents today for image guided segment 5 liver lesion  biopsy for further evaluation.Risks and benefits of procedure was discussed with the patient  including, but not limited to bleeding, infection, damage to adjacent structures or low yield requiring additional tests.  All of the questions were answered and there is agreement to proceed.  Consent signed and in chart.    Electronically Signed: D. Rowe Robert, PA-C 02/15/2020, 11:50 AM   I spent a total of 25 minutes at the the patient's bedside AND on the patient's hospital floor or unit, greater than 50% of which was counseling/coordinating care for image guided liver lesion biopsy

## 2020-02-15 NOTE — Procedures (Signed)
Pre Procedure Dx: History of cholangiocarcinoma, now with ill defined liver lesions worrisome for mets Post Procedural Dx: Same  Extensive sonographic evaluation performed by the dictating interventional radiologist is negative definitive liver lesion.  As such, no biopsy was performed.  D/W Dr. Delton Coombes at the time of procedure cancellation.   Ronny Bacon, MD Pager #: [phone]

## 2020-02-20 ENCOUNTER — Encounter (HOSPITAL_COMMUNITY): Payer: Self-pay | Admitting: Hematology

## 2020-02-20 ENCOUNTER — Encounter (HOSPITAL_COMMUNITY): Payer: Self-pay | Admitting: Radiology

## 2020-02-20 ENCOUNTER — Other Ambulatory Visit: Payer: Self-pay

## 2020-02-20 ENCOUNTER — Inpatient Hospital Stay (HOSPITAL_COMMUNITY): Payer: Medicare Other

## 2020-02-20 ENCOUNTER — Inpatient Hospital Stay (HOSPITAL_BASED_OUTPATIENT_CLINIC_OR_DEPARTMENT_OTHER): Payer: Medicare Other | Admitting: Hematology

## 2020-02-20 VITALS — BP 135/62 | HR 78 | Temp 96.0°F | Resp 16 | Wt 199.4 lb

## 2020-02-20 DIAGNOSIS — C221 Intrahepatic bile duct carcinoma: Secondary | ICD-10-CM

## 2020-02-20 DIAGNOSIS — C787 Secondary malignant neoplasm of liver and intrahepatic bile duct: Secondary | ICD-10-CM | POA: Diagnosis not present

## 2020-02-20 LAB — CBC WITH DIFFERENTIAL/PLATELET
Abs Immature Granulocytes: 0.01 10*3/uL (ref 0.00–0.07)
Basophils Absolute: 0 10*3/uL (ref 0.0–0.1)
Basophils Relative: 1 %
Eosinophils Absolute: 0.2 10*3/uL (ref 0.0–0.5)
Eosinophils Relative: 3 %
HCT: 31 % — ABNORMAL LOW (ref 36.0–46.0)
Hemoglobin: 10 g/dL — ABNORMAL LOW (ref 12.0–15.0)
Immature Granulocytes: 0 %
Lymphocytes Relative: 38 %
Lymphs Abs: 2.3 10*3/uL (ref 0.7–4.0)
MCH: 33.6 pg (ref 26.0–34.0)
MCHC: 32.3 g/dL (ref 30.0–36.0)
MCV: 104 fL — ABNORMAL HIGH (ref 80.0–100.0)
Monocytes Absolute: 0.6 10*3/uL (ref 0.1–1.0)
Monocytes Relative: 10 %
Neutro Abs: 2.9 10*3/uL (ref 1.7–7.7)
Neutrophils Relative %: 48 %
Platelets: 151 10*3/uL (ref 150–400)
RBC: 2.98 MIL/uL — ABNORMAL LOW (ref 3.87–5.11)
RDW: 16.1 % — ABNORMAL HIGH (ref 11.5–15.5)
WBC: 5.9 10*3/uL (ref 4.0–10.5)
nRBC: 0.5 % — ABNORMAL HIGH (ref 0.0–0.2)

## 2020-02-20 LAB — COMPREHENSIVE METABOLIC PANEL
ALT: 23 U/L (ref 0–44)
AST: 32 U/L (ref 15–41)
Albumin: 3.4 g/dL — ABNORMAL LOW (ref 3.5–5.0)
Alkaline Phosphatase: 285 U/L — ABNORMAL HIGH (ref 38–126)
Anion gap: 12 (ref 5–15)
BUN: 11 mg/dL (ref 8–23)
CO2: 24 mmol/L (ref 22–32)
Calcium: 9.2 mg/dL (ref 8.9–10.3)
Chloride: 104 mmol/L (ref 98–111)
Creatinine, Ser: 1.77 mg/dL — ABNORMAL HIGH (ref 0.44–1.00)
GFR calc Af Amer: 35 mL/min — ABNORMAL LOW (ref >60)
GFR calc non Af Amer: 30 mL/min — ABNORMAL LOW (ref >60)
Glucose, Bld: 133 mg/dL — ABNORMAL HIGH (ref 70–99)
Potassium: 3.4 mmol/L — ABNORMAL LOW (ref 3.5–5.1)
Sodium: 140 mmol/L (ref 135–145)
Total Bilirubin: 3.2 mg/dL — ABNORMAL HIGH (ref 0.3–1.2)
Total Protein: 7.3 g/dL (ref 6.5–8.1)

## 2020-02-20 LAB — BILIRUBIN, DIRECT: Bilirubin, Direct: 1.4 mg/dL — ABNORMAL HIGH (ref 0.0–0.2)

## 2020-02-20 NOTE — Progress Notes (Signed)
[place], [place] [address]   CLINIC:  Medical Oncology/Hematology  PCP:  Cory Munch, PA-C [place] [id number] [phone]   REASON FOR VISIT: Follow-up for cholangiocarcinoma to the liver.  CURRENT THERAPY: Observation.  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[place])  02/08/2019 Initial Diagnosis   Cholangiocarcinoma metastatic to liver ([place])   02/22/2019 -  Chemotherapy   The patient had palonosetron (ALOXI) injection 0.25 mg, 0.25 mg, Intravenous,  Once, 10 of 10 cycles Administration: 0.25 mg (02/22/2019), 0.25 mg (03/01/2019), 0.25 mg (03/15/2019), 0.25 mg (03/22/2019), 0.25 mg (04/06/2019), 0.25 mg (04/13/2019), 0.25 mg (04/27/2019), 0.25 mg (05/04/2019), 0.25 mg (05/18/2019), 0.25 mg (05/25/2019), 0.25 mg (06/16/2019), 0.25 mg (06/22/2019), 0.25 mg (07/13/2019), 0.25 mg (07/20/2019), 0.25 mg (08/10/2019), 0.25 mg (08/24/2019), 0.25 mg (09/07/2019), 0.25 mg (09/14/2019) pegfilgrastim-cbqv (UDENYCA) injection 6 mg, 6 mg, Subcutaneous, Once, 10 of 10 cycles Administration: 6 mg (03/02/2019), 6 mg (03/24/2019), 6 mg (05/05/2019), 6 mg (05/26/2019), 6 mg (06/23/2019), 6 mg (07/21/2019), 6 mg (08/25/2019), 6 mg (09/15/2019) CISplatin (PLATINOL) 52 mg in sodium chloride 0.9 % 250 mL chemo infusion, 25 mg/m2 = 52 mg, Intravenous,  Once, 10 of 10 cycles Dose modification: 20 mg/m2 (80 % of original dose 25 mg/m2, Cycle 9, Reason: Other (see comments), Comment: renal insufficiency) Administration: 52 mg (02/22/2019), 52 mg (03/01/2019), 52 mg (03/15/2019), 52 mg (03/22/2019), 52 mg (04/06/2019), 52 mg (04/13/2019), 52 mg (04/27/2019), 52 mg (05/04/2019), 52 mg (05/18/2019), 52 mg (05/25/2019), 52 mg (06/16/2019), 52 mg (06/22/2019), 52 mg (07/13/2019), 52 mg (07/20/2019), 52 mg (08/10/2019), 52 mg (08/24/2019), 42 mg (09/07/2019), 42 mg (09/14/2019) gemcitabine (GEMZAR) 2,000 mg in sodium chloride 0.9 % 250 mL chemo infusion,  2,090 mg, Intravenous,  Once, 10 of 10 cycles Dose modification: 750 mg/m2 (75 % of original dose 1,000 mg/m2, Cycle 4, Reason: Other (see comments), Comment: neutropenia), 750 mg/m2 (75 % of original dose 1,000 mg/m2, Cycle 7, Reason: Other (see comments), Comment: neutripenia) Administration: 2,000 mg (02/22/2019), 2,000 mg (03/01/2019), 2,000 mg (03/15/2019), 2,000 mg (03/22/2019), 2,000 mg (04/06/2019), 2,000 mg (04/13/2019), 2,000 mg (04/27/2019), 1,558 mg (05/04/2019), 2,000 mg (05/18/2019), 2,000 mg (05/25/2019), 2,000 mg (06/16/2019), 2,000 mg (06/22/2019), 2,000 mg (07/13/2019), 1,558 mg (07/20/2019), 2,000 mg (08/10/2019), 2,000 mg (08/24/2019), 2,000 mg (09/07/2019), 2,000 mg (09/14/2019) fosaprepitant (EMEND) 150 mg, dexamethasone (DECADRON) 12 mg in sodium chloride 0.9 % 145 mL IVPB, , Intravenous,  Once, 10 of 10 cycles Administration:  (02/22/2019),  (03/01/2019),  (03/15/2019),  (03/22/2019),  (04/06/2019),  (04/13/2019),  (04/27/2019),  (05/04/2019),  (05/18/2019),  (05/25/2019),  (06/16/2019),  (06/22/2019),  (07/13/2019),  (07/20/2019),  (08/10/2019),  (08/24/2019),  (09/07/2019),  (09/14/2019)  for chemotherapy treatment.       INTERVAL HISTORY:  Nicole Ibarra 64 y.o. female seen for follow-up of cholangiocarcinoma and hyperbilirubinemia.  Reports 100% appetite and energy levels of 25%.  Has occasional lower abdominal pain.  Denies any nausea or vomiting.  Overall her appetite is improving.  She was evaluated by IR and biopsy of the liver lesion could not be done as it cannot be seen on ultrasound.  REVIEW OF SYSTEMS:  Review of Systems  Neurological: Positive for numbness.  All other systems reviewed and are negative.    PAST MEDICAL/SURGICAL HISTORY:  Past Medical History:  Diagnosis Date  . Anxiety   . Arthritis   . Cancer ([place]) 01/2019   Metastatic cholangiocarcinoma  . Depression   . Hypertension  Past Surgical History:  Procedure Laterality Date  . BILIARY STENT PLACEMENT N/A 01/24/2020   Procedure:  BILIARY STENT PLACEMENT;  Surgeon: Rogene Houston, MD;  [facility];  Service: Endoscopy;  Laterality: N/A;  . COLONOSCOPY N/A 10/23/2016   Procedure: COLONOSCOPY;  Surgeon: Danie Binder, MD;  [facility];  Service: Endoscopy;  Laterality: N/A;  11:30 Am  . ERCP N/A 01/24/2020   Procedure: ENDOSCOPIC RETROGRADE CHOLANGIOPANCREATOGRAPHY (ERCP);  Surgeon: Rogene Houston, MD;  [facility];  Service: Endoscopy;  Laterality: N/A;  . POLYPECTOMY  10/23/2016   Procedure: POLYPECTOMY;  Surgeon: Danie Binder, MD;  [facility];  Service: Endoscopy;;  sigmoid colon polyp  . PORTACATH PLACEMENT Left 02/16/2019   Procedure: INSERTION PORT-A-CATH (attached catheter in left subclavian);  Surgeon: Virl Cagey, MD;  Location: [facility];  Service: General;  Laterality: Left;  . RT HIP SURGERY    . SPHINCTEROTOMY N/A 01/24/2020   Procedure: SPHINCTEROTOMY;  Surgeon: Rogene Houston, MD;  [facility];  Service: Endoscopy;  Laterality: N/A;  . TOTAL HIP ARTHROPLASTY Left 04/07/2016   Procedure: LEFT TOTAL HIP ARTHROPLASTY ANTERIOR APPROACH;  Surgeon: Paralee Cancel, MD;  [facility];  Service: Orthopedics;  Laterality: Left;  Unsuccessful for Spinal, went to General     SOCIAL HISTORY:  Social History   Socioeconomic History  . Marital status: Single    Spouse name: Not on file  . Number of children: Not on file  . Years of education: Not on file  . Highest education level: Not on file  Occupational History  . Not on file  Tobacco Use  . Smoking status: Current Every Day Smoker    Packs/day: 0.25    Years: 42.00    Pack years: 10.50  . Smokeless tobacco: Never Used  Substance and Sexual Activity  . Alcohol use: No  . Drug use: No  . Sexual activity: Not Currently  Other Topics Concern  . Not on file  Social History Narrative  . Not on file   Social Determinants of Health   Financial Resource Strain:   . Difficulty of  Paying Living Expenses:   Food Insecurity:   . Worried About Charity fundraiser in the Last Year:   . Arboriculturist in the Last Year:   Transportation Needs:   . Film/video editor (Medical):   Nicole Ibarra Lack of Transportation (Non-Medical):   Physical Activity:   . Days of Exercise per Week:   . Minutes of Exercise per Session:   Stress:   . Feeling of Stress :   Social Connections:   . Frequency of Communication with Friends and Family:   . Frequency of Social Gatherings with Friends and Family:   . Attends Religious Services:   . Active Member of Clubs or Organizations:   . Attends Archivist Meetings:   Nicole Ibarra Marital Status:   Intimate Partner Violence:   . Fear of Current or Ex-Partner:   . Emotionally Abused:   Nicole Ibarra Physically Abused:   . Sexually Abused:     FAMILY HISTORY:  Family History  Problem Relation Age of Onset  . Hypertension Mother   . Cancer Father   . Hypertension Brother   . Cancer Brother   . Hypertension Sister   . Cancer Sister     CURRENT MEDICATIONS:  Outpatient Encounter Medications as of 02/20/2020  Medication Sig  . amLODipine (NORVASC) 10 MG tablet  Take 1 tablet (10 mg total) by mouth daily.  Nicole Ibarra doxazosin (CARDURA) 2 MG tablet Take 2 mg by mouth at bedtime.   . lidocaine (XYLOCAINE) 2 % solution Swish and swallow 1 tablespoon four times a day prn sore mouth  . pantoprazole (PROTONIX) 40 MG tablet Take 1 tablet (40 mg total) by mouth daily before breakfast.  . polyethylene glycol powder (GLYCOLAX/MIRALAX) 17 GM/SCOOP powder Take 17 g by mouth daily. (Patient taking differently: Take 8.5 g by mouth daily. )  . potassium chloride (KLOR-CON) 10 MEQ tablet TAKE 2 TABLETS (20 MEQ TOTAL) BY MOUTH 2 (TWO) TIMES DAILY. (Patient taking differently: Take 20 mEq by mouth 2 (two) times daily. )  . prochlorperazine (COMPAZINE) 10 MG tablet TAKE 1 TABLET (10 MG TOTAL) BY MOUTH EVERY 6 (SIX) HOURS AS NEEDED (NAUSEA OR VOMITING).  . Simethicone (PHAZYME)  180 MG CAPS Take 1 capsule (180 mg total) by mouth 3 (three) times daily as needed. (Patient taking differently: Take 180 mg by mouth 3 (three) times daily as needed (gas). )  . vitamin B-12 (CYANOCOBALAMIN) 1000 MCG tablet Take 1,000 mcg by mouth daily as needed (energy).  Nicole Ibarra lidocaine-prilocaine (EMLA) cream Apply to skin over port a cath one hour prior to chemotherapy appointment (Patient not taking: Reported on 02/20/2020)  . oxyCODONE (OXY IR/ROXICODONE) 5 MG immediate release tablet Take 1 tablet (5 mg total) by mouth every 4 (four) hours as needed for severe pain. (Patient not taking: Reported on 02/20/2020)  . [DISCONTINUED] prochlorperazine (COMPAZINE) 10 MG tablet TAKE 1 TABLET (10 MG TOTAL) BY MOUTH EVERY 6 (SIX) HOURS AS NEEDED (NAUSEA OR VOMITING).   No facility-administered encounter medications on file as of 02/20/2020.    ALLERGIES:  No Known Allergies   PHYSICAL EXAM:  ECOG Performance status: 1  Vitals:   02/20/20 0900  BP: 135/62  Pulse: 78  Resp: 16  Temp: (!) 96 F (35.6 C)  SpO2: 100%   Filed Weights   02/20/20 0900  Weight: 199 lb 6 oz (90.4 kg)    Physical Exam Constitutional:      Appearance: Normal appearance. She is normal weight.  Cardiovascular:     Rate and Rhythm: Normal rate and regular rhythm.     Heart sounds: Normal heart sounds.  Pulmonary:     Effort: Pulmonary effort is normal.     Breath sounds: Normal breath sounds.  Abdominal:     General: Bowel sounds are normal.     Palpations: Abdomen is soft.  Musculoskeletal:        General: Normal range of motion.  Skin:    General: Skin is warm and dry.  Neurological:     Mental Status: She is alert and oriented to person, place, and time. Mental status is at baseline.  Psychiatric:        Mood and Affect: Mood normal.        Behavior: Behavior normal.        Thought Content: Thought content normal.        Judgment: Judgment normal.      LABORATORY DATA:  I have reviewed the labs as  listed.  CBC    Component Value Date/Time   WBC 5.9 02/20/2020 0911   RBC 2.98 (L) 02/20/2020 0911   HGB 10.0 (L) 02/20/2020 0911   HCT 31.0 (L) 02/20/2020 0911   PLT 151 02/20/2020 0911   MCV 104.0 (H) 02/20/2020 0911   MCH 33.6 02/20/2020 0911   MCHC 32.3 02/20/2020 0911  RDW 16.1 (H) 02/20/2020 0911   LYMPHSABS 2.3 02/20/2020 0911   MONOABS 0.6 02/20/2020 0911   EOSABS 0.2 02/20/2020 0911   BASOSABS 0.0 02/20/2020 0911   CMP Latest Ref Rng & Units 02/20/2020 02/06/2020 01/30/2020  Glucose 70 - 99 mg/dL 133(H) 163(H) 109(H)  BUN 8 - 23 mg/dL 11 13 12   Creatinine 0.44 - 1.00 mg/dL 1.77(H) 1.51(H) 1.43(H)  Sodium 135 - 145 mmol/L 140 137 137  Potassium 3.5 - 5.1 mmol/L 3.4(L) 3.4(L) 3.4(L)  Chloride 98 - 111 mmol/L 104 102 104  CO2 22 - 32 mmol/L 24 24 21(L)  Calcium 8.9 - 10.3 mg/dL 9.2 9.2 9.3  Total Protein 6.5 - 8.1 g/dL 7.3 7.4 7.3  Total Bilirubin 0.3 - 1.2 mg/dL 3.2(H) 7.1(H) 11.3(H)  Alkaline Phos 38 - 126 U/L 285(H) 415(H) 402(H)  AST 15 - 41 U/L 32 61(H) 72(H)  ALT 0 - 44 U/L 23 50(H) 45(H)    I have reviewed her scans.   ASSESSMENT & PLAN:   Cholangiocarcinoma metastatic to liver ([place]) 1.  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 -CT CAP on 12/27/2019 showed large cystic mass in the pelvis.  Stable porta hepatic adenopathy.  Mass along the gallbladder fossa difficult to measure based on orientation.  Small pulmonary nodules are stable. -I have recommended biopsy of the liver lesion.  The lesion could not be visualized by ultrasound.  Hence biopsy was abandoned. -I would reach out to IR to see if the pelvic mass can be biopsied.  This is very important for her treatment as cholangiocarcinoma is associated with various mutations which can impact her treatment. -If she does not have any targetable mutations, next chemotherapy would be FOLFOX based.  2.  Hyperbilirubinemia: -MRCP showed 1.9  cm soft tissue lesion obliterating the common bile duct. -ERCP and plastic stent placement in the right hepatic biliary system by Dr. Laural Golden on 01/24/2020. -We reviewed labs today.  Total bilirubin improved to 3.2 with direct bilirubin of 1.4. -AST and ALT have normalized.  We will plan to repeat labs in 1 to 2 weeks.  3.  CKD: -This is from prior cisplatin therapy.  Creatinine today is 1.7.  4.  Peripheral neuropathy: -She has some numbness in the feet from prior cisplatin.  No neuropathic pains.  5.  Abdominal pain: -She has some lower quadrant pain on and off.  She is taking hydrocodone which is helping. -Lying down also helps.  This pain is coming from pelvic mass. -Systemic therapy will help improve the pain.     Orders placed this encounter:  Orders Placed This Encounter  Procedures  . US Guided Needle Placement      Derek Jack, [place] [phone]

## 2020-02-20 NOTE — Patient Instructions (Signed)
Nicole Ibarra at [facility] Discharge Instructions  You were seen today by Dr. Delton Coombes. He went over your recent lab results. He recommends you start chemotherapy, we are waiting for your bilirubin to return to normal. He will schedule you for a biopsy of your pelvic mass. He will see you back after your biopsy for labs and follow up.   Thank you for choosing [facility] at [facility] to provide your oncology and hematology care.  To afford each patient quality time with our provider, please arrive at least 15 minutes before your scheduled appointment time.   If you have a lab appointment with the [facility] please come in thru the  Main Entrance and check in at the main information desk  You need to re-schedule your appointment should you arrive 10 or more minutes late.  We strive to give you quality time with our providers, and arriving late affects you and other patients whose appointments are after yours.  Also, if you no show three or more times for appointments you may be dismissed from the clinic at the providers discretion.     Again, thank you for choosing [facility].  Our hope is that these requests will decrease the amount of time that you wait before being seen by our physicians.       _____________________________________________________________  Should you have questions after your visit to [facility], please contact our office at [phone] between the hours of 8:00 a.m. and 4:30 p.m.  Voicemails left after 4:00 p.m. will not be returned until the following business day.  For prescription refill requests, have your pharmacy contact our office and allow 72 hours.    Cancer Center Support Programs:   > Cancer Support Group  2nd Tuesday of the month 1pm-2pm, Journey Room

## 2020-02-20 NOTE — Progress Notes (Signed)
Nicole Ibarra Female, 64 y.o., 06-03-1956 MRN:  [id number] Phone:  [phone] Nicole Ibarra) PCP:  [facility] Coverage:  [place] Medicare Next Appt With Oncology 03/07/2020 at 10:00 AM  RE: US Biopsy Received: Today Message Contents  Sandi Mariscal, MD  [name]  No Bx.   No lesion is NOT visible with [place].   Recommend follow-up CT A&P in mid May.   This was d/w Dr. Delton Coombes at the time of the Bx cancellation on 3/11.   Cathren Harsh       Previous Messages   ----- Message -----  From: [name]  Sent: 02/20/2020  9:55 AM EDT  To: Sandi Mariscal, MD  Subject: US Biopsy                     Procedure:  US Biopsy   Reason:  Cholangiocarcinoma metastatic to liver, Pelvic Mass   History:  MR, [place], CT in computer, US Biopsy was previously scheduled on 02/15/2020.  Note in chart from 02/15/2020 from Edwin Cap (see Chart)    Provider: Derek Jack   Provider Contact: [phone]

## 2020-02-20 NOTE — Assessment & Plan Note (Signed)
1.  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 -CT CAP on 12/27/2019 showed large cystic mass in the pelvis.  Stable porta hepatic adenopathy.  Mass along the gallbladder fossa difficult to measure based on orientation.  Small pulmonary nodules are stable. -I have recommended biopsy of the liver lesion.  The lesion could not be visualized by ultrasound.  Hence biopsy was abandoned. -I would reach out to IR to see if the pelvic mass can be biopsied.  This is very important for her treatment as cholangiocarcinoma is associated with various mutations which can impact her treatment. -If she does not have any targetable mutations, next chemotherapy would be FOLFOX based.  2.  Hyperbilirubinemia: -MRCP showed 1.9 cm soft tissue lesion obliterating the common bile duct. -ERCP and plastic stent placement in the right hepatic biliary system by Dr. Laural Golden on 01/24/2020. -We reviewed labs today.  Total bilirubin improved to 3.2 with direct bilirubin of 1.4. -AST and ALT have normalized.  We will plan to repeat labs in 1 to 2 weeks.  3.  CKD: -This is from prior cisplatin therapy.  Creatinine today is 1.7.  4.  Peripheral neuropathy: -She has some numbness in the feet from prior cisplatin.  No neuropathic pains.  5.  Abdominal pain: -She has some lower quadrant pain on and off.  She is taking hydrocodone which is helping. -Lying down also helps.  This pain is coming from pelvic mass. -Systemic therapy will help improve the pain.

## 2020-02-26 ENCOUNTER — Other Ambulatory Visit (HOSPITAL_COMMUNITY): Payer: Self-pay | Admitting: Hematology

## 2020-02-26 IMAGING — MG DIGITAL SCREENING BILATERAL MAMMOGRAM WITH TOMO AND CAD
8 series · 8 of 24 positions shown · non-contrast
Comparison: Previous exam(s).

CLINICAL DATA: Screening.

EXAM:
DIGITAL SCREENING BILATERAL MAMMOGRAM WITH TOMO AND CAD

[R CC synth-2D]
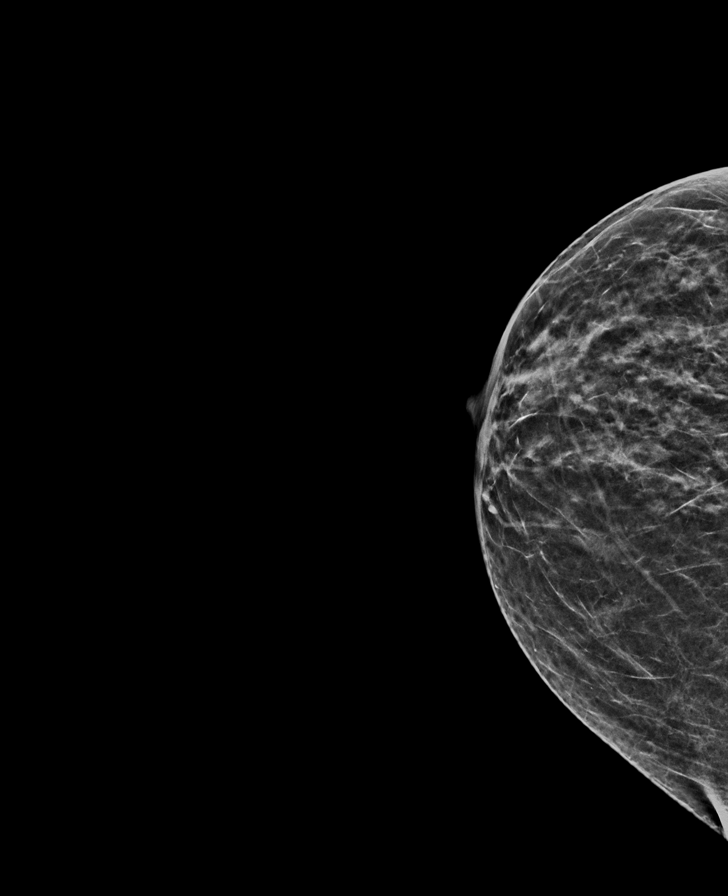

[L MLO synth-2D]
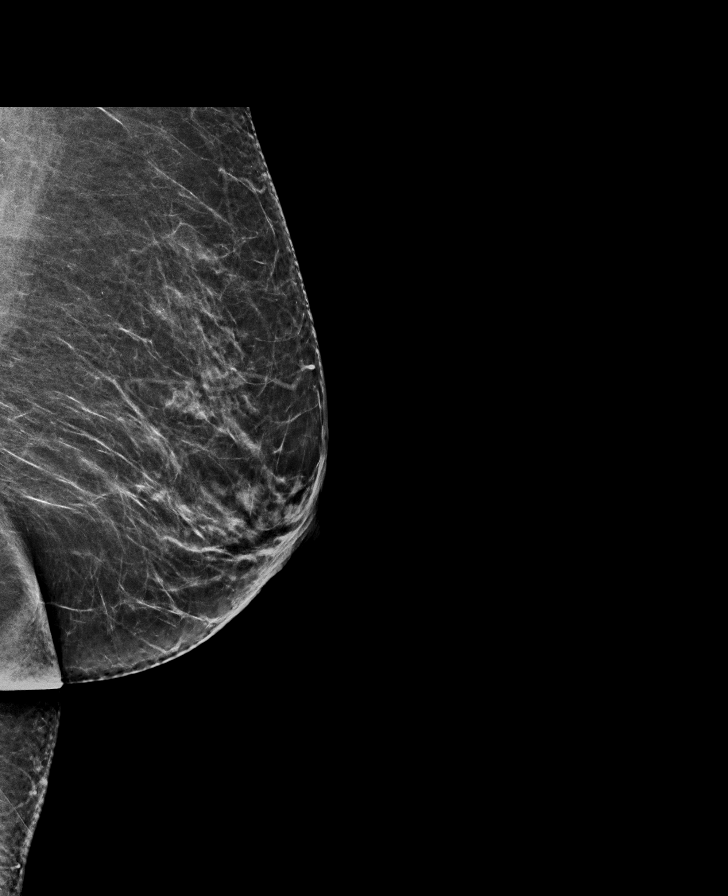

[L CC synth-2D]
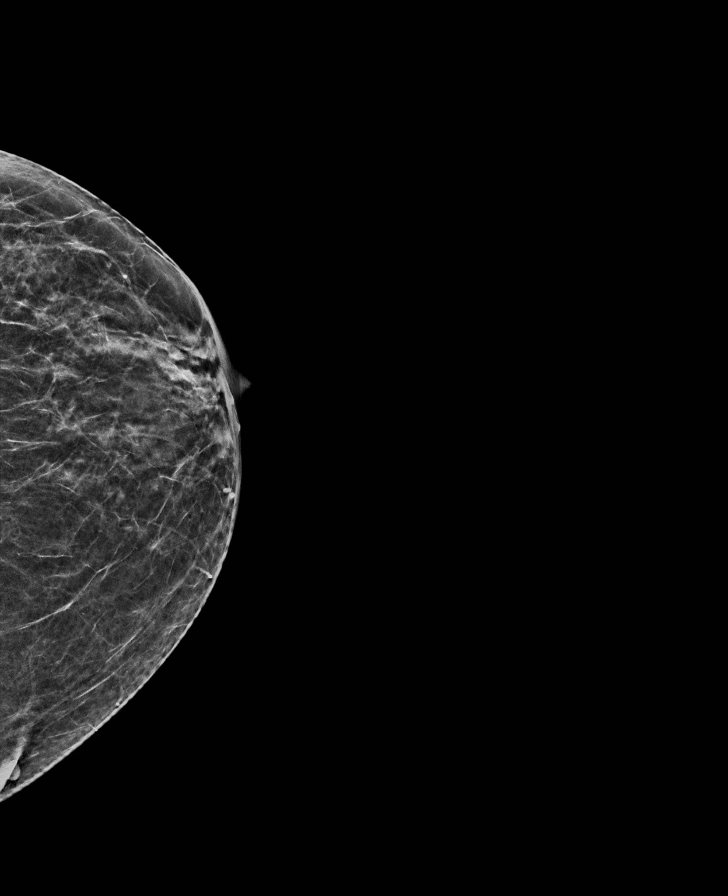

[R MLO synth-2D]
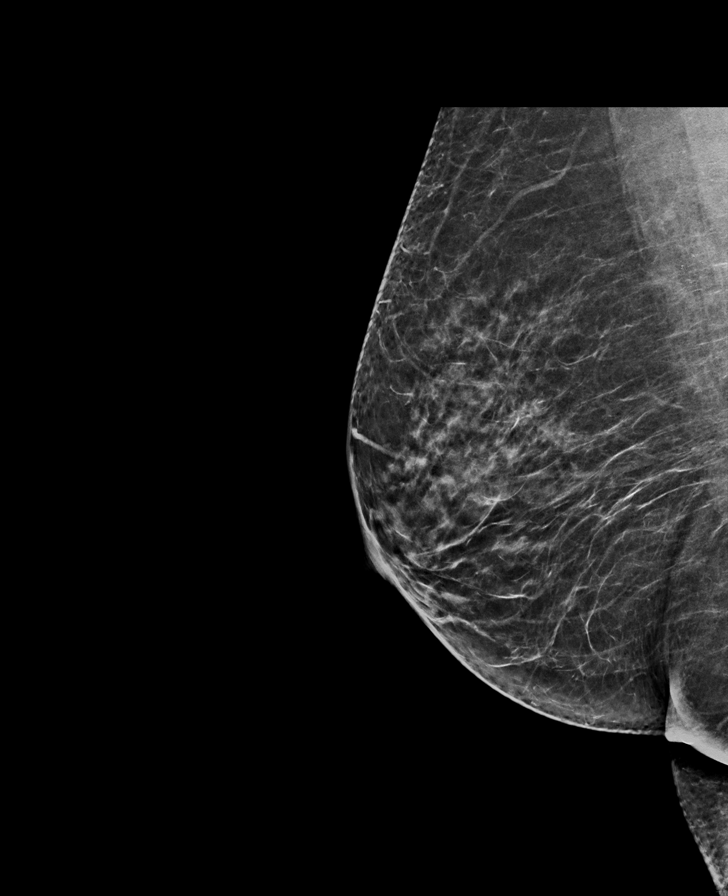

[R MLO tomo · tomo slice 33/65.0]
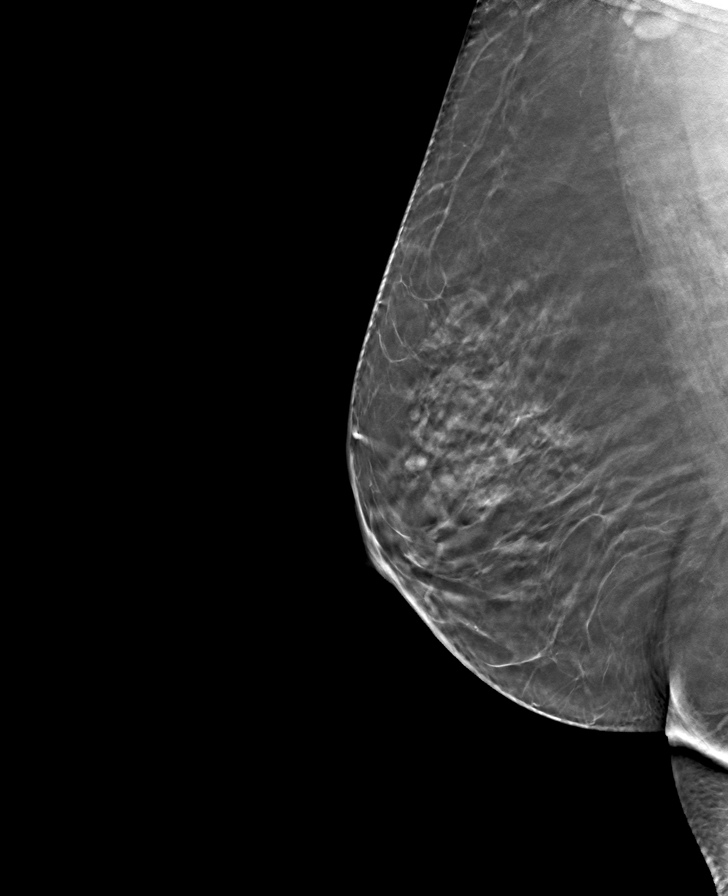

[L MLO tomo · tomo slice 33/65.0]
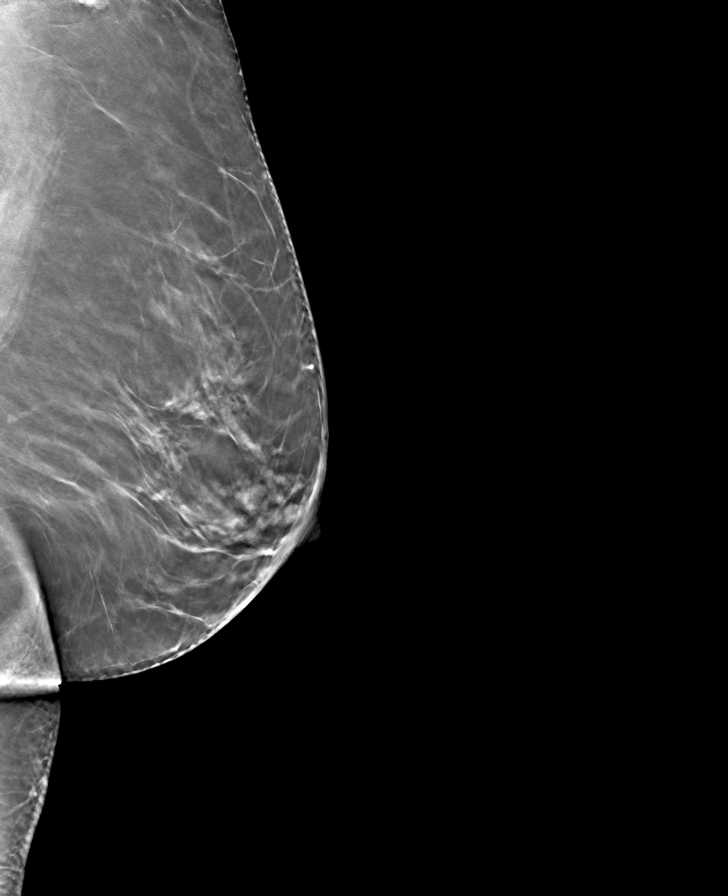

[R CC tomo · tomo slice 26/51.0]
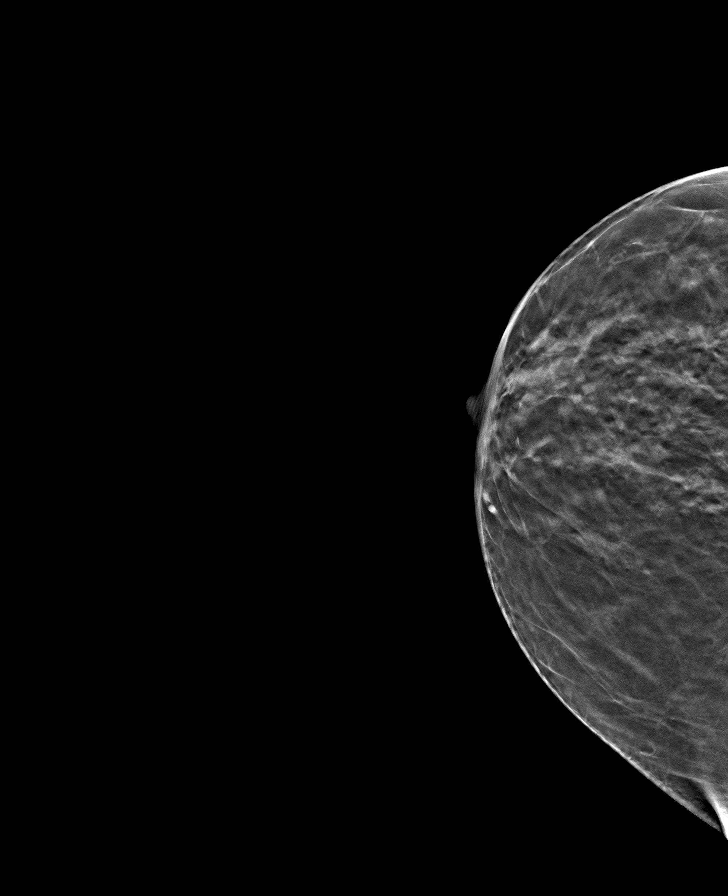

[L CC tomo · tomo slice 25/50.0]
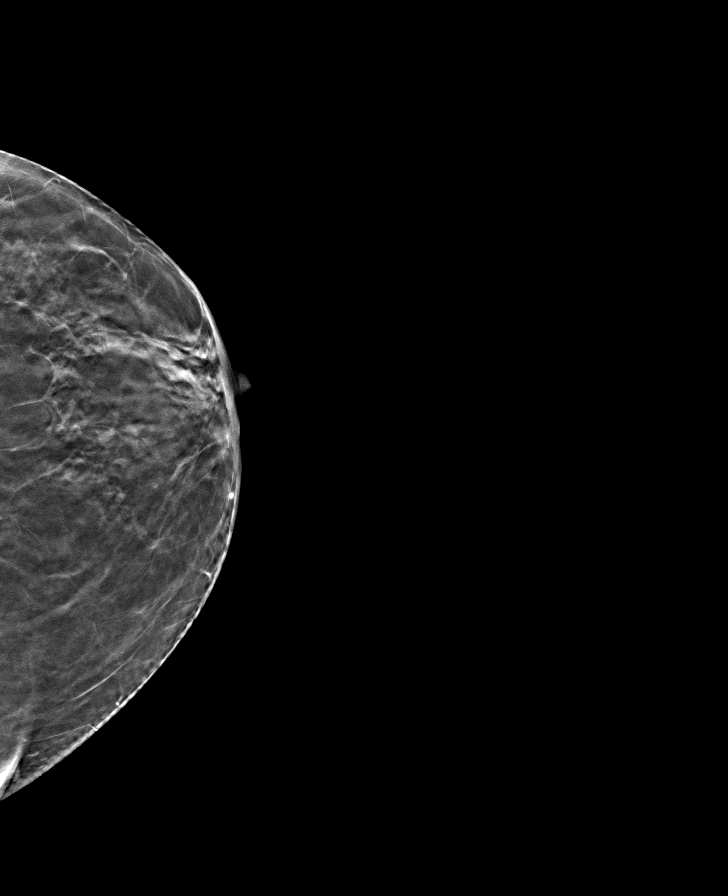

[8 of 24 positions shown; findings below may reference images not displayed]

ACR Breast Density Category b: There are scattered areas of
fibroglandular density.
FINDINGS: There are no findings suspicious for malignancy. Images were
processed with CAD.
IMPRESSION: No mammographic evidence of malignancy. A result letter of this
screening mammogram will be mailed directly to the patient.

RECOMMENDATION:
Screening mammogram in one year. (Code:[id number])

BI-RADS CATEGORY  1: Negative.

## 2020-02-26 IMAGING — CT CT ABDOMEN AND PELVIS WITH CONTRAST
3 of 9 series · 14 of 46 positions shown, 16 images · IV contrast (Isovue)
Comparison: 04/24/2019

CLINICAL DATA: Cholangiocarcinoma with liver metastasis. Ongoing
chemotherapy.

EXAM:
CT CHEST, ABDOMEN, AND PELVIS WITH CONTRAST
TECHNIQUE: Multidetector CT imaging of the chest, abdomen and pelvis was
performed following the standard protocol during bolus
administration of intravenous contrast.
CONTRAST:  100mL OMNIPAQUE IOHEXOL 300 MG/ML  SOLN

[Series 4: axial venous · axial · portal-venous · 0.61mm/px · z∈[-422,+73]mm · 9 of 207 slices shown, 11 images]
[im 21/207  soft-tissue]
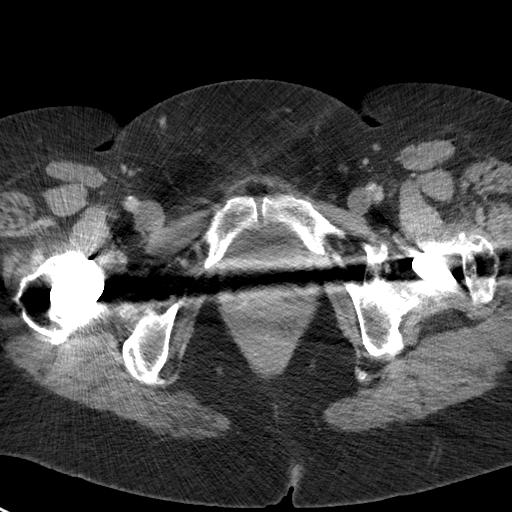
[im 21/207  bone]
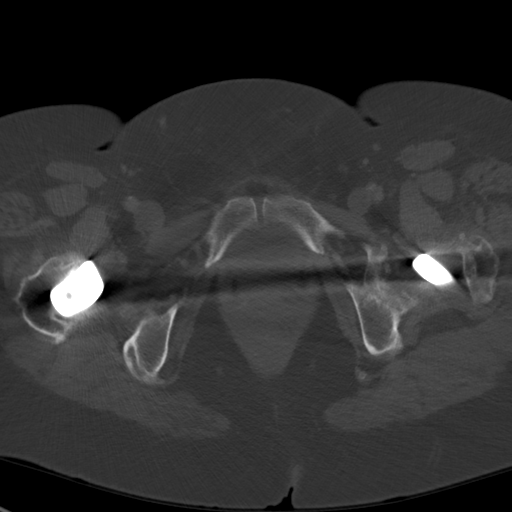
[im 42/207  soft-tissue]
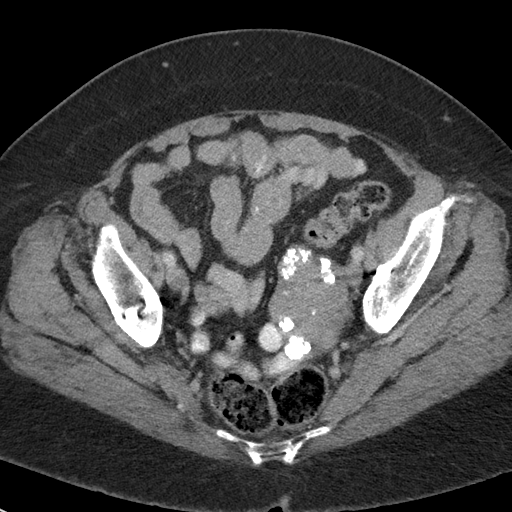
[im 62/207  soft-tissue]
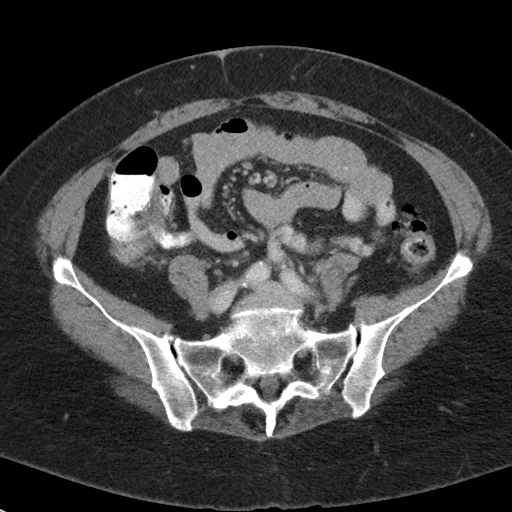
[im 83/207  soft-tissue]
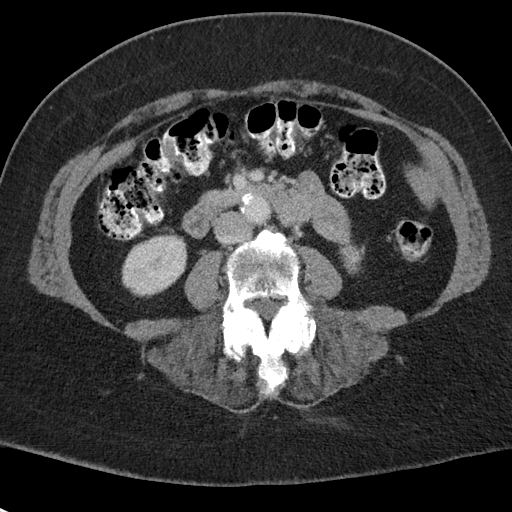
[im 104/207  soft-tissue]
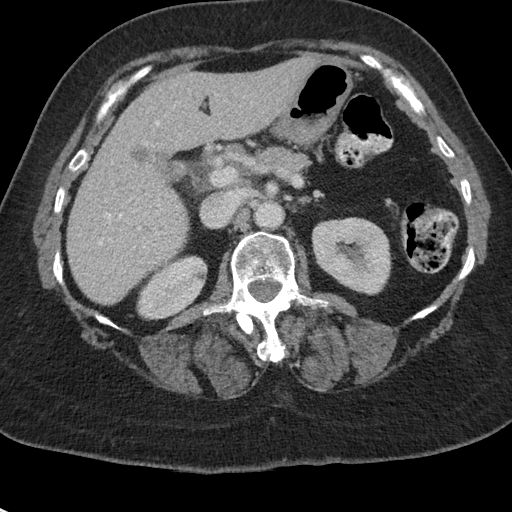
[im 124/207  soft-tissue]
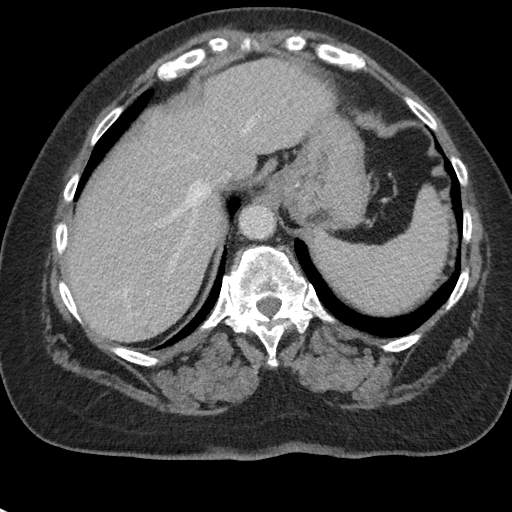
[im 145/207  soft-tissue]
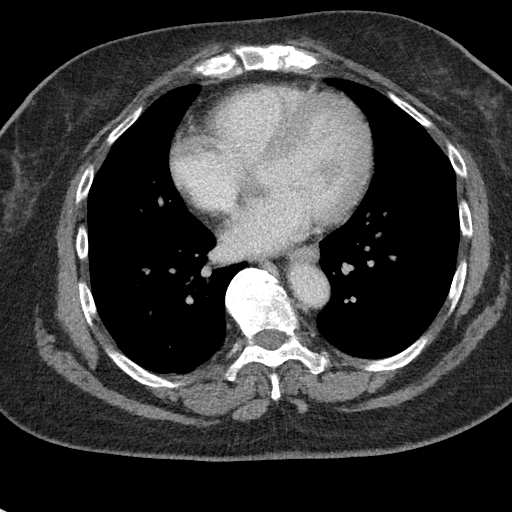
[im 165/207  soft-tissue]
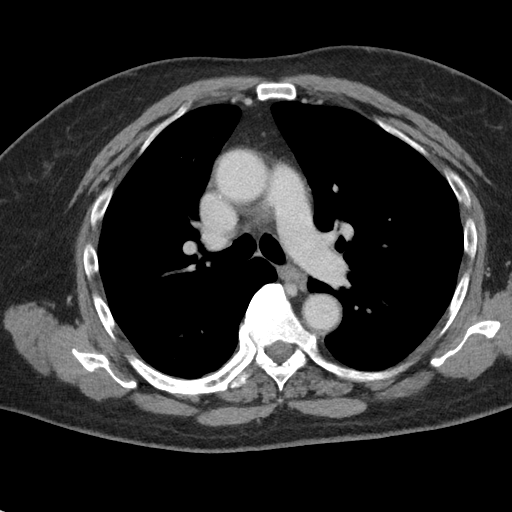
[im 186/207  soft-tissue]
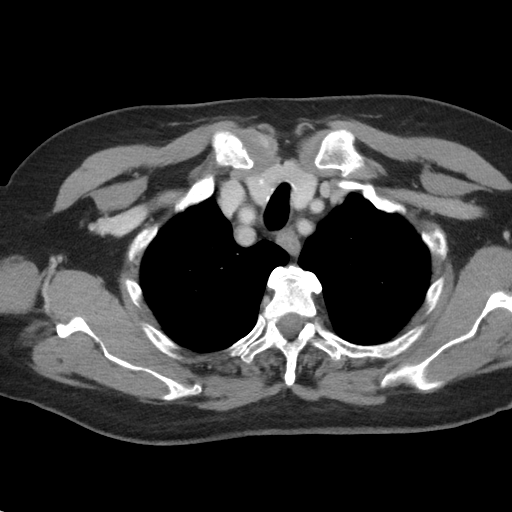
[im 186/207  bone]
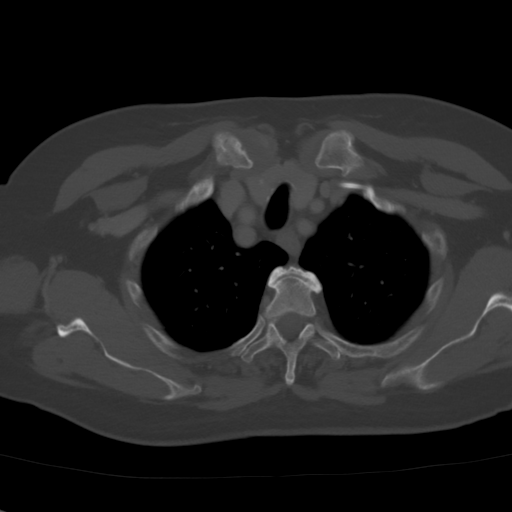

[Series 9: lung venous · axial · portal-venous · 0.61mm/px · z∈[-140,-100]mm · 2 of 158 slices shown]
[im 20/158  bone]
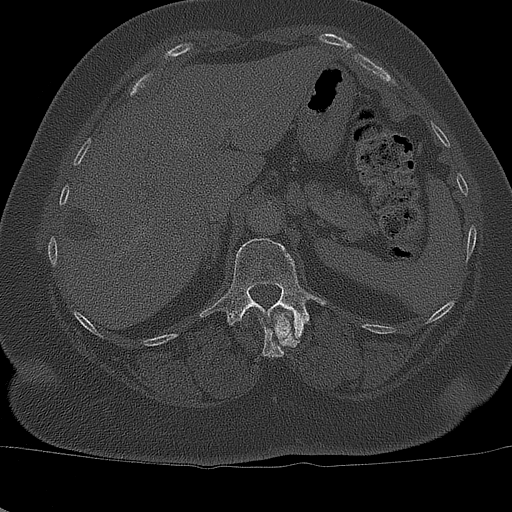
[im 40/158  bone]
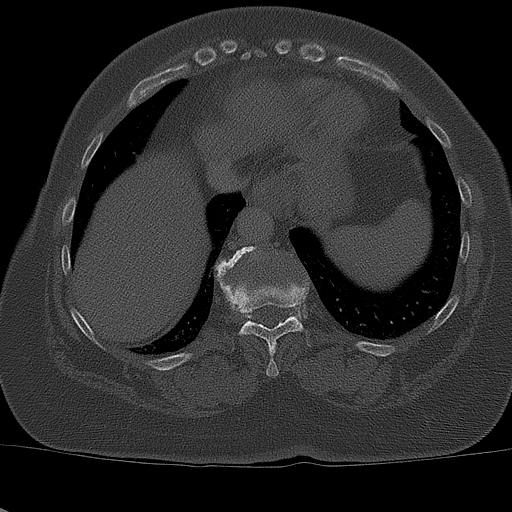

[Series 10: coronal arterial · coronal · arterial · 0.46mm/px · 3 of 101 slices shown]
[im 26/101  soft-tissue]
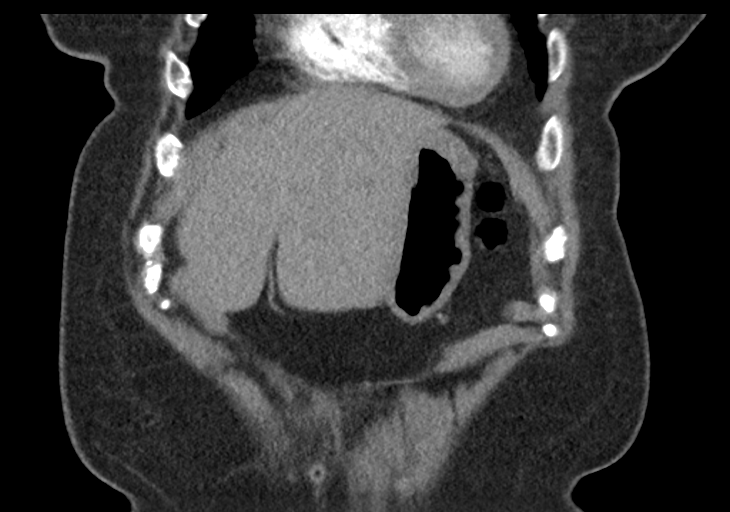
[im 51/101  soft-tissue]
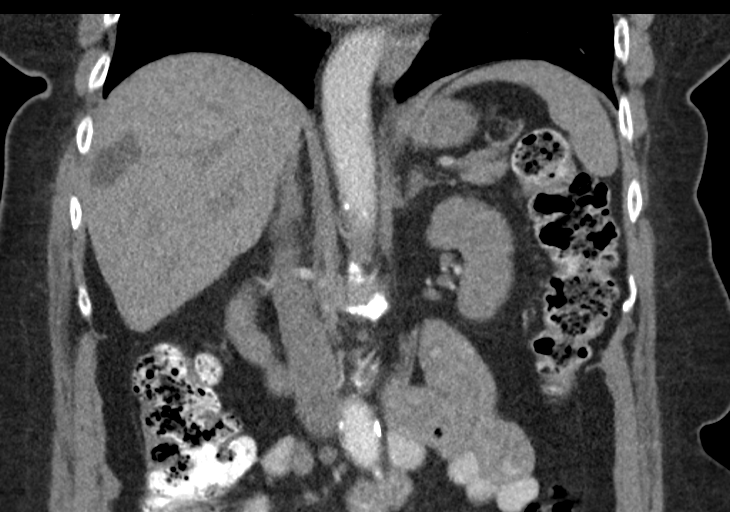
[im 76/101  soft-tissue]
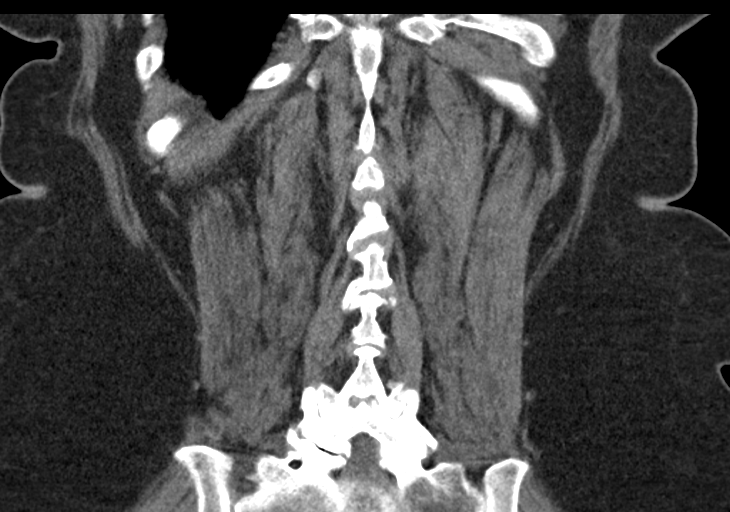

[14 of 46 positions shown; findings below may reference images not displayed]

FINDINGS: CT CHEST FINDINGS

Cardiovascular: Left Port-A-Cath tip at mid SVC. Normal heart size,
without pericardial effusion. No central pulmonary embolism, on this
non-dedicated study.

Mediastinum/Nodes: Subcentimeter nonspecific right thyroid nodule.
No supraclavicular adenopathy. No mediastinal or hilar adenopathy.

Lungs/Pleura: No pleural fluid. Mild centrilobular emphysema.

Right apical nodularity including on [DATE] is unchanged and favored
to be related to pleuroparenchymal scarring.

Musculoskeletal: No acute osseous abnormality.

CT ABDOMEN PELVIS FINDINGS

Hepatobiliary: High right hepatic lobe cysts. Suspected too small to
characterize segment 5 lesion on image 100/4, likely present on the
prior exam and possibly slightly smaller today.

Segment [DATE] hypoattenuating lesion measures 1.1 x 1.3 cm on 105/4.
Compare 1.8 x 1.7 cm on the prior exam (when remeasured).

More inferior, pericholecystic hypoattenuating right liver lobe
lesion measures 1.7 x 1.6 cm on 109/4 versus 2.0 x 2.6 cm on the
prior exam (when remeasured).

Borderline to mild intrahepatic biliary duct dilatation, slightly
decreased.

Normal gallbladder, without biliary ductal dilatation.

Pancreas: Normal, without mass or ductal dilatation.

Spleen: Normal in size, without focal abnormality.

Adrenals/Urinary Tract: Normal adrenal glands. Normal left kidney.
Too small to characterize right renal lesion. No hydronephrosis.
Markedly degraded evaluation of the pelvis secondary to bilateral
hip arthroplasty. Bladder not well evaluated.

Stomach/Bowel: Normal stomach, without wall thickening. Normal
colon, appendix, and terminal ileum. Normal small bowel.

Vascular/Lymphatic: Aortic and branch vessel atherosclerosis.
Adenopathy within the porta hepatis measures 2.6 cm on 103/4 versus
2.8 cm on the prior exam (when remeasured). Pelvic sidewall nodal
stations not well evaluated. No inguinal adenopathy.

Reproductive: Uterine fibroids.  No adnexal mass.

Other: No significant free fluid. No abdominal ascites. No evidence
of omental or peritoneal disease.

Musculoskeletal: Bilateral hip arthroplasty. Thoracolumbar
spondylosis
IMPRESSION: 1. Response to therapy of hepatic metastasis and abdominal
adenopathy.
2. No new sites of disease identified.
3. Markedly degraded evaluation of the pelvis secondary to bilateral
hip arthroplasty.
4. Uterine fibroids.
5.  No acute process or evidence of metastatic disease in the chest.

Aortic Atherosclerosis ([id number]-0XC.C).

## 2020-03-05 ENCOUNTER — Inpatient Hospital Stay (HOSPITAL_BASED_OUTPATIENT_CLINIC_OR_DEPARTMENT_OTHER): Payer: Medicare Other | Admitting: Nurse Practitioner

## 2020-03-05 ENCOUNTER — Other Ambulatory Visit (HOSPITAL_COMMUNITY): Payer: Self-pay | Admitting: *Deleted

## 2020-03-05 ENCOUNTER — Other Ambulatory Visit: Payer: Self-pay

## 2020-03-05 ENCOUNTER — Inpatient Hospital Stay (HOSPITAL_COMMUNITY): Payer: Medicare Other

## 2020-03-05 VITALS — BP 140/73 | HR 80 | Temp 96.9°F | Resp 18 | Wt 189.4 lb

## 2020-03-05 DIAGNOSIS — C787 Secondary malignant neoplasm of liver and intrahepatic bile duct: Secondary | ICD-10-CM

## 2020-03-05 DIAGNOSIS — C221 Intrahepatic bile duct carcinoma: Secondary | ICD-10-CM | POA: Diagnosis not present

## 2020-03-05 LAB — CBC WITH DIFFERENTIAL/PLATELET
Abs Immature Granulocytes: 0.02 10*3/uL (ref 0.00–0.07)
Basophils Absolute: 0 10*3/uL (ref 0.0–0.1)
Basophils Relative: 0 %
Eosinophils Absolute: 0.2 10*3/uL (ref 0.0–0.5)
Eosinophils Relative: 2 %
HCT: 32 % — ABNORMAL LOW (ref 36.0–46.0)
Hemoglobin: 10.5 g/dL — ABNORMAL LOW (ref 12.0–15.0)
Immature Granulocytes: 0 %
Lymphocytes Relative: 24 %
Lymphs Abs: 1.9 10*3/uL (ref 0.7–4.0)
MCH: 33.3 pg (ref 26.0–34.0)
MCHC: 32.8 g/dL (ref 30.0–36.0)
MCV: 101.6 fL — ABNORMAL HIGH (ref 80.0–100.0)
Monocytes Absolute: 0.8 10*3/uL (ref 0.1–1.0)
Monocytes Relative: 11 %
Neutro Abs: 4.8 10*3/uL (ref 1.7–7.7)
Neutrophils Relative %: 63 %
Platelets: 164 10*3/uL (ref 150–400)
RBC: 3.15 MIL/uL — ABNORMAL LOW (ref 3.87–5.11)
RDW: 14 % (ref 11.5–15.5)
WBC: 7.7 10*3/uL (ref 4.0–10.5)
nRBC: 0 % (ref 0.0–0.2)

## 2020-03-05 LAB — COMPREHENSIVE METABOLIC PANEL
ALT: 18 U/L (ref 0–44)
AST: 24 U/L (ref 15–41)
Albumin: 3.2 g/dL — ABNORMAL LOW (ref 3.5–5.0)
Alkaline Phosphatase: 397 U/L — ABNORMAL HIGH (ref 38–126)
Anion gap: 14 (ref 5–15)
BUN: 16 mg/dL (ref 8–23)
CO2: 26 mmol/L (ref 22–32)
Calcium: 9.6 mg/dL (ref 8.9–10.3)
Chloride: 100 mmol/L (ref 98–111)
Creatinine, Ser: 1.88 mg/dL — ABNORMAL HIGH (ref 0.44–1.00)
GFR calc Af Amer: 32 mL/min — ABNORMAL LOW (ref >60)
GFR calc non Af Amer: 28 mL/min — ABNORMAL LOW (ref >60)
Glucose, Bld: 127 mg/dL — ABNORMAL HIGH (ref 70–99)
Potassium: 3.6 mmol/L (ref 3.5–5.1)
Sodium: 140 mmol/L (ref 135–145)
Total Bilirubin: 2.7 mg/dL — ABNORMAL HIGH (ref 0.3–1.2)
Total Protein: 7.9 g/dL (ref 6.5–8.1)

## 2020-03-05 LAB — MAGNESIUM: Magnesium: 1.9 mg/dL (ref 1.7–2.4)

## 2020-03-05 MED ORDER — HYDROCODONE-ACETAMINOPHEN 7.5-325 MG PO TABS: 1.0000 | ORAL_TABLET | Freq: Four times a day (QID) | ORAL | 0 refills | Status: AC | PRN

## 2020-03-05 NOTE — Patient Instructions (Signed)
[facility] at [facility] Discharge Instructions     Thank you for choosing [facility] at [facility] to provide your oncology and hematology care.  To afford each patient quality time with our provider, please arrive at least 15 minutes before your scheduled appointment time.   If you have a lab appointment with the Cancer Center please come in thru the Main Entrance and check in at the main information desk.  You need to re-schedule your appointment should you arrive 10 or more minutes late.  We strive to give you quality time with our providers, and arriving late affects you and other patients whose appointments are after yours.  Also, if you no show three or more times for appointments you may be dismissed from the clinic at the providers discretion.     Again, thank you for choosing [facility].  Our hope is that these requests will decrease the amount of time that you wait before being seen by our physicians.       _____________________________________________________________  Should you have questions after your visit to [facility], please contact our office at [phone] between the hours of 8:00 a.m. and 4:30 p.m.  Voicemails left after 4:00 p.m. will not be returned until the following business day.  For prescription refill requests, have your pharmacy contact our office and allow 72 hours.    Due to Covid, you will need to wear a mask upon entering the hospital. If you do not have a mask, a mask will be given to you at the Main Entrance upon arrival. For doctor visits, patients may have 1 support person with them. For treatment visits, patients can not have anyone with them due to social distancing guidelines and our immunocompromised population.      

## 2020-03-05 NOTE — Progress Notes (Signed)
[place], [place] [address]   CLINIC:  Medical Oncology/Hematology  PCP:  Cory Munch, PA-C [place] [address] [phone]   REASON FOR VISIT: Follow-up for cholangiocarcinoma metastatic to the liver.  CURRENT THERAPY: Starting FOLFOX on 03/19/2020  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[place])  02/08/2019 Initial Diagnosis   Cholangiocarcinoma metastatic to liver ([place])   02/22/2019 -  Chemotherapy   The patient had palonosetron (ALOXI) injection 0.25 mg, 0.25 mg, Intravenous,  Once, 10 of 10 cycles Administration: 0.25 mg (02/22/2019), 0.25 mg (03/01/2019), 0.25 mg (03/15/2019), 0.25 mg (03/22/2019), 0.25 mg (04/06/2019), 0.25 mg (04/13/2019), 0.25 mg (04/27/2019), 0.25 mg (05/04/2019), 0.25 mg (05/18/2019), 0.25 mg (05/25/2019), 0.25 mg (06/16/2019), 0.25 mg (06/22/2019), 0.25 mg (07/13/2019), 0.25 mg (07/20/2019), 0.25 mg (08/10/2019), 0.25 mg (08/24/2019), 0.25 mg (09/07/2019), 0.25 mg (09/14/2019) pegfilgrastim-cbqv (UDENYCA) injection 6 mg, 6 mg, Subcutaneous, Once, 10 of 10 cycles Administration: 6 mg (03/02/2019), 6 mg (03/24/2019), 6 mg (05/05/2019), 6 mg (05/26/2019), 6 mg (06/23/2019), 6 mg (07/21/2019), 6 mg (08/25/2019), 6 mg (09/15/2019) CISplatin (PLATINOL) 52 mg in sodium chloride 0.9 % 250 mL chemo infusion, 25 mg/m2 = 52 mg, Intravenous,  Once, 10 of 10 cycles Dose modification: 20 mg/m2 (80 % of original dose 25 mg/m2, Cycle 9, Reason: Other (see comments), Comment: renal insufficiency) Administration: 52 mg (02/22/2019), 52 mg (03/01/2019), 52 mg (03/15/2019), 52 mg (03/22/2019), 52 mg (04/06/2019), 52 mg (04/13/2019), 52 mg (04/27/2019), 52 mg (05/04/2019), 52 mg (05/18/2019), 52 mg (05/25/2019), 52 mg (06/16/2019), 52 mg (06/22/2019), 52 mg (07/13/2019), 52 mg (07/20/2019), 52 mg (08/10/2019), 52 mg (08/24/2019), 42 mg (09/07/2019), 42 mg (09/14/2019) gemcitabine (GEMZAR) 2,000 mg in sodium chloride 0.9  % 250 mL chemo infusion, 2,090 mg, Intravenous,  Once, 10 of 10 cycles Dose modification: 750 mg/m2 (75 % of original dose 1,000 mg/m2, Cycle 4, Reason: Other (see comments), Comment: neutropenia), 750 mg/m2 (75 % of original dose 1,000 mg/m2, Cycle 7, Reason: Other (see comments), Comment: neutripenia) Administration: 2,000 mg (02/22/2019), 2,000 mg (03/01/2019), 2,000 mg (03/15/2019), 2,000 mg (03/22/2019), 2,000 mg (04/06/2019), 2,000 mg (04/13/2019), 2,000 mg (04/27/2019), 1,558 mg (05/04/2019), 2,000 mg (05/18/2019), 2,000 mg (05/25/2019), 2,000 mg (06/16/2019), 2,000 mg (06/22/2019), 2,000 mg (07/13/2019), 1,558 mg (07/20/2019), 2,000 mg (08/10/2019), 2,000 mg (08/24/2019), 2,000 mg (09/07/2019), 2,000 mg (09/14/2019) fosaprepitant (EMEND) 150 mg, dexamethasone (DECADRON) 12 mg in sodium chloride 0.9 % 145 mL IVPB, , Intravenous,  Once, 10 of 10 cycles Administration:  (02/22/2019),  (03/01/2019),  (03/15/2019),  (03/22/2019),  (04/06/2019),  (04/13/2019),  (04/27/2019),  (05/04/2019),  (05/18/2019),  (05/25/2019),  (06/16/2019),  (06/22/2019),  (07/13/2019),  (07/20/2019),  (08/10/2019),  (08/24/2019),  (09/07/2019),  (09/14/2019)  for chemotherapy treatment.        INTERVAL HISTORY:  Nicole Ibarra 64 y.o. female returns for routine follow-up for cholangiocarcinoma.  Patient reports she is unable to eat.  She reports it is due from loss of appetite rather than nausea.  She denies any vomiting.  She does report abdominal pain that is relieved some from lying down. Denies any nausea, vomiting, or diarrhea. Denies any new pains. Had not noticed any recent bleeding such as epistaxis, hematuria or hematochezia. Denies recent chest pain on exertion, shortness of breath on minimal exertion, pre-syncopal episodes, or palpitations. Denies any numbness or tingling in hands or feet. Denies any recent fevers, infections, or recent hospitalizations. Patient reports appetite at 25% and energy level at 0%.  REVIEW OF SYSTEMS:  Review of Systems    Constitutional: Positive for appetite change and fatigue.     PAST MEDICAL/SURGICAL HISTORY:  Past Medical History:  Diagnosis Date  . Anxiety   . Arthritis   . Cancer ([place]) 01/2019   Metastatic cholangiocarcinoma  . Depression   . Hypertension    Past Surgical History:  Procedure Laterality Date  . BILIARY STENT PLACEMENT N/A 01/24/2020   Procedure: BILIARY STENT PLACEMENT;  Surgeon: Rogene Houston, MD;  [facility];  Service: Endoscopy;  Laterality: N/A;  . COLONOSCOPY N/A 10/23/2016   Procedure: COLONOSCOPY;  Surgeon: Danie Binder, MD;  [facility];  Service: Endoscopy;  Laterality: N/A;  11:30 Am  . ERCP N/A 01/24/2020   Procedure: ENDOSCOPIC RETROGRADE CHOLANGIOPANCREATOGRAPHY (ERCP);  Surgeon: Rogene Houston, MD;  [facility];  Service: Endoscopy;  Laterality: N/A;  . POLYPECTOMY  10/23/2016   Procedure: POLYPECTOMY;  Surgeon: Danie Binder, MD;  [facility];  Service: Endoscopy;;  sigmoid colon polyp  . PORTACATH PLACEMENT Left 02/16/2019   Procedure: INSERTION PORT-A-CATH (attached catheter in left subclavian);  Surgeon: Virl Cagey, MD;  Location: [facility];  Service: General;  Laterality: Left;  . RT HIP SURGERY    . SPHINCTEROTOMY N/A 01/24/2020   Procedure: SPHINCTEROTOMY;  Surgeon: Rogene Houston, MD;  [facility];  Service: Endoscopy;  Laterality: N/A;  . TOTAL HIP ARTHROPLASTY Left 04/07/2016   Procedure: LEFT TOTAL HIP ARTHROPLASTY ANTERIOR APPROACH;  Surgeon: Paralee Cancel, MD;  Location: [facility];  Service: Orthopedics;  Laterality: Left;  Unsuccessful for Spinal, went to General     SOCIAL HISTORY:  Social History   Socioeconomic History  . Marital status: Single    Spouse name: Not on file  . Number of children: Not on file  . Years of education: Not on file  . Highest education level: Not on file  Occupational History  . Not on file  Tobacco Use  . Smoking status: Current Every Day  Smoker    Packs/day: 0.25    Years: 42.00    Pack years: 10.50  . Smokeless tobacco: Never Used  Substance and Sexual Activity  . Alcohol use: No  . Drug use: No  . Sexual activity: Not Currently  Other Topics Concern  . Not on file  Social History Narrative  . Not on file   Social Determinants of Health   Financial Resource Strain:   . Difficulty of Paying Living Expenses:   Food Insecurity:   . Worried About Charity fundraiser in the Last Year:   . Arboriculturist in the Last Year:   Transportation Needs:   . Film/video editor (Medical):   Marland Kitchen Lack of Transportation (Non-Medical):   Physical Activity:   . Days of Exercise per Week:   . Minutes of Exercise per Session:   Stress:   . Feeling of Stress :   Social Connections:   . Frequency of Communication with Friends and Family:   . Frequency of Social Gatherings with Friends and Family:   . Attends Religious Services:   . Active Member of Clubs or Organizations:   . Attends Archivist Meetings:   Marland Kitchen Marital Status:   Intimate Partner Violence:   . Fear of Current or Ex-Partner:   . Emotionally Abused:   Marland Kitchen Physically Abused:   . Sexually Abused:     FAMILY HISTORY:  Family History  Problem Relation Age of Onset  . Hypertension Mother   . Cancer Father   . Hypertension Brother   . Cancer Brother   . Hypertension Sister   . Cancer Sister     CURRENT MEDICATIONS:  Outpatient Encounter Medications as of 03/05/2020  Medication Sig  . amLODipine (NORVASC) 10 MG tablet TAKE 1 TABLET BY MOUTH EVERY DAY  . doxazosin (CARDURA) 2 MG tablet Take 2 mg by mouth at bedtime.   . pantoprazole (PROTONIX) 40 MG tablet Take 1 tablet (40 mg total) by mouth daily before breakfast.  . polyethylene glycol powder (GLYCOLAX/MIRALAX) 17 GM/SCOOP powder Take 17 g by mouth daily. (Patient taking differently: Take 8.5 g by mouth daily. )  . potassium chloride (KLOR-CON) 10 MEQ tablet TAKE 2 TABLETS (20 MEQ TOTAL) BY MOUTH  2 (TWO) TIMES DAILY. (Patient taking differently: Take 20 mEq by mouth 2 (two) times daily. )  . vitamin B-12 (CYANOCOBALAMIN) 1000 MCG tablet Take 1,000 mcg by mouth daily as needed (energy).  Marland Kitchen HYDROcodone-acetaminophen (NORCO) 7.5-325 MG tablet Take 1-2 tablets by mouth every 6 (six) hours as needed for moderate pain.  Marland Kitchen lidocaine (XYLOCAINE) 2 % solution Swish and swallow 1 tablespoon four times a day prn sore mouth (Patient not taking: Reported on 03/05/2020)  . lidocaine-prilocaine (EMLA) cream Apply to skin over port a cath one hour prior to chemotherapy appointment (Patient not taking: Reported on 02/20/2020)  . oxyCODONE (OXY IR/ROXICODONE) 5 MG immediate release tablet Take 1 tablet (5 mg total) by mouth every 4 (four) hours as needed for severe pain. (Patient not taking: Reported on 02/20/2020)  . prochlorperazine (COMPAZINE) 10 MG tablet TAKE 1 TABLET (10 MG TOTAL) BY MOUTH EVERY 6 (SIX) HOURS AS NEEDED (NAUSEA OR VOMITING). (Patient not taking: Reported on 03/05/2020)  . Simethicone (PHAZYME) 180 MG CAPS Take 1 capsule (180 mg total) by mouth 3 (three) times daily as needed. (Patient not taking: Reported on 03/05/2020)  . [DISCONTINUED] prochlorperazine (COMPAZINE) 10 MG tablet TAKE 1 TABLET (10 MG TOTAL) BY MOUTH EVERY 6 (SIX) HOURS AS NEEDED (NAUSEA OR VOMITING).   No facility-administered encounter medications on file as of 03/05/2020.    ALLERGIES:  No Known Allergies   PHYSICAL EXAM:  ECOG Performance status: 1  Vitals:   03/05/20 1259  BP: 140/73  Pulse: 80  Resp: 18  Temp: (!) 96.9 F (36.1 C)  SpO2: 100%   Filed Weights   03/05/20 1259  Weight: 189 lb 6.4 oz (85.9 kg)    Physical Exam Constitutional:      Appearance: Normal appearance. She is normal weight.  Cardiovascular:     Rate and Rhythm: Normal rate and regular rhythm.     Heart sounds: Normal heart sounds.  Pulmonary:     Effort: Pulmonary effort is normal.     Breath sounds: Normal breath sounds.    Abdominal:     General: Bowel sounds are normal.     Palpations: Abdomen is soft.  Musculoskeletal:        General: Normal range of motion.  Skin:    General: Skin is warm.  Neurological:     Mental Status: She is alert and oriented to person, place, and time. Mental status is at baseline.  Psychiatric:        Mood and Affect: Mood normal.        Behavior: Behavior normal.        Thought Content: Thought content normal.        Judgment: Judgment  normal.      LABORATORY DATA:  I have reviewed the labs as listed.  CBC    Component Value Date/Time   WBC 7.7 03/05/2020 1203   RBC 3.15 (L) 03/05/2020 1203   HGB 10.5 (L) 03/05/2020 1203   HCT 32.0 (L) 03/05/2020 1203   PLT 164 03/05/2020 1203   MCV 101.6 (H) 03/05/2020 1203   MCH 33.3 03/05/2020 1203   MCHC 32.8 03/05/2020 1203   RDW 14.0 03/05/2020 1203   LYMPHSABS 1.9 03/05/2020 1203   MONOABS 0.8 03/05/2020 1203   EOSABS 0.2 03/05/2020 1203   BASOSABS 0.0 03/05/2020 1203   CMP Latest Ref Rng & Units 03/05/2020 02/20/2020 02/06/2020  Glucose 70 - 99 mg/dL 127(H) 133(H) 163(H)  BUN 8 - 23 mg/dL 16 11 13   Creatinine 0.44 - 1.00 mg/dL 1.88(H) 1.77(H) 1.51(H)  Sodium 135 - 145 mmol/L 140 140 137  Potassium 3.5 - 5.1 mmol/L 3.6 3.4(L) 3.4(L)  Chloride 98 - 111 mmol/L 100 104 102  CO2 22 - 32 mmol/L 26 24 24   Calcium 8.9 - 10.3 mg/dL 9.6 9.2 9.2  Total Protein 6.5 - 8.1 g/dL 7.9 7.3 7.4  Total Bilirubin 0.3 - 1.2 mg/dL 2.7(H) 3.2(H) 7.1(H)  Alkaline Phos 38 - 126 U/L 397(H) 285(H) 415(H)  AST 15 - 41 U/L 24 32 61(H)  ALT 0 - 44 U/L 18 23 50(H)     I personally performed a face-to-face visit.  All questions were answered to patient's stated satisfaction. Encouraged patient to call with any new concerns or questions before his next visit to the cancer center and we can certain see him sooner, if needed.     ASSESSMENT & PLAN:   Cholangiocarcinoma metastatic to liver ([place]) 1.  Metastatic cholangiocarcinoma to the  liver: -9 cycles of gemcitabine and cisplatin from 02/22/2019 through 09/07/2019. -CT CAP on 12/27/2019 showed large cystic mass in the pelvis.  Stable portal hepatic adenopathy.  Mass along the gallbladder fossa difficult to measure based on orientation.  Small pulmonary nodules are stable. -Liver lesion biopsy was abandoned due to unable to visualize it with ultrasound. -There also unable to biopsy the pelvic mass. -Labs done on 03/05/2020 showed WBC 7.7, hemoglobin 10.5, platelets 164, magnesium 1.9, creatinine 1.88, potassium 3.6 -We will give her a liter of house fluids on Thursday of this week. -She will start FOLFOX in 2 weeks.  2.  Hyperbilirubinemia: -MRCP showed 1.9 cm soft tissue lesion obliterating the common bile duct. -ERCP and plastic stent placement in the right hepatic biliary system by Dr. Laural Golden on 01/24/2020. -Labs done on 03/05/2020 showed improvement of bilirubin from 3.2 previously now 2.7. -AST and ALT have normalized. -We will start FOLFOX in 2 weeks.  3.  CKD: -This is from prior cisplatin therapy. -Labs done on 03/05/2020 showed creatinine 1.88  4.  Abdominal pain: -She has some lower quadrant pain on and off. -She is taking hydrocodone 7.5 mg every 6 hours as needed. -She also reports lying down helps relieve some of this pain. -Systemic therapy will improve the pain.      Orders placed this encounter:  Orders Placed This Encounter  Procedures  . Lactate dehydrogenase  . Magnesium  . CBC with Differential/Platelet  . Comprehensive metabolic panel  . Vitamin B12  . VITAMIN D 25 Hydroxy (Vit-D Deficiency, Fractures)  . Folate  . Bilirubin, direct  . CEA      Francene Finders, FNP-C [place] [phone]

## 2020-03-05 NOTE — Progress Notes (Signed)
Orders placed for labs for today's appt.

## 2020-03-05 NOTE — Assessment & Plan Note (Signed)
1.  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 -CT CAP on 12/27/2019 showed large cystic mass in the pelvis.  Stable portal hepatic adenopathy.  Mass along the gallbladder fossa difficult to measure based on orientation.  Small pulmonary nodules are stable. -Liver lesion biopsy was abandoned due to unable to visualize it with ultrasound. -There also unable to biopsy the pelvic mass. -Labs done on 03/05/2020 showed WBC 7.7, hemoglobin 10.5, platelets 164, magnesium 1.9, creatinine 1.88, potassium 3.6 -We will give her a liter of house fluids on Thursday of this week. -She will start FOLFOX in 2 weeks.  2.  Hyperbilirubinemia: -MRCP showed 1.9 cm soft tissue lesion obliterating the common bile duct. -ERCP and plastic stent placement in the right hepatic biliary system by Dr. Laural Golden on 01/24/2020. -Labs done on 03/05/2020 showed improvement of bilirubin from 3.2 previously now 2.7. -AST and ALT have normalized. -We will start FOLFOX in 2 weeks.  3.  CKD: -This is from prior cisplatin therapy. -Labs done on 03/05/2020 showed creatinine 1.88  4.  Abdominal pain: -She has some lower quadrant pain on and off. -She is taking hydrocodone 7.5 mg every 6 hours as needed. -She also reports lying down helps relieve some of this pain. -Systemic therapy will improve the pain.

## 2020-03-07 ENCOUNTER — Inpatient Hospital Stay (HOSPITAL_COMMUNITY): Payer: Medicare Other | Attending: Hematology

## 2020-03-07 ENCOUNTER — Other Ambulatory Visit: Payer: Self-pay

## 2020-03-07 ENCOUNTER — Inpatient Hospital Stay (HOSPITAL_COMMUNITY): Payer: Medicare Other

## 2020-03-07 ENCOUNTER — Ambulatory Visit (HOSPITAL_COMMUNITY): Payer: Medicare Other | Admitting: Hematology

## 2020-03-07 VITALS — BP 145/73 | HR 68 | Temp 97.7°F | Resp 18

## 2020-03-07 DIAGNOSIS — R0602 Shortness of breath: Secondary | ICD-10-CM | POA: Insufficient documentation

## 2020-03-07 DIAGNOSIS — Z809 Family history of malignant neoplasm, unspecified: Secondary | ICD-10-CM | POA: Insufficient documentation

## 2020-03-07 DIAGNOSIS — N189 Chronic kidney disease, unspecified: Secondary | ICD-10-CM | POA: Diagnosis not present

## 2020-03-07 DIAGNOSIS — Z79899 Other long term (current) drug therapy: Secondary | ICD-10-CM | POA: Insufficient documentation

## 2020-03-07 DIAGNOSIS — E876 Hypokalemia: Secondary | ICD-10-CM | POA: Diagnosis not present

## 2020-03-07 DIAGNOSIS — Z8719 Personal history of other diseases of the digestive system: Secondary | ICD-10-CM | POA: Diagnosis not present

## 2020-03-07 DIAGNOSIS — C240 Malignant neoplasm of extrahepatic bile duct: Secondary | ICD-10-CM | POA: Diagnosis present

## 2020-03-07 DIAGNOSIS — C221 Intrahepatic bile duct carcinoma: Secondary | ICD-10-CM

## 2020-03-07 DIAGNOSIS — R200 Anesthesia of skin: Secondary | ICD-10-CM | POA: Diagnosis not present

## 2020-03-07 DIAGNOSIS — C787 Secondary malignant neoplasm of liver and intrahepatic bile duct: Secondary | ICD-10-CM | POA: Insufficient documentation

## 2020-03-07 DIAGNOSIS — R102 Pelvic and perineal pain: Secondary | ICD-10-CM | POA: Insufficient documentation

## 2020-03-07 DIAGNOSIS — R109 Unspecified abdominal pain: Secondary | ICD-10-CM | POA: Diagnosis not present

## 2020-03-07 DIAGNOSIS — Z596 Low income: Secondary | ICD-10-CM | POA: Insufficient documentation

## 2020-03-07 DIAGNOSIS — F17210 Nicotine dependence, cigarettes, uncomplicated: Secondary | ICD-10-CM | POA: Diagnosis not present

## 2020-03-07 DIAGNOSIS — K117 Disturbances of salivary secretion: Secondary | ICD-10-CM | POA: Insufficient documentation

## 2020-03-07 DIAGNOSIS — R112 Nausea with vomiting, unspecified: Secondary | ICD-10-CM | POA: Insufficient documentation

## 2020-03-07 DIAGNOSIS — R634 Abnormal weight loss: Secondary | ICD-10-CM | POA: Diagnosis not present

## 2020-03-07 DIAGNOSIS — Z5111 Encounter for antineoplastic chemotherapy: Secondary | ICD-10-CM | POA: Diagnosis present

## 2020-03-07 DIAGNOSIS — Z8249 Family history of ischemic heart disease and other diseases of the circulatory system: Secondary | ICD-10-CM | POA: Diagnosis not present

## 2020-03-07 DIAGNOSIS — R05 Cough: Secondary | ICD-10-CM | POA: Insufficient documentation

## 2020-03-07 DIAGNOSIS — M1990 Unspecified osteoarthritis, unspecified site: Secondary | ICD-10-CM | POA: Insufficient documentation

## 2020-03-07 MED ORDER — SODIUM CHLORIDE 0.9% FLUSH
10.0000 mL | Freq: Once | INTRAVENOUS | Status: AC | PRN
  Administered 2020-03-07: 10 mL

## 2020-03-07 MED ORDER — SODIUM CHLORIDE 0.9 % IV SOLN
Freq: Once | INTRAVENOUS | Status: AC
  Filled 2020-03-07: qty 1000

## 2020-03-07 MED ORDER — HEPARIN SOD (PORK) LOCK FLUSH 100 UNIT/ML IV SOLN
500.0000 [IU] | Freq: Once | INTRAVENOUS | Status: AC | PRN
  Administered 2020-03-07: 500 [IU]

## 2020-03-07 NOTE — Progress Notes (Signed)
[name] presents today for IV hydration per orders. Port accessed and fluids given as ordered over 2 hours. VSS prior to and after infusion. Port flushed and deaccessed per protocol. Pt discharged in satisfactory condition with follow up instructions.

## 2020-03-07 NOTE — Patient Instructions (Signed)
[facility] at [facility]  Discharge Instructions:  IV hydration received today. Follow up as scheduled. _______________________________________________________________  Thank you for choosing [facility] at [facility] to provide your oncology and hematology care.  To afford each patient quality time with our providers, please arrive at least 15 minutes before your scheduled appointment.  You need to re-schedule your appointment if you arrive 10 or more minutes late.  We strive to give you quality time with our providers, and arriving late affects you and other patients whose appointments are after yours.  Also, if you no show three or more times for appointments you may be dismissed from the clinic.  Again, thank you for choosing [facility] at [facility]. Our hope is that these requests will allow you access to exceptional care and in a timely manner. _______________________________________________________________  If you have questions after your visit, please contact our office at [phone] between the hours of 8:30 a.m. and 5:00 p.m. Voicemails left after 4:30 p.m. will not be returned until the following business day. _______________________________________________________________  For prescription refill requests, have your pharmacy contact our office. _______________________________________________________________  Recommendations made by the consultant and any test results will be sent to your referring physician. _______________________________________________________________ 

## 2020-03-14 ENCOUNTER — Other Ambulatory Visit (HOSPITAL_COMMUNITY): Payer: Self-pay | Admitting: Hematology

## 2020-03-14 NOTE — Progress Notes (Signed)
OFF PATHWAY REGIMEN - Other Dx  No Change  Continue With Treatment as Ordered.   [id number]:Cisplatin 25 mg/m2 D1,8 + Gemcitabine 1,000 mg/m2 D1,8 q21 Days:   A cycle is every 21 days:     Gemcitabine      Cisplatin   **Always confirm dose/schedule in your pharmacy ordering system**  Patient Characteristics: Intent of Therapy: Non-Curative / Palliative Intent, Discussed with Patient

## 2020-03-14 NOTE — Progress Notes (Signed)
DISCONTINUE OFF PATHWAY REGIMEN - Other Dx   [id number]:Cisplatin 25 mg/m2 D1,8 + Gemcitabine 1,000 mg/m2 D1,8 q21 Days:   A cycle is every 21 days:     Gemcitabine      Cisplatin   **Always confirm dose/schedule in your pharmacy ordering system**  REASON: Toxicities / Adverse Event PRIOR TREATMENT: Cisplatin 25 mg/m2 D1,8 + Gemcitabine 1,000 mg/m2 D1,8 q21 Days TREATMENT RESPONSE: Partial Response (PR)  START OFF PATHWAY REGIMEN - Other   [id number]:FOLFOX (q14d) **2 cycles per order sheet**:   A cycle is every 14 days:     Oxaliplatin      Leucovorin      Fluorouracil      Fluorouracil   **Always confirm dose/schedule in your pharmacy ordering system**  Patient Characteristics: Intent of Therapy: Non-Curative / Palliative Intent, Discussed with Patient

## 2020-03-18 NOTE — Patient Instructions (Addendum)
Oxaliplatin (Eloxatin)  About This Drug  Oxaliplatin is used to treat cancer. It is given in the vein (IV).  It takes two hours to infuse.  Possible Side Effects  . Bone marrow suppression. This is a decrease in the number of white blood cells, red blood cells, and platelets. This may raise your risk of infection, make you tired and weak (fatigue), and raise your risk of bleeding.  . Tiredness  . Soreness of the mouth and throat. You may have red areas, white patches, or sores that hurt.  . Nausea and vomiting (throwing up)  . Diarrhea (loose bowel movements)  . Changes in your liver function  . Effects on the nerves called peripheral neuropathy. You may feel numbness, tingling, or pain in your hands and feet, and may be worse in cold temperatures. It may be hard for you to button your clothes, open jars, or walk as usual. The effect on the nerves may get worse with more doses of the drug. These effects get better in some people after the drug is stopped but it does not get better in all people.  Note: Each of the side effects above was reported in 40% or greater of patients treated with oxaliplatin. Not all possible side effects are included above.   Warnings and Precautions  . Allergic reactions, including anaphylaxis, which may be life-threatening are rare but may happen in some patients. Signs of allergic reaction to this drug may be swelling of the face, feeling like your tongue or throat are swelling, trouble breathing, rash, itching, fever, chills, feeling dizzy, and/or feeling that your heart is beating in a fast or not normal way. If this happens, do not take another dose of this drug. You should get urgent medical treatment.  . Inflammation (swelling) of the lungs, which may be life-threatening. You may have a dry cough or trouble breathing.  . Effects on the nerves (neuropathy) may resolve within 14 days, or it may persist beyond 14 days.  . Severe decrease in white  blood cells when combined with the chemotherapy agents 5-fluorouracil and leucovorin. This may be life-threatening.  . Severe changes in your liver function  . Abnormal heart beat and/or EKG, which can be life-threatening  . Rhabdomyolysis- damage to your muscles which may release proteins in your blood and affect how your kidneys work, which can be life-threatening. You may have severe muscle weakness and/or pain, or dark urine.  Important Information  . This drug may impair your ability to drive or use machinery. Talk to your doctor and/or nurse about precautions you may need to take.  . This drug may be present in the saliva, tears, sweat, urine, stool, vomit, semen, and vaginal secretions. Talk to your doctor and/or your nurse about the necessary precautions to take during this time.  * The effects on the nerves can be aggravated by exposure to cold. Avoid cold beverages, use of ice, and make sure you cover your skin and dress warmly prior to being exposed to cold temperatures while you are receiving treatment with oxaliplatin*   Treating Side Effects  . Manage tiredness by pacing your activities for the day.  . Be sure to include periods of rest between energy-draining activities.  . To decrease the risk of infection, wash your hands regularly.  . Avoid close contact with people who have a cold, the flu, or other infections. . Take your temperature as your doctor or nurse tells you, and whenever you feel like you may have  a fever.  . To help decrease the risk of bleeding, use a soft toothbrush. Check with your nurse before using dental floss.  . Be very careful when using knives or tools.  . Use an electric shaver instead of a razor.  . Drink plenty of fluids (a minimum of eight glasses per day is recommended).  . Mouth care is very important. Your mouth care should consist of routine, gentle cleaning of your teeth or dentures and rinsing your mouth with a mixture of 1/2  teaspoon of salt in 8 ounces of water or 1/2 teaspoon of baking soda in 8 ounces of water. This should be done at least after each meal and at bedtime.  . If you have mouth sores, avoid mouthwash that has alcohol. Also avoid alcohol and smoking because they can bother your mouth and throat.  . To help with nausea and vomiting, eat small, frequent meals instead of three large meals a day. Choose foods and drinks that are at room temperature. Ask your nurse or doctor about other helpful tips and medicine that is available to help stop or lessen these symptoms.  . If you throw up or have loose bowel movements, you should drink more fluids so that you do not become dehydrated (lack of water in the body from losing too much fluid).  . If you have diarrhea, eat low-fiber foods that are high in protein and calories and avoid foods that can irritate your digestive tracts or lead to cramping.  . Ask your nurse or doctor about medicine that can lessen or stop your diarrhea.  . If you have numbness and tingling in your hands and feet, be careful when cooking, walking, and handling sharp objects and hot liquids.  . Do not drink cold drinks or use ice in beverages. Drink fluids at room temperature or warmer, and drink through a straw.  . Wear gloves to touch cold objects, and wear warm clothing and cover you skin during cold weather.   Food and Drug Interactions  . There are no known interactions of oxaliplatin with food and other medications.  . This drug may interact with other medicines. Tell your doctor and pharmacist about all the prescription and over-the-counter medicines and dietary supplements (vitamins, minerals, herbs and others) that you are taking at this time. Also, check with your doctor or pharmacist before starting any new prescription or over-the-counter medicines, or dietary supplements to make sure that there are no interactions   When to Call the Doctor  Call your doctor or nurse  if you have any of these symptoms and/or any new or unusual symptoms:  . Fever of 100.4 F (38 C) or higher  . Chills  . Tiredness that interferes with your daily activities  . Feeling dizzy or lightheaded  . Easy bleeding or bruising  . Feeling that your heart is beating in a fast or not normal way (palpitations)  . Pain in your chest  . Dry cough  . Trouble breathing  . Pain in your mouth or throat that makes it hard to eat or drink  . Nausea that stops you from eating or drinking and/or is not relieved by prescribed medicines  . Throwing up  . Diarrhea, 4 times in one day or diarrhea with lack of strength or a feeling of being dizzy  . Numbness, tingling, or pain in your hands and feet  . Signs of possible liver problems: dark urine, pale bowel movements, bad stomach pain, feeling very tired  and weak, unusual itching, or yellowing of the eyes or skin  . Signs of rhabdomyolysis: decreased urine, very dark urine, muscle pain in the shoulders, thighs, or lower back; muscle weakness or trouble moving arms and legs  . Signs of allergic reaction: swelling of the face, feeling like your tongue or throat are swelling, trouble breathing, rash, itching, fever, chills, feeling dizzy, and/or feeling that your heart is beating in a fast or not normal way. If this happens, call 911 for emergency care.  . If you think you may be pregnant  Reproduction Warnings  . Pregnancy warning: This drug may have harmful effects on the unborn baby. Women of childbearing potential should use effective methods of birth control during your cancer treatment. Let your doctor know right away if you think you may be pregnant or may have impregnated your partner.  . Breastfeeding warning: It is not known if this drug passes into breast milk. For this reason, women should talk to their doctor about the risks and benefits of breastfeeding during treatment with this drug because this drug may enter the breast  milk and cause harm to a breastfeeding baby.  . Fertility warning: Human fertility studies have not been done with this drug. Talk with your doctor or nurse if you plan to have children. Ask for information on sperm or egg banking.   Leucovorin Calcium  About This Drug  Leucovorin is a vitamin. It is used in combination with other cancer fighting drugs such as 5-fluorouracil and methotrexate. Leucovorin is given in the vein (IV).  This drug runs at the same time as the oxaliplatin and takes 2 hours to infuse.   Possible Side Effects . Rash and itching  Note: Leucovorin by itself has very few side effects. Other side effects you may have can be caused by the other drugs you are taking, such as 5-fluorouracil.   Warnings and Precautions  . Allergic reactions, including anaphylaxis are rare but may happen in some patients. Signs of allergic reaction to this drug may be swelling of the face, feeling like your tongue or throat are swelling, trouble breathing, rash, itching, fever, chills, feeling dizzy, and/or feeling that your heart is beating in a fast or not normal way. If this happens, do not take another dose of this drug. You should get urgent medical treatment.  Food and Drug Interactions  . There are no known interactions of leucovorin with food.  . This drug may interact with other medicines. Tell your doctor and pharmacist about all the prescription and over-the-counter medicines and dietary supplements (vitamins, minerals, herbs and others) that you are taking at this time.  . Also, check with your doctor or pharmacist before starting any new prescription or over-the-counter medicines, or dietary supplements to make sure that there are no interactions.   When to Call the Doctor  Call your doctor or nurse if you have any of these symptoms and/or any new or unusual symptoms:  . A new rash or a rash that is not relieved by prescribed medicines  . Signs of allergic reaction:  swelling of the face, feeling like your tongue or throat are swelling, trouble breathing, rash, itching, fever, chills, feeling dizzy, and/or feeling that your heart is beating in a fast or not normal way. If this happens, call 911 for emergency care.  . If you think you may be pregnant   Reproduction Warnings  . Pregnancy warning: It is not known if this drug may harm an unborn child.  For this reason, be sure to talk with your doctor if you are pregnant or planning to become pregnant while receiving this drug. Let your doctor know right away if you think you may be pregnant  . Breastfeeding warning: It is not known if this drug passes into breast milk. For this reason, women should talk to their doctor about the risks and benefits of breastfeeding during treatment with this drug because this drug may enter the breast milk and cause harm to a breastfeeding baby.  . Fertility warning: Human fertility studies have not been done with this drug. Talk with your doctor or nurse if you plan to have children. Ask for information on sperm or egg banking.   5-Fluorouracil (Adrucil; 5FU)  About This Drug  Fluorouracil is used to treat cancer. It is given in the vein (IV). It is given as an IV push from a syringe and also as a continuous infusion given via an ambulatory pump (a pump you take home and wear for a specified amount of time).  Possible Side Effects  . Bone marrow suppression. This is a decrease in the number of white blood cells, red blood cells, and platelets. This may raise your risk of infection, make you tired and weak (fatigue), and raise your risk of bleeding  . Changes in the tissue of the heart and/or heart attack. Some changes may happen that can cause your heart to have less ability to pump blood.  . Blurred vision or other changes in eyesight  . Nausea and throwing up (vomiting)  . Diarrhea (loose bowel movements)  . Ulcers - sores that may cause pain or bleeding in your  digestive tract, which includes your mouth, esophagus, stomach, small/large intestines and rectum  . Soreness of the mouth and throat. You may have red areas, white patches, or sores that hurt.  . Allergic reactions, including anaphylaxis are rare but may happen in some patients. Signs of allergic reaction to this drug may be swelling of the face, feeling like your tongue or throat are swelling, trouble breathing, rash, itching, fever, chills, feeling dizzy, and/or feeling that your heart is beating in a fast or not normal way. If this happens, do not take another dose of this drug. You should get urgent medical treatment.  . Sensitivity to light (photosensitivity). Photosensitivity means that you may become more sensitive to the sun and/or light. You may get a skin rash/reaction if you are in the sun or are exposed to sun lamps and tanning beds. Your eyes may water more, mostly in bright light.  . Changes in your nail color, nail loss and/or brittle nail  . Darkening of the skin, or changes to the color of your skin and/or veins used for infusion  . Rash, dry skin, or itching  Note: Not all possible side effects are included above.  Warnings and Precautions  . Hand-and-foot syndrome. The palms of your hands or soles of your feet may tingle, become numb, painful, swollen, or red.  . Changes in your central nervous system can happen. The central nervous system is made up of your brain and spinal cord. You could feel extreme tiredness, agitation, confusion, hallucinations (see or hear things that are not there), trouble understanding or speaking, loss of control of your bowels or bladder, eyesight changes, numbness or lack of strength to your arms, legs, face, or body, or coma. If you start to have any of these symptoms let your doctor know right away.  . Side effects  of this drug may be unexpectedly severe in some patients  Note: Some of the side effects above are very rare. If you have  concerns and/or questions, please discuss them with your medical team.   Important Information  . This drug may be present in the saliva, tears, sweat, urine, stool, vomit, semen, and vaginal secretions. Talk to your doctor and/or your nurse about the necessary precautions to take during this time.   Treating Side Effects  . Manage tiredness by pacing your activities for the day.  . Be sure to include periods of rest between energy-draining activities.  . To help decrease the risk of infections, wash your hands regularly.  . Avoid close contact with people who have a cold, the flu, or other infections.  . Take your temperature as your doctor or nurse tells you, and whenever you feel like you may have a fever.  . Use a soft toothbrush. Check with your nurse before using dental floss.  . Be very careful when using knives or tools.  . Use an electric shaver instead of a razor.  . If you have a nose bleed, sit with your head tipped slightly forward. Apply pressure by lightly pinching the bridge of your nose between your thumb and forefinger. Call your doctor if you feel dizzy or faint or if the bleeding doesn't stop after 10 to 15 minutes.  . Drink plenty of fluids (a minimum of eight glasses per day is recommended).  . If you throw up or have loose bowel movements, you should drink more fluids so that you do not  become dehydrated (lack of water in the body from losing too much fluid).  . To help with nausea and vomiting, eat small, frequent meals instead of three large meals a day. Choose foods and drinks that are at room temperature. Ask your nurse or doctor about other helpful tips and medicine that is available to help, stop, or lessen these symptoms.  . If you have diarrhea, eat low-fiber foods that are high in protein and calories and avoid foods that can irritate your digestive tracts or lead to cramping.  . Ask your nurse or doctor about medicine that can lessen or stop  your diarrhea.  . Mouth care is very important. Your mouth care should consist of routine, gentle cleaning of your teeth or dentures and rinsing your mouth with a mixture of 1/2 teaspoon of salt in 8 ounces of water or 1/2 teaspoon of baking soda in 8 ounces of water. This should be done at least after each meal and at bedtime.  . If you have mouth sores, avoid mouthwash that has alcohol. Also avoid alcohol and smoking because they can bother your mouth and throat.  Marland Kitchen Keeping your nails moisturized may help with brittleness.  . To help with itching, moisturize your skin several times day.  . Use sunscreen with SPF 30 or higher when you are outdoors even for a short time. Cover up when you are out in the sun. Wear wide-brimmed hats, long-sleeved shirts, and pants. Keep your neck, chest, and back covered. Wear dark sun glasses when in the sun or bright lights.  . If you get a rash do not put anything on it unless your doctor or nurse says you may. Keep the area around the rash clean and dry. Ask your doctor for medicine if your rash bothers you.  Marland Kitchen Keeping your pain under control is important to your well-being. Please tell your doctor or nurse  if you are experiencing pain.   Food and Drug Interactions  . There are no known interactions of fluorouracil with food.  . Check with your doctor or pharmacist about all other prescription medicines and over-the-counter medicines and dietary supplements (vitamins, minerals, herbs and others) you are taking before starting this medicine as there are known drug interactions with 5-fluoroucacil. Also, check with your doctor or pharmacist before starting any new prescription or over-the-counter medicines, or dietary supplements to make sure that there are no interactions.  When to Call the Doctor  Call your doctor or nurse if you have any of these symptoms and/or any new or unusual symptoms:  . Fever of 100.4 F (38 C) or higher  . Chills  . Easy  bleeding or bruising  . Nose bleed that doesn't stop bleeding after 10-15 minutes  . Trouble breathing  . Feeling dizzy or lightheaded  . Feeling that your heart is beating in a fast or not normal way (palpitations)  . Chest pain or symptoms of a heart attack. Most heart attacks involve pain in the center of the chest that lasts more than a few minutes. The pain may go away and come back or it can be constant. It can feel like pressure, squeezing, fullness, or pain. Sometimes pain is felt in one or both arms, the back, neck, jaw, or stomach. If any of these symptoms last 2 minutes, call 911.  Marland Kitchen Confusion and/or agitation  . Hallucinations  . Trouble understanding or speaking  . Loss of control of bowels or bladder  . Blurry vision or changes in your eyesight  . Headache that does not go away  . Numbness or lack of strength to your arms, legs, face, or body  . Nausea that stops you from eating or drinking and/or is not relieved by prescribed medicines  . Throwing up more than 3 times a day  . Diarrhea, 4 times in one day or diarrhea with lack of strength or a feeling of being dizzy  . Pain in your mouth or throat that makes it hard to eat or drink  . Pain along the digestive tract - especially if worse after eating  . Blood in your vomit (bright red or coffee-ground) and/or stools (bright red, or black/tarry)  . Coughing up blood  . Tiredness that interferes with your daily activities  . Painful, red, or swollen areas on your hands or feet or around your nails  . A new rash or a rash that is not relieved by prescribed medicines  . Develop sensitivity to sunlight/light  . Numbness and/or tingling of your hands and/or feet  . Signs of allergic reaction: swelling of the face, feeling like your tongue or throat are swelling, trouble breathing, rash, itching, fever, chills, feeling dizzy, and/or feeling that your heart is beating in a fast or not normal way. If this  happens, call 911 for emergency care.  . If you think you are pregnant or may have impregnated your partner  Reproduction Warnings  . Pregnancy warning: This drug may have harmful effects on the unborn baby. Women of child bearing potential should use effective methods of birth control during your cancer treatment and 3 months after treatment. Men with female partners of childbearing potential should use effective methods of birth control during your cancer treatment and for 3 months after your cancer treatment. Let your doctor know right away if you think you may be pregnant or may have impregnated your partner.  . Breastfeeding  warning: It is not known if this drug passes into breast milk. For this reason, Women should not breastfeed during treatment because this drug could enter the breast milk and cause harm to a breastfeeding baby.  . Fertility warning: In men and women both, this drug may affect your ability to have children in the future. Talk with your doctor or nurse if you plan to have children. Ask for information on sperm or egg banking.   SELF CARE ACTIVITIES WHILE RECEIVING CHEMOTHERAPY:  Hydration Increase your fluid intake 48 hours prior to treatment and drink at least 8 to 12 cups (64 ounces) of water/decaffeinated beverages per day after treatment. You can still have your cup of coffee or soda but these beverages do not count as part of your 8 to 12 cups that you need to drink daily. No alcohol intake.  Medications Continue taking your normal prescription medication as prescribed.  If you start any new herbal or new supplements please let us know first to make sure it is safe.  Mouth Care Have teeth cleaned professionally before starting treatment. Keep dentures and partial plates clean. Use soft toothbrush and do not use mouthwashes that contain alcohol. Biotene is a good mouthwash that is available at most pharmacies or may be ordered by calling [phone]. Use warm salt  water gargles (1 teaspoon salt per 1 quart warm water) before and after meals and at bedtime. If you need dental work, please let the doctor know before you go for your appointment so that we can coordinate the best possible time for you in regards to your chemo regimen. You need to also let your dentist know that you are actively taking chemo. We may need to do labs prior to your dental appointment.  Skin Care Always use sunscreen that has not expired and with SPF (Sun Protection Factor) of 50 or higher. Wear hats to protect your head from the sun. Remember to use sunscreen on your hands, ears, face, & feet.  Use good moisturizing lotions such as udder cream, eucerin, or even Vaseline. Some chemotherapies can cause dry skin, color changes in your skin and nails.    . Avoid long, hot showers or baths. . Use gentle, fragrance-free soaps and laundry detergent. . Use moisturizers, preferably creams or ointments rather than lotions because the thicker consistency is better at preventing skin dehydration. Apply the cream or ointment within 15 minutes of showering. Reapply moisturizer at night, and moisturize your hands every time after you wash them.  Hair Loss (if your doctor says your hair will fall out)  . If your doctor says that your hair is likely to fall out, decide before you begin chemo whether you want to wear a wig. You may want to shop before treatment to match your hair color. . Hats, turbans, and scarves can also camouflage hair loss, although some people prefer to leave their heads uncovered. If you go bare-headed outdoors, be sure to use sunscreen on your scalp. . Cut your hair short. It eases the inconvenience of shedding lots of hair, but it also can reduce the emotional impact of watching your hair fall out. . Don't perm or color your hair during chemotherapy. Those chemical treatments are already damaging to hair and can enhance hair loss. Once your chemo treatments are done and your  hair has grown back, it's OK to resume dyeing or perming hair.  With chemotherapy, hair loss is almost always temporary. But when it grows back, it may be  a different color or texture. In older adults who still had hair color before chemotherapy, the new growth may be completely gray.  Often, new hair is very fine and soft.  Infection Prevention Please wash your hands for at least 30 seconds using warm soapy water. Handwashing is the #1 way to prevent the spread of germs. Stay away from sick people or people who are getting over a cold. If you develop respiratory systems such as green/yellow mucus production or productive cough or persistent cough let us know and we will see if you need an antibiotic. It is a good idea to keep a pair of gloves on when going into grocery stores/[employer] to decrease your risk of coming into contact with germs on the carts, etc. Carry alcohol hand gel with you at all times and use it frequently if out in public. If your temperature reaches 100.5 or higher please call the clinic and let us know.  If it is after hours or on the weekend please go to the ER if your temperature is over 100.5.  Please have your own personal thermometer at home to use.    Sex and bodily fluids If you are going to have sex, a condom must be used to protect the person that isn't taking chemotherapy. Chemo can decrease your libido (sex drive). For a few days after chemotherapy, chemotherapy can be excreted through your bodily fluids.  When using the toilet please close the lid and flush the toilet twice.  Do this for a few day after you have had chemotherapy.   Effects of chemotherapy on your sex life Some changes are simple and won't last long. They won't affect your sex life permanently.  Sometimes you may feel: . too tired . not strong enough to be very active . sick or sore  . not in the mood . anxious or low Your anxiety might not seem related to sex. For example, you may be worried about  the cancer and how your treatment is going. Or you may be worried about money, or about how you family are coping with your illness.  These things can cause stress, which can affect your interest in sex. It's important to talk to your partner about how you feel.  Remember - the changes to your sex life don't usually last long. There's usually no medical reason to stop having sex during chemo. The drugs won't have any long term physical effects on your performance or enjoyment of sex. Cancer can't be passed on to your partner during sex  Contraception It's important to use reliable contraception during treatment. Avoid getting pregnant while you or your partner are having chemotherapy. This is because the drugs may harm the baby. Sometimes chemotherapy drugs can leave a man or woman infertile.  This means you would not be able to have children in the future. You might want to talk to someone about permanent infertility. It can be very difficult to learn that you may no longer be able to have children. Some people find counselling helpful. There might be ways to preserve your fertility, although this is easier for men than for women. You may want to speak to a fertility expert. You can talk about sperm banking or harvesting your eggs. You can also ask about other fertility options, such as donor eggs. If you have or have had breast cancer, your doctor might advise you not to take the contraceptive pill. This is because the hormones in it might affect the  cancer. It is not known for sure whether or not chemotherapy drugs can be passed on through semen or secretions from the vagina. Because of this some doctors advise people to use a barrier method if you have sex during treatment. This applies to vaginal, anal or oral sex. Generally, doctors advise a barrier method only for the time you are actually having the treatment and for about a week after your treatment. Advice like this can be worrying, but this does not  mean that you have to avoid being intimate with your partner. You can still have close contact with your partner and continue to enjoy sex.  Animals If you have cats or birds we just ask that you not change the litter or change the cage.  Please have someone else do this for you while you are on chemotherapy.   Food Safety During and After Cancer Treatment Food safety is important for people both during and after cancer treatment. Cancer and cancer treatments, such as chemotherapy, radiation therapy, and stem cell/bone marrow transplantation, often weaken the immune system. This makes it harder for your body to protect itself from foodborne illness, also called food poisoning. Foodborne illness is caused by eating food that contains harmful bacteria, parasites, or viruses.  Foods to avoid Some foods have a higher risk of becoming tainted with bacteria. These include: Marland Kitchen Unwashed fresh fruit and vegetables, especially leafy vegetables that can hide dirt and other contaminants . Raw sprouts, such as alfalfa sprouts . Raw or undercooked beef, especially ground beef, or other raw or undercooked meat and poultry . Fatty, fried, or spicy foods immediately before or after treatment.  These can sit heavy on your stomach and make you feel nauseous. . Raw or undercooked shellfish, such as oysters. . Sushi and sashimi, which often contain raw fish.  . Unpasteurized beverages, such as unpasteurized fruit juices, raw milk, raw yogurt, or cider . Undercooked eggs, such as soft boiled, over easy, and poached; raw, unpasteurized eggs; or foods made with raw egg, such as homemade raw cookie dough and homemade mayonnaise  Simple steps for food safety  Shop smart. . Do not buy food stored or displayed in an unclean area. . Do not buy bruised or damaged fruits or vegetables. . Do not buy cans that have cracks, dents, or bulges. . Pick up foods that can spoil at the end of your shopping trip and store them in a  cooler on the way home.  Prepare and clean up foods carefully. . Rinse all fresh fruits and vegetables under running water, and dry them with a clean towel or paper towel. . Clean the top of cans before opening them. . After preparing food, wash your hands for 20 seconds with hot water and soap. Pay special attention to areas between fingers and under nails. . Clean your utensils and dishes with hot water and soap. Marland Kitchen Disinfect your kitchen and cutting boards using 1 teaspoon of liquid, unscented bleach mixed into 1 quart of water.    Dispose of old food. . Eat canned and packaged food before its expiration date (the "use by" or "best before" date). . Consume refrigerated leftovers within 3 to 4 days. After that time, throw out the food. Even if the food does not smell or look spoiled, it still may be unsafe. Some bacteria, such as Listeria, can grow even on foods stored in the refrigerator if they are kept for too long.  Take precautions when eating out. . At restaurants,  avoid buffets and salad bars where food sits out for a long time and comes in contact with many people. Food can become contaminated when someone with a virus, often a norovirus, or another "bug" handles it. . Put any leftover food in a "to-go" container yourself, rather than having the server do it. And, refrigerate leftovers as soon as you get home. . Choose restaurants that are clean and that are willing to prepare your food as you order it cooked.   AT HOME MEDICATIONS:                                                                                                                                                                Compazine/Prochlorperazine 10mg  tablet. Take 1 tablet every 6 hours as needed for nausea/vomiting. (This can make you sleepy)   EMLA cream. Apply a quarter size amount to port site 1 hour prior to chemo. Do not rub in. Cover with plastic wrap.    Diarrhea Sheet   If you are having loose  stools/diarrhea, please purchase Imodium and begin taking as outlined:  At the first sign of poorly formed or loose stools you should begin taking Imodium (loperamide) 2 mg capsules.  Take two tablets (4mg ) followed by one tablet (2mg ) every 2 hours - DO NOT EXCEED 8 tablets in 24 hours.  If it is bedtime and you are having loose stools, take 2 tablets at bedtime, then 2 tablets every 4 hours until morning.   Always call the [facility] if you are having loose stools/diarrhea that you can't get under control.  Loose stools/diarrhea leads to dehydration (loss of water) in your body.  We have other options of trying to get the loose stools/diarrhea to stop but you must let us know!   Constipation Sheet  Colace - 100 mg capsules - take 2 capsules daily.  If this doesn't help then you can increase to 2 capsules twice daily.  Please call if the above does not work for you. Do not go more than 2 days without a bowel movement.  It is very important that you do not become constipated.  It will make you feel sick to your stomach (nausea) and can cause abdominal pain and vomiting.  Nausea Sheet   Compazine/Prochlorperazine 10mg  tablet. Take 1 tablet every 6 hours as needed for nausea/vomiting (This can make you drowsy).  If you are having persistent nausea (nausea that does not stop) please call the [facility] and let us know the amount of nausea that you are experiencing.  If you begin to vomit, you need to call the [facility] and if it is the weekend and you have vomited more than one time and can't get it to stop-go to the Emergency Room.  Persistent nausea/vomiting  can lead to dehydration (loss of fluid in your body) and will make you feel very weak and unwell. Ice chips, sips of clear liquids, foods that are at room temperature, crackers, and toast tend to be better tolerated.   SYMPTOMS TO REPORT AS SOON AS POSSIBLE AFTER TREATMENT:  FEVER GREATER THAN 100.5 F  CHILLS WITH OR WITHOUT  FEVER  NAUSEA AND VOMITING THAT IS NOT CONTROLLED WITH YOUR NAUSEA MEDICATION  UNUSUAL SHORTNESS OF BREATH  UNUSUAL BRUISING OR BLEEDING  TENDERNESS IN MOUTH AND THROAT WITH OR WITHOUT   PRESENCE OF ULCERS  URINARY PROBLEMS  BOWEL PROBLEMS  UNUSUAL RASH      Wear comfortable clothing and clothing appropriate for easy access to any Portacath or PICC line. Let us know if there is anything that we can do to make your therapy better!    What to do if you need assistance after hours or on the weekends: CALL [phone].  HOLD on the line, do not hang up.  You will hear multiple messages but at the end you will be connected with a nurse triage line.  They will contact the doctor if necessary.  Most of the time they will be able to assist you.  Do not call the hospital operator.      I have been informed and understand all of the instructions given to me and have received a copy. I have been instructed to call the clinic [phone] or my family physician as soon as possible for continued medical care, if indicated. I do not have any more questions at this time but understand that I may call the [facility] or the Patient Navigator at [phone] during office hours should I have questions or need assistance in obtaining follow-up care.      [facility] Discharge Instructions for Patients Receiving Chemotherapy  Today you received the following chemotherapy agents   To help prevent nausea and vomiting after your treatment, we encourage you to take your nausea medication    If you develop nausea and vomiting that is not controlled by your nausea medication, call the clinic.   BELOW ARE SYMPTOMS THAT SHOULD BE REPORTED IMMEDIATELY:  *FEVER GREATER THAN 100.5 F  *CHILLS WITH OR WITHOUT FEVER  NAUSEA AND VOMITING THAT IS NOT CONTROLLED WITH YOUR NAUSEA MEDICATION  *UNUSUAL SHORTNESS OF BREATH  *UNUSUAL BRUISING OR BLEEDING  TENDERNESS IN MOUTH AND  THROAT WITH OR WITHOUT PRESENCE OF ULCERS  *URINARY PROBLEMS  *BOWEL PROBLEMS  UNUSUAL RASH Items with * indicate a potential emergency and should be followed up as soon as possible.  Feel free to call the clinic should you have any questions or concerns. The clinic phone number is [phone].  Please show the [facility] at check-in to the Emergency Department and triage nurse.

## 2020-03-19 ENCOUNTER — Inpatient Hospital Stay (HOSPITAL_COMMUNITY): Payer: Medicare Other

## 2020-03-19 ENCOUNTER — Other Ambulatory Visit: Payer: Self-pay

## 2020-03-19 ENCOUNTER — Inpatient Hospital Stay (HOSPITAL_BASED_OUTPATIENT_CLINIC_OR_DEPARTMENT_OTHER): Payer: Medicare Other | Admitting: Hematology

## 2020-03-19 ENCOUNTER — Encounter (HOSPITAL_COMMUNITY): Payer: Self-pay | Admitting: Hematology

## 2020-03-19 VITALS — BP 149/68 | HR 82 | Temp 96.9°F | Resp 18 | Wt 186.4 lb

## 2020-03-19 VITALS — BP 130/67 | HR 73 | Temp 97.2°F | Resp 18

## 2020-03-19 DIAGNOSIS — C221 Intrahepatic bile duct carcinoma: Secondary | ICD-10-CM

## 2020-03-19 DIAGNOSIS — C787 Secondary malignant neoplasm of liver and intrahepatic bile duct: Secondary | ICD-10-CM

## 2020-03-19 DIAGNOSIS — E876 Hypokalemia: Secondary | ICD-10-CM

## 2020-03-19 DIAGNOSIS — Z5111 Encounter for antineoplastic chemotherapy: Secondary | ICD-10-CM | POA: Diagnosis not present

## 2020-03-19 LAB — COMPREHENSIVE METABOLIC PANEL
ALT: 25 U/L (ref 0–44)
AST: 56 U/L — ABNORMAL HIGH (ref 15–41)
Albumin: 2.9 g/dL — ABNORMAL LOW (ref 3.5–5.0)
Alkaline Phosphatase: 478 U/L — ABNORMAL HIGH (ref 38–126)
Anion gap: 13 (ref 5–15)
BUN: 12 mg/dL (ref 8–23)
CO2: 27 mmol/L (ref 22–32)
Calcium: 9.1 mg/dL (ref 8.9–10.3)
Chloride: 97 mmol/L — ABNORMAL LOW (ref 98–111)
Creatinine, Ser: 1.67 mg/dL — ABNORMAL HIGH (ref 0.44–1.00)
GFR calc Af Amer: 37 mL/min — ABNORMAL LOW (ref >60)
GFR calc non Af Amer: 32 mL/min — ABNORMAL LOW (ref >60)
Glucose, Bld: 125 mg/dL — ABNORMAL HIGH (ref 70–99)
Potassium: 2.7 mmol/L — CL (ref 3.5–5.1)
Sodium: 137 mmol/L (ref 135–145)
Total Bilirubin: 2.4 mg/dL — ABNORMAL HIGH (ref 0.3–1.2)
Total Protein: 7.5 g/dL (ref 6.5–8.1)

## 2020-03-19 LAB — CBC WITH DIFFERENTIAL/PLATELET
Abs Immature Granulocytes: 0.03 10*3/uL (ref 0.00–0.07)
Basophils Absolute: 0 10*3/uL (ref 0.0–0.1)
Basophils Relative: 0 %
Eosinophils Absolute: 0.1 10*3/uL (ref 0.0–0.5)
Eosinophils Relative: 1 %
HCT: 31.3 % — ABNORMAL LOW (ref 36.0–46.0)
Hemoglobin: 10.4 g/dL — ABNORMAL LOW (ref 12.0–15.0)
Immature Granulocytes: 0 %
Lymphocytes Relative: 36 %
Lymphs Abs: 2.7 10*3/uL (ref 0.7–4.0)
MCH: 33.1 pg (ref 26.0–34.0)
MCHC: 33.2 g/dL (ref 30.0–36.0)
MCV: 99.7 fL (ref 80.0–100.0)
Monocytes Absolute: 0.7 10*3/uL (ref 0.1–1.0)
Monocytes Relative: 9 %
Neutro Abs: 4 10*3/uL (ref 1.7–7.7)
Neutrophils Relative %: 54 %
Platelets: 209 10*3/uL (ref 150–400)
RBC: 3.14 MIL/uL — ABNORMAL LOW (ref 3.87–5.11)
RDW: 13.4 % (ref 11.5–15.5)
WBC: 7.4 10*3/uL (ref 4.0–10.5)
nRBC: 0 % (ref 0.0–0.2)

## 2020-03-19 LAB — VITAMIN D 25 HYDROXY (VIT D DEFICIENCY, FRACTURES): Vit D, 25-Hydroxy: 9.35 ng/mL — ABNORMAL LOW (ref 30–100)

## 2020-03-19 LAB — BILIRUBIN, DIRECT: Bilirubin, Direct: 1.2 mg/dL — ABNORMAL HIGH (ref 0.0–0.2)

## 2020-03-19 LAB — LACTATE DEHYDROGENASE: LDH: 146 U/L (ref 98–192)

## 2020-03-19 LAB — FOLATE: Folate: 2.7 ng/mL — ABNORMAL LOW (ref >5.9)

## 2020-03-19 LAB — MAGNESIUM: Magnesium: 2 mg/dL (ref 1.7–2.4)

## 2020-03-19 LAB — VITAMIN B12: Vitamin B-12: 6220 pg/mL — ABNORMAL HIGH (ref 180–914)

## 2020-03-19 MED ORDER — DEXTROSE 5 % IV SOLN: Freq: Once | INTRAVENOUS | Status: AC

## 2020-03-19 MED ORDER — FLUOROURACIL CHEMO INJECTION 2.5 GM/50ML
300.0000 mg/m2 | Freq: Once | INTRAVENOUS | Status: AC
  Administered 2020-03-19: 600 mg via INTRAVENOUS
  Filled 2020-03-19: qty 12

## 2020-03-19 MED ORDER — POTASSIUM CHLORIDE 10 MEQ/100ML IV SOLN
10.0000 meq | INTRAVENOUS | Status: AC
  Administered 2020-03-19 (×2): 10 meq via INTRAVENOUS
  Filled 2020-03-19 (×2): qty 100

## 2020-03-19 MED ORDER — POTASSIUM CHLORIDE CRYS ER 10 MEQ PO TBCR
20.0000 meq | EXTENDED_RELEASE_TABLET | ORAL | Status: AC
  Administered 2020-03-19 (×2): 20 meq via ORAL
  Filled 2020-03-19 (×2): qty 2

## 2020-03-19 MED ORDER — SODIUM CHLORIDE 0.9 % IV SOLN
10.0000 mg | Freq: Once | INTRAVENOUS | Status: AC
  Administered 2020-03-19: 10 mg via INTRAVENOUS
  Filled 2020-03-19: qty 10

## 2020-03-19 MED ORDER — SODIUM CHLORIDE 0.9% FLUSH
10.0000 mL | INTRAVENOUS | Status: DC | PRN
  Administered 2020-03-19: 10 mL

## 2020-03-19 MED ORDER — PALONOSETRON HCL INJECTION 0.25 MG/5ML
0.2500 mg | Freq: Once | INTRAVENOUS | Status: AC
  Administered 2020-03-19: 0.25 mg via INTRAVENOUS
  Filled 2020-03-19: qty 5

## 2020-03-19 MED ORDER — POTASSIUM CHLORIDE CRYS ER 20 MEQ PO TBCR: 20.0000 meq | EXTENDED_RELEASE_TABLET | ORAL | Status: DC

## 2020-03-19 MED ORDER — SODIUM CHLORIDE 0.9 % IV SOLN
1800.0000 mg/m2 | INTRAVENOUS | Status: DC
  Administered 2020-03-19: 3550 mg via INTRAVENOUS
  Filled 2020-03-19: qty 71

## 2020-03-19 MED ORDER — LEUCOVORIN CALCIUM INJECTION 350 MG
300.0000 mg/m2 | Freq: Once | INTRAVENOUS | Status: AC
  Administered 2020-03-19: 594 mg via INTRAVENOUS
  Filled 2020-03-19: qty 29.7

## 2020-03-19 MED ORDER — OXALIPLATIN CHEMO INJECTION 100 MG/20ML
63.7500 mg/m2 | Freq: Once | INTRAVENOUS | Status: AC
  Administered 2020-03-19: 125 mg via INTRAVENOUS
  Filled 2020-03-19: qty 20

## 2020-03-19 NOTE — Progress Notes (Signed)
[place], [place] [address]   CLINIC:  Medical Oncology/Hematology  PCP:  Cory Munch, PA-C [place] [address] [phone]   REASON FOR VISIT: Follow-up for cholangiocarcinoma to the liver.  CURRENT THERAPY: Observation.  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[place])   02/22/2019 - 10/19/2019 Chemotherapy   The patient had palonosetron (ALOXI) injection 0.25 mg, 0.25 mg, Intravenous,  Once, 10 of 10 cycles Administration: 0.25 mg (02/22/2019), 0.25 mg (03/01/2019), 0.25 mg (03/15/2019), 0.25 mg (03/22/2019), 0.25 mg (04/06/2019), 0.25 mg (04/13/2019), 0.25 mg (04/27/2019), 0.25 mg (05/04/2019), 0.25 mg (05/18/2019), 0.25 mg (05/25/2019), 0.25 mg (06/16/2019), 0.25 mg (06/22/2019), 0.25 mg (07/13/2019), 0.25 mg (07/20/2019), 0.25 mg (08/10/2019), 0.25 mg (08/24/2019), 0.25 mg (09/07/2019), 0.25 mg (09/14/2019) pegfilgrastim-cbqv (UDENYCA) injection 6 mg, 6 mg, Subcutaneous, Once, 10 of 10 cycles Administration: 6 mg (03/02/2019), 6 mg (03/24/2019), 6 mg (05/05/2019), 6 mg (05/26/2019), 6 mg (06/23/2019), 6 mg (07/21/2019), 6 mg (08/25/2019), 6 mg (09/15/2019) CISplatin (PLATINOL) 52 mg in sodium chloride 0.9 % 250 mL chemo infusion, 25 mg/m2 = 52 mg, Intravenous,  Once, 10 of 10 cycles Dose modification: 20 mg/m2 (80 % of original dose 25 mg/m2, Cycle 9, Reason: Other (see comments), Comment: renal insufficiency) Administration: 52 mg (02/22/2019), 52 mg (03/01/2019), 52 mg (03/15/2019), 52 mg (03/22/2019), 52 mg (04/06/2019), 52 mg (04/13/2019), 52 mg (04/27/2019), 52 mg (05/04/2019), 52 mg (05/18/2019), 52 mg (05/25/2019), 52 mg (06/16/2019), 52 mg (06/22/2019), 52 mg (07/13/2019), 52 mg (07/20/2019), 52 mg (08/10/2019), 52 mg (08/24/2019), 42 mg (09/07/2019), 42 mg (09/14/2019) gemcitabine (GEMZAR) 2,000 mg in sodium chloride 0.9 % 250 mL chemo  infusion, 2,090 mg, Intravenous,  Once, 10 of 10 cycles Dose modification: 750 mg/m2 (75 % of original dose 1,000 mg/m2, Cycle 4, Reason: Other (see comments), Comment: neutropenia), 750 mg/m2 (75 % of original dose 1,000 mg/m2, Cycle 7, Reason: Other (see comments), Comment: neutripenia) Administration: 2,000 mg (02/22/2019), 2,000 mg (03/01/2019), 2,000 mg (03/15/2019), 2,000 mg (03/22/2019), 2,000 mg (04/06/2019), 2,000 mg (04/13/2019), 2,000 mg (04/27/2019), 1,558 mg (05/04/2019), 2,000 mg (05/18/2019), 2,000 mg (05/25/2019), 2,000 mg (06/16/2019), 2,000 mg (06/22/2019), 2,000 mg (07/13/2019), 1,558 mg (07/20/2019), 2,000 mg (08/10/2019), 2,000 mg (08/24/2019), 2,000 mg (09/07/2019), 2,000 mg (09/14/2019) fosaprepitant (EMEND) 150 mg, dexamethasone (DECADRON) 12 mg in sodium chloride 0.9 % 145 mL IVPB, , Intravenous,  Once, 10 of 10 cycles Administration:  (02/22/2019),  (03/01/2019),  (03/15/2019),  (03/22/2019),  (04/06/2019),  (04/13/2019),  (04/27/2019),  (05/04/2019),  (05/18/2019),  (05/25/2019),  (06/16/2019),  (06/22/2019),  (07/13/2019),  (07/20/2019),  (08/10/2019),  (08/24/2019),  (09/07/2019),  (09/14/2019)  for chemotherapy treatment.    03/19/2020 -  Chemotherapy   The patient had palonosetron (ALOXI) injection 0.25 mg, 0.25 mg, Intravenous,  Once, 1 of 4 cycles Administration: 0.25 mg (03/19/2020) leucovorin 594 mg in dextrose 5 % 250 mL infusion, 300 mg/m2 = 594 mg (75 % of original dose 400 mg/m2), Intravenous,  Once, 1 of 4 cycles Dose modification: 300 mg/m2 (75 % of original dose 400 mg/m2, Cycle 1, Reason: Change in LFTs) Administration: 594 mg (03/19/2020) oxaliplatin (ELOXATIN) 125 mg in dextrose 5 % 500 mL chemo infusion, 63.75 mg/m2 = 125 mg (75 % of original dose 85 mg/m2), Intravenous,  Once, 1 of 4 cycles Dose modification: 63.75 mg/m2 (75 % of original dose 85 mg/m2, Cycle 1, Reason: Change in LFTs) Administration: 125  mg (03/19/2020) fluorouracil (ADRUCIL) chemo injection 600 mg, 300 mg/m2 = 600 mg (75 % of  original dose 400 mg/m2), Intravenous,  Once, 1 of 4 cycles Dose modification: 300 mg/m2 (75 % of original dose 400 mg/m2, Cycle 1, Reason: Change in LFTs) Administration: 600 mg (03/19/2020) fluorouracil (ADRUCIL) 3,550 mg in sodium chloride 0.9 % 79 mL chemo infusion, 1,800 mg/m2 = 3,550 mg (75 % of original dose 2,400 mg/m2), Intravenous, 1 Day/Dose, 1 of 4 cycles Dose modification: 1,800 mg/m2 (75 % of original dose 2,400 mg/m2, Cycle 1, Reason: Change in LFTs) Administration: 3,550 mg (03/19/2020)  for chemotherapy treatment.       INTERVAL HISTORY:  Ms. Morian 64 y.o. female seen for follow-up of cholangiocarcinoma and hyperbilirubinemia.  Reports suprapubic pain on coughing.  She vomited couple of times in the last 2 weeks.  Not taking potassium on regular basis as she throws up.  Appetite and energy levels are 25%.  Denies any fevers or chills.  REVIEW OF SYSTEMS:  Review of Systems  Gastrointestinal: Positive for nausea and vomiting.  All other systems reviewed and are negative.    PAST MEDICAL/SURGICAL HISTORY:  Past Medical History:  Diagnosis Date  . Anxiety   . Arthritis   . Cancer ([place]) 01/2019   Metastatic cholangiocarcinoma  . Depression   . Hypertension    Past Surgical History:  Procedure Laterality Date  . BILIARY STENT PLACEMENT N/A 01/24/2020   Procedure: BILIARY STENT PLACEMENT;  Surgeon: Rogene Houston, MD;  [facility];  Service: Endoscopy;  Laterality: N/A;  . COLONOSCOPY N/A 10/23/2016   Procedure: COLONOSCOPY;  Surgeon: Danie Binder, MD;  [facility];  Service: Endoscopy;  Laterality: N/A;  11:30 Am  . ERCP N/A 01/24/2020   Procedure: ENDOSCOPIC RETROGRADE CHOLANGIOPANCREATOGRAPHY (ERCP);  Surgeon: Rogene Houston, MD;  [facility];  Service: Endoscopy;  Laterality: N/A;  . POLYPECTOMY  10/23/2016   Procedure: POLYPECTOMY;  Surgeon: Danie Binder, MD;  [facility];  Service: Endoscopy;;  sigmoid colon  polyp  . PORTACATH PLACEMENT Left 02/16/2019   Procedure: INSERTION PORT-A-CATH (attached catheter in left subclavian);  Surgeon: Virl Cagey, MD;  Location: [facility];  Service: General;  Laterality: Left;  . RT HIP SURGERY    . SPHINCTEROTOMY N/A 01/24/2020   Procedure: SPHINCTEROTOMY;  Surgeon: Rogene Houston, MD;  [facility];  Service: Endoscopy;  Laterality: N/A;  . TOTAL HIP ARTHROPLASTY Left 04/07/2016   Procedure: LEFT TOTAL HIP ARTHROPLASTY ANTERIOR APPROACH;  Surgeon: Paralee Cancel, MD;  Location: [facility];  Service: Orthopedics;  Laterality: Left;  Unsuccessful for Spinal, went to General     SOCIAL HISTORY:  Social History   Socioeconomic History  . Marital status: Single    Spouse name: Not on file  . Number of children: Not on file  . Years of education: Not on file  . Highest education level: Not on file  Occupational History  . Not on file  Tobacco Use  . Smoking status: Current Every Day Smoker    Packs/day: 0.25    Years: 42.00    Pack years: 10.50  . Smokeless tobacco: Never Used  Substance and Sexual Activity  . Alcohol use: No  . Drug use: No  . Sexual activity: Not Currently  Other Topics Concern  . Not on file  Social History Narrative  . Not on file   Social Determinants of Health   Financial Resource Strain:   .  Difficulty of Paying Living Expenses:   Food Insecurity:   . Worried About Charity fundraiser in the Last Year:   . Arboriculturist in the Last Year:   Transportation Needs:   . Film/video editor (Medical):   Marland Kitchen Lack of Transportation (Non-Medical):   Physical Activity:   . Days of Exercise per Week:   . Minutes of Exercise per Session:   Stress:   . Feeling of Stress :   Social Connections:   . Frequency of Communication with Friends and Family:   . Frequency of Social Gatherings with Friends and Family:   . Attends Religious Services:   . Active Member of Clubs or Organizations:   . Attends Theatre manager Meetings:   Marland Kitchen Marital Status:   Intimate Partner Violence:   . Fear of Current or Ex-Partner:   . Emotionally Abused:   Marland Kitchen Physically Abused:   . Sexually Abused:     FAMILY HISTORY:  Family History  Problem Relation Age of Onset  . Hypertension Mother   . Cancer Father   . Hypertension Brother   . Cancer Brother   . Hypertension Sister   . Cancer Sister     CURRENT MEDICATIONS:  Outpatient Encounter Medications as of 03/19/2020  Medication Sig  . amLODipine (NORVASC) 10 MG tablet TAKE 1 TABLET BY MOUTH EVERY DAY  . doxazosin (CARDURA) 2 MG tablet Take 2 mg by mouth at bedtime.   Marland Kitchen HYDROcodone-acetaminophen (NORCO) 7.5-325 MG tablet Take 1-2 tablets by mouth every 6 (six) hours as needed for moderate pain.  Marland Kitchen lidocaine (XYLOCAINE) 2 % solution Swish and swallow 1 tablespoon four times a day prn sore mouth  . lidocaine-prilocaine (EMLA) cream Apply to skin over port a cath one hour prior to chemotherapy appointment  . oxyCODONE (OXY IR/ROXICODONE) 5 MG immediate release tablet Take 1 tablet (5 mg total) by mouth every 4 (four) hours as needed for severe pain.  . pantoprazole (PROTONIX) 40 MG tablet Take 1 tablet (40 mg total) by mouth daily before breakfast.  . polyethylene glycol powder (GLYCOLAX/MIRALAX) 17 GM/SCOOP powder Take 17 g by mouth daily. (Patient taking differently: Take 8.5 g by mouth daily. )  . potassium chloride (KLOR-CON) 10 MEQ tablet TAKE 2 TABLETS (20 MEQ TOTAL) BY MOUTH 2 (TWO) TIMES DAILY. (Patient taking differently: Take 20 mEq by mouth 2 (two) times daily. )  . prochlorperazine (COMPAZINE) 10 MG tablet TAKE 1 TABLET (10 MG TOTAL) BY MOUTH EVERY 6 (SIX) HOURS AS NEEDED (NAUSEA OR VOMITING).  . Simethicone (PHAZYME) 180 MG CAPS Take 1 capsule (180 mg total) by mouth 3 (three) times daily as needed.  . vitamin B-12 (CYANOCOBALAMIN) 1000 MCG tablet Take 1,000 mcg by mouth daily as needed (energy).  . [DISCONTINUED] prochlorperazine (COMPAZINE) 10 MG  tablet TAKE 1 TABLET (10 MG TOTAL) BY MOUTH EVERY 6 (SIX) HOURS AS NEEDED (NAUSEA OR VOMITING).   No facility-administered encounter medications on file as of 03/19/2020.    ALLERGIES:  No Known Allergies   PHYSICAL EXAM:  ECOG Performance status: 1  Vitals:   03/19/20 0838  BP: (!) 149/68  Pulse: 82  Resp: 18  Temp: (!) 96.9 F (36.1 C)  SpO2: 100%   Filed Weights   03/19/20 0838  Weight: 186 lb 6.4 oz (84.6 kg)    Physical Exam Constitutional:      Appearance: Normal appearance. She is normal weight.  Cardiovascular:     Rate and Rhythm: Normal rate  and regular rhythm.     Heart sounds: Normal heart sounds.  Pulmonary:     Effort: Pulmonary effort is normal.     Breath sounds: Normal breath sounds.  Abdominal:     General: Bowel sounds are normal.     Palpations: Abdomen is soft.  Musculoskeletal:        General: Normal range of motion.  Skin:    General: Skin is warm and dry.  Neurological:     Mental Status: She is alert and oriented to person, place, and time. Mental status is at baseline.  Psychiatric:        Mood and Affect: Mood normal.        Behavior: Behavior normal.        Thought Content: Thought content normal.        Judgment: Judgment normal.      LABORATORY DATA:  I have reviewed the labs as listed.  CBC    Component Value Date/Time   WBC 7.4 03/19/2020 0853   RBC 3.14 (L) 03/19/2020 0853   HGB 10.4 (L) 03/19/2020 0853   HCT 31.3 (L) 03/19/2020 0853   PLT 209 03/19/2020 0853   MCV 99.7 03/19/2020 0853   MCH 33.1 03/19/2020 0853   MCHC 33.2 03/19/2020 0853   RDW 13.4 03/19/2020 0853   LYMPHSABS 2.7 03/19/2020 0853   MONOABS 0.7 03/19/2020 0853   EOSABS 0.1 03/19/2020 0853   BASOSABS 0.0 03/19/2020 0853   CMP Latest Ref Rng & Units 03/19/2020 03/05/2020 02/20/2020  Glucose 70 - 99 mg/dL 125(H) 127(H) 133(H)  BUN 8 - 23 mg/dL 12 16 11   Creatinine 0.44 - 1.00 mg/dL 1.67(H) 1.88(H) 1.77(H)  Sodium 135 - 145 mmol/L 137 140 140   Potassium 3.5 - 5.1 mmol/L 2.7(LL) 3.6 3.4(L)  Chloride 98 - 111 mmol/L 97(L) 100 104  CO2 22 - 32 mmol/L 27 26 24   Calcium 8.9 - 10.3 mg/dL 9.1 9.6 9.2  Total Protein 6.5 - 8.1 g/dL 7.5 7.9 7.3  Total Bilirubin 0.3 - 1.2 mg/dL 2.4(H) 2.7(H) 3.2(H)  Alkaline Phos 38 - 126 U/L 478(H) 397(H) 285(H)  AST 15 - 41 U/L 56(H) 24 32  ALT 0 - 44 U/L 25 18 23     I have reviewed scans.   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  Metastatic cholangiocarcinoma to the liver: -CT CAP on 01/04/2020 showed large cystic mass in the pelvis and mass along gallbladder fossa.  Small pulmonary nodules.  Stable portal hepatic adenopathy. -Guardant 360 testing did not show any evidence of mutations.  Biopsy of the liver lesion could not be obtained. -I have recommended FOLFOX based chemotherapy in the second line setting. -We talked about side effects in detail.  Her bilirubin is still elevated at 2.4 with direct bilirubin of 1.2.  LFTs are mildly elevated. -I will cut back on the dose of FOLFOX by 25%.  I will reevaluate her in 1 week.  2.  Hyperbilirubinemia: -ERCP and plastic stent placement in the right hepatic biliary system on 01/24/2020. -Bilirubin today is 2.4 with direct bilirubin 1.2.  AST ALT are slightly elevated.  We will continue to monitor.  Bilirubin trending down.  3.  CKD: -This is from prior cisplatin therapy.  Creatinine is stable around 1.7-1.8.  4.  Abdominal pain: -She reports suprapubic pain on coughing.  This is likely from pelvic mass. -She will take hydrocodone 7.5 every 6 hours as needed.  Chemotherapy will likely help control the pain.  5.  Hypokalemia: -She has not been taking potassium on regular basis as she throws up sometimes.  Her potassium today is 2.7.  We will replete potassium levels.   Orders placed this encounter:  Orders Placed This Encounter  Procedures  . CBC with Differential/Platelet  . Comprehensive metabolic panel  .  Magnesium      Derek Jack, MD [place] [phone]

## 2020-03-19 NOTE — Patient Instructions (Signed)
[place] at [facility] Discharge Instructions  You were seen today by Dr. Delton Coombes. He went over your recent lab results. Be sure that you are taking your nausea medication as well as your potassium. Pick up imodium from your drug store to have on hand for diarrhea. Make sure that anything you eat and drink is at room temperature. He will see you back in 1 week for labs and follow up.   Thank you for choosing [place] at [facility] to provide your oncology and hematology care.  To afford each patient quality time with our provider, please arrive at least 15 minutes before your scheduled appointment time.   If you have a lab appointment with the [facility] please come in thru the  Main Entrance and check in at the main information desk  You need to re-schedule your appointment should you arrive 10 or more minutes late.  We strive to give you quality time with our providers, and arriving late affects you and other patients whose appointments are after yours.  Also, if you no show three or more times for appointments you may be dismissed from the clinic at the providers discretion.     Again, thank you for choosing [facility].  Our hope is that these requests will decrease the amount of time that you wait before being seen by our physicians.       _____________________________________________________________  Should you have questions after your visit to [facility], please contact our office at [phone] between the hours of 8:00 a.m. and 4:30 p.m.  Voicemails left after 4:00 p.m. will not be returned until the following business day.  For prescription refill requests, have your pharmacy contact our office and allow 72 hours.    Cancer Center Support Programs:   > Cancer Support Group  2nd Tuesday of the month 1pm-2pm, Journey Room

## 2020-03-19 NOTE — Progress Notes (Signed)
Patient has been assessed, vital signs and labs have been reviewed by Dr. Delton Coombes. ANC, Creatinine, LFTs, and Platelets are within treatment parameters, potassium is low at 2.7, please give 20 mEq PO, 51mEq IV and then 20mEq PO per Dr. Delton Coombes. The patient is good to proceed with treatment at this time.

## 2020-03-19 NOTE — Progress Notes (Signed)
Labs reviewed today with MD. Will give additional potassium today per orders.   Will get consent today and provide educational material for her new treatment today. Infusystem consent obtained and continuous pump video viewed by patient. Patient verbalizes understanding on pump and chemotherapy material. Spoke with sister some this afternoon and explained the cold sensitivity side effects regarding oxaliplatin.   Went over the pump in detail when I attached the  5FU today. She voiced she understood. Treatment given per orders. Patient tolerated it well without problems. Vitals stable and discharged home from clinic ambulatory. Follow up as scheduled.

## 2020-03-19 NOTE — Progress Notes (Signed)
CRITICAL VALUE ALERT Critical value received:  K+ 2.7 Date of notification:  03/19/20 Time of notification: 0922 Critical value read back:  YES Nurse who received alert:  TWJackson RN MD notified time and responseDelton Coombes  [phone] /  03/19/20

## 2020-03-19 NOTE — Assessment & Plan Note (Addendum)
1.  Metastatic cholangiocarcinoma to the liver: -CT CAP on 01/04/2020 showed large cystic mass in the pelvis and mass along gallbladder fossa.  Small pulmonary nodules.  Stable portal hepatic adenopathy. -Guardant 360 testing did not show any evidence of mutations.  Biopsy of the liver lesion could not be obtained. -I have recommended FOLFOX based chemotherapy in the second line setting. -We talked about side effects in detail.  Her bilirubin is still elevated at 2.4 with direct bilirubin of 1.2.  LFTs are mildly elevated. -I will cut back on the dose of FOLFOX by 25%.  I will reevaluate her in 1 week.  2.  Hyperbilirubinemia: -ERCP and plastic stent placement in the right hepatic biliary system on 01/24/2020. -Bilirubin today is 2.4 with direct bilirubin 1.2.  AST ALT are slightly elevated.  We will continue to monitor.  Bilirubin trending down.  3.  CKD: -This is from prior cisplatin therapy.  Creatinine is stable around 1.7-1.8.  4.  Abdominal pain: -She reports suprapubic pain on coughing.  This is likely from pelvic mass. -She will take hydrocodone 7.5 every 6 hours as needed.  Chemotherapy will likely help control the pain.  5.  Hypokalemia: -She has not been taking potassium on regular basis as she throws up sometimes.  Her potassium today is 2.7.  We will replete potassium levels.

## 2020-03-20 LAB — CEA: CEA: 36.9 ng/mL — ABNORMAL HIGH (ref 0.0–4.7)

## 2020-03-21 ENCOUNTER — Encounter (HOSPITAL_COMMUNITY): Payer: Self-pay

## 2020-03-21 ENCOUNTER — Inpatient Hospital Stay (HOSPITAL_COMMUNITY): Payer: Medicare Other

## 2020-03-21 ENCOUNTER — Other Ambulatory Visit: Payer: Self-pay

## 2020-03-21 VITALS — BP 145/78 | HR 83 | Temp 96.9°F | Resp 18

## 2020-03-21 DIAGNOSIS — Z5111 Encounter for antineoplastic chemotherapy: Secondary | ICD-10-CM | POA: Diagnosis not present

## 2020-03-21 DIAGNOSIS — C221 Intrahepatic bile duct carcinoma: Secondary | ICD-10-CM

## 2020-03-21 MED ORDER — SODIUM CHLORIDE 0.9% FLUSH
10.0000 mL | INTRAVENOUS | Status: DC | PRN
  Administered 2020-03-21: 10 mL

## 2020-03-21 MED ORDER — HEPARIN SOD (PORK) LOCK FLUSH 100 UNIT/ML IV SOLN
500.0000 [IU] | Freq: Once | INTRAVENOUS | Status: AC | PRN
  Administered 2020-03-21: 500 [IU]

## 2020-03-21 NOTE — Patient Instructions (Signed)
[place] at [facility] Discharge Instructions  5FU pump discontinued today with portacath flushed per protocol. Follow-up as scheduled. Call clinic for any questions or concerns   Thank you for choosing [place] at [facility] to provide your oncology and hematology care.  To afford each patient quality time with our provider, please arrive at least 15 minutes before your scheduled appointment time.   If you have a lab appointment with the [facility] please come in thru the Main Entrance and check in at the main information desk.  You need to re-schedule your appointment should you arrive 10 or more minutes late.  We strive to give you quality time with our providers, and arriving late affects you and other patients whose appointments are after yours.  Also, if you no show three or more times for appointments you may be dismissed from the clinic at the providers discretion.     Again, thank you for choosing [facility].  Our hope is that these requests will decrease the amount of time that you wait before being seen by our physicians.       _____________________________________________________________  Should you have questions after your visit to [facility], please contact our office at [phone] between the hours of 8:00 a.m. and 4:30 p.m.  Voicemails left after 4:00 p.m. will not be returned until the following business day.  For prescription refill requests, have your pharmacy contact our office and allow 72 hours.    Due to Covid, you will need to wear a mask upon entering the hospital. If you do not have a mask, a mask will be given to you at the Main Entrance upon arrival. For doctor visits, patients may have 1 support person with them. For treatment visits, patients can not have anyone with them due to social distancing guidelines and our immunocompromised population.

## 2020-03-21 NOTE — Progress Notes (Signed)
Nicole Ibarra tolerated 5FU pump well without complaints or incident. 5FU pump discontinued with portacath flushed easily per protocol then de-accessed. VSS Pt discharged self ambulatory in satisfactory condition

## 2020-03-22 ENCOUNTER — Telehealth (HOSPITAL_COMMUNITY): Payer: Self-pay

## 2020-03-22 NOTE — Telephone Encounter (Signed)
24 hour follow up - patient is doing ok today. Has no appetitie still but no nausea, vomiting. No complaints . Encouraged her to push her self to try to eat. She does not want to drink any supplements at this time.

## 2020-03-25 ENCOUNTER — Other Ambulatory Visit: Payer: Self-pay

## 2020-03-25 ENCOUNTER — Inpatient Hospital Stay (HOSPITAL_COMMUNITY): Payer: Medicare Other

## 2020-03-25 ENCOUNTER — Inpatient Hospital Stay (HOSPITAL_BASED_OUTPATIENT_CLINIC_OR_DEPARTMENT_OTHER): Payer: Medicare Other | Admitting: Hematology

## 2020-03-25 ENCOUNTER — Encounter (HOSPITAL_COMMUNITY): Payer: Self-pay | Admitting: Hematology

## 2020-03-25 DIAGNOSIS — C221 Intrahepatic bile duct carcinoma: Secondary | ICD-10-CM

## 2020-03-25 DIAGNOSIS — C787 Secondary malignant neoplasm of liver and intrahepatic bile duct: Secondary | ICD-10-CM

## 2020-03-25 DIAGNOSIS — Z5111 Encounter for antineoplastic chemotherapy: Secondary | ICD-10-CM | POA: Diagnosis not present

## 2020-03-25 LAB — CBC WITH DIFFERENTIAL/PLATELET
Abs Immature Granulocytes: 0.02 10*3/uL (ref 0.00–0.07)
Basophils Absolute: 0 10*3/uL (ref 0.0–0.1)
Basophils Relative: 0 %
Eosinophils Absolute: 0.1 10*3/uL (ref 0.0–0.5)
Eosinophils Relative: 1 %
HCT: 32.4 % — ABNORMAL LOW (ref 36.0–46.0)
Hemoglobin: 10.4 g/dL — ABNORMAL LOW (ref 12.0–15.0)
Immature Granulocytes: 1 %
Lymphocytes Relative: 30 %
Lymphs Abs: 1.3 10*3/uL (ref 0.7–4.0)
MCH: 32.1 pg (ref 26.0–34.0)
MCHC: 32.1 g/dL (ref 30.0–36.0)
MCV: 100 fL (ref 80.0–100.0)
Monocytes Absolute: 0.1 10*3/uL (ref 0.1–1.0)
Monocytes Relative: 3 %
Neutro Abs: 2.9 10*3/uL (ref 1.7–7.7)
Neutrophils Relative %: 65 %
Platelets: 162 10*3/uL (ref 150–400)
RBC: 3.24 MIL/uL — ABNORMAL LOW (ref 3.87–5.11)
RDW: 13.4 % (ref 11.5–15.5)
WBC: 4.4 10*3/uL (ref 4.0–10.5)
nRBC: 0 % (ref 0.0–0.2)

## 2020-03-25 LAB — COMPREHENSIVE METABOLIC PANEL
ALT: 32 U/L (ref 0–44)
AST: 47 U/L — ABNORMAL HIGH (ref 15–41)
Albumin: 3.1 g/dL — ABNORMAL LOW (ref 3.5–5.0)
Alkaline Phosphatase: 437 U/L — ABNORMAL HIGH (ref 38–126)
Anion gap: 12 (ref 5–15)
BUN: 16 mg/dL (ref 8–23)
CO2: 28 mmol/L (ref 22–32)
Calcium: 9.3 mg/dL (ref 8.9–10.3)
Chloride: 98 mmol/L (ref 98–111)
Creatinine, Ser: 1.56 mg/dL — ABNORMAL HIGH (ref 0.44–1.00)
GFR calc Af Amer: 40 mL/min — ABNORMAL LOW (ref >60)
GFR calc non Af Amer: 35 mL/min — ABNORMAL LOW (ref >60)
Glucose, Bld: 123 mg/dL — ABNORMAL HIGH (ref 70–99)
Potassium: 3.7 mmol/L (ref 3.5–5.1)
Sodium: 138 mmol/L (ref 135–145)
Total Bilirubin: 2.1 mg/dL — ABNORMAL HIGH (ref 0.3–1.2)
Total Protein: 7.6 g/dL (ref 6.5–8.1)

## 2020-03-25 LAB — MAGNESIUM: Magnesium: 2.1 mg/dL (ref 1.7–2.4)

## 2020-03-25 NOTE — Progress Notes (Signed)
[place], [place] [address]   CLINIC:  Medical Oncology/Hematology  PCP:  Cory Munch, PA-C [place] [address] [phone]   REASON FOR VISIT: Follow-up for cholangiocarcinoma to the liver.  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[place])   02/22/2019 - 10/19/2019 Chemotherapy   The patient had palonosetron (ALOXI) injection 0.25 mg, 0.25 mg, Intravenous,  Once, 10 of 10 cycles Administration: 0.25 mg (02/22/2019), 0.25 mg (03/01/2019), 0.25 mg (03/15/2019), 0.25 mg (03/22/2019), 0.25 mg (04/06/2019), 0.25 mg (04/13/2019), 0.25 mg (04/27/2019), 0.25 mg (05/04/2019), 0.25 mg (05/18/2019), 0.25 mg (05/25/2019), 0.25 mg (06/16/2019), 0.25 mg (06/22/2019), 0.25 mg (07/13/2019), 0.25 mg (07/20/2019), 0.25 mg (08/10/2019), 0.25 mg (08/24/2019), 0.25 mg (09/07/2019), 0.25 mg (09/14/2019) pegfilgrastim-cbqv (UDENYCA) injection 6 mg, 6 mg, Subcutaneous, Once, 10 of 10 cycles Administration: 6 mg (03/02/2019), 6 mg (03/24/2019), 6 mg (05/05/2019), 6 mg (05/26/2019), 6 mg (06/23/2019), 6 mg (07/21/2019), 6 mg (08/25/2019), 6 mg (09/15/2019) CISplatin (PLATINOL) 52 mg in sodium chloride 0.9 % 250 mL chemo infusion, 25 mg/m2 = 52 mg, Intravenous,  Once, 10 of 10 cycles Dose modification: 20 mg/m2 (80 % of original dose 25 mg/m2, Cycle 9, Reason: Other (see comments), Comment: renal insufficiency) Administration: 52 mg (02/22/2019), 52 mg (03/01/2019), 52 mg (03/15/2019), 52 mg (03/22/2019), 52 mg (04/06/2019), 52 mg (04/13/2019), 52 mg (04/27/2019), 52 mg (05/04/2019), 52 mg (05/18/2019), 52 mg (05/25/2019), 52 mg (06/16/2019), 52 mg (06/22/2019), 52 mg (07/13/2019), 52 mg (07/20/2019), 52 mg (08/10/2019), 52 mg (08/24/2019), 42 mg (09/07/2019), 42 mg (09/14/2019) gemcitabine (GEMZAR) 2,000 mg in sodium chloride 0.9 % 250 mL chemo  infusion, 2,090 mg, Intravenous,  Once, 10 of 10 cycles Dose modification: 750 mg/m2 (75 % of original dose 1,000 mg/m2, Cycle 4, Reason: Other (see comments), Comment: neutropenia), 750 mg/m2 (75 % of original dose 1,000 mg/m2, Cycle 7, Reason: Other (see comments), Comment: neutripenia) Administration: 2,000 mg (02/22/2019), 2,000 mg (03/01/2019), 2,000 mg (03/15/2019), 2,000 mg (03/22/2019), 2,000 mg (04/06/2019), 2,000 mg (04/13/2019), 2,000 mg (04/27/2019), 1,558 mg (05/04/2019), 2,000 mg (05/18/2019), 2,000 mg (05/25/2019), 2,000 mg (06/16/2019), 2,000 mg (06/22/2019), 2,000 mg (07/13/2019), 1,558 mg (07/20/2019), 2,000 mg (08/10/2019), 2,000 mg (08/24/2019), 2,000 mg (09/07/2019), 2,000 mg (09/14/2019) fosaprepitant (EMEND) 150 mg, dexamethasone (DECADRON) 12 mg in sodium chloride 0.9 % 145 mL IVPB, , Intravenous,  Once, 10 of 10 cycles Administration:  (02/22/2019),  (03/01/2019),  (03/15/2019),  (03/22/2019),  (04/06/2019),  (04/13/2019),  (04/27/2019),  (05/04/2019),  (05/18/2019),  (05/25/2019),  (06/16/2019),  (06/22/2019),  (07/13/2019),  (07/20/2019),  (08/10/2019),  (08/24/2019),  (09/07/2019),  (09/14/2019)  for chemotherapy treatment.    03/19/2020 -  Chemotherapy   The patient had palonosetron (ALOXI) injection 0.25 mg, 0.25 mg, Intravenous,  Once, 1 of 4 cycles Administration: 0.25 mg (03/19/2020) leucovorin 594 mg in dextrose 5 % 250 mL infusion, 300 mg/m2 = 594 mg (75 % of original dose 400 mg/m2), Intravenous,  Once, 1 of 4 cycles Dose modification: 300 mg/m2 (75 % of original dose 400 mg/m2, Cycle 1, Reason: Change in LFTs) Administration: 594 mg (03/19/2020) oxaliplatin (ELOXATIN) 125 mg in dextrose 5 % 500 mL chemo infusion, 63.75 mg/m2 = 125 mg (75 % of original dose 85 mg/m2), Intravenous,  Once, 1 of 4 cycles Dose modification: 63.75 mg/m2 (75 % of original dose 85 mg/m2, Cycle 1, Reason: Change in LFTs) Administration: 125  mg (03/19/2020) fluorouracil (ADRUCIL) chemo injection 600 mg, 300 mg/m2 = 600 mg (75 % of  original dose 400 mg/m2), Intravenous,  Once, 1 of 4 cycles Dose modification: 300 mg/m2 (75 % of original dose 400 mg/m2, Cycle 1, Reason: Change in LFTs) Administration: 600 mg (03/19/2020) fluorouracil (ADRUCIL) 3,550 mg in sodium chloride 0.9 % 79 mL chemo infusion, 1,800 mg/m2 = 3,550 mg (75 % of original dose 2,400 mg/m2), Intravenous, 1 Day/Dose, 1 of 4 cycles Dose modification: 1,800 mg/m2 (75 % of original dose 2,400 mg/m2, Cycle 1, Reason: Change in LFTs) Administration: 3,550 mg (03/19/2020)  for chemotherapy treatment.       INTERVAL HISTORY:  Nicole Ibarra 64 y.o. female seen for follow-up of cholangiocarcinoma and hyperbilirubinemia.  She started FOLFOX chemotherapy 1 week ago.  She felt nauseous and had vomited once yesterday.  She also developed sores inside her lips.  Denied any diarrhea.  No cold sensitivity reported.  Denies any worsening of numbness in the extremities.  REVIEW OF SYSTEMS:  Review of Systems  Gastrointestinal: Positive for nausea and vomiting.  Neurological: Positive for numbness.  All other systems reviewed and are negative.    PAST MEDICAL/SURGICAL HISTORY:  Past Medical History:  Diagnosis Date  . Anxiety   . Arthritis   . Cancer ([place]) 01/2019   Metastatic cholangiocarcinoma  . Depression   . Hypertension    Past Surgical History:  Procedure Laterality Date  . BILIARY STENT PLACEMENT N/A 01/24/2020   Procedure: BILIARY STENT PLACEMENT;  Surgeon: Rogene Houston, MD;  [facility];  Service: Endoscopy;  Laterality: N/A;  . COLONOSCOPY N/A 10/23/2016   Procedure: COLONOSCOPY;  Surgeon: Danie Binder, MD;  [facility];  Service: Endoscopy;  Laterality: N/A;  11:30 Am  . ERCP N/A 01/24/2020   Procedure: ENDOSCOPIC RETROGRADE CHOLANGIOPANCREATOGRAPHY (ERCP);  Surgeon: Rogene Houston, MD;  [facility];  Service: Endoscopy;  Laterality: N/A;  . POLYPECTOMY  10/23/2016   Procedure: POLYPECTOMY;  Surgeon: Danie Binder, MD;  [facility];  Service: Endoscopy;;  sigmoid colon polyp  . PORTACATH PLACEMENT Left 02/16/2019   Procedure: INSERTION PORT-A-CATH (attached catheter in left subclavian);  Surgeon: Virl Cagey, MD;  Location: [facility];  Service: General;  Laterality: Left;  . RT HIP SURGERY    . SPHINCTEROTOMY N/A 01/24/2020   Procedure: SPHINCTEROTOMY;  Surgeon: Rogene Houston, MD;  [facility];  Service: Endoscopy;  Laterality: N/A;  . TOTAL HIP ARTHROPLASTY Left 04/07/2016   Procedure: LEFT TOTAL HIP ARTHROPLASTY ANTERIOR APPROACH;  Surgeon: Paralee Cancel, MD;  Location: [facility];  Service: Orthopedics;  Laterality: Left;  Unsuccessful for Spinal, went to General     SOCIAL HISTORY:  Social History   Socioeconomic History  . Marital status: Single    Spouse name: Not on file  . Number of children: Not on file  . Years of education: Not on file  . Highest education level: Not on file  Occupational History  . Not on file  Tobacco Use  . Smoking status: Current Every Day Smoker    Packs/day: 0.25    Years: 42.00    Pack years: 10.50  . Smokeless tobacco: Never Used  Substance and Sexual Activity  . Alcohol use: No  . Drug use: No  . Sexual activity: Not Currently  Other Topics Concern  . Not on file  Social History Narrative  . Not on file   Social Determinants of Health  Financial Resource Strain:   . Difficulty of Paying Living Expenses:   Food Insecurity:   . Worried About Charity fundraiser in the Last Year:   . Arboriculturist in the Last Year:   Transportation Needs:   . Film/video editor (Medical):   Marland Kitchen Lack of Transportation (Non-Medical):   Physical Activity:   . Days of Exercise per Week:   . Minutes of Exercise per Session:   Stress:   . Feeling of Stress :   Social Connections:   . Frequency of Communication with Friends and Family:   . Frequency of Social Gatherings with Friends and Family:   . Attends Religious Services:     . Active Member of Clubs or Organizations:   . Attends Archivist Meetings:   Marland Kitchen Marital Status:   Intimate Partner Violence:   . Fear of Current or Ex-Partner:   . Emotionally Abused:   Marland Kitchen Physically Abused:   . Sexually Abused:     FAMILY HISTORY:  Family History  Problem Relation Age of Onset  . Hypertension Mother   . Cancer Father   . Hypertension Brother   . Cancer Brother   . Hypertension Sister   . Cancer Sister     CURRENT MEDICATIONS:  Outpatient Encounter Medications as of 03/25/2020  Medication Sig  . amLODipine (NORVASC) 10 MG tablet TAKE 1 TABLET BY MOUTH EVERY DAY  . doxazosin (CARDURA) 2 MG tablet Take 2 mg by mouth at bedtime.   . pantoprazole (PROTONIX) 40 MG tablet Take 1 tablet (40 mg total) by mouth daily before breakfast.  . potassium chloride (KLOR-CON) 10 MEQ tablet TAKE 2 TABLETS (20 MEQ TOTAL) BY MOUTH 2 (TWO) TIMES DAILY. (Patient taking differently: Take 20 mEq by mouth 2 (two) times daily. )  . HYDROcodone-acetaminophen (NORCO) 7.5-325 MG tablet Take 1-2 tablets by mouth every 6 (six) hours as needed for moderate pain. (Patient not taking: Reported on 03/25/2020)  . lidocaine (XYLOCAINE) 2 % solution Swish and swallow 1 tablespoon four times a day prn sore mouth (Patient not taking: Reported on 03/25/2020)  . lidocaine-prilocaine (EMLA) cream Apply to skin over port a cath one hour prior to chemotherapy appointment (Patient not taking: Reported on 03/25/2020)  . oxyCODONE (OXY IR/ROXICODONE) 5 MG immediate release tablet Take 1 tablet (5 mg total) by mouth every 4 (four) hours as needed for severe pain. (Patient not taking: Reported on 03/25/2020)  . polyethylene glycol powder (GLYCOLAX/MIRALAX) 17 GM/SCOOP powder Take 17 g by mouth daily. (Patient not taking: Reported on 03/25/2020)  . prochlorperazine (COMPAZINE) 10 MG tablet TAKE 1 TABLET (10 MG TOTAL) BY MOUTH EVERY 6 (SIX) HOURS AS NEEDED (NAUSEA OR VOMITING). (Patient not taking: Reported on  03/25/2020)  . Simethicone (PHAZYME) 180 MG CAPS Take 1 capsule (180 mg total) by mouth 3 (three) times daily as needed. (Patient not taking: Reported on 03/25/2020)  . vitamin B-12 (CYANOCOBALAMIN) 1000 MCG tablet Take 1,000 mcg by mouth daily as needed (energy).  . [DISCONTINUED] prochlorperazine (COMPAZINE) 10 MG tablet TAKE 1 TABLET (10 MG TOTAL) BY MOUTH EVERY 6 (SIX) HOURS AS NEEDED (NAUSEA OR VOMITING).   No facility-administered encounter medications on file as of 03/25/2020.    ALLERGIES:  No Known Allergies   PHYSICAL EXAM:  ECOG Performance status: 1  Vitals:   03/25/20 1320  BP: (!) 156/86  Pulse: 86  Resp: 19  Temp: (!) 96 F (35.6 C)  SpO2: 100%   Filed Weights  03/25/20 1320  Weight: 185 lb 12.8 oz (84.3 kg)    Physical Exam Constitutional:      Appearance: Normal appearance. She is normal weight.  Cardiovascular:     Rate and Rhythm: Normal rate and regular rhythm.     Heart sounds: Normal heart sounds.  Pulmonary:     Effort: Pulmonary effort is normal.     Breath sounds: Normal breath sounds.  Abdominal:     General: Bowel sounds are normal.     Palpations: Abdomen is soft.  Musculoskeletal:        General: Normal range of motion.  Skin:    General: Skin is warm and dry.  Neurological:     Mental Status: She is alert and oriented to person, place, and time. Mental status is at baseline.  Psychiatric:        Mood and Affect: Mood normal.        Behavior: Behavior normal.        Thought Content: Thought content normal.        Judgment: Judgment normal.      LABORATORY DATA:  I have reviewed the labs as listed.  CBC    Component Value Date/Time   WBC 4.4 03/25/2020 1225   RBC 3.24 (L) 03/25/2020 1225   HGB 10.4 (L) 03/25/2020 1225   HCT 32.4 (L) 03/25/2020 1225   PLT 162 03/25/2020 1225   MCV 100.0 03/25/2020 1225   MCH 32.1 03/25/2020 1225   MCHC 32.1 03/25/2020 1225   RDW 13.4 03/25/2020 1225   LYMPHSABS 1.3 03/25/2020 1225    MONOABS 0.1 03/25/2020 1225   EOSABS 0.1 03/25/2020 1225   BASOSABS 0.0 03/25/2020 1225   CMP Latest Ref Rng & Units 03/25/2020 03/19/2020 03/05/2020  Glucose 70 - 99 mg/dL 123(H) 125(H) 127(H)  BUN 8 - 23 mg/dL '16 12 16  '$ Creatinine 0.44 - 1.00 mg/dL 1.56(H) 1.67(H) 1.88(H)  Sodium 135 - 145 mmol/L 138 137 140  Potassium 3.5 - 5.1 mmol/L 3.7 2.7(LL) 3.6  Chloride 98 - 111 mmol/L 98 97(L) 100  CO2 22 - 32 mmol/L '28 27 26  '$ Calcium 8.9 - 10.3 mg/dL 9.3 9.1 9.6  Total Protein 6.5 - 8.1 g/dL 7.6 7.5 7.9  Total Bilirubin 0.3 - 1.2 mg/dL 2.1(H) 2.4(H) 2.7(H)  Alkaline Phos 38 - 126 U/L 437(H) 478(H) 397(H)  AST 15 - 41 U/L 47(H) 56(H) 24  ALT 0 - 44 U/L 32 25 18    I have independently reviewed scans.   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  Small pulmonary nodule stable portal hepatic adenopathy. -Guardant 360 did not show any targetable mutations.  Biopsy liver lesion could not be done due to technical difficulty. -Cycle 1 of FOLFOX with dose reduction on 03/19/2020. -She had occasional nausea and vomited once yesterday.  She had some sores on the inner side of the lips which are getting better. -I reviewed her labs.  AST and alk phos has mildly improved.  White count and platelets are normal. -She will come back in 1 week to initiate her cycle 2.  If the LFTs continue to improve, I will increase the dose of FOLFOX.  2.  Hyperbilirubinemia: -ERCP and plastic stent placement in the right hepatic biliary system on 01/24/2020. -Bilirubin has improved to 2.1 from 2.4 last week.  AST and alk  phos also improved slightly.  We will continue to monitor it closely.  3.  CKD: -This is from prior cisplatin therapy.  Creatinine today is 1.56.  4.  Abdominal pain: -She reports suprapubic pain on coughing.  This is likely from  pelvic mass. -She will use hydrocodone 7.5 mg every 6 hours as needed.  5.  Hypokalemia: -Potassium today is 3.7. -She will continue potassium 20 mEq twice daily at home.   Orders placed this encounter:  No orders of the defined types were placed in this encounter.     Derek Jack, MD [place] [phone]

## 2020-03-25 NOTE — Patient Instructions (Signed)
[facility] at [facility] Discharge Instructions  You were seen today by Dr. Katragadda. He went over your recent lab results. He will see you back in  for labs and follow up.   Thank you for choosing [facility] at [facility] to provide your oncology and hematology care.  To afford each patient quality time with our provider, please arrive at least 15 minutes before your scheduled appointment time.   If you have a lab appointment with the Cancer Center please come in thru the  Main Entrance and check in at the main information desk  You need to re-schedule your appointment should you arrive 10 or more minutes late.  We strive to give you quality time with our providers, and arriving late affects you and other patients whose appointments are after yours.  Also, if you no show three or more times for appointments you may be dismissed from the clinic at the providers discretion.     Again, thank you for choosing [facility].  Our hope is that these requests will decrease the amount of time that you wait before being seen by our physicians.       _____________________________________________________________  Should you have questions after your visit to [facility], please contact our office at [phone] between the hours of 8:00 a.m. and 4:30 p.m.  Voicemails left after 4:00 p.m. will not be returned until the following business day.  For prescription refill requests, have your pharmacy contact our office and allow 72 hours.    Cancer Center Support Programs:   > Cancer Support Group  2nd Tuesday of the month 1pm-2pm, Journey Room    

## 2020-03-25 NOTE — Assessment & Plan Note (Signed)
1.  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  Small pulmonary nodule stable portal hepatic adenopathy. -Guardant 360 did not show any targetable mutations.  Biopsy liver lesion could not be done due to technical difficulty. -Cycle 1 of FOLFOX with dose reduction on 03/19/2020. -She had occasional nausea and vomited once yesterday.  She had some sores on the inner side of the lips which are getting better. -I reviewed her labs.  AST and alk phos has mildly improved.  White count and platelets are normal. -She will come back in 1 week to initiate her cycle 2.  If the LFTs continue to improve, I will increase the dose of FOLFOX.  2.  Hyperbilirubinemia: -ERCP and plastic stent placement in the right hepatic biliary system on 01/24/2020. -Bilirubin has improved to 2.1 from 2.4 last week.  AST and alk phos also improved slightly.  We will continue to monitor it closely.  3.  CKD: -This is from prior cisplatin therapy.  Creatinine today is 1.56.  4.  Abdominal pain: -She reports suprapubic pain on coughing.  This is likely from pelvic mass. -She will use hydrocodone 7.5 mg every 6 hours as needed.  5.  Hypokalemia: -Potassium today is 3.7. -She will continue potassium 20 mEq twice daily at home.

## 2020-03-27 NOTE — Progress Notes (Signed)
.  Pharmacist Chemotherapy Monitoring - Follow Up Assessment    I verify that I have reviewed each item in the below checklist:  . Regimen for the patient is scheduled for the appropriate day and plan matches scheduled date. Marland Kitchen Appropriate non-routine labs are ordered dependent on drug ordered. . If applicable, additional medications reviewed and ordered per protocol based on lifetime cumulative doses and/or treatment regimen.   Plan for follow-up and/or issues identified: No . I-vent associated with next due treatment: No . MD and/or nursing notified: No  Nicole Ibarra 03/27/2020 3:10 PM

## 2020-03-28 ENCOUNTER — Other Ambulatory Visit (HOSPITAL_COMMUNITY): Payer: Self-pay | Admitting: *Deleted

## 2020-03-28 DIAGNOSIS — C787 Secondary malignant neoplasm of liver and intrahepatic bile duct: Secondary | ICD-10-CM

## 2020-03-28 DIAGNOSIS — C221 Intrahepatic bile duct carcinoma: Secondary | ICD-10-CM

## 2020-03-28 NOTE — Progress Notes (Signed)
Patient called clinic stating that she has been vomiting for several days. She is unable to keep anything but jello and peaches down. She has been trying to take her nausea medication and it comes right back up.  Dr. Delton Coombes wants patient to come in for fluids tomorrow with labs.  Patient is aware of appt and verbalizes appreciation.

## 2020-03-29 ENCOUNTER — Inpatient Hospital Stay (HOSPITAL_COMMUNITY): Payer: Medicare Other

## 2020-03-29 ENCOUNTER — Other Ambulatory Visit: Payer: Self-pay

## 2020-03-29 VITALS — BP 132/73 | HR 95 | Temp 96.6°F | Resp 16

## 2020-03-29 DIAGNOSIS — C787 Secondary malignant neoplasm of liver and intrahepatic bile duct: Secondary | ICD-10-CM

## 2020-03-29 DIAGNOSIS — C221 Intrahepatic bile duct carcinoma: Secondary | ICD-10-CM

## 2020-03-29 DIAGNOSIS — Z5111 Encounter for antineoplastic chemotherapy: Secondary | ICD-10-CM | POA: Diagnosis not present

## 2020-03-29 LAB — COMPREHENSIVE METABOLIC PANEL
ALT: 37 U/L (ref 0–44)
AST: 65 U/L — ABNORMAL HIGH (ref 15–41)
Albumin: 2.9 g/dL — ABNORMAL LOW (ref 3.5–5.0)
Alkaline Phosphatase: 426 U/L — ABNORMAL HIGH (ref 38–126)
Anion gap: 16 — ABNORMAL HIGH (ref 5–15)
BUN: 12 mg/dL (ref 8–23)
CO2: 23 mmol/L (ref 22–32)
Calcium: 9 mg/dL (ref 8.9–10.3)
Chloride: 98 mmol/L (ref 98–111)
Creatinine, Ser: 1.65 mg/dL — ABNORMAL HIGH (ref 0.44–1.00)
GFR calc Af Amer: 38 mL/min — ABNORMAL LOW (ref >60)
GFR calc non Af Amer: 32 mL/min — ABNORMAL LOW (ref >60)
Glucose, Bld: 143 mg/dL — ABNORMAL HIGH (ref 70–99)
Potassium: 3 mmol/L — ABNORMAL LOW (ref 3.5–5.1)
Sodium: 137 mmol/L (ref 135–145)
Total Bilirubin: 2.9 mg/dL — ABNORMAL HIGH (ref 0.3–1.2)
Total Protein: 7.5 g/dL (ref 6.5–8.1)

## 2020-03-29 LAB — CBC WITH DIFFERENTIAL/PLATELET
Abs Immature Granulocytes: 0.02 10*3/uL (ref 0.00–0.07)
Basophils Absolute: 0 10*3/uL (ref 0.0–0.1)
Basophils Relative: 0 %
Eosinophils Absolute: 0 10*3/uL (ref 0.0–0.5)
Eosinophils Relative: 0 %
HCT: 29.4 % — ABNORMAL LOW (ref 36.0–46.0)
Hemoglobin: 9.7 g/dL — ABNORMAL LOW (ref 12.0–15.0)
Immature Granulocytes: 0 %
Lymphocytes Relative: 18 %
Lymphs Abs: 1 10*3/uL (ref 0.7–4.0)
MCH: 32.6 pg (ref 26.0–34.0)
MCHC: 33 g/dL (ref 30.0–36.0)
MCV: 98.7 fL (ref 80.0–100.0)
Monocytes Absolute: 0.4 10*3/uL (ref 0.1–1.0)
Monocytes Relative: 8 %
Neutro Abs: 3.9 10*3/uL (ref 1.7–7.7)
Neutrophils Relative %: 74 %
Platelets: 116 10*3/uL — ABNORMAL LOW (ref 150–400)
RBC: 2.98 MIL/uL — ABNORMAL LOW (ref 3.87–5.11)
RDW: 13.4 % (ref 11.5–15.5)
WBC: 5.4 10*3/uL (ref 4.0–10.5)
nRBC: 0 % (ref 0.0–0.2)

## 2020-03-29 LAB — MAGNESIUM: Magnesium: 2 mg/dL (ref 1.7–2.4)

## 2020-03-29 MED ORDER — HEPARIN SOD (PORK) LOCK FLUSH 100 UNIT/ML IV SOLN
500.0000 [IU] | Freq: Once | INTRAVENOUS | Status: AC | PRN
  Administered 2020-03-29: 500 [IU]

## 2020-03-29 MED ORDER — SODIUM CHLORIDE 0.9% FLUSH
10.0000 mL | Freq: Once | INTRAVENOUS | Status: AC | PRN
  Administered 2020-03-29: 10 mL

## 2020-03-29 MED ORDER — ONDANSETRON 8 MG PO TBDP: 8.0000 mg | ORAL_TABLET | Freq: Three times a day (TID) | ORAL | 0 refills | Status: AC | PRN

## 2020-03-29 MED ORDER — SODIUM CHLORIDE 0.9 % IV SOLN
Freq: Once | INTRAVENOUS | Status: AC
  Administered 2020-03-29: 8 mg via INTRAVENOUS
  Filled 2020-03-29: qty 4

## 2020-03-29 MED ORDER — SODIUM CHLORIDE 0.9 % IV SOLN
Freq: Once | INTRAVENOUS | Status: AC
  Filled 2020-03-29: qty 1000

## 2020-03-29 NOTE — Progress Notes (Signed)
Nutrition Assessment   Reason for Assessment:  Weight loss, poor appetite   ASSESSMENT:  64 year old female with cholangiocarcinoma of liver.  Past medical history of HTN, depression.   Patient receiving folfox.    Met with patient during infusion.  Patient reports that "nothing taste right." Also reports saliva feels like it is collecting in back of throat.  Spit out several times during visit.  Reports that she has been taking some pedialyte, water, few sips of broth soup, jello, peaches.  Reports did not really eat anything yesterday. Has drank oral nutrition supplements in the past but not drinking them currently.  "Everything is coming back up."    Reports appetite has been down last 2-3 months but really down in the past few days.     Medications:  Lidocaine, compazine, protonix, miralax, KCL, vit b 12   Labs: K 3.9, glucose 143, creatinine 1.65   Anthropometrics:   Height: 64 inches Weight: 185 lb 11.8 oz Noted 02/20/20 199 lb  2/23 203 lb BMI: 30  9% weight loss in the last month, significant   Estimated Energy Needs  Kcals: 1800-2100 Protein: 90-105 g Fluid: > 1.8 L   NUTRITION DIAGNOSIS: Inadequate oral intake related to cancer related treatment as evidenced by 9% weight loss in the last month and decreased appetite   INTERVENTION:  Discussed strategies to help with taste change.  Handout provided. Discussed strategies to help with nausea.  Handout provided. Reviewed taking nausea medications.   Encouraged taking few bites sips q 30 minutes-1 hour.   Patient provided contact information  MONITORING, EVALUATION, GOAL:  Patient will consume adequate calories and protein to prevent weight loss   Next Visit: May 14, phone f/u  Abigayle Wilinski B. Zenia Resides, [place], [place] Registered Dietitian [phone] (pager)

## 2020-03-29 NOTE — Patient Instructions (Signed)
[facility] at [facility]  Discharge Instructions:   _______________________________________________________________  Thank you for choosing [facility] at [facility] to provide your oncology and hematology care.  To afford each patient quality time with our providers, please arrive at least 15 minutes before your scheduled appointment.  You need to re-schedule your appointment if you arrive 10 or more minutes late.  We strive to give you quality time with our providers, and arriving late affects you and other patients whose appointments are after yours.  Also, if you no show three or more times for appointments you may be dismissed from the clinic.  Again, thank you for choosing [facility] at [facility]. Our hope is that these requests will allow you access to exceptional care and in a timely manner. _______________________________________________________________  If you have questions after your visit, please contact our office at [phone] between the hours of 8:30 a.m. and 5:00 p.m. Voicemails left after 4:30 p.m. will not be returned until the following business day. _______________________________________________________________  For prescription refill requests, have your pharmacy contact our office. _______________________________________________________________  Recommendations made by the consultant and any test results will be sent to your referring physician. _______________________________________________________________ 

## 2020-03-29 NOTE — Progress Notes (Signed)
Patient came in today for labs and hydration fluids. Patient states she has been having trouble keeping anything down. She is nauseated a lot, some vomiting, she states when she takes a pill that she ends up vomiting it up. She is spitting out a lot of secretions, she says it just feels like she cannot keep them from coming back up. NP notified , will give IV zofran per orders and send in a script for zofran ODT for patient to try at home.     Treatment given per orders. Patient tolerated it well without problems. Vitals stable and discharged home from clinic via wheelchair. Follow up as scheduled.

## 2020-04-02 ENCOUNTER — Inpatient Hospital Stay (HOSPITAL_BASED_OUTPATIENT_CLINIC_OR_DEPARTMENT_OTHER): Payer: Medicare Other | Admitting: Hematology

## 2020-04-02 ENCOUNTER — Other Ambulatory Visit: Payer: Self-pay

## 2020-04-02 ENCOUNTER — Inpatient Hospital Stay (HOSPITAL_COMMUNITY): Payer: Medicare Other

## 2020-04-02 ENCOUNTER — Encounter (HOSPITAL_COMMUNITY): Payer: Self-pay | Admitting: Hematology

## 2020-04-02 VITALS — BP 132/62 | HR 96 | Temp 96.9°F | Resp 18 | Wt 181.0 lb

## 2020-04-02 DIAGNOSIS — E876 Hypokalemia: Secondary | ICD-10-CM

## 2020-04-02 DIAGNOSIS — C787 Secondary malignant neoplasm of liver and intrahepatic bile duct: Secondary | ICD-10-CM | POA: Diagnosis not present

## 2020-04-02 DIAGNOSIS — C221 Intrahepatic bile duct carcinoma: Secondary | ICD-10-CM

## 2020-04-02 DIAGNOSIS — Z5111 Encounter for antineoplastic chemotherapy: Secondary | ICD-10-CM | POA: Diagnosis not present

## 2020-04-02 LAB — COMPREHENSIVE METABOLIC PANEL
ALT: 44 U/L (ref 0–44)
AST: 79 U/L — ABNORMAL HIGH (ref 15–41)
Albumin: 2.9 g/dL — ABNORMAL LOW (ref 3.5–5.0)
Alkaline Phosphatase: 419 U/L — ABNORMAL HIGH (ref 38–126)
Anion gap: 17 — ABNORMAL HIGH (ref 5–15)
BUN: 11 mg/dL (ref 8–23)
CO2: 20 mmol/L — ABNORMAL LOW (ref 22–32)
Calcium: 9.1 mg/dL (ref 8.9–10.3)
Chloride: 98 mmol/L (ref 98–111)
Creatinine, Ser: 1.56 mg/dL — ABNORMAL HIGH (ref 0.44–1.00)
GFR calc Af Amer: 40 mL/min — ABNORMAL LOW (ref >60)
GFR calc non Af Amer: 35 mL/min — ABNORMAL LOW (ref >60)
Glucose, Bld: 138 mg/dL — ABNORMAL HIGH (ref 70–99)
Potassium: 2.7 mmol/L — CL (ref 3.5–5.1)
Sodium: 135 mmol/L (ref 135–145)
Total Bilirubin: 3.5 mg/dL — ABNORMAL HIGH (ref 0.3–1.2)
Total Protein: 7.5 g/dL (ref 6.5–8.1)

## 2020-04-02 LAB — CBC WITH DIFFERENTIAL/PLATELET
Abs Immature Granulocytes: 0.03 10*3/uL (ref 0.00–0.07)
Basophils Absolute: 0 10*3/uL (ref 0.0–0.1)
Basophils Relative: 1 %
Eosinophils Absolute: 0 10*3/uL (ref 0.0–0.5)
Eosinophils Relative: 1 %
HCT: 29.6 % — ABNORMAL LOW (ref 36.0–46.0)
Hemoglobin: 10 g/dL — ABNORMAL LOW (ref 12.0–15.0)
Immature Granulocytes: 1 %
Lymphocytes Relative: 43 %
Lymphs Abs: 1.6 10*3/uL (ref 0.7–4.0)
MCH: 33.6 pg (ref 26.0–34.0)
MCHC: 33.8 g/dL (ref 30.0–36.0)
MCV: 99.3 fL (ref 80.0–100.0)
Monocytes Absolute: 0.6 10*3/uL (ref 0.1–1.0)
Monocytes Relative: 16 %
Neutro Abs: 1.4 10*3/uL — ABNORMAL LOW (ref 1.7–7.7)
Neutrophils Relative %: 38 %
Platelets: 163 10*3/uL (ref 150–400)
RBC: 2.98 MIL/uL — ABNORMAL LOW (ref 3.87–5.11)
RDW: 14.5 % (ref 11.5–15.5)
WBC: 3.8 10*3/uL — ABNORMAL LOW (ref 4.0–10.5)
nRBC: 0 % (ref 0.0–0.2)

## 2020-04-02 LAB — MAGNESIUM: Magnesium: 2.1 mg/dL (ref 1.7–2.4)

## 2020-04-02 MED ORDER — HEPARIN SOD (PORK) LOCK FLUSH 100 UNIT/ML IV SOLN
500.0000 [IU] | Freq: Once | INTRAVENOUS | Status: AC
  Administered 2020-04-02: 13:00:00 500 [IU] via INTRAVENOUS

## 2020-04-02 MED ORDER — SODIUM CHLORIDE 0.9% FLUSH
10.0000 mL | Freq: Once | INTRAVENOUS | Status: AC
  Administered 2020-04-02: 10 mL via INTRAVENOUS

## 2020-04-02 MED ORDER — POTASSIUM CHLORIDE IN NACL 40-0.9 MEQ/L-% IV SOLN
Freq: Once | INTRAVENOUS | Status: AC
  Administered 2020-04-02: 11:00:00 125 mL/h via INTRAVENOUS
  Filled 2020-04-02: qty 1000

## 2020-04-02 MED ORDER — POTASSIUM CHLORIDE 10 MEQ/100ML IV SOLN: 10.0000 meq | INTRAVENOUS | Status: DC

## 2020-04-02 MED ORDER — POTASSIUM CHLORIDE CRYS ER 20 MEQ PO TBCR: 40.0000 meq | EXTENDED_RELEASE_TABLET | Freq: Once | ORAL | Status: DC

## 2020-04-02 MED ORDER — SCOPOLAMINE 1 MG/3DAYS TD PT72
1.0000 | MEDICATED_PATCH | Freq: Once | TRANSDERMAL | Status: DC
  Administered 2020-04-02: 1.5 mg via TRANSDERMAL
  Filled 2020-04-02: qty 1

## 2020-04-02 NOTE — Patient Instructions (Addendum)
[facility] at [facility] Discharge Instructions  You were seen today by Dr. Katragadda. He went over your recent lab results. He will see you back in 1 week for labs and follow up.   Thank you for choosing [facility] at [facility] to provide your oncology and hematology care.  To afford each patient quality time with our provider, please arrive at least 15 minutes before your scheduled appointment time.   If you have a lab appointment with the Cancer Center please come in thru the  Main Entrance and check in at the main information desk  You need to re-schedule your appointment should you arrive 10 or more minutes late.  We strive to give you quality time with our providers, and arriving late affects you and other patients whose appointments are after yours.  Also, if you no show three or more times for appointments you may be dismissed from the clinic at the providers discretion.     Again, thank you for choosing [facility].  Our hope is that these requests will decrease the amount of time that you wait before being seen by our physicians.       _____________________________________________________________  Should you have questions after your visit to [facility], please contact our office at [phone] between the hours of 8:00 a.m. and 4:30 p.m.  Voicemails left after 4:00 p.m. will not be returned until the following business day.  For prescription refill requests, have your pharmacy contact our office and allow 72 hours.    Cancer Center Support Programs:   > Cancer Support Group  2nd Tuesday of the month 1pm-2pm, Journey Room    

## 2020-04-02 NOTE — Progress Notes (Signed)
Patient has been assessed, vital signs and labs have been reviewed by Dr. Delton Coombes. HOLD treatment today but give 40 mEq IV potassium as well as a liter of NS over 2 hours, also apply a scope patch for nausea per Dr. Delton Coombes.

## 2020-04-02 NOTE — Patient Instructions (Signed)
[facility] at [facility]  Discharge Instructions:   _______________________________________________________________  Thank you for choosing [facility] at [facility] to provide your oncology and hematology care.  To afford each patient quality time with our providers, please arrive at least 15 minutes before your scheduled appointment.  You need to re-schedule your appointment if you arrive 10 or more minutes late.  We strive to give you quality time with our providers, and arriving late affects you and other patients whose appointments are after yours.  Also, if you no show three or more times for appointments you may be dismissed from the clinic.  Again, thank you for choosing [facility] at [facility]. Our hope is that these requests will allow you access to exceptional care and in a timely manner. _______________________________________________________________  If you have questions after your visit, please contact our office at [phone] between the hours of 8:30 a.m. and 5:00 p.m. Voicemails left after 4:30 p.m. will not be returned until the following business day. _______________________________________________________________  For prescription refill requests, have your pharmacy contact our office. _______________________________________________________________  Recommendations made by the consultant and any test results will be sent to your referring physician. _______________________________________________________________ 

## 2020-04-02 NOTE — Progress Notes (Signed)
Potassium is 2.7 today. Patient Nicole Ibarra unable to swallow pills today due to nausea and vomiting. Will give one liter normal saline with 40 meq of potassium today per MD.   No treatment today per MD. Madaline Brilliant to run iv saline with 40 of K+ over 2 hrs per MD.    Patient tolerated it well without problems. Vitals stable and discharged home from clinic via wheelchair. Follow up as scheduled.

## 2020-04-02 NOTE — Progress Notes (Signed)
Critical value alert:   Potassium 2.7  Dr. Delton Coombes and primary RN aware.  Provider will give orders to primary RN.

## 2020-04-02 NOTE — Progress Notes (Signed)
[place], [place] [address]   CLINIC:  Medical Oncology/Hematology  PCP:  Cory Munch, PA-C [place] [phone]   REASON FOR VISIT: Follow-up for cholangiocarcinoma to the liver.  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[place])   02/22/2019 - 10/19/2019 Chemotherapy   The patient had palonosetron (ALOXI) injection 0.25 mg, 0.25 mg, Intravenous,  Once, 10 of 10 cycles Administration: 0.25 mg (02/22/2019), 0.25 mg (03/01/2019), 0.25 mg (03/15/2019), 0.25 mg (03/22/2019), 0.25 mg (04/06/2019), 0.25 mg (04/13/2019), 0.25 mg (04/27/2019), 0.25 mg (05/04/2019), 0.25 mg (05/18/2019), 0.25 mg (05/25/2019), 0.25 mg (06/16/2019), 0.25 mg (06/22/2019), 0.25 mg (07/13/2019), 0.25 mg (07/20/2019), 0.25 mg (08/10/2019), 0.25 mg (08/24/2019), 0.25 mg (09/07/2019), 0.25 mg (09/14/2019) pegfilgrastim-cbqv (UDENYCA) injection 6 mg, 6 mg, Subcutaneous, Once, 10 of 10 cycles Administration: 6 mg (03/02/2019), 6 mg (03/24/2019), 6 mg (05/05/2019), 6 mg (05/26/2019), 6 mg (06/23/2019), 6 mg (07/21/2019), 6 mg (08/25/2019), 6 mg (09/15/2019) CISplatin (PLATINOL) 52 mg in sodium chloride 0.9 % 250 mL chemo infusion, 25 mg/m2 = 52 mg, Intravenous,  Once, 10 of 10 cycles Dose modification: 20 mg/m2 (80 % of original dose 25 mg/m2, Cycle 9, Reason: Other (see comments), Comment: renal insufficiency) Administration: 52 mg (02/22/2019), 52 mg (03/01/2019), 52 mg (03/15/2019), 52 mg (03/22/2019), 52 mg (04/06/2019), 52 mg (04/13/2019), 52 mg (04/27/2019), 52 mg (05/04/2019), 52 mg (05/18/2019), 52 mg (05/25/2019), 52 mg (06/16/2019), 52 mg (06/22/2019), 52 mg (07/13/2019), 52 mg (07/20/2019), 52 mg (08/10/2019), 52 mg (08/24/2019), 42 mg (09/07/2019), 42 mg (09/14/2019) gemcitabine (GEMZAR) 2,000 mg in sodium chloride 0.9 % 250 mL chemo  infusion, 2,090 mg, Intravenous,  Once, 10 of 10 cycles Dose modification: 750 mg/m2 (75 % of original dose 1,000 mg/m2, Cycle 4, Reason: Other (see comments), Comment: neutropenia), 750 mg/m2 (75 % of original dose 1,000 mg/m2, Cycle 7, Reason: Other (see comments), Comment: neutripenia) Administration: 2,000 mg (02/22/2019), 2,000 mg (03/01/2019), 2,000 mg (03/15/2019), 2,000 mg (03/22/2019), 2,000 mg (04/06/2019), 2,000 mg (04/13/2019), 2,000 mg (04/27/2019), 1,558 mg (05/04/2019), 2,000 mg (05/18/2019), 2,000 mg (05/25/2019), 2,000 mg (06/16/2019), 2,000 mg (06/22/2019), 2,000 mg (07/13/2019), 1,558 mg (07/20/2019), 2,000 mg (08/10/2019), 2,000 mg (08/24/2019), 2,000 mg (09/07/2019), 2,000 mg (09/14/2019) fosaprepitant (EMEND) 150 mg, dexamethasone (DECADRON) 12 mg in sodium chloride 0.9 % 145 mL IVPB, , Intravenous,  Once, 10 of 10 cycles Administration:  (02/22/2019),  (03/01/2019),  (03/15/2019),  (03/22/2019),  (04/06/2019),  (04/13/2019),  (04/27/2019),  (05/04/2019),  (05/18/2019),  (05/25/2019),  (06/16/2019),  (06/22/2019),  (07/13/2019),  (07/20/2019),  (08/10/2019),  (08/24/2019),  (09/07/2019),  (09/14/2019)  for chemotherapy treatment.    03/19/2020 -  Chemotherapy   The patient had palonosetron (ALOXI) injection 0.25 mg, 0.25 mg, Intravenous,  Once, 1 of 4 cycles Administration: 0.25 mg (03/19/2020) leucovorin 594 mg in dextrose 5 % 250 mL infusion, 300 mg/m2 = 594 mg (75 % of original dose 400 mg/m2), Intravenous,  Once, 1 of 4 cycles Dose modification: 300 mg/m2 (75 % of original dose 400 mg/m2, Cycle 1, Reason: Change in LFTs) Administration: 594 mg (03/19/2020) oxaliplatin (ELOXATIN) 125 mg in dextrose 5 % 500 mL chemo infusion, 63.75 mg/m2 = 125 mg (75 % of original dose 85 mg/m2), Intravenous,  Once, 1 of 4 cycles Dose modification: 63.75 mg/m2 (75 % of original dose 85 mg/m2, Cycle 1, Reason: Change in LFTs) Administration: 125  mg (03/19/2020) fluorouracil (ADRUCIL) chemo injection 600 mg, 300 mg/m2 = 600 mg (75 % of  original dose 400 mg/m2), Intravenous,  Once, 1 of 4 cycles Dose modification: 300 mg/m2 (75 % of original dose 400 mg/m2, Cycle 1, Reason: Change in LFTs) Administration: 600 mg (03/19/2020) fluorouracil (ADRUCIL) 3,550 mg in sodium chloride 0.9 % 79 mL chemo infusion, 1,800 mg/m2 = 3,550 mg (75 % of original dose 2,400 mg/m2), Intravenous, 1 Day/Dose, 1 of 4 cycles Dose modification: 1,800 mg/m2 (75 % of original dose 2,400 mg/m2, Cycle 1, Reason: Change in LFTs) Administration: 3,550 mg (03/19/2020)  for chemotherapy treatment.       INTERVAL HISTORY:  Ms. Herskovitz 64 y.o. female seen for follow-up of cholangiocarcinoma and toxicity assessment prior to cycle 2 of chemotherapy.  She vomited once this morning.  Denies any diarrhea.  She lost 4 pounds.  She is drinking clear boost 1 can/day.  Reports a lot of saliva secretion.  Pain is reasonably controlled with hydrocodone.  REVIEW OF SYSTEMS:  Review of Systems  Respiratory: Positive for shortness of breath.   Gastrointestinal: Positive for nausea and vomiting.  Neurological: Positive for numbness.  All other systems reviewed and are negative.    PAST MEDICAL/SURGICAL HISTORY:  Past Medical History:  Diagnosis Date  . Anxiety   . Arthritis   . Cancer ([place]) 01/2019   Metastatic cholangiocarcinoma  . Depression   . Hypertension    Past Surgical History:  Procedure Laterality Date  . BILIARY STENT PLACEMENT N/A 01/24/2020   Procedure: BILIARY STENT PLACEMENT;  Surgeon: Rogene Houston, MD;  [facility];  Service: Endoscopy;  Laterality: N/A;  . COLONOSCOPY N/A 10/23/2016   Procedure: COLONOSCOPY;  Surgeon: Danie Binder, MD;  [facility];  Service: Endoscopy;  Laterality: N/A;  11:30 Am  . ERCP N/A 01/24/2020   Procedure: ENDOSCOPIC RETROGRADE CHOLANGIOPANCREATOGRAPHY (ERCP);  Surgeon: Rogene Houston, MD;  Location[facility];  Service: Endoscopy;  Laterality: N/A;  . POLYPECTOMY  10/23/2016    Procedure: POLYPECTOMY;  Surgeon: Danie Binder, MD;  [facility];  Service: Endoscopy;;  sigmoid colon polyp  . PORTACATH PLACEMENT Left 02/16/2019   Procedure: INSERTION PORT-A-CATH (attached catheter in left subclavian);  Surgeon: Virl Cagey, MD;  Location: [facility] ORS;  Service: General;  Laterality: Left;  . RT HIP SURGERY    . SPHINCTEROTOMY N/A 01/24/2020   Procedure: SPHINCTEROTOMY;  Surgeon: Rogene Houston, MD;  [facility];  Service: Endoscopy;  Laterality: N/A;  . TOTAL HIP ARTHROPLASTY Left 04/07/2016   Procedure: LEFT TOTAL HIP ARTHROPLASTY ANTERIOR APPROACH;  Surgeon: Paralee Cancel, MD;  Location: [facility];  Service: Orthopedics;  Laterality: Left;  Unsuccessful for Spinal, went to General     SOCIAL HISTORY:  Social History   Socioeconomic History  . Marital status: Single    Spouse name: Not on file  . Number of children: Not on file  . Years of education: Not on file  . Highest education level: Not on file  Occupational History  . Not on file  Tobacco Use  . Smoking status: Current Every Day Smoker    Packs/day: 0.25    Years: 42.00    Pack years: 10.50  . Smokeless tobacco: Never Used  Substance and Sexual Activity  . Alcohol use: No  . Drug use: No  . Sexual activity: Not Currently  Other Topics Concern  . Not on file  Social History Narrative  .  Not on file   Social Determinants of Health   Financial Resource Strain:   . Difficulty of Paying Living Expenses:   Food Insecurity:   . Worried About Charity fundraiser in the Last Year:   . Arboriculturist in the Last Year:   Transportation Needs:   . Film/video editor (Medical):   Marland Kitchen Lack of Transportation (Non-Medical):   Physical Activity:   . Days of Exercise per Week:   . Minutes of Exercise per Session:   Stress:   . Feeling of Stress :   Social Connections:   . Frequency of Communication with Friends and Family:   . Frequency of Social Gatherings with Friends and  Family:   . Attends Religious Services:   . Active Member of Clubs or Organizations:   . Attends Archivist Meetings:   Marland Kitchen Marital Status:   Intimate Partner Violence:   . Fear of Current or Ex-Partner:   . Emotionally Abused:   Marland Kitchen Physically Abused:   . Sexually Abused:     FAMILY HISTORY:  Family History  Problem Relation Age of Onset  . Hypertension Mother   . Cancer Father   . Hypertension Brother   . Cancer Brother   . Hypertension Sister   . Cancer Sister     CURRENT MEDICATIONS:  Outpatient Encounter Medications as of 04/02/2020  Medication Sig  . amLODipine (NORVASC) 10 MG tablet TAKE 1 TABLET BY MOUTH EVERY DAY  . doxazosin (CARDURA) 2 MG tablet Take 2 mg by mouth at bedtime.   . pantoprazole (PROTONIX) 40 MG tablet Take 1 tablet (40 mg total) by mouth daily before breakfast.  . potassium chloride (KLOR-CON) 10 MEQ tablet TAKE 2 TABLETS (20 MEQ TOTAL) BY MOUTH 2 (TWO) TIMES DAILY. (Patient taking differently: Take 20 mEq by mouth 2 (two) times daily. )  . vitamin B-12 (CYANOCOBALAMIN) 1000 MCG tablet Take 1,000 mcg by mouth daily as needed (energy).  Marland Kitchen HYDROcodone-acetaminophen (NORCO) 7.5-325 MG tablet Take 1-2 tablets by mouth every 6 (six) hours as needed for moderate pain. (Patient not taking: Reported on 03/25/2020)  . lidocaine (XYLOCAINE) 2 % solution Swish and swallow 1 tablespoon four times a day prn sore mouth (Patient not taking: Reported on 03/25/2020)  . lidocaine-prilocaine (EMLA) cream Apply to skin over port a cath one hour prior to chemotherapy appointment (Patient not taking: Reported on 03/25/2020)  . ondansetron (ZOFRAN-ODT) 8 MG disintegrating tablet Take 1 tablet (8 mg total) by mouth every 8 (eight) hours as needed for nausea or vomiting. (Patient not taking: Reported on 04/02/2020)  . oxyCODONE (OXY IR/ROXICODONE) 5 MG immediate release tablet Take 1 tablet (5 mg total) by mouth every 4 (four) hours as needed for severe pain. (Patient not taking:  Reported on 03/25/2020)  . polyethylene glycol powder (GLYCOLAX/MIRALAX) 17 GM/SCOOP powder Take 17 g by mouth daily. (Patient not taking: Reported on 03/25/2020)  . prochlorperazine (COMPAZINE) 10 MG tablet TAKE 1 TABLET (10 MG TOTAL) BY MOUTH EVERY 6 (SIX) HOURS AS NEEDED (NAUSEA OR VOMITING). (Patient not taking: Reported on 03/25/2020)  . Simethicone (PHAZYME) 180 MG CAPS Take 1 capsule (180 mg total) by mouth 3 (three) times daily as needed. (Patient not taking: Reported on 03/25/2020)  . [DISCONTINUED] prochlorperazine (COMPAZINE) 10 MG tablet TAKE 1 TABLET (10 MG TOTAL) BY MOUTH EVERY 6 (SIX) HOURS AS NEEDED (NAUSEA OR VOMITING).   No facility-administered encounter medications on file as of 04/02/2020.    ALLERGIES:  No Known  Allergies   PHYSICAL EXAM:  ECOG Performance status: 1  Vitals:   04/02/20 0915  BP: 132/62  Pulse: 96  Resp: 18  Temp: (!) 96.9 F (36.1 C)  SpO2: 98%   Filed Weights   04/02/20 0915  Weight: 181 lb (82.1 kg)    Physical Exam Constitutional:      Appearance: Normal appearance. She is normal weight.  Cardiovascular:     Rate and Rhythm: Normal rate and regular rhythm.     Heart sounds: Normal heart sounds.  Pulmonary:     Effort: Pulmonary effort is normal.     Breath sounds: Normal breath sounds.  Abdominal:     General: Bowel sounds are normal.     Palpations: Abdomen is soft.  Musculoskeletal:        General: Normal range of motion.  Skin:    General: Skin is warm and dry.  Neurological:     Mental Status: She is alert and oriented to person, place, and time. Mental status is at baseline.  Psychiatric:        Mood and Affect: Mood normal.        Behavior: Behavior normal.        Thought Content: Thought content normal.        Judgment: Judgment normal.      LABORATORY DATA:  I have reviewed the labs as listed.  CBC    Component Value Date/Time   WBC 3.8 (L) 04/02/2020 0851   RBC 2.98 (L) 04/02/2020 0851   HGB 10.0 (L)  04/02/2020 0851   HCT 29.6 (L) 04/02/2020 0851   PLT 163 04/02/2020 0851   MCV 99.3 04/02/2020 0851   MCH 33.6 04/02/2020 0851   MCHC 33.8 04/02/2020 0851   RDW 14.5 04/02/2020 0851   LYMPHSABS 1.6 04/02/2020 0851   MONOABS 0.6 04/02/2020 0851   EOSABS 0.0 04/02/2020 0851   BASOSABS 0.0 04/02/2020 0851   CMP Latest Ref Rng & Units 04/02/2020 03/29/2020 03/25/2020  Glucose 70 - 99 mg/dL 138(H) 143(H) 123(H)  BUN 8 - 23 mg/dL 11 12 16   Creatinine 0.44 - 1.00 mg/dL 1.56(H) 1.65(H) 1.56(H)  Sodium 135 - 145 mmol/L 135 137 138  Potassium 3.5 - 5.1 mmol/L 2.7(LL) 3.0(L) 3.7  Chloride 98 - 111 mmol/L 98 98 98  CO2 22 - 32 mmol/L 20(L) 23 28  Calcium 8.9 - 10.3 mg/dL 9.1 9.0 9.3  Total Protein 6.5 - 8.1 g/dL 7.5 7.5 7.6  Total Bilirubin 0.3 - 1.2 mg/dL 3.5(H) 2.9(H) 2.1(H)  Alkaline Phos 38 - 126 U/L 419(H) 426(H) 437(H)  AST 15 - 41 U/L 79(H) 65(H) 47(H)  ALT 0 - 44 U/L 44 37 32    I have reviewed scans.   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  Small pulmonary nodule stable throughout hepatic adenopathy. -Guardant 360 did not show any targetable mutations.  Biopsy liver lesion could not be done due to technical difficulty. -Cycle 1 of FOLFOX with dose reduction on 03/19/2020. -She does not feel good today.  She lost 4 pounds.  She vomited once this morning. -She reports hypersalivation.  I will start her on scopolamine patch. -I have reviewed her labs.  Total bilirubin has gone up to 3.5 from 2.9.  White count is 3.8 with ANC of 1400. -I would hold off on her treatment today.  She will receive 1 L of fluids.  We will reevaluate her next week.  2.  Hyperbilirubinemia: -ERCP and plastic stent placement in the right hepatic biliary system on 01/24/2020. -Bilirubin has gone down up to 2.1 but has gone up again to 3.5  today.  If it keeps going up, will consider consult with GI.  3.  CKD: -This is from prior cisplatin chemotherapy.  Creatinine today is 1.56.  4.  Hypokalemia: -Potassium is 2.7.  She will receive 60 mEq of potassium in the office today. -She will continue potassium 20 mEq twice daily.  5.  Abdominal pain: -She will use hydrocodone 7.5 mg every 6 hours as needed.  6.  Weight loss: -She lost about 4 pounds since last visit.  She is drinking clear boost 1/day. -Weight loss likely from vomiting.  She will use Compazine on a regular basis.   Orders placed this encounter:  No orders of the defined types were placed in this encounter.     Derek Jack, MD [place] [phone]

## 2020-04-04 ENCOUNTER — Encounter (HOSPITAL_COMMUNITY): Payer: Medicare Other

## 2020-04-06 NOTE — Assessment & Plan Note (Signed)
1.  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  Small pulmonary nodule stable throughout hepatic adenopathy. -Guardant 360 did not show any targetable mutations.  Biopsy liver lesion could not be done due to technical difficulty. -Cycle 1 of FOLFOX with dose reduction on 03/19/2020. -She does not feel good today.  She lost 4 pounds.  She vomited once this morning. -She reports hypersalivation.  I will start her on scopolamine patch. -I have reviewed her labs.  Total bilirubin has gone up to 3.5 from 2.9.  White count is 3.8 with ANC of 1400. -I would hold off on her treatment today.  She will receive 1 L of fluids.  We will reevaluate her next week.  2.  Hyperbilirubinemia: -ERCP and plastic stent placement in the right hepatic biliary system on 01/24/2020. -Bilirubin has gone down up to 2.1 but has gone up again to 3.5 today.  If it keeps going up, will consider consult with GI.  3.  CKD: -This is from prior cisplatin chemotherapy.  Creatinine today is 1.56.  4.  Hypokalemia: -Potassium is 2.7.  She will receive 60 mEq of potassium in the office today. -She will continue potassium 20 mEq twice daily.  5.  Abdominal pain: -She will use hydrocodone 7.5 mg every 6 hours as needed.  6.  Weight loss: -She lost about 4 pounds since last visit.  She is drinking clear boost 1/day. -Weight loss likely from vomiting.  She will use Compazine on a regular basis.

## 2020-04-10 ENCOUNTER — Other Ambulatory Visit: Payer: Self-pay

## 2020-04-10 ENCOUNTER — Inpatient Hospital Stay (HOSPITAL_COMMUNITY): Payer: Medicare Other | Attending: Hematology

## 2020-04-10 ENCOUNTER — Encounter (HOSPITAL_COMMUNITY): Payer: Self-pay

## 2020-04-10 ENCOUNTER — Inpatient Hospital Stay (HOSPITAL_COMMUNITY): Payer: Medicare Other

## 2020-04-10 VITALS — BP 127/80 | HR 95 | Temp 97.0°F | Resp 16 | Wt 176.8 lb

## 2020-04-10 DIAGNOSIS — C221 Intrahepatic bile duct carcinoma: Secondary | ICD-10-CM | POA: Diagnosis not present

## 2020-04-10 DIAGNOSIS — Z79899 Other long term (current) drug therapy: Secondary | ICD-10-CM | POA: Diagnosis not present

## 2020-04-10 DIAGNOSIS — B370 Candidal stomatitis: Secondary | ICD-10-CM | POA: Insufficient documentation

## 2020-04-10 DIAGNOSIS — E876 Hypokalemia: Secondary | ICD-10-CM | POA: Insufficient documentation

## 2020-04-10 DIAGNOSIS — F17210 Nicotine dependence, cigarettes, uncomplicated: Secondary | ICD-10-CM | POA: Diagnosis not present

## 2020-04-10 DIAGNOSIS — R1110 Vomiting, unspecified: Secondary | ICD-10-CM | POA: Insufficient documentation

## 2020-04-10 DIAGNOSIS — R531 Weakness: Secondary | ICD-10-CM | POA: Diagnosis not present

## 2020-04-10 DIAGNOSIS — N189 Chronic kidney disease, unspecified: Secondary | ICD-10-CM | POA: Insufficient documentation

## 2020-04-10 DIAGNOSIS — Z8249 Family history of ischemic heart disease and other diseases of the circulatory system: Secondary | ICD-10-CM | POA: Insufficient documentation

## 2020-04-10 DIAGNOSIS — C787 Secondary malignant neoplasm of liver and intrahepatic bile duct: Secondary | ICD-10-CM | POA: Diagnosis not present

## 2020-04-10 DIAGNOSIS — Z809 Family history of malignant neoplasm, unspecified: Secondary | ICD-10-CM | POA: Insufficient documentation

## 2020-04-10 DIAGNOSIS — M1990 Unspecified osteoarthritis, unspecified site: Secondary | ICD-10-CM | POA: Diagnosis not present

## 2020-04-10 LAB — CBC WITH DIFFERENTIAL/PLATELET
Abs Immature Granulocytes: 0.19 10*3/uL — ABNORMAL HIGH (ref 0.00–0.07)
Basophils Absolute: 0 10*3/uL (ref 0.0–0.1)
Basophils Relative: 0 %
Eosinophils Absolute: 0.1 10*3/uL (ref 0.0–0.5)
Eosinophils Relative: 1 %
HCT: 29.1 % — ABNORMAL LOW (ref 36.0–46.0)
Hemoglobin: 9.7 g/dL — ABNORMAL LOW (ref 12.0–15.0)
Immature Granulocytes: 2 %
Lymphocytes Relative: 28 %
Lymphs Abs: 3.1 10*3/uL (ref 0.7–4.0)
MCH: 32.6 pg (ref 26.0–34.0)
MCHC: 33.3 g/dL (ref 30.0–36.0)
MCV: 97.7 fL (ref 80.0–100.0)
Monocytes Absolute: 1 10*3/uL (ref 0.1–1.0)
Monocytes Relative: 9 %
Neutro Abs: 6.7 10*3/uL (ref 1.7–7.7)
Neutrophils Relative %: 60 %
Platelets: 263 10*3/uL (ref 150–400)
RBC: 2.98 MIL/uL — ABNORMAL LOW (ref 3.87–5.11)
RDW: 16 % — ABNORMAL HIGH (ref 11.5–15.5)
WBC: 11.1 10*3/uL — ABNORMAL HIGH (ref 4.0–10.5)
nRBC: 0.4 % — ABNORMAL HIGH (ref 0.0–0.2)

## 2020-04-10 LAB — COMPREHENSIVE METABOLIC PANEL
ALT: 36 U/L (ref 0–44)
AST: 72 U/L — ABNORMAL HIGH (ref 15–41)
Albumin: 2.6 g/dL — ABNORMAL LOW (ref 3.5–5.0)
Alkaline Phosphatase: 472 U/L — ABNORMAL HIGH (ref 38–126)
Anion gap: 12 (ref 5–15)
BUN: 13 mg/dL (ref 8–23)
CO2: 20 mmol/L — ABNORMAL LOW (ref 22–32)
Calcium: 8.9 mg/dL (ref 8.9–10.3)
Chloride: 103 mmol/L (ref 98–111)
Creatinine, Ser: 1.88 mg/dL — ABNORMAL HIGH (ref 0.44–1.00)
GFR calc Af Amer: 32 mL/min — ABNORMAL LOW (ref >60)
GFR calc non Af Amer: 28 mL/min — ABNORMAL LOW (ref >60)
Glucose, Bld: 146 mg/dL — ABNORMAL HIGH (ref 70–99)
Potassium: 3 mmol/L — ABNORMAL LOW (ref 3.5–5.1)
Sodium: 135 mmol/L (ref 135–145)
Total Bilirubin: 2.9 mg/dL — ABNORMAL HIGH (ref 0.3–1.2)
Total Protein: 7.5 g/dL (ref 6.5–8.1)

## 2020-04-10 LAB — MAGNESIUM: Magnesium: 2.1 mg/dL (ref 1.7–2.4)

## 2020-04-10 MED ORDER — ONDANSETRON HCL 4 MG/2ML IJ SOLN
8.0000 mg | Freq: Once | INTRAMUSCULAR | Status: AC
  Administered 2020-04-10: 8 mg via INTRAVENOUS

## 2020-04-10 MED ORDER — POTASSIUM CHLORIDE CRYS ER 10 MEQ PO TBCR
20.0000 meq | EXTENDED_RELEASE_TABLET | Freq: Once | ORAL | Status: DC
  Filled 2020-04-10: qty 2

## 2020-04-10 MED ORDER — HEPARIN SOD (PORK) LOCK FLUSH 100 UNIT/ML IV SOLN
500.0000 [IU] | Freq: Once | INTRAVENOUS | Status: AC | PRN
  Administered 2020-04-10: 12:00:00 500 [IU]

## 2020-04-10 MED ORDER — SODIUM CHLORIDE 0.9 % IV SOLN
Freq: Once | INTRAVENOUS | Status: AC
  Filled 2020-04-10: qty 1000

## 2020-04-10 MED ORDER — ONDANSETRON HCL 4 MG/2ML IJ SOLN
INTRAMUSCULAR | Status: AC
  Filled 2020-04-10: qty 4

## 2020-04-10 MED ORDER — SODIUM CHLORIDE 0.9 % IV SOLN: INTRAVENOUS | Status: DC

## 2020-04-10 MED ORDER — SODIUM CHLORIDE 0.9% FLUSH
10.0000 mL | Freq: Once | INTRAVENOUS | Status: AC | PRN
  Administered 2020-04-10: 10 mL

## 2020-04-10 NOTE — Patient Instructions (Signed)
[facility] at [facility]  Discharge Instructions:   _______________________________________________________________  Thank you for choosing [facility] at [facility] to provide your oncology and hematology care.  To afford each patient quality time with our providers, please arrive at least 15 minutes before your scheduled appointment.  You need to re-schedule your appointment if you arrive 10 or more minutes late.  We strive to give you quality time with our providers, and arriving late affects you and other patients whose appointments are after yours.  Also, if you no show three or more times for appointments you may be dismissed from the clinic.  Again, thank you for choosing [facility] at [facility]. Our hope is that these requests will allow you access to exceptional care and in a timely manner. _______________________________________________________________  If you have questions after your visit, please contact our office at [phone] between the hours of 8:30 a.m. and 5:00 p.m. Voicemails left after 4:30 p.m. will not be returned until the following business day. _______________________________________________________________  For prescription refill requests, have your pharmacy contact our office. _______________________________________________________________  Recommendations made by the consultant and any test results will be sent to your referring physician. _______________________________________________________________ 

## 2020-04-10 NOTE — Progress Notes (Signed)
Patient presented to the clinic today with nausea and vomiting. Labs were drawn , NP notified. Will start hydration fluids and nausea medication per orders.   Labs reviewed by MD today. Will give one liter of hydration fluids with electrolytes and potassium per orders. Encouraged patient to keep nausea medication in her system, to take it as scheduled.   Patient tolerated it well without problems. Vitals stable and discharged home from clinic via wheelchair. Follow up as scheduled.

## 2020-04-10 NOTE — Patient Instructions (Signed)
[facility] at [facility] Discharge Instructions  Labs drawn from portacath today   Thank you for choosing [facility] at [facility] to provide your oncology and hematology care.  To afford each patient quality time with our provider, please arrive at least 15 minutes before your scheduled appointment time.   If you have a lab appointment with the Cancer Center please come in thru the Main Entrance and check in at the main information desk.  You need to re-schedule your appointment should you arrive 10 or more minutes late.  We strive to give you quality time with our providers, and arriving late affects you and other patients whose appointments are after yours.  Also, if you no show three or more times for appointments you may be dismissed from the clinic at the providers discretion.     Again, thank you for choosing [facility].  Our hope is that these requests will decrease the amount of time that you wait before being seen by our physicians.       _____________________________________________________________  Should you have questions after your visit to [facility], please contact our office at [phone] between the hours of 8:00 a.m. and 4:30 p.m.  Voicemails left after 4:00 p.m. will not be returned until the following business day.  For prescription refill requests, have your pharmacy contact our office and allow 72 hours.    Due to Covid, you will need to wear a mask upon entering the hospital. If you do not have a mask, a mask will be given to you at the Main Entrance upon arrival. For doctor visits, patients may have 1 support person with them. For treatment visits, patients can not have anyone with them due to social distancing guidelines and our immunocompromised population.     

## 2020-04-12 ENCOUNTER — Inpatient Hospital Stay (HOSPITAL_COMMUNITY): Payer: Medicare Other

## 2020-04-12 ENCOUNTER — Other Ambulatory Visit: Payer: Self-pay

## 2020-04-12 ENCOUNTER — Encounter (HOSPITAL_COMMUNITY): Payer: Self-pay

## 2020-04-12 VITALS — BP 161/77 | HR 79 | Temp 96.9°F | Resp 18

## 2020-04-12 DIAGNOSIS — C221 Intrahepatic bile duct carcinoma: Secondary | ICD-10-CM

## 2020-04-12 LAB — COMPREHENSIVE METABOLIC PANEL
ALT: 34 U/L (ref 0–44)
AST: 71 U/L — ABNORMAL HIGH (ref 15–41)
Albumin: 2.4 g/dL — ABNORMAL LOW (ref 3.5–5.0)
Alkaline Phosphatase: 491 U/L — ABNORMAL HIGH (ref 38–126)
Anion gap: 14 (ref 5–15)
BUN: 12 mg/dL (ref 8–23)
CO2: 23 mmol/L (ref 22–32)
Calcium: 8.9 mg/dL (ref 8.9–10.3)
Chloride: 101 mmol/L (ref 98–111)
Creatinine, Ser: 1.62 mg/dL — ABNORMAL HIGH (ref 0.44–1.00)
GFR calc Af Amer: 38 mL/min — ABNORMAL LOW (ref >60)
GFR calc non Af Amer: 33 mL/min — ABNORMAL LOW (ref >60)
Glucose, Bld: 128 mg/dL — ABNORMAL HIGH (ref 70–99)
Potassium: 3 mmol/L — ABNORMAL LOW (ref 3.5–5.1)
Sodium: 138 mmol/L (ref 135–145)
Total Bilirubin: 4.2 mg/dL — ABNORMAL HIGH (ref 0.3–1.2)
Total Protein: 7.1 g/dL (ref 6.5–8.1)

## 2020-04-12 LAB — MAGNESIUM: Magnesium: 2.1 mg/dL (ref 1.7–2.4)

## 2020-04-12 MED ORDER — SODIUM CHLORIDE 0.9 % IV SOLN
Freq: Once | INTRAVENOUS | Status: AC
  Filled 2020-04-12: qty 1000

## 2020-04-12 MED ORDER — SODIUM CHLORIDE 0.9% FLUSH
10.0000 mL | Freq: Once | INTRAVENOUS | Status: AC | PRN
  Administered 2020-04-12: 10 mL

## 2020-04-12 MED ORDER — HEPARIN SOD (PORK) LOCK FLUSH 100 UNIT/ML IV SOLN
500.0000 [IU] | Freq: Once | INTRAVENOUS | Status: AC | PRN
  Administered 2020-04-12: 500 [IU]

## 2020-04-12 NOTE — Progress Notes (Signed)
Patient presents today for house fluids and repeat cmp. Vital signs are stable. Patient denies any pain today. Patient states she took her nausea pill prior to her appointment today. Patient states she has been able to drink fluids but unable to eat. Labs pending.   Potassium 3.0 today. Reviewed with RLockamy NP. No new orders. Patient instructed to take Potassium as ordered.   Sodium Chloride 0.9% 1,034ml with Potassium Chlogiven today per MD orders. Tolerated infusion without adverse affects. Vital signs stable. No complaints at this time. Discharged from clinic via wheel chair. F/U with [facility] as scheduled.

## 2020-04-12 NOTE — Patient Instructions (Signed)
[facility] at [facility]  Discharge Instructions:   _______________________________________________________________  Thank you for choosing [facility] at [facility] to provide your oncology and hematology care.  To afford each patient quality time with our providers, please arrive at least 15 minutes before your scheduled appointment.  You need to re-schedule your appointment if you arrive 10 or more minutes late.  We strive to give you quality time with our providers, and arriving late affects you and other patients whose appointments are after yours.  Also, if you no show three or more times for appointments you may be dismissed from the clinic.  Again, thank you for choosing  [facility] at [facility]. Our hope is that these requests will allow you access to exceptional care and in a timely manner. _______________________________________________________________  If you have questions after your visit, please contact our office at [phone] between the hours of 8:30 a.m. and 5:00 p.m. Voicemails left after 4:30 p.m. will not be returned until the following business day. _______________________________________________________________  For prescription refill requests, have your pharmacy contact our office. _______________________________________________________________  Recommendations made by the consultant and any test results will be sent to your referring physician. _______________________________________________________________ 

## 2020-04-15 ENCOUNTER — Inpatient Hospital Stay (HOSPITAL_COMMUNITY): Payer: Medicare Other

## 2020-04-15 ENCOUNTER — Inpatient Hospital Stay (HOSPITAL_BASED_OUTPATIENT_CLINIC_OR_DEPARTMENT_OTHER): Payer: Medicare Other | Admitting: Hematology

## 2020-04-15 ENCOUNTER — Other Ambulatory Visit: Payer: Self-pay

## 2020-04-15 ENCOUNTER — Encounter (HOSPITAL_COMMUNITY): Payer: Self-pay | Admitting: Hematology

## 2020-04-15 VITALS — BP 114/74 | HR 116 | Temp 96.9°F | Resp 18 | Wt 175.1 lb

## 2020-04-15 DIAGNOSIS — C221 Intrahepatic bile duct carcinoma: Secondary | ICD-10-CM | POA: Diagnosis not present

## 2020-04-15 DIAGNOSIS — R17 Unspecified jaundice: Secondary | ICD-10-CM

## 2020-04-15 DIAGNOSIS — C787 Secondary malignant neoplasm of liver and intrahepatic bile duct: Secondary | ICD-10-CM

## 2020-04-15 DIAGNOSIS — E876 Hypokalemia: Secondary | ICD-10-CM

## 2020-04-15 LAB — COMPREHENSIVE METABOLIC PANEL
ALT: 35 U/L (ref 0–44)
AST: 61 U/L — ABNORMAL HIGH (ref 15–41)
Albumin: 2.4 g/dL — ABNORMAL LOW (ref 3.5–5.0)
Alkaline Phosphatase: 464 U/L — ABNORMAL HIGH (ref 38–126)
Anion gap: 15 (ref 5–15)
BUN: 17 mg/dL (ref 8–23)
CO2: 20 mmol/L — ABNORMAL LOW (ref 22–32)
Calcium: 8.8 mg/dL — ABNORMAL LOW (ref 8.9–10.3)
Chloride: 103 mmol/L (ref 98–111)
Creatinine, Ser: 2.01 mg/dL — ABNORMAL HIGH (ref 0.44–1.00)
GFR calc Af Amer: 30 mL/min — ABNORMAL LOW (ref >60)
GFR calc non Af Amer: 26 mL/min — ABNORMAL LOW (ref >60)
Glucose, Bld: 161 mg/dL — ABNORMAL HIGH (ref 70–99)
Potassium: 3.2 mmol/L — ABNORMAL LOW (ref 3.5–5.1)
Sodium: 138 mmol/L (ref 135–145)
Total Bilirubin: 5.7 mg/dL — ABNORMAL HIGH (ref 0.3–1.2)
Total Protein: 7 g/dL (ref 6.5–8.1)

## 2020-04-15 LAB — CBC WITH DIFFERENTIAL/PLATELET
Abs Immature Granulocytes: 0.17 10*3/uL — ABNORMAL HIGH (ref 0.00–0.07)
Basophils Absolute: 0 10*3/uL (ref 0.0–0.1)
Basophils Relative: 0 %
Eosinophils Absolute: 0.1 10*3/uL (ref 0.0–0.5)
Eosinophils Relative: 0 %
HCT: 26.3 % — ABNORMAL LOW (ref 36.0–46.0)
Hemoglobin: 8.6 g/dL — ABNORMAL LOW (ref 12.0–15.0)
Immature Granulocytes: 1 %
Lymphocytes Relative: 12 %
Lymphs Abs: 2.2 10*3/uL (ref 0.7–4.0)
MCH: 32 pg (ref 26.0–34.0)
MCHC: 32.7 g/dL (ref 30.0–36.0)
MCV: 97.8 fL (ref 80.0–100.0)
Monocytes Absolute: 0.9 10*3/uL (ref 0.1–1.0)
Monocytes Relative: 5 %
Neutro Abs: 15.5 10*3/uL — ABNORMAL HIGH (ref 1.7–7.7)
Neutrophils Relative %: 82 %
Platelets: 194 10*3/uL (ref 150–400)
RBC: 2.69 MIL/uL — ABNORMAL LOW (ref 3.87–5.11)
RDW: 18.6 % — ABNORMAL HIGH (ref 11.5–15.5)
WBC: 18.8 10*3/uL — ABNORMAL HIGH (ref 4.0–10.5)
nRBC: 0.5 % — ABNORMAL HIGH (ref 0.0–0.2)

## 2020-04-15 LAB — MAGNESIUM: Magnesium: 2.2 mg/dL (ref 1.7–2.4)

## 2020-04-15 LAB — BILIRUBIN, DIRECT: Bilirubin, Direct: 3 mg/dL — ABNORMAL HIGH (ref 0.0–0.2)

## 2020-04-15 MED ORDER — DRONABINOL 5 MG PO CAPS: 5.0000 mg | ORAL_CAPSULE | Freq: Two times a day (BID) | ORAL | 0 refills | Status: AC

## 2020-04-15 MED ORDER — FLUCONAZOLE 100 MG PO TABS: 100.0000 mg | ORAL_TABLET | Freq: Every day | ORAL | 0 refills | Status: AC

## 2020-04-15 MED ORDER — SODIUM CHLORIDE 0.9% FLUSH
10.0000 mL | Freq: Once | INTRAVENOUS | Status: AC | PRN
  Administered 2020-04-15: 10 mL

## 2020-04-15 MED ORDER — HEPARIN SOD (PORK) LOCK FLUSH 100 UNIT/ML IV SOLN
500.0000 [IU] | Freq: Once | INTRAVENOUS | Status: AC | PRN
  Administered 2020-04-15: 500 [IU]

## 2020-04-15 MED ORDER — ONDANSETRON HCL 4 MG/2ML IJ SOLN
8.0000 mg | Freq: Once | INTRAMUSCULAR | Status: AC
  Administered 2020-04-15: 8 mg via INTRAVENOUS
  Filled 2020-04-15: qty 4

## 2020-04-15 MED ORDER — POTASSIUM CHLORIDE IN NACL 20-0.9 MEQ/L-% IV SOLN
Freq: Once | INTRAVENOUS | Status: AC
  Filled 2020-04-15: qty 1000

## 2020-04-15 NOTE — Patient Instructions (Addendum)
[place] at [facility] Discharge Instructions  You were seen today by Dr. Delton Coombes. He went over your recent lab results. He discussed stopping treatments due to your weakness and how you're feeling. Start taking the nausea medication first thing in the morning and throughout the day to help with nausea. He will see you back in  for labs and follow up.   Thank you for choosing [place] at [facility] to provide your oncology and hematology care.  To afford each patient quality time with our provider, please arrive at least 15 minutes before your scheduled appointment time.   If you have a lab appointment with the [place] please come in thru the  Main Entrance and check in at the main information desk  You need to re-schedule your appointment should you arrive 10 or more minutes late.  We strive to give you quality time with our providers, and arriving late affects you and other patients whose appointments are after yours.  Also, if you no show three or more times for appointments you may be dismissed from the clinic at the providers discretion.     Again, thank you for choosing [facility].  Our hope is that these requests will decrease the amount of time that you wait before being seen by our physicians.       _____________________________________________________________  Should you have questions after your visit to [facility], please contact our office at [phone] between the hours of 8:00 a.m. and 4:30 p.m.  Voicemails left after 4:00 p.m. will not be returned until the following business day.  For prescription refill requests, have your pharmacy contact our office and allow 72 hours.    Cancer Center Support Programs:   > Cancer Support Group  2nd Tuesday of the month 1pm-2pm, Journey Room

## 2020-04-15 NOTE — Patient Instructions (Signed)
[facility] at [facility] Discharge Instructions  Labs drawn from portacath today   Thank you for choosing [facility] at [facility] to provide your oncology and hematology care.  To afford each patient quality time with our provider, please arrive at least 15 minutes before your scheduled appointment time.   If you have a lab appointment with the Cancer Center please come in thru the Main Entrance and check in at the main information desk.  You need to re-schedule your appointment should you arrive 10 or more minutes late.  We strive to give you quality time with our providers, and arriving late affects you and other patients whose appointments are after yours.  Also, if you no show three or more times for appointments you may be dismissed from the clinic at the providers discretion.     Again, thank you for choosing [facility].  Our hope is that these requests will decrease the amount of time that you wait before being seen by our physicians.       _____________________________________________________________  Should you have questions after your visit to [facility], please contact our office at [phone] between the hours of 8:00 a.m. and 4:30 p.m.  Voicemails left after 4:00 p.m. will not be returned until the following business day.  For prescription refill requests, have your pharmacy contact our office and allow 72 hours.    Due to Covid, you will need to wear a mask upon entering the hospital. If you do not have a mask, a mask will be given to you at the Main Entrance upon arrival. For doctor visits, patients may have 1 support person with them. For treatment visits, patients can not have anyone with them due to social distancing guidelines and our immunocompromised population.     

## 2020-04-15 NOTE — Progress Notes (Signed)
Patient has been assessed, vital signs and labs have been reviewed by Dr. Delton Coombes.  HOLD treatment today, please give 1 liter NS with 20 mEq potassium over 2 hours per Dr. Delton Coombes.

## 2020-04-15 NOTE — Progress Notes (Signed)
[place], [place] [address]   CLINIC:  Medical Oncology/Hematology  PCP:  Cory Munch, PA-C [place] [phone]   REASON FOR VISIT: Follow-up for cholangiocarcinoma to the liver.  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[place])   02/22/2019 - 10/19/2019 Chemotherapy   The patient had palonosetron (ALOXI) injection 0.25 mg, 0.25 mg, Intravenous,  Once, 10 of 10 cycles Administration: 0.25 mg (02/22/2019), 0.25 mg (03/01/2019), 0.25 mg (03/15/2019), 0.25 mg (03/22/2019), 0.25 mg (04/06/2019), 0.25 mg (04/13/2019), 0.25 mg (04/27/2019), 0.25 mg (05/04/2019), 0.25 mg (05/18/2019), 0.25 mg (05/25/2019), 0.25 mg (06/16/2019), 0.25 mg (06/22/2019), 0.25 mg (07/13/2019), 0.25 mg (07/20/2019), 0.25 mg (08/10/2019), 0.25 mg (08/24/2019), 0.25 mg (09/07/2019), 0.25 mg (09/14/2019) pegfilgrastim-cbqv (UDENYCA) injection 6 mg, 6 mg, Subcutaneous, Once, 10 of 10 cycles Administration: 6 mg (03/02/2019), 6 mg (03/24/2019), 6 mg (05/05/2019), 6 mg (05/26/2019), 6 mg (06/23/2019), 6 mg (07/21/2019), 6 mg (08/25/2019), 6 mg (09/15/2019) CISplatin (PLATINOL) 52 mg in sodium chloride 0.9 % 250 mL chemo infusion, 25 mg/m2 = 52 mg, Intravenous,  Once, 10 of 10 cycles Dose modification: 20 mg/m2 (80 % of original dose 25 mg/m2, Cycle 9, Reason: Other (see comments), Comment: renal insufficiency) Administration: 52 mg (02/22/2019), 52 mg (03/01/2019), 52 mg (03/15/2019), 52 mg (03/22/2019), 52 mg (04/06/2019), 52 mg (04/13/2019), 52 mg (04/27/2019), 52 mg (05/04/2019), 52 mg (05/18/2019), 52 mg (05/25/2019), 52 mg (06/16/2019), 52 mg (06/22/2019), 52 mg (07/13/2019), 52 mg (07/20/2019), 52 mg (08/10/2019), 52 mg (08/24/2019), 42 mg (09/07/2019), 42 mg (09/14/2019) gemcitabine (GEMZAR) 2,000 mg in sodium chloride 0.9 % 250 mL chemo  infusion, 2,090 mg, Intravenous,  Once, 10 of 10 cycles Dose modification: 750 mg/m2 (75 % of original dose 1,000 mg/m2, Cycle 4, Reason: Other (see comments), Comment: neutropenia), 750 mg/m2 (75 % of original dose 1,000 mg/m2, Cycle 7, Reason: Other (see comments), Comment: neutripenia) Administration: 2,000 mg (02/22/2019), 2,000 mg (03/01/2019), 2,000 mg (03/15/2019), 2,000 mg (03/22/2019), 2,000 mg (04/06/2019), 2,000 mg (04/13/2019), 2,000 mg (04/27/2019), 1,558 mg (05/04/2019), 2,000 mg (05/18/2019), 2,000 mg (05/25/2019), 2,000 mg (06/16/2019), 2,000 mg (06/22/2019), 2,000 mg (07/13/2019), 1,558 mg (07/20/2019), 2,000 mg (08/10/2019), 2,000 mg (08/24/2019), 2,000 mg (09/07/2019), 2,000 mg (09/14/2019) fosaprepitant (EMEND) 150 mg, dexamethasone (DECADRON) 12 mg in sodium chloride 0.9 % 145 mL IVPB, , Intravenous,  Once, 10 of 10 cycles Administration:  (02/22/2019),  (03/01/2019),  (03/15/2019),  (03/22/2019),  (04/06/2019),  (04/13/2019),  (04/27/2019),  (05/04/2019),  (05/18/2019),  (05/25/2019),  (06/16/2019),  (06/22/2019),  (07/13/2019),  (07/20/2019),  (08/10/2019),  (08/24/2019),  (09/07/2019),  (09/14/2019)  for chemotherapy treatment.    03/19/2020 -  Chemotherapy   The patient had palonosetron (ALOXI) injection 0.25 mg, 0.25 mg, Intravenous,  Once, 1 of 4 cycles Administration: 0.25 mg (03/19/2020) leucovorin 594 mg in dextrose 5 % 250 mL infusion, 300 mg/m2 = 594 mg (75 % of original dose 400 mg/m2), Intravenous,  Once, 1 of 4 cycles Dose modification: 300 mg/m2 (75 % of original dose 400 mg/m2, Cycle 1, Reason: Change in LFTs) Administration: 594 mg (03/19/2020) oxaliplatin (ELOXATIN) 125 mg in dextrose 5 % 500 mL chemo infusion, 63.75 mg/m2 = 125 mg (75 % of original dose 85 mg/m2), Intravenous,  Once, 1 of 4 cycles Dose modification: 63.75 mg/m2 (75 % of original dose 85 mg/m2, Cycle 1, Reason: Change in LFTs) Administration: 125  mg (03/19/2020) fluorouracil (ADRUCIL) chemo injection 600 mg, 300 mg/m2 = 600 mg (75 % of  original dose 400 mg/m2), Intravenous,  Once, 1 of 4 cycles Dose modification: 300 mg/m2 (75 % of original dose 400 mg/m2, Cycle 1, Reason: Change in LFTs) Administration: 600 mg (03/19/2020) fluorouracil (ADRUCIL) 3,550 mg in sodium chloride 0.9 % 79 mL chemo infusion, 1,800 mg/m2 = 3,550 mg (75 % of original dose 2,400 mg/m2), Intravenous, 1 Day/Dose, 1 of 4 cycles Dose modification: 1,800 mg/m2 (75 % of original dose 2,400 mg/m2, Cycle 1, Reason: Change in LFTs) Administration: 3,550 mg (03/19/2020)  for chemotherapy treatment.       INTERVAL HISTORY:  Ms. Wammack 64 y.o. female seen for follow-up of cholangiocarcinoma and toxicity assessment prior to cycle 2 of chemotherapy.  Reports very weak and no appetite.  She is also vomiting after eating.  Denies any tingling or numbness in the extremities.  Scopolamine patch did not work.  She is taking Zofran sublingual and Compazine as needed.  She is able to eat Jell-O and some potatoes.  REVIEW OF SYSTEMS:  Review of Systems  Respiratory: Positive for shortness of breath.   Gastrointestinal: Positive for constipation, nausea and vomiting.  Psychiatric/Behavioral: Positive for sleep disturbance.  All other systems reviewed and are negative.    PAST MEDICAL/SURGICAL HISTORY:  Past Medical History:  Diagnosis Date  . Anxiety   . Arthritis   . Cancer ([place]) 01/2019   Metastatic cholangiocarcinoma  . Depression   . Hypertension    Past Surgical History:  Procedure Laterality Date  . BILIARY STENT PLACEMENT N/A 01/24/2020   Procedure: BILIARY STENT PLACEMENT;  Surgeon: Rogene Houston, MD;  [facility];  Service: Endoscopy;  Laterality: N/A;  . COLONOSCOPY N/A 10/23/2016   Procedure: COLONOSCOPY;  Surgeon: Danie Binder, MD;  [facility]: [facility];  Service: Endoscopy;  Laterality: N/A;  11:30 Am  . ERCP N/A 01/24/2020   Procedure: ENDOSCOPIC RETROGRADE CHOLANGIOPANCREATOGRAPHY (ERCP);  Surgeon: Rogene Houston, MD;   Location: [facility];  Service: Endoscopy;  Laterality: N/A;  . POLYPECTOMY  10/23/2016   Procedure: POLYPECTOMY;  Surgeon: Danie Binder, MD;  [facility];  Service: Endoscopy;;  sigmoid colon polyp  . PORTACATH PLACEMENT Left 02/16/2019   Procedure: INSERTION PORT-A-CATH (attached catheter in left subclavian);  Surgeon: Virl Cagey, MD;  Location: [facility] ORS;  Service: General;  Laterality: Left;  . RT HIP SURGERY    . SPHINCTEROTOMY N/A 01/24/2020   Procedure: SPHINCTEROTOMY;  Surgeon: Rogene Houston, MD;  Location: [facility];  Service: Endoscopy;  Laterality: N/A;  . TOTAL HIP ARTHROPLASTY Left 04/07/2016   Procedure: LEFT TOTAL HIP ARTHROPLASTY ANTERIOR APPROACH;  Surgeon: Paralee Cancel, MD;  Location: [facility];  Service: Orthopedics;  Laterality: Left;  Unsuccessful for Spinal, went to General     SOCIAL HISTORY:  Social History   Socioeconomic History  . Marital status: Single    Spouse name: Not on file  . Number of children: Not on file  . Years of education: Not on file  . Highest education level: Not on file  Occupational History  . Not on file  Tobacco Use  . Smoking status: Current Every Day Smoker    Packs/day: 0.25    Years: 42.00    Pack years: 10.50  . Smokeless tobacco: Never Used  Substance and Sexual Activity  . Alcohol use: No  . Drug use: No  . Sexual activity: Not Currently  Other Topics Concern  . Not on file  Social History Narrative  . Not on file   Social Determinants of Health   Financial Resource Strain:   . Difficulty of Paying Living Expenses:   Food Insecurity:   . Worried About Charity fundraiser in the Last Year:   . Arboriculturist in the Last Year:   Transportation Needs:   . Film/video editor (Medical):   Marland Kitchen Lack of Transportation (Non-Medical):   Physical Activity:   . Days of Exercise per Week:   . Minutes of Exercise per Session:   Stress:   . Feeling of Stress :   Social Connections:   . Frequency  of Communication with Friends and Family:   . Frequency of Social Gatherings with Friends and Family:   . Attends Religious Services:   . Active Member of Clubs or Organizations:   . Attends Archivist Meetings:   Marland Kitchen Marital Status:   Intimate Partner Violence:   . Fear of Current or Ex-Partner:   . Emotionally Abused:   Marland Kitchen Physically Abused:   . Sexually Abused:     FAMILY HISTORY:  Family History  Problem Relation Age of Onset  . Hypertension Mother   . Cancer Father   . Hypertension Brother   . Cancer Brother   . Hypertension Sister   . Cancer Sister     CURRENT MEDICATIONS:  Outpatient Encounter Medications as of 04/15/2020  Medication Sig  . amLODipine (NORVASC) 10 MG tablet TAKE 1 TABLET BY MOUTH EVERY DAY  . doxazosin (CARDURA) 2 MG tablet Take 2 mg by mouth at bedtime.   . pantoprazole (PROTONIX) 40 MG tablet Take 1 tablet (40 mg total) by mouth daily before breakfast.  . potassium chloride (KLOR-CON) 10 MEQ tablet TAKE 2 TABLETS (20 MEQ TOTAL) BY MOUTH 2 (TWO) TIMES DAILY. (Patient taking differently: Take 20 mEq by mouth 2 (two) times daily. )  . vitamin B-12 (CYANOCOBALAMIN) 1000 MCG tablet Take 1,000 mcg by mouth daily as needed (energy).  Marland Kitchen dronabinol (MARINOL) 5 MG capsule Take 1 capsule (5 mg total) by mouth 2 (two) times daily before a meal.  . fluconazole (DIFLUCAN) 100 MG tablet Take 1 tablet (100 mg total) by mouth daily. Take 2 tablets today and then take 1 tablet daily for 6 days  . HYDROcodone-acetaminophen (NORCO) 7.5-325 MG tablet Take 1-2 tablets by mouth every 6 (six) hours as needed for moderate pain. (Patient not taking: Reported on 03/25/2020)  . lidocaine (XYLOCAINE) 2 % solution Swish and swallow 1 tablespoon four times a day prn sore mouth (Patient not taking: Reported on 03/25/2020)  . lidocaine-prilocaine (EMLA) cream Apply to skin over port a cath one hour prior to chemotherapy appointment (Patient not taking: Reported on 03/25/2020)  .  ondansetron (ZOFRAN-ODT) 8 MG disintegrating tablet Take 1 tablet (8 mg total) by mouth every 8 (eight) hours as needed for nausea or vomiting. (Patient not taking: Reported on 04/02/2020)  . oxyCODONE (OXY IR/ROXICODONE) 5 MG immediate release tablet Take 1 tablet (5 mg total) by mouth every 4 (four) hours as needed for severe pain. (Patient not taking: Reported on 03/25/2020)  . polyethylene glycol powder (GLYCOLAX/MIRALAX) 17 GM/SCOOP powder Take 17 g by mouth daily. (Patient not taking: Reported on 03/25/2020)  . prochlorperazine (COMPAZINE) 10 MG tablet TAKE 1 TABLET (10 MG TOTAL) BY MOUTH EVERY 6 (SIX) HOURS AS NEEDED (NAUSEA OR VOMITING). (Patient not taking: Reported on 03/25/2020)  . Simethicone (PHAZYME) 180  MG CAPS Take 1 capsule (180 mg total) by mouth 3 (three) times daily as needed. (Patient not taking: Reported on 03/25/2020)  . [DISCONTINUED] prochlorperazine (COMPAZINE) 10 MG tablet TAKE 1 TABLET (10 MG TOTAL) BY MOUTH EVERY 6 (SIX) HOURS AS NEEDED (NAUSEA OR VOMITING).   No facility-administered encounter medications on file as of 04/15/2020.    ALLERGIES:  No Known Allergies   PHYSICAL EXAM:  ECOG Performance status: 1  Vitals:   04/15/20 0805  BP: 114/74  Pulse: (!) 116  Resp: 18  Temp: (!) 96.9 F (36.1 C)  SpO2: 100%   Filed Weights   04/15/20 0805  Weight: 175 lb 1.6 oz (79.4 kg)    Physical Exam Constitutional:      Appearance: Normal appearance. She is normal weight.  Cardiovascular:     Rate and Rhythm: Normal rate and regular rhythm.     Heart sounds: Normal heart sounds.  Pulmonary:     Effort: Pulmonary effort is normal.     Breath sounds: Normal breath sounds.  Abdominal:     General: Bowel sounds are normal.     Palpations: Abdomen is soft.  Musculoskeletal:        General: Normal range of motion.  Skin:    General: Skin is warm and dry.  Neurological:     Mental Status: She is alert and oriented to person, place, and time. Mental status is at  baseline.  Psychiatric:        Mood and Affect: Mood normal.        Behavior: Behavior normal.        Thought Content: Thought content normal.        Judgment: Judgment normal.      LABORATORY DATA:  I have reviewed the labs as listed.  CBC    Component Value Date/Time   WBC 18.8 (H) 04/15/2020 0859   RBC 2.69 (L) 04/15/2020 0859   HGB 8.6 (L) 04/15/2020 0859   HCT 26.3 (L) 04/15/2020 0859   PLT 194 04/15/2020 0859   MCV 97.8 04/15/2020 0859   MCH 32.0 04/15/2020 0859   MCHC 32.7 04/15/2020 0859   RDW 18.6 (H) 04/15/2020 0859   LYMPHSABS 2.2 04/15/2020 0859   MONOABS 0.9 04/15/2020 0859   EOSABS 0.1 04/15/2020 0859   BASOSABS 0.0 04/15/2020 0859   CMP Latest Ref Rng & Units 04/15/2020 04/12/2020 04/10/2020  Glucose 70 - 99 mg/dL 161(H) 128(H) 146(H)  BUN 8 - 23 mg/dL 17 12 13   Creatinine 0.44 - 1.00 mg/dL 2.01(H) 1.62(H) 1.88(H)  Sodium 135 - 145 mmol/L 138 138 135  Potassium 3.5 - 5.1 mmol/L 3.2(L) 3.0(L) 3.0(L)  Chloride 98 - 111 mmol/L 103 101 103  CO2 22 - 32 mmol/L 20(L) 23 20(L)  Calcium 8.9 - 10.3 mg/dL 8.8(L) 8.9 8.9  Total Protein 6.5 - 8.1 g/dL 7.0 7.1 7.5  Total Bilirubin 0.3 - 1.2 mg/dL 5.7(H) 4.2(H) 2.9(H)  Alkaline Phos 38 - 126 U/L 464(H) 491(H) 472(H)  AST 15 - 41 U/L 61(H) 71(H) 72(H)  ALT 0 - 44 U/L 35 34 36    I have reviewed scans.   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  Small pulmonary nodules.  Stable hepatic adenopathy. -Guardant 360 did not show any targetable mutations.  Biopsy of the liver lesion could not be done due to  technical difficulty. -Cycle 1 of FOLFOX with dose reduction on 03/19/2020. -She feels very weak with no appetite.  She is also vomiting a lot. -She lost 10 pounds in the last 1 month.  I would hold her treatment today.  We will give her some IV  fluids. -She has oral thrush.  We will start her on Diflucan.  2.  Hyperbilirubinemia: -ERCP and plastic stent placement in the right hepatic biliary system on 01/24/2020. -Bilirubin has gone up to 5 today.  I have called and talked to Dr. Laural Golden who has kindly agreed to do ERCP and put a metal stent.  3.  CKD: -Creatinine has gone up to 2.01 today.  Likely from dehydration.  We will give her 1 L of normal saline.  4.  Hypokalemia: -Potassium is 3.2.  She will receive 20 mEq potassium today.  She will continue home potassium 20 mEq twice daily.  5.  Weight loss: -He lost 10 pounds in the last 4 weeks. -She does not have any appetite.  We will start her on Marinol 5 mg twice daily.  6.  Abdominal pain: -She is taking hydrocodone 7.5 mg as needed.   Orders placed this encounter:  Orders Placed This Encounter  Procedures  . Bilirubin, direct      Derek Jack, MD [place] [phone]

## 2020-04-15 NOTE — Patient Instructions (Signed)
[facility] at [facility]  Discharge Instructions:   _______________________________________________________________  Thank you for choosing [facility] at [facility] to provide your oncology and hematology care.  To afford each patient quality time with our providers, please arrive at least 15 minutes before your scheduled appointment.  You need to re-schedule your appointment if you arrive 10 or more minutes late.  We strive to give you quality time with our providers, and arriving late affects you and other patients whose appointments are after yours.  Also, if you no show three or more times for appointments you may be dismissed from the clinic.  Again, thank you for choosing [facility] at [facility]. Our hope is that these requests will allow you access to exceptional care and in a timely manner. _______________________________________________________________  If you have questions after your visit, please contact our office at [phone] between the hours of 8:30 a.m. and 5:00 p.m. Voicemails left after 4:30 p.m. will not be returned until the following business day. _______________________________________________________________  For prescription refill requests, have your pharmacy contact our office. _______________________________________________________________  Recommendations made by the consultant and any test results will be sent to your referring physician. _______________________________________________________________ 

## 2020-04-15 NOTE — Assessment & Plan Note (Signed)
1.  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  Small pulmonary nodules.  Stable hepatic adenopathy. -Guardant 360 did not show any targetable mutations.  Biopsy of the liver lesion could not be done due to technical difficulty. -Cycle 1 of FOLFOX with dose reduction on 03/19/2020. -She feels very weak with no appetite.  She is also vomiting a lot. -She lost 10 pounds in the last 1 month.  I would hold her treatment today.  We will give her some IV fluids. -She has oral thrush.  We will start her on Diflucan.  2.  Hyperbilirubinemia: -ERCP and plastic stent placement in the right hepatic biliary system on 01/24/2020. -Bilirubin has gone up to 5 today.  I have called and talked to Dr. Laural Golden who has kindly agreed to do ERCP and put a metal stent.  3.  CKD: -Creatinine has gone up to 2.01 today.  Likely from dehydration.  We will give her 1 L of normal saline.  4.  Hypokalemia: -Potassium is 3.2.  She will receive 20 mEq potassium today.  She will continue home potassium 20 mEq twice daily.  5.  Weight loss: -He lost 10 pounds in the last 4 weeks. -She does not have any appetite.  We will start her on Marinol 5 mg twice daily.  6.  Abdominal pain: -She is taking hydrocodone 7.5 mg as needed.

## 2020-04-15 NOTE — Progress Notes (Signed)
Labs reviewed today with MD. Will hold treatment today per MD. Will give hydration fluids with potassium over 90 min per MD  and some zofran for nausea as ordered.    Patient tolerated it well without problems. Vitals stable and discharged home from clinic via wheelchair. Follow up as scheduled.

## 2020-04-17 ENCOUNTER — Other Ambulatory Visit (HOSPITAL_COMMUNITY): Payer: Self-pay | Admitting: *Deleted

## 2020-04-17 ENCOUNTER — Other Ambulatory Visit: Payer: Self-pay

## 2020-04-17 ENCOUNTER — Ambulatory Visit (HOSPITAL_COMMUNITY): Payer: Medicare Other

## 2020-04-17 ENCOUNTER — Ambulatory Visit (HOSPITAL_COMMUNITY): Payer: Medicare Other | Admitting: Hematology

## 2020-04-17 ENCOUNTER — Ambulatory Visit (HOSPITAL_COMMUNITY)
Admission: RE | Admit: 2020-04-17 | Discharge: 2020-04-17 | Disposition: A | Discharge status: Home / Self Care | Payer: Medicare Other | Source: Ambulatory Visit | Attending: Hematology | Admitting: Hematology

## 2020-04-17 ENCOUNTER — Encounter (HOSPITAL_COMMUNITY): Payer: Medicare Other

## 2020-04-17 ENCOUNTER — Other Ambulatory Visit (HOSPITAL_COMMUNITY): Payer: Medicare Other

## 2020-04-17 DIAGNOSIS — C787 Secondary malignant neoplasm of liver and intrahepatic bile duct: Secondary | ICD-10-CM | POA: Insufficient documentation

## 2020-04-17 DIAGNOSIS — R17 Unspecified jaundice: Secondary | ICD-10-CM

## 2020-04-17 DIAGNOSIS — C221 Intrahepatic bile duct carcinoma: Secondary | ICD-10-CM

## 2020-04-17 MED ORDER — GADOBUTROL 1 MMOL/ML IV SOLN
8.0000 mL | Freq: Once | INTRAVENOUS | Status: AC | PRN
  Administered 2020-04-17: 17:00:00 8 mL via INTRAVENOUS

## 2020-04-18 ENCOUNTER — Other Ambulatory Visit: Payer: Self-pay

## 2020-04-18 ENCOUNTER — Emergency Department (HOSPITAL_COMMUNITY): Payer: Medicare Other

## 2020-04-18 ENCOUNTER — Inpatient Hospital Stay (HOSPITAL_COMMUNITY)
Admission: EM | Admit: 2020-04-18 | Discharge: 2020-05-07 | DRG: 871 | Disposition: E | Payer: Medicare Other | Attending: Internal Medicine | Admitting: Internal Medicine

## 2020-04-18 ENCOUNTER — Encounter (HOSPITAL_COMMUNITY): Payer: Self-pay | Admitting: Emergency Medicine

## 2020-04-18 DIAGNOSIS — Z8249 Family history of ischemic heart disease and other diseases of the circulatory system: Secondary | ICD-10-CM | POA: Diagnosis not present

## 2020-04-18 DIAGNOSIS — R188 Other ascites: Secondary | ICD-10-CM

## 2020-04-18 DIAGNOSIS — G893 Neoplasm related pain (acute) (chronic): Secondary | ICD-10-CM | POA: Diagnosis present

## 2020-04-18 DIAGNOSIS — I10 Essential (primary) hypertension: Secondary | ICD-10-CM

## 2020-04-18 DIAGNOSIS — Z20822 Contact with and (suspected) exposure to covid-19: Secondary | ICD-10-CM | POA: Diagnosis present

## 2020-04-18 DIAGNOSIS — Z96642 Presence of left artificial hip joint: Secondary | ICD-10-CM | POA: Diagnosis present

## 2020-04-18 DIAGNOSIS — C787 Secondary malignant neoplasm of liver and intrahepatic bile duct: Secondary | ICD-10-CM

## 2020-04-18 DIAGNOSIS — N1832 Chronic kidney disease, stage 3b: Secondary | ICD-10-CM | POA: Diagnosis present

## 2020-04-18 DIAGNOSIS — R180 Malignant ascites: Secondary | ICD-10-CM | POA: Diagnosis present

## 2020-04-18 DIAGNOSIS — R6520 Severe sepsis without septic shock: Secondary | ICD-10-CM | POA: Diagnosis present

## 2020-04-18 DIAGNOSIS — A4151 Sepsis due to Escherichia coli [E. coli]: Secondary | ICD-10-CM | POA: Diagnosis present

## 2020-04-18 DIAGNOSIS — Z66 Do not resuscitate: Secondary | ICD-10-CM | POA: Diagnosis present

## 2020-04-18 DIAGNOSIS — K831 Obstruction of bile duct: Secondary | ICD-10-CM | POA: Diagnosis present

## 2020-04-18 DIAGNOSIS — N83209 Unspecified ovarian cyst, unspecified side: Secondary | ICD-10-CM | POA: Diagnosis present

## 2020-04-18 DIAGNOSIS — A419 Sepsis, unspecified organism: Secondary | ICD-10-CM

## 2020-04-18 DIAGNOSIS — Z79899 Other long term (current) drug therapy: Secondary | ICD-10-CM

## 2020-04-18 DIAGNOSIS — R451 Restlessness and agitation: Secondary | ICD-10-CM | POA: Diagnosis present

## 2020-04-18 DIAGNOSIS — F32A Depression, unspecified: Secondary | ICD-10-CM | POA: Diagnosis present

## 2020-04-18 DIAGNOSIS — K652 Spontaneous bacterial peritonitis: Secondary | ICD-10-CM | POA: Diagnosis present

## 2020-04-18 DIAGNOSIS — K219 Gastro-esophageal reflux disease without esophagitis: Secondary | ICD-10-CM | POA: Diagnosis present

## 2020-04-18 DIAGNOSIS — K8309 Other cholangitis: Secondary | ICD-10-CM | POA: Diagnosis present

## 2020-04-18 DIAGNOSIS — C801 Malignant (primary) neoplasm, unspecified: Secondary | ICD-10-CM | POA: Diagnosis present

## 2020-04-18 DIAGNOSIS — N179 Acute kidney failure, unspecified: Secondary | ICD-10-CM | POA: Diagnosis present

## 2020-04-18 DIAGNOSIS — F17210 Nicotine dependence, cigarettes, uncomplicated: Secondary | ICD-10-CM | POA: Diagnosis present

## 2020-04-18 DIAGNOSIS — F329 Major depressive disorder, single episode, unspecified: Secondary | ICD-10-CM | POA: Diagnosis present

## 2020-04-18 DIAGNOSIS — C221 Intrahepatic bile duct carcinoma: Secondary | ICD-10-CM | POA: Diagnosis present

## 2020-04-18 DIAGNOSIS — F419 Anxiety disorder, unspecified: Secondary | ICD-10-CM | POA: Diagnosis present

## 2020-04-18 DIAGNOSIS — I129 Hypertensive chronic kidney disease with stage 1 through stage 4 chronic kidney disease, or unspecified chronic kidney disease: Secondary | ICD-10-CM | POA: Diagnosis present

## 2020-04-18 DIAGNOSIS — C799 Secondary malignant neoplasm of unspecified site: Secondary | ICD-10-CM

## 2020-04-18 LAB — CBC WITH DIFFERENTIAL/PLATELET
Abs Immature Granulocytes: 0.23 10*3/uL — ABNORMAL HIGH (ref 0.00–0.07)
Basophils Absolute: 0.1 10*3/uL (ref 0.0–0.1)
Basophils Relative: 0 %
Eosinophils Absolute: 0 10*3/uL (ref 0.0–0.5)
Eosinophils Relative: 0 %
HCT: 27.2 % — ABNORMAL LOW (ref 36.0–46.0)
Hemoglobin: 8.6 g/dL — ABNORMAL LOW (ref 12.0–15.0)
Immature Granulocytes: 1 %
Lymphocytes Relative: 5 %
Lymphs Abs: 1 10*3/uL (ref 0.7–4.0)
MCH: 32.2 pg (ref 26.0–34.0)
MCHC: 31.6 g/dL (ref 30.0–36.0)
MCV: 101.9 fL — ABNORMAL HIGH (ref 80.0–100.0)
Monocytes Absolute: 1.6 10*3/uL — ABNORMAL HIGH (ref 0.1–1.0)
Monocytes Relative: 8 %
Neutro Abs: 18.3 10*3/uL — ABNORMAL HIGH (ref 1.7–7.7)
Neutrophils Relative %: 86 %
Platelets: 199 10*3/uL (ref 150–400)
RBC: 2.67 MIL/uL — ABNORMAL LOW (ref 3.87–5.11)
RDW: 19.9 % — ABNORMAL HIGH (ref 11.5–15.5)
WBC: 21.6 10*3/uL — ABNORMAL HIGH (ref 4.0–10.5)
nRBC: 1.2 % — ABNORMAL HIGH (ref 0.0–0.2)

## 2020-04-18 LAB — URINALYSIS, ROUTINE W REFLEX MICROSCOPIC
Glucose, UA: NEGATIVE mg/dL
Hgb urine dipstick: NEGATIVE
Ketones, ur: NEGATIVE mg/dL
Nitrite: NEGATIVE
Protein, ur: 30 mg/dL — AB
Specific Gravity, Urine: 1.016 (ref 1.005–1.030)
pH: 5 (ref 5.0–8.0)

## 2020-04-18 LAB — CREATININE, SERUM
Creatinine, Ser: 2.35 mg/dL — ABNORMAL HIGH (ref 0.44–1.00)
GFR calc Af Amer: 25 mL/min — ABNORMAL LOW (ref >60)
GFR calc non Af Amer: 21 mL/min — ABNORMAL LOW (ref >60)

## 2020-04-18 LAB — LACTATE DEHYDROGENASE, PLEURAL OR PERITONEAL FLUID: LD, Fluid: 269 U/L — ABNORMAL HIGH (ref 3–23)

## 2020-04-18 LAB — LACTIC ACID, PLASMA: Lactic Acid, Venous: 10 mmol/L (ref 0.5–1.9)

## 2020-04-18 LAB — COMPREHENSIVE METABOLIC PANEL
ALT: 30 U/L (ref 0–44)
AST: 51 U/L — ABNORMAL HIGH (ref 15–41)
Albumin: 2.3 g/dL — ABNORMAL LOW (ref 3.5–5.0)
Alkaline Phosphatase: 375 U/L — ABNORMAL HIGH (ref 38–126)
Anion gap: 20 — ABNORMAL HIGH (ref 5–15)
BUN: 21 mg/dL (ref 8–23)
CO2: 14 mmol/L — ABNORMAL LOW (ref 22–32)
Calcium: 8.9 mg/dL (ref 8.9–10.3)
Chloride: 107 mmol/L (ref 98–111)
Creatinine, Ser: 2.25 mg/dL — ABNORMAL HIGH (ref 0.44–1.00)
GFR calc Af Amer: 26 mL/min — ABNORMAL LOW (ref >60)
GFR calc non Af Amer: 22 mL/min — ABNORMAL LOW (ref >60)
Glucose, Bld: 140 mg/dL — ABNORMAL HIGH (ref 70–99)
Potassium: 3.7 mmol/L (ref 3.5–5.1)
Sodium: 141 mmol/L (ref 135–145)
Total Bilirubin: 5.2 mg/dL — ABNORMAL HIGH (ref 0.3–1.2)
Total Protein: 7.3 g/dL (ref 6.5–8.1)

## 2020-04-18 LAB — CBC
HCT: 24.8 % — ABNORMAL LOW (ref 36.0–46.0)
Hemoglobin: 8.1 g/dL — ABNORMAL LOW (ref 12.0–15.0)
MCH: 32.3 pg (ref 26.0–34.0)
MCHC: 32.7 g/dL (ref 30.0–36.0)
MCV: 98.8 fL (ref 80.0–100.0)
Platelets: 179 10*3/uL (ref 150–400)
RBC: 2.51 MIL/uL — ABNORMAL LOW (ref 3.87–5.11)
RDW: 19.8 % — ABNORMAL HIGH (ref 11.5–15.5)
WBC: 16.9 10*3/uL — ABNORMAL HIGH (ref 4.0–10.5)
nRBC: 0.8 % — ABNORMAL HIGH (ref 0.0–0.2)

## 2020-04-18 LAB — GLUCOSE, PLEURAL OR PERITONEAL FLUID: Glucose, Fluid: <20 mg/dL

## 2020-04-18 LAB — BODY FLUID CELL COUNT WITH DIFFERENTIAL
Eos, Fluid: 0 %
Lymphs, Fluid: 6 %
Monocyte-Macrophage-Serous Fluid: 15 % — ABNORMAL LOW (ref 50–90)
Neutrophil Count, Fluid: 79 % — ABNORMAL HIGH (ref 0–25)
Total Nucleated Cell Count, Fluid: 445 cu mm (ref 0–1000)

## 2020-04-18 LAB — MAGNESIUM: Magnesium: 2 mg/dL (ref 1.7–2.4)

## 2020-04-18 LAB — LIPASE, BLOOD: Lipase: 15 U/L (ref 11–51)

## 2020-04-18 LAB — TYPE AND SCREEN
ABO/RH(D): B POS
Antibody Screen: NEGATIVE

## 2020-04-18 LAB — SARS CORONAVIRUS 2 BY RT PCR (HOSPITAL ORDER, PERFORMED IN ~~LOC~~ HOSPITAL LAB): SARS Coronavirus 2: NEGATIVE

## 2020-04-18 LAB — PHOSPHORUS: Phosphorus: 3.3 mg/dL (ref 2.5–4.6)

## 2020-04-18 LAB — GRAM STAIN

## 2020-04-18 LAB — PROTEIN, PLEURAL OR PERITONEAL FLUID: Total protein, fluid: <3 g/dL

## 2020-04-18 MED ORDER — HEPARIN SODIUM (PORCINE) 5000 UNIT/ML IJ SOLN
5000.0000 [IU] | Freq: Three times a day (TID) | INTRAMUSCULAR | Status: DC
  Administered 2020-04-18: 5000 [IU] via SUBCUTANEOUS
  Filled 2020-04-18: qty 1

## 2020-04-18 MED ORDER — ONDANSETRON HCL 4 MG/2ML IJ SOLN
4.0000 mg | Freq: Once | INTRAMUSCULAR | Status: AC
  Administered 2020-04-18: 4 mg via INTRAVENOUS
  Filled 2020-04-18: qty 2

## 2020-04-18 MED ORDER — DOXAZOSIN MESYLATE 2 MG PO TABS
2.0000 mg | ORAL_TABLET | Freq: Every day | ORAL | Status: DC
  Administered 2020-04-18: 2 mg via ORAL
  Filled 2020-04-18 (×2): qty 1

## 2020-04-18 MED ORDER — AMLODIPINE BESYLATE 5 MG PO TABS
10.0000 mg | ORAL_TABLET | Freq: Every day | ORAL | Status: DC
  Filled 2020-04-18: qty 2

## 2020-04-18 MED ORDER — SODIUM CHLORIDE 0.9 % IV SOLN
2.0000 g | INTRAVENOUS | Status: DC
  Administered 2020-04-18: 2 g via INTRAVENOUS
  Filled 2020-04-18: qty 20

## 2020-04-18 MED ORDER — SODIUM CHLORIDE 0.9 % IV SOLN: INTRAVENOUS | Status: DC

## 2020-04-18 MED ORDER — HYDROMORPHONE HCL 1 MG/ML IJ SOLN
1.0000 mg | Freq: Once | INTRAMUSCULAR | Status: AC
  Administered 2020-04-18: 1 mg via INTRAVENOUS
  Filled 2020-04-18: qty 1

## 2020-04-18 MED ORDER — LACTATED RINGERS IV BOLUS
1000.0000 mL | Freq: Once | INTRAVENOUS | Status: AC
  Administered 2020-04-19: 1000 mL via INTRAVENOUS

## 2020-04-18 MED ORDER — HYDRALAZINE HCL 20 MG/ML IJ SOLN
10.0000 mg | Freq: Three times a day (TID) | INTRAMUSCULAR | Status: DC | PRN
  Administered 2020-04-18: 10 mg via INTRAVENOUS
  Filled 2020-04-18: qty 1

## 2020-04-18 MED ORDER — ONDANSETRON HCL 4 MG/2ML IJ SOLN: 4.0000 mg | Freq: Four times a day (QID) | INTRAMUSCULAR | Status: DC | PRN

## 2020-04-18 MED ORDER — SODIUM BICARBONATE 8.4 % IV SOLN
100.0000 meq | Freq: Once | INTRAVENOUS | Status: AC
  Administered 2020-04-19: 100 meq via INTRAVENOUS

## 2020-04-18 MED ORDER — ACETAMINOPHEN 650 MG RE SUPP: 650.0000 mg | Freq: Four times a day (QID) | RECTAL | Status: DC | PRN

## 2020-04-18 MED ORDER — ACETAMINOPHEN 325 MG PO TABS: 650.0000 mg | ORAL_TABLET | Freq: Four times a day (QID) | ORAL | Status: DC | PRN

## 2020-04-18 MED ORDER — METOPROLOL TARTRATE 5 MG/5ML IV SOLN: 5.0000 mg | Freq: Once | INTRAVENOUS | Status: DC

## 2020-04-18 MED ORDER — HYDROMORPHONE HCL 1 MG/ML IJ SOLN
1.0000 mg | INTRAMUSCULAR | Status: DC | PRN
  Administered 2020-04-18: 1 mg via INTRAVENOUS
  Filled 2020-04-18: qty 1

## 2020-04-18 MED ORDER — HYDROMORPHONE HCL 1 MG/ML IJ SOLN: 1.0000 mg | Freq: Once | INTRAMUSCULAR | Status: DC

## 2020-04-18 MED ORDER — LORAZEPAM 2 MG/ML IJ SOLN: 0.7500 mg | INTRAMUSCULAR | Status: DC | PRN

## 2020-04-18 MED ORDER — HYDROMORPHONE HCL 1 MG/ML IJ SOLN: 1.0000 mg | INTRAMUSCULAR | Status: DC | PRN

## 2020-04-18 MED ORDER — ONDANSETRON HCL 4 MG PO TABS: 4.0000 mg | ORAL_TABLET | Freq: Four times a day (QID) | ORAL | Status: DC | PRN

## 2020-04-18 MED ORDER — SODIUM CHLORIDE 0.9 % IV SOLN
3.0000 g | Freq: Three times a day (TID) | INTRAVENOUS | Status: DC
  Administered 2020-04-18: 3 g via INTRAVENOUS
  Filled 2020-04-18 (×3): qty 8

## 2020-04-18 MED ORDER — PROCHLORPERAZINE EDISYLATE 10 MG/2ML IJ SOLN
5.0000 mg | INTRAMUSCULAR | Status: DC | PRN
  Administered 2020-04-18: 5 mg via INTRAVENOUS
  Filled 2020-04-18: qty 2

## 2020-04-18 MED ORDER — SODIUM CHLORIDE 0.9 % IV BOLUS
500.0000 mL | Freq: Once | INTRAVENOUS | Status: AC
  Administered 2020-04-18: 500 mL via INTRAVENOUS

## 2020-04-18 MED ORDER — PANTOPRAZOLE SODIUM 40 MG PO TBEC: 40.0000 mg | DELAYED_RELEASE_TABLET | Freq: Every day | ORAL | Status: DC

## 2020-04-18 MED ORDER — METOPROLOL TARTRATE 5 MG/5ML IV SOLN: 5.0000 mg | Freq: Four times a day (QID) | INTRAVENOUS | Status: DC | PRN

## 2020-04-18 MED ORDER — NICOTINE 14 MG/24HR TD PT24: 14.0000 mg | MEDICATED_PATCH | Freq: Every day | TRANSDERMAL | Status: DC

## 2020-04-18 MED ORDER — CHLORHEXIDINE GLUCONATE CLOTH 2 % EX PADS: 6.0000 | MEDICATED_PAD | Freq: Every day | CUTANEOUS | Status: DC

## 2020-04-18 NOTE — Progress Notes (Signed)
Pt going insane and trying to get OOB- alert and answering appropriate questions. MD made aware of Lactic of 10- Lab in room trying to redraw labs. Pt RR 38- BP 151-84- HR 110-130. Pt has been given Hydralazine, Cardura and Dilaudid. Pt was A&Ox4 when admitted to floor. Pt now states she does not want to be intubabed, DNR band placed. Pt are nonresponsive, 4 Will give appropriate medications and continue to monitor pt

## 2020-04-18 NOTE — ED Notes (Signed)
Date and time results received: 05/06/2020 1530 (use smartphrase ".now" to insert current time)  Test: gram stain  Critical Value: gram negative rods  Name of Provider Notified: carlos,md  Orders Received? Or Actions Taken?:

## 2020-04-18 NOTE — ED Provider Notes (Addendum)
Client [facility] EMERGENCY DEPARTMENT Provider Note   CSN: [id number] Arrival date & time: 05/03/2020  1039     History Chief Complaint  Patient presents with  . Shortness of Breath    Nicole Ibarra is a 64 y.o. female.  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 Patient has history of biliary stent. Patient states she was at her oncologist office on May 10. Patient was noted to have increasing difficulty with increasing hyperbilirubinemia. Patient also had increased creatinine. She was hydrated. Patient had follow-up imaging procedures done yesterday. Patient had an MRI MRCP of her abdomen yesterday. The findings showed progression of her disease. There is also a large multilocular cystic lesion emanating from the pelvis concerning for ovarian neoplasm. Patient states she started having increasing abdominal pain this morning and that is what brought her to the emergency room. Patient states she was also having difficulty breathing. When I asked the patient about the abdominal swelling that she has she indicated this was not new. She denies any fevers or chills.  Past Medical History:  Diagnosis Date  . Anxiety   . Arthritis   . Cancer ([place]) 01/2019   Metastatic cholangiocarcinoma  . Depression   . Hypertension     Patient Active Problem List   Diagnosis Date Noted  . Bloating 02/12/2020  . Constipation 02/12/2020  . Obstructive jaundice due to malignant neoplasm ([place]) 01/22/2020  . Ovarian cystic mass 01/22/2020  . Cholangiocarcinoma metastatic to liver ([place]) 02/08/2019  . Liver mass 01/09/2019  . Special screening for malignant neoplasms, colon   . 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 OSTEOARTHRITIS, HIP, RIGHT 07/07/2010  . ALLERGIC RHINITIS 08/07/2008  . GERD 08/07/2008  . MORBID OBESITY 06/28/2008  . TOBACCO ABUSE 06/28/2008  . ANEMIA, NORMOCYTIC 03/13/2008  . PROTEINURIA 03/13/2008  . HYPERLIPIDEMIA 01/10/2007  . ANXIETY 01/10/2007    . DEPRESSION 01/10/2007  . MIGRAINE HEADACHE 01/10/2007  . HYPERTENSION 01/10/2007  . PUD 01/10/2007  . OSTEOARTHRITIS 01/10/2007  . LOW BACK PAIN 01/10/2007    Past Surgical History:  Procedure Laterality Date  . BILIARY STENT PLACEMENT N/A 01/24/2020   Procedure: BILIARY STENT PLACEMENT;  Surgeon: Rogene Houston, MD;  Location: [facility];  Service: Endoscopy;  Laterality: N/A;  . COLONOSCOPY N/A 10/23/2016   Procedure: COLONOSCOPY;  Surgeon: Danie Binder, MD;  Location: [facility];  Service: Endoscopy;  Laterality: N/A;  11:30 Am  . ERCP N/A 01/24/2020   Procedure: ENDOSCOPIC RETROGRADE CHOLANGIOPANCREATOGRAPHY (ERCP);  Surgeon: Rogene Houston, MD;  Location: [facility];  Service: Endoscopy;  Laterality: N/A;  . POLYPECTOMY  10/23/2016   Procedure: POLYPECTOMY;  Surgeon: Danie Binder, MD;  Location: [facility];  Service: Endoscopy;;  sigmoid colon polyp  . PORTACATH PLACEMENT Left 02/16/2019   Procedure: INSERTION PORT-A-CATH (attached catheter in left subclavian);  Surgeon: Virl Cagey, MD;  Location: [facility] ORS;  Service: General;  Laterality: Left;  . RT HIP SURGERY    . SPHINCTEROTOMY N/A 01/24/2020   Procedure: SPHINCTEROTOMY;  Surgeon: Rogene Houston, MD;  Location: [facility];  Service: Endoscopy;  Laterality: N/A;  . TOTAL HIP ARTHROPLASTY Left 04/07/2016   Procedure: LEFT TOTAL HIP ARTHROPLASTY ANTERIOR APPROACH;  Surgeon: Paralee Cancel, MD;  Location: [facility] ORS;  Service: Orthopedics;  Laterality: Left;  Unsuccessful for Spinal, went to General     OB History   No obstetric history on file.     Family  History  Problem Relation Age of Onset  . Hypertension Mother   . Cancer Father   . Hypertension Brother   . Cancer Brother   . Hypertension Sister   . Cancer Sister     Social History   Tobacco Use  . Smoking status: Current Every Day Smoker    Packs/day: 0.25    Years: 42.00    Pack years: 10.50  . Smokeless tobacco: Never Used   Substance Use Topics  . Alcohol use: No  . Drug use: No    Home Medications Prior to Admission medications   Medication Sig Start Date End Date Taking? Authorizing Provider  amLODipine (NORVASC) 10 MG tablet TAKE 1 TABLET BY MOUTH EVERY DAY 02/26/20  Yes Derek Jack, MD  doxazosin (CARDURA) 2 MG tablet Take 2 mg by mouth at bedtime.    Yes [provider]  fluconazole (DIFLUCAN) 100 MG tablet Take 1 tablet (100 mg total) by mouth daily. Take 2 tablets today and then take 1 tablet daily for 6 days 04/15/20  Yes Derek Jack, MD  lidocaine (XYLOCAINE) 2 % solution Swish and swallow 1 tablespoon four times a day prn sore mouth 03/23/19  Yes Derek Jack, MD  ondansetron (ZOFRAN-ODT) 8 MG disintegrating tablet Take 1 tablet (8 mg total) by mouth every 8 (eight) hours as needed for nausea or vomiting. 03/29/20  Yes Lockamy, Randi L, NP-C  pantoprazole (PROTONIX) 40 MG tablet Take 1 tablet (40 mg total) by mouth daily before breakfast. 01/24/20  Yes Rehman, Mechele Dawley, MD  dronabinol (MARINOL) 5 MG capsule Take 1 capsule (5 mg total) by mouth 2 (two) times daily before a meal. 04/15/20   Derek Jack, MD  HYDROcodone-acetaminophen (NORCO) 7.5-325 MG tablet Take 1-2 tablets by mouth every 6 (six) hours as needed for moderate pain. Patient not taking: Reported on 03/25/2020 03/05/20   Francene Finders L, NP-C  lidocaine-prilocaine (EMLA) cream Apply to skin over port a cath one hour prior to chemotherapy appointment Patient not taking: Reported on 03/25/2020 06/22/19   Derek Jack, MD  oxyCODONE (OXY IR/ROXICODONE) 5 MG immediate release tablet Take 1 tablet (5 mg total) by mouth every 4 (four) hours as needed for severe pain. Patient not taking: Reported on 03/25/2020 01/24/20   Rogene Houston, MD  polyethylene glycol powder (GLYCOLAX/MIRALAX) 17 GM/SCOOP powder Take 17 g by mouth daily. Patient not taking: Reported on 03/25/2020 02/12/20   Rogene Houston, MD   potassium chloride (KLOR-CON) 10 MEQ tablet TAKE 2 TABLETS (20 MEQ TOTAL) BY MOUTH 2 (TWO) TIMES DAILY. Patient not taking: No sig reported 11/21/19   Derek Jack, MD  prochlorperazine (COMPAZINE) 10 MG tablet TAKE 1 TABLET (10 MG TOTAL) BY MOUTH EVERY 6 (SIX) HOURS AS NEEDED (NAUSEA OR VOMITING). Patient not taking: Reported on 03/25/2020 08/30/19   Glennie Isle, NP-C  Simethicone (PHAZYME) 180 MG CAPS Take 1 capsule (180 mg total) by mouth 3 (three) times daily as needed. Patient not taking: Reported on 03/25/2020 02/12/20   Rogene Houston, MD  prochlorperazine (COMPAZINE) 10 MG tablet TAKE 1 TABLET (10 MG TOTAL) BY MOUTH EVERY 6 (SIX) HOURS AS NEEDED (NAUSEA OR VOMITING). 08/02/19   Glennie Isle, NP-C    Allergies    Patient has no known allergies.  Review of Systems   Review of Systems  All other systems reviewed and are negative.   Physical Exam Updated Vital Signs BP (!) 140/98 (BP Location: Left Arm)   Pulse (!) 112  Temp 98.1 F (36.7 C) (Oral)   Resp (!) 28   Ht 1.626 m (5\' 4" )   Wt 79.4 kg   SpO2 100%   BMI 30.04 kg/m   Physical Exam Vitals and nursing note reviewed.  Constitutional:      General: She is not in acute distress.    Appearance: She is well-developed. She is ill-appearing.  HENT:     Head: Normocephalic and atraumatic.     Right Ear: External ear normal.     Left Ear: External ear normal.  Eyes:     General: No scleral icterus.       Right eye: No discharge.        Left eye: No discharge.     Conjunctiva/sclera: Conjunctivae normal.  Neck:     Trachea: No tracheal deviation.  Cardiovascular:     Rate and Rhythm: Regular rhythm. Tachycardia present.  Pulmonary:     Effort: Pulmonary effort is normal. No respiratory distress.     Breath sounds: Normal breath sounds. No stridor. No wheezing or rales.  Abdominal:     General: Bowel sounds are normal. There is distension.     Palpations: Abdomen is soft. There is fluid wave and  hepatomegaly.     Tenderness: There is no abdominal tenderness. There is no guarding or rebound.     Comments: Abdomen distended  Musculoskeletal:        General: No tenderness.     Cervical back: Neck supple.  Skin:    General: Skin is warm and dry.     Findings: No rash.  Neurological:     Mental Status: She is alert.     Cranial Nerves: No cranial nerve deficit (no facial droop, extraocular movements intact, no slurred speech).     Sensory: No sensory deficit.     Motor: No abnormal muscle tone or seizure activity.     Coordination: Coordination normal.     ED Results / Procedures / Treatments   Labs (all labs ordered are listed, but only abnormal results are displayed) Labs Reviewed  COMPREHENSIVE METABOLIC PANEL - Abnormal; Notable for the following components:      Result Value   CO2 14 (*)    Glucose, Bld 140 (*)    Creatinine, Ser 2.25 (*)    Albumin 2.3 (*)    AST 51 (*)    Alkaline Phosphatase 375 (*)    Total Bilirubin 5.2 (*)    GFR calc non Af Amer 22 (*)    GFR calc Af Amer 26 (*)    Anion gap 20 (*)    All other components within normal limits  CBC WITH DIFFERENTIAL/PLATELET - Abnormal; Notable for the following components:   WBC 21.6 (*)    RBC 2.67 (*)    Hemoglobin 8.6 (*)    HCT 27.2 (*)    MCV 101.9 (*)    RDW 19.9 (*)    nRBC 1.2 (*)    Neutro Abs 18.3 (*)    Monocytes Absolute 1.6 (*)    Abs Immature Granulocytes 0.23 (*)    All other components within normal limits  SARS CORONAVIRUS 2 BY RT PCR (HOSPITAL ORDER, [place] [facility])  CULTURE, BLOOD (ROUTINE X 2)  CULTURE, BLOOD (ROUTINE X 2)  GRAM STAIN  CULTURE, BODY FLUID-BOTTLE  LIPASE, BLOOD  URINALYSIS, ROUTINE W REFLEX MICROSCOPIC  BODY FLUID CELL COUNT WITH DIFFERENTIAL  LACTATE DEHYDROGENASE, PLEURAL OR PERITONEAL FLUID  PROTEIN, BODY FLUID (OTHER)  GLUCOSE, PLEURAL OR PERITONEAL FLUID  TYPE AND SCREEN  CYTOLOGY - NON PAP    EKG None  Radiology MR 3D  Recon At Scanner  Result Date: 04/17/2020 CLINICAL DATA:  65 year old female with history of elevated bilirubin. History of cholangiocarcinoma. EXAM: MRI ABDOMEN WITHOUT AND WITH CONTRAST (INCLUDING MRCP) TECHNIQUE: Multiplanar multisequence MR imaging of the abdomen was performed both before and after the administration of intravenous contrast. Heavily T2-weighted images of the biliary and pancreatic ducts were obtained, and three-dimensional MRCP images were rendered by post processing. CONTRAST:  65mL GADAVIST GADOBUTROL 1 MMOL/ML IV SOLN COMPARISON:  Abdominal MRI 01/19/2020. FINDINGS: Comment: Portions of today's examination are limited by extensive patient respiratory motion. Lower chest: Unremarkable. Hepatobiliary: Moderate intrahepatic biliary ductal dilatation again noted. In the central aspect of the liver there is abrupt cut off of central intrahepatic bile ducts as well as the proximal common bile duct and cystic duct, in an area of diffuse amorphous delayed enhancement best appreciated on delayed post gadolinium imaging (axial images 28-32 of series 27). This mass is very irregular in shape and therefore difficult to accurately measure, but is estimated to measure approximately 5.4 x 3.1 cm (axial image 29 of series 27). In addition, there are areas of abnormal soft tissue extending from the fundus of the gallbladder into the adjacent hepatic parenchyma involving predominantly segment 5 where there is amorphous low T1 signal intensity on precontrast images which corresponds to progressive delayed enhancement on post gadolinium images, compatible with additional areas of malignant infiltration. This measures approximately 2.5 x 2.3 cm on axial image 33 of series 21, and there is an additional lesion noted more inferiorly on axial image 38 of series 21 measuring 2.7 x 2.2 cm. In segment 8 of the liver there is also a well-defined 2.2 x 1.8 cm T1 hypointense, T2 hyperintense, nonenhancing lesion,  compatible with a simple cyst. Pancreas: No pancreatic mass. No pancreatic ductal dilatation. No pancreatic or peripancreatic fluid collections or inflammatory changes. Spleen:  Unremarkable. Adrenals/Urinary Tract: Bilateral kidneys and adrenal glands are normal in appearance. No hydroureteronephrosis in the visualized portions of the abdomen. Stomach/Bowel: Visualized portions are unremarkable. Vascular/Lymphatic: No aneurysm identified in the visualized abdominal vasculature. Other: Large multilocular cystic lesion in the pelvis incompletely imaged, but measuring at least 22.6 x 11.9 cm, previously demonstrated on CT examinations to be adnexal in origin, presumably a large ovarian neoplasm. Musculoskeletal: No aggressive appearing osseous lesions are noted in the visualized portions of the skeleton. IMPRESSION: 1. Today's study demonstrates progression of disease with enlargement of the amorphous area of delayed enhancement in the hepatic hilum associated with chronic intrahepatic biliary ductal dilatation, compatible with reported clinical history of cholangiocarcinoma. There is additional malignant infiltration of soft tissue from the fundus of the gallbladder into the adjacent portions of segment 5 of the liver, as detailed above. 2. Large multilocular cystic lesion emanating from the pelvis incompletely imaged, presumably a large ovarian neoplasm. Electronically Signed   By: Vinnie Langton M.D.   On: 04/17/2020 18:16   DG Chest Portable 1 View  Result Date: 05/01/2020 CLINICAL DATA:  Weakness and SOB. EXAM: PORTABLE CHEST 1 VIEW COMPARISON:  02/16/2019 FINDINGS: There is a left chest wall port a catheter with tip at the SVC. Heart size appears within normal limits. No pleural effusion or edema. Lungs are clear. IMPRESSION: No active disease. Electronically Signed   By: Kerby Moors M.D.   On: 04/20/2020 11:55   MR ABDOMEN MRCP W WO CONTAST  Result Date:  04/17/2020 CLINICAL DATA:  64 year old  female with history of elevated bilirubin. History of cholangiocarcinoma. EXAM: MRI ABDOMEN WITHOUT AND WITH CONTRAST (INCLUDING MRCP) TECHNIQUE: Multiplanar multisequence MR imaging of the abdomen was performed both before and after the administration of intravenous contrast. Heavily T2-weighted images of the biliary and pancreatic ducts were obtained, and three-dimensional MRCP images were rendered by post processing. CONTRAST:  36mL GADAVIST GADOBUTROL 1 MMOL/ML IV SOLN COMPARISON:  Abdominal MRI 01/19/2020. FINDINGS: Comment: Portions of today's examination are limited by extensive patient respiratory motion. Lower chest: Unremarkable. Hepatobiliary: Moderate intrahepatic biliary ductal dilatation again noted. In the central aspect of the liver there is abrupt cut off of central intrahepatic bile ducts as well as the proximal common bile duct and cystic duct, in an area of diffuse amorphous delayed enhancement best appreciated on delayed post gadolinium imaging (axial images 28-32 of series 27). This mass is very irregular in shape and therefore difficult to accurately measure, but is estimated to measure approximately 5.4 x 3.1 cm (axial image 29 of series 27). In addition, there are areas of abnormal soft tissue extending from the fundus of the gallbladder into the adjacent hepatic parenchyma involving predominantly segment 5 where there is amorphous low T1 signal intensity on precontrast images which corresponds to progressive delayed enhancement on post gadolinium images, compatible with additional areas of malignant infiltration. This measures approximately 2.5 x 2.3 cm on axial image 33 of series 21, and there is an additional lesion noted more inferiorly on axial image 38 of series 21 measuring 2.7 x 2.2 cm. In segment 8 of the liver there is also a well-defined 2.2 x 1.8 cm T1 hypointense, T2 hyperintense, nonenhancing lesion, compatible with a simple cyst. Pancreas: No pancreatic mass. No pancreatic  ductal dilatation. No pancreatic or peripancreatic fluid collections or inflammatory changes. Spleen:  Unremarkable. Adrenals/Urinary Tract: Bilateral kidneys and adrenal glands are normal in appearance. No hydroureteronephrosis in the visualized portions of the abdomen. Stomach/Bowel: Visualized portions are unremarkable. Vascular/Lymphatic: No aneurysm identified in the visualized abdominal vasculature. Other: Large multilocular cystic lesion in the pelvis incompletely imaged, but measuring at least 22.6 x 11.9 cm, previously demonstrated on CT examinations to be adnexal in origin, presumably a large ovarian neoplasm. Musculoskeletal: No aggressive appearing osseous lesions are noted in the visualized portions of the skeleton. IMPRESSION: 1. Today's study demonstrates progression of disease with enlargement of the amorphous area of delayed enhancement in the hepatic hilum associated with chronic intrahepatic biliary ductal dilatation, compatible with reported clinical history of cholangiocarcinoma. There is additional malignant infiltration of soft tissue from the fundus of the gallbladder into the adjacent portions of segment 5 of the liver, as detailed above. 2. Large multilocular cystic lesion emanating from the pelvis incompletely imaged, presumably a large ovarian neoplasm. Electronically Signed   By: Vinnie Langton M.D.   On: 04/17/2020 18:16   [place] ASCITES (ABDOMEN LIMITED)  Result Date: 05/01/2020 CLINICAL DATA:  Metastatic cholangiocarcinoma, LEFT lower quadrant pain, suspected ascites EXAM: LIMITED ABDOMEN ULTRASOUND FOR ASCITES TECHNIQUE: Limited ultrasound survey for ascites was performed in all four abdominal quadrants. COMPARISON:  None FINDINGS: Scattered ascites identified within the abdomen. Observed ascites appears complex with scattered internal echoes and a few scattered internal septations. Volume of ascites appears sufficient for paracentesis though uncertain how much will be able to be  removed due to the complexity of the fluid. IMPRESSION: Complex ascites, adequate in volume for paracentesis though uncertain how much can be removed due to the complexity of the observed fluid.  Electronically Signed   By: Lavonia Dana M.D.   On: 04/26/2020 12:37    Procedures .Critical Care Performed by: Dorie Rank, MD Authorized by: Dorie Rank, MD   Critical care provider statement:    Critical care time (minutes):  45   Critical care was time spent personally by me on the following activities:  Discussions with consultants, evaluation of patient's response to treatment, examination of patient, ordering and performing treatments and interventions, ordering and review of laboratory studies, ordering and review of radiographic studies, pulse oximetry, re-evaluation of patient's condition, obtaining history from patient or surrogate and review of old charts   (including critical care time)  Medications Ordered in ED Medications  0.9 %  sodium chloride infusion ( Intravenous New Bag/Given 04/23/2020 1148)  cefTRIAXone (ROCEPHIN) 2 g in sodium chloride 0.9 % 100 mL IVPB (has no administration in time range)  HYDROmorphone (DILAUDID) injection 1 mg (1 mg Intravenous Given 05/01/2020 1153)  ondansetron (ZOFRAN) injection 4 mg (4 mg Intravenous Given 04/24/2020 1151)    ED Course  I have reviewed the triage vital signs and the nursing notes.  Pertinent labs & imaging results that were available during my care of the patient were reviewed by me and considered in my medical decision making (see chart for details).  Clinical Course as of Apr 18 1434  Thu Apr 18, 2020  1149 Brief bedside ultrasound performed the patient does appear to have large amount of ascites   [JK]  1354 Ultrasound paracentesis performed by Dr. Darcella Cheshire.  It was loculated and complex but he was able to obtain 600 cc   [JK]  1355 Labs do show worsening renal insufficiency and stable elevated LFTs   [JK]  1355 Anemia stable.  White  blood cell count is increasing.   [JK]  1434 Discussed with GI.   Will pass on information to Dr Laural Golden   [JK]    Clinical Course User Index [JK] Dorie Rank, MD   MDM Rules/Calculators/A&P                      Patient presented to ED for evaluation of increasing shortness of breath and abdominal pain in the setting of known carcinoma. Patient also has had findings worrisome for ovarian CA. Patient presented with increasing abdominal discomfort and shortness of breath. On exam the patient had notable ascites. Patient's ED work-up was notable for persistent hyperbilirubinemia and persistent anemia. She also has increasing leukocytosis. Patient is afebrile and normotensive. Severe sepsis. Patient was started on IV antibiotics and was also started on IV fluids. Patient had a paracentesis performed by Dr. Darcella Cheshire, radiology. Samples were sent off for analysis. Patient has been covered with antibiotics to cover for spontaneous bacterial peritonitis. Discussed case with Dr. Delton Coombes. I will consult with gastroenterology as Dr. Delton Coombes felt that the patient may require exchange of her biliary stent. Have consulted with Dr. Dyann Kief  who evaluate the patient and admit for further treatment. Final Clinical Impression(s) / ED Diagnoses Final diagnoses:  Ascites  Metastatic malignant neoplasm, unspecified site [facility])       Dorie Rank, MD 04/06/2020 1435

## 2020-04-18 NOTE — Progress Notes (Signed)
CRITICAL VALUE ALERT  Critical Value:  Lactic acid 10  Date & Time Notied:  04/17/20 @ 2300  Provider Notified: Dr. Olevia Bowens  Orders Received/Actions taken: Provider made aware also that patient is trying to get OOB and very confused at this time which is different than when the patient was admitted. Lactic10, Pts HR 150- BP taking at this time. She has already been given Hydralazine and Cardura. BP 151/84. Waiting for orders/call back. Will continue to monitor pt

## 2020-04-18 NOTE — ED Triage Notes (Addendum)
[facility] called out for [facility] abd pain, upon arrival abd no longer hurting but pt c/o SOB, abdomen  distention, pt has liver cancer with stent placement, HR 131 during triage

## 2020-04-18 NOTE — ED Notes (Addendum)
Md aware of last b/p result. Pt refused amilodipine due to nausea and vomiting.states she cannot tolerate swallowing right now. Provider aware

## 2020-04-18 NOTE — H&P (Signed)
History and Physical    Nicole Ibarra [id number] DOB: [birth year] DOA: 04/06/2020  PCP: Cory Munch, PA-C   Patient coming from: home  I have personally briefly reviewed patient's old medical records in [place]  Chief Complaint: abd pain and SOB  HPI: Nicole Ibarra is a 64 y.o. female with medical history significant of metastatic cholangiocarcinoma, HTN, CKD stage 3b, anxiety/depression, GERD, arthritis and chronic pain related to cancer; who presented to ED with complaints of worsening abd pain and SOB. Pain in her abd started to worsen approx 3 days prior to admission, dull diffuse, intermittent, without alleviating factors and with associated nausea. Patient noticed decrease in her oral intake as well. Patient has also noticed SOB with exertion and was feeling winded with activities. No fever, no chills, no runny nose, no coughing spells, no sore throat, no CP, denies hematuria, dysuria, melena, hematochezia and focal weakness.   Of note, patient had couple episodes of vomiting while in the ED.   ED Course: work up demonstrated patient meeting sepsis criteria with elevated HR, elevated WBC's and RR; also with worsening in her renal function and transaminitis (which is not exactly knew); patient's source of infection is SBP/cholangitis. Patient had paracentesis (620ml removed) while in ED and results demonstrated positive gram stain with gram neg rods. IVF's and antibiotics started, [facility] contacted to admit patient for further evaluation and management of sepsis.  Review of Systems: As per HPI otherwise all other systems reviewed and are negative.   Past Medical History:  Diagnosis Date  . Anxiety   . Arthritis   . Cancer ([place]) 01/2019   Metastatic cholangiocarcinoma  . Depression   . Hypertension     Past Surgical History:  Procedure Laterality Date  . BILIARY STENT PLACEMENT N/A 01/24/2020   Procedure: BILIARY STENT PLACEMENT;  Surgeon: Rogene Houston, MD;   [facility];  Service: Endoscopy;  Laterality: N/A;  . COLONOSCOPY N/A 10/23/2016   Procedure: COLONOSCOPY;  Surgeon: Danie Binder, MD;  [facility];  Service: Endoscopy;  Laterality: N/A;  11:30 Am  . ERCP N/A 01/24/2020   Procedure: ENDOSCOPIC RETROGRADE CHOLANGIOPANCREATOGRAPHY (ERCP);  Surgeon: Rogene Houston, MD;  [facility];  Service: Endoscopy;  Laterality: N/A;  . POLYPECTOMY  10/23/2016   Procedure: POLYPECTOMY;  Surgeon: Danie Binder, MD;  [facility];  Service: Endoscopy;;  sigmoid colon polyp  . PORTACATH PLACEMENT Left 02/16/2019   Procedure: INSERTION PORT-A-CATH (attached catheter in left subclavian);  Surgeon: Virl Cagey, MD;  Location: [facility];  Service: General;  Laterality: Left;  . RT HIP SURGERY    . SPHINCTEROTOMY N/A 01/24/2020   Procedure: SPHINCTEROTOMY;  Surgeon: Rogene Houston, MD;  [facility];  Service: Endoscopy;  Laterality: N/A;  . TOTAL HIP ARTHROPLASTY Left 04/07/2016   Procedure: LEFT TOTAL HIP ARTHROPLASTY ANTERIOR APPROACH;  Surgeon: Paralee Cancel, MD;  Location: [facility];  Service: Orthopedics;  Laterality: Left;  Unsuccessful for Spinal, went to General    Social History  reports that she has been smoking. She has a 10.50 pack-year smoking history. She has never used smokeless tobacco. She reports that she does not drink alcohol or use drugs.  No Known Allergies  Family History  Problem Relation Age of Onset  . Hypertension Mother   . Cancer Father   . Hypertension Brother   . Cancer Brother   . Hypertension Sister   . Cancer Sister  Prior to Admission medications   Medication Sig Start Date End Date Taking? Authorizing Provider  amLODipine (NORVASC) 10 MG tablet TAKE 1 TABLET BY MOUTH EVERY DAY 02/26/20  Yes Derek Jack, MD  doxazosin (CARDURA) 2 MG tablet Take 2 mg by mouth at bedtime.    Yes [provider]  fluconazole (DIFLUCAN) 100 MG tablet Take 1  tablet (100 mg total) by mouth daily. Take 2 tablets today and then take 1 tablet daily for 6 days 04/15/20  Yes Derek Jack, MD  lidocaine (XYLOCAINE) 2 % solution Swish and swallow 1 tablespoon four times a day prn sore mouth 03/23/19  Yes Derek Jack, MD  ondansetron (ZOFRAN-ODT) 8 MG disintegrating tablet Take 1 tablet (8 mg total) by mouth every 8 (eight) hours as needed for nausea or vomiting. 03/29/20  Yes Lockamy, Randi L, NP-C  pantoprazole (PROTONIX) 40 MG tablet Take 1 tablet (40 mg total) by mouth daily before breakfast. 01/24/20  Yes Rehman, Mechele Dawley, MD  dronabinol (MARINOL) 5 MG capsule Take 1 capsule (5 mg total) by mouth 2 (two) times daily before a meal. 04/15/20   Derek Jack, MD  HYDROcodone-acetaminophen (NORCO) 7.5-325 MG tablet Take 1-2 tablets by mouth every 6 (six) hours as needed for moderate pain. Patient not taking: Reported on 03/25/2020 03/05/20   Francene Finders L, NP-C  lidocaine-prilocaine (EMLA) cream Apply to skin over port a cath one hour prior to chemotherapy appointment Patient not taking: Reported on 03/25/2020 06/22/19   Derek Jack, MD  oxyCODONE (OXY IR/ROXICODONE) 5 MG immediate release tablet Take 1 tablet (5 mg total) by mouth every 4 (four) hours as needed for severe pain. Patient not taking: Reported on 03/25/2020 01/24/20   Rogene Houston, MD  polyethylene glycol powder (GLYCOLAX/MIRALAX) 17 GM/SCOOP powder Take 17 g by mouth daily. Patient not taking: Reported on 03/25/2020 02/12/20   Rogene Houston, MD  potassium chloride (KLOR-CON) 10 MEQ tablet TAKE 2 TABLETS (20 MEQ TOTAL) BY MOUTH 2 (TWO) TIMES DAILY. Patient not taking: No sig reported 11/21/19   Derek Jack, MD  prochlorperazine (COMPAZINE) 10 MG tablet TAKE 1 TABLET (10 MG TOTAL) BY MOUTH EVERY 6 (SIX) HOURS AS NEEDED (NAUSEA OR VOMITING). Patient not taking: Reported on 03/25/2020 08/30/19   Glennie Isle, NP-C  Simethicone (PHAZYME) 180 MG CAPS Take 1  capsule (180 mg total) by mouth 3 (three) times daily as needed. Patient not taking: Reported on 03/25/2020 02/12/20   Rogene Houston, MD  prochlorperazine (COMPAZINE) 10 MG tablet TAKE 1 TABLET (10 MG TOTAL) BY MOUTH EVERY 6 (SIX) HOURS AS NEEDED (NAUSEA OR VOMITING). 08/02/19   Glennie Isle, NP-C    Physical Exam: Vitals:   04/17/2020 1620 04/30/2020 1630 04/21/2020 1645 04/09/2020 1700  BP:  (!) 172/102  (!) 171/98  Pulse: (!) 110  (!) 111   Resp: (!) 21 (!) 30 20 (!) 23  Temp:      TempSrc:      SpO2: 99%  100%   Weight:      Height:        Constitutional: in mild distress due to abd pain, nausea/vomiting. Not feeling good. MEWS score 4.  Vitals:   05/02/2020 1620 04/12/2020 1630 04/13/2020 1645 04/14/2020 1700  BP:  (!) 172/102  (!) 171/98  Pulse: (!) 110  (!) 111   Resp: (!) 21 (!) 30 20 (!) 23  Temp:      TempSrc:      SpO2: 99%  100%  Weight:      Height:       Eyes: PERRL, lids and conjunctivae normal, no icterus. ENMT: Mucous membranes are moist. Posterior pharynx clear of any exudate or lesions Neck: normal, supple, no masses, no thyromegaly, no JVD. Respiratory: clear to auscultation bilaterally, no wheezing, no crackles. Normal respiratory effort. No accessory muscle use. Positive tachypnea. Cardiovascular: sinus tachycardia, no rubs, no gallops.  Abdomen: no guarding, positive ascites and fluid wave. warm to touch and tender diffusely to palpation (right sided more than Left). Positive BS. Musculoskeletal: no clubbing / cyanosis. No joint deformity upper and lower extremities. No contractures. Normal muscle tone.  Skin: no rashes, no petechiae. Neurologic: CN 2-12 grossly intact. Sensation intact, DTR normal. Strength 5/5 in all 4.  Psychiatric: Normal judgment and insight. Alert and oriented x 3. Normal mood.    Labs on Admission: I have personally reviewed following labs and imaging studies  CBC: Recent Labs  Lab 04/15/20 0859 04/11/2020 1143  WBC 18.8* 21.6*    NEUTROABS 15.5* 18.3*  HGB 8.6* 8.6*  HCT 26.3* 27.2*  MCV 97.8 101.9*  PLT 194 [id number]    Basic Metabolic Panel: Recent Labs  Lab 04/12/20 0858 04/15/20 0859 05/05/2020 1143  NA 138 138 141  K 3.0* 3.2* 3.7  CL 101 103 107  CO2 23 20* 14*  GLUCOSE 128* 161* 140*  BUN 12 17 21   CREATININE 1.62* 2.01* 2.25*  CALCIUM 8.9 8.8* 8.9  MG 2.1 2.2  --     GFR: Estimated Creatinine Clearance: 25.8 mL/min (A) (by C-G formula based on SCr of 2.25 mg/dL (H)).  Liver Function Tests: Recent Labs  Lab 04/12/20 0858 04/15/20 0859 05/04/2020 1143  AST 71* 61* 51*  ALT 34 35 30  ALKPHOS 491* 464* 375*  BILITOT 4.2* 5.7* 5.2*  PROT 7.1 7.0 7.3  ALBUMIN 2.4* 2.4* 2.3*    Urine analysis:    Component Value Date/Time   COLORURINE YELLOW 12/29/2018 1750   APPEARANCEUR HAZY (A) 12/29/2018 1750   LABSPEC 1.014 12/29/2018 1750   PHURINE 6.0 12/29/2018 1750   GLUCOSEU NEGATIVE 12/29/2018 1750   HGBUR NEGATIVE 12/29/2018 1750   BILIRUBINUR NEGATIVE 12/29/2018 1750   KETONESUR NEGATIVE 12/29/2018 1750   PROTEINUR NEGATIVE 12/29/2018 1750   UROBILINOGEN 0.2 09/05/2011 1649   NITRITE NEGATIVE 12/29/2018 1750   LEUKOCYTESUR NEGATIVE 12/29/2018 1750    Radiological Exams on Admission: MR 3D Recon At Scanner  Result Date: 04/17/2020 CLINICAL DATA:  64 year old female with history of elevated bilirubin. History of cholangiocarcinoma. EXAM: MRI ABDOMEN WITHOUT AND WITH CONTRAST (INCLUDING MRCP) TECHNIQUE: Multiplanar multisequence MR imaging of the abdomen was performed both before and after the administration of intravenous contrast. Heavily T2-weighted images of the biliary and pancreatic ducts were obtained, and three-dimensional MRCP images were rendered by post processing. CONTRAST:  70mL GADAVIST GADOBUTROL 1 MMOL/ML IV SOLN COMPARISON:  Abdominal MRI 01/19/2020. FINDINGS: Comment: Portions of today's examination are limited by extensive patient respiratory motion. Lower chest: Unremarkable.  Hepatobiliary: Moderate intrahepatic biliary ductal dilatation again noted. In the central aspect of the liver there is abrupt cut off of central intrahepatic bile ducts as well as the proximal common bile duct and cystic duct, in an area of diffuse amorphous delayed enhancement best appreciated on delayed post gadolinium imaging (axial images 28-32 of series 27). This mass is very irregular in shape and therefore difficult to accurately measure, but is estimated to measure approximately 5.4 x 3.1 cm (axial image 29 of series 27). In addition,  there are areas of abnormal soft tissue extending from the fundus of the gallbladder into the adjacent hepatic parenchyma involving predominantly segment 5 where there is amorphous low T1 signal intensity on precontrast images which corresponds to progressive delayed enhancement on post gadolinium images, compatible with additional areas of malignant infiltration. This measures approximately 2.5 x 2.3 cm on axial image 33 of series 21, and there is an additional lesion noted more inferiorly on axial image 38 of series 21 measuring 2.7 x 2.2 cm. In segment 8 of the liver there is also a well-defined 2.2 x 1.8 cm T1 hypointense, T2 hyperintense, nonenhancing lesion, compatible with a simple cyst. Pancreas: No pancreatic mass. No pancreatic ductal dilatation. No pancreatic or peripancreatic fluid collections or inflammatory changes. Spleen:  Unremarkable. Adrenals/Urinary Tract: Bilateral kidneys and adrenal glands are normal in appearance. No hydroureteronephrosis in the visualized portions of the abdomen. Stomach/Bowel: Visualized portions are unremarkable. Vascular/Lymphatic: No aneurysm identified in the visualized abdominal vasculature. Other: Large multilocular cystic lesion in the pelvis incompletely imaged, but measuring at least 22.6 x 11.9 cm, previously demonstrated on CT examinations to be adnexal in origin, presumably a large ovarian neoplasm. Musculoskeletal: No  aggressive appearing osseous lesions are noted in the visualized portions of the skeleton. IMPRESSION: 1. Today's study demonstrates progression of disease with enlargement of the amorphous area of delayed enhancement in the hepatic hilum associated with chronic intrahepatic biliary ductal dilatation, compatible with reported clinical history of cholangiocarcinoma. There is additional malignant infiltration of soft tissue from the fundus of the gallbladder into the adjacent portions of segment 5 of the liver, as detailed above. 2. Large multilocular cystic lesion emanating from the pelvis incompletely imaged, presumably a large ovarian neoplasm. Electronically Signed   By: Vinnie Langton M.D.   On: 04/17/2020 18:16   US Paracentesis  Result Date: 04/16/2020 INDICATION: Metastatic cholangiocarcinoma EXAM: ULTRASOUND GUIDED DIAGNOSTIC AND THERAPEUTIC PARACENTESIS MEDICATIONS: None COMPLICATIONS: None immediate PROCEDURE: Informed written consent was obtained from the patient after a discussion of the risks, benefits and alternatives to treatment. A timeout was performed prior to the initiation of the procedure. Initial ultrasound scanning demonstrates a large amount of complex partially loculated ascites containing significant internal echogenicity/debris within the right lower abdominal quadrant. The right lower abdomen was prepped and draped in the usual sterile fashion. 1% lidocaine was used for local anesthesia. Following this, a 5 [place] Yueh catheter was introduced. An ultrasound image was saved for documentation purposes. The paracentesis was performed. The catheter was removed and a dressing was applied. The patient tolerated the procedure well without immediate post procedural complication. Patient received post-procedure intravenous albumin; see nursing notes for details. FINDINGS: A total of approximately 650 mL of thick serosanguineous ascitic fluid was removed. Samples were sent to the laboratory as  requested by the clinical team. IMPRESSION: Successful ultrasound-guided paracentesis yielding 650 mL of peritoneal fluid. Electronically Signed   By: Lavonia Dana M.D.   On: 05/03/2020 14:37   DG Chest Portable 1 View  Result Date: 04/11/2020 CLINICAL DATA:  Weakness and SOB. EXAM: PORTABLE CHEST 1 VIEW COMPARISON:  02/16/2019 FINDINGS: There is a left chest wall port a catheter with tip at the SVC. Heart size appears within normal limits. No pleural effusion or edema. Lungs are clear. IMPRESSION: No active disease. Electronically Signed   By: Kerby Moors M.D.   On: 04/11/2020 11:55   MR ABDOMEN MRCP W WO CONTAST  Result Date: 04/17/2020 CLINICAL DATA:  64 year old female with history of elevated bilirubin. History  of cholangiocarcinoma. EXAM: MRI ABDOMEN WITHOUT AND WITH CONTRAST (INCLUDING MRCP) TECHNIQUE: Multiplanar multisequence MR imaging of the abdomen was performed both before and after the administration of intravenous contrast. Heavily T2-weighted images of the biliary and pancreatic ducts were obtained, and three-dimensional MRCP images were rendered by post processing. CONTRAST:  45mL GADAVIST GADOBUTROL 1 MMOL/ML IV SOLN COMPARISON:  Abdominal MRI 01/19/2020. FINDINGS: Comment: Portions of today's examination are limited by extensive patient respiratory motion. Lower chest: Unremarkable. Hepatobiliary: Moderate intrahepatic biliary ductal dilatation again noted. In the central aspect of the liver there is abrupt cut off of central intrahepatic bile ducts as well as the proximal common bile duct and cystic duct, in an area of diffuse amorphous delayed enhancement best appreciated on delayed post gadolinium imaging (axial images 28-32 of series 27). This mass is very irregular in shape and therefore difficult to accurately measure, but is estimated to measure approximately 5.4 x 3.1 cm (axial image 29 of series 27). In addition, there are areas of abnormal soft tissue extending from the fundus  of the gallbladder into the adjacent hepatic parenchyma involving predominantly segment 5 where there is amorphous low T1 signal intensity on precontrast images which corresponds to progressive delayed enhancement on post gadolinium images, compatible with additional areas of malignant infiltration. This measures approximately 2.5 x 2.3 cm on axial image 33 of series 21, and there is an additional lesion noted more inferiorly on axial image 38 of series 21 measuring 2.7 x 2.2 cm. In segment 8 of the liver there is also a well-defined 2.2 x 1.8 cm T1 hypointense, T2 hyperintense, nonenhancing lesion, compatible with a simple cyst. Pancreas: No pancreatic mass. No pancreatic ductal dilatation. No pancreatic or peripancreatic fluid collections or inflammatory changes. Spleen:  Unremarkable. Adrenals/Urinary Tract: Bilateral kidneys and adrenal glands are normal in appearance. No hydroureteronephrosis in the visualized portions of the abdomen. Stomach/Bowel: Visualized portions are unremarkable. Vascular/Lymphatic: No aneurysm identified in the visualized abdominal vasculature. Other: Large multilocular cystic lesion in the pelvis incompletely imaged, but measuring at least 22.6 x 11.9 cm, previously demonstrated on CT examinations to be adnexal in origin, presumably a large ovarian neoplasm. Musculoskeletal: No aggressive appearing osseous lesions are noted in the visualized portions of the skeleton. IMPRESSION: 1. Today's study demonstrates progression of disease with enlargement of the amorphous area of delayed enhancement in the hepatic hilum associated with chronic intrahepatic biliary ductal dilatation, compatible with reported clinical history of cholangiocarcinoma. There is additional malignant infiltration of soft tissue from the fundus of the gallbladder into the adjacent portions of segment 5 of the liver, as detailed above. 2. Large multilocular cystic lesion emanating from the pelvis incompletely imaged,  presumably a large ovarian neoplasm. Electronically Signed   By: Vinnie Langton M.D.   On: 04/17/2020 18:16   [place] ASCITES (ABDOMEN LIMITED)  Result Date: 04/16/2020 CLINICAL DATA:  Metastatic cholangiocarcinoma, LEFT lower quadrant pain, suspected ascites EXAM: LIMITED ABDOMEN ULTRASOUND FOR ASCITES TECHNIQUE: Limited ultrasound survey for ascites was performed in all four abdominal quadrants. COMPARISON:  None FINDINGS: Scattered ascites identified within the abdomen. Observed ascites appears complex with scattered internal echoes and a few scattered internal septations. Volume of ascites appears sufficient for paracentesis though uncertain how much will be able to be removed due to the complexity of the fluid. IMPRESSION: Complex ascites, adequate in volume for paracentesis though uncertain how much can be removed due to the complexity of the observed fluid. Electronically Signed   By: Lavonia Dana M.D.   On: 04/17/2020  12:37    EKG: Independently reviewed. Sinus tachycardia, no ischemic changes. Normal QT.  Assessment/Plan 1-Sepsis ([place]): due to SBP/cholangitis; with organ dysfunction in the setting of worsening renal function. -started on unasyn  -IVF"s given -Cultures collected  -will check lactic acid  -follow clinical response -GI has been consulted by EDP, as patient had hx of ERCP with stent placement and might required exchanges of stent.  2-acute on chronic renal failure -stage 3b at baseline -Cr in the 1.5-1.6 range -on admission up to 2.25 -will hold nephrotoxic agents -continue IVF's -follow renal function trend -normal UA and no dysuria reported.  3-Depression/anxiety -continue home meds -no SI or hallucinations  4-HTN -resume home antihypertensive agents when able to tolerate PO's -PRN hydralazine ordered.  5-nausea/vomiting -due to underlying abd issues -full liquid diet ordered -PRN antiemetics in place -follow response  6-GERD -continue  PPI  7-Cholangiocarcinoma metastatic to liver [facility]) -oncology was reached by EDP; will follow any recommendations. -prognosis if guarded and will discussed GOC in am.  8-Ovarian cystic mass -with concerns for ovarian cancer -seen on abd images since January -further tx/work up to be decided by oncology service.    DVT prophylaxis:  heparin Code Status:   Full code. Family Communication:  No family at bedside. Disposition Plan:   Patient is from:  home  Anticipated DC to:  To be determined.  Anticipated DC date:  To be determined; patient condition is guarded and will need to see how she responded to clinical intervention.  Anticipated DC barriers: Very sick, with ongoing sepsis, worsening renal failure, requiring IV antibiotics and with transaminitis.  Consults called:  GI (Dr. Dereck Leep), Oncology (Dr. Tera Helper) Admission status:  Inpatient, SDU, LOS > 2 midnights.   Severity of Illness: Severely ill, with active sepsis due to SBP/cholangitis, acute on chronic renal failure, having N/V and demonstrating inability to keep PO's currently. receiving IV antibiotics and IVF's.npatient.   Barton Dubois MD [facility]  How to contact the [facility] Attending or Consulting provider [place] or covering provider during after hours [place], for this patient?   1. Check the care team in [facility] and look for a) attending/consulting [facility] provider listed and b) the [facility] team listed 2. Log into [email or web address] and use [place]'s universal password to access. If you do not have the password, please contact the hospital operator. 3. Locate the [facility] provider you are looking for under [facility] and page to a number that you can be directly reached. 4. If you still have difficulty reaching the provider, please page the [facility] (Director on Call) for the Hospitalists listed on amion for assistance.  04/17/2020, 6:19 PM

## 2020-04-18 NOTE — ED Notes (Signed)
Post procedure, NAD, vital signs noted. Pt reports she feels better.

## 2020-04-18 NOTE — Progress Notes (Signed)
RN called and talked to patients sister Caryl Asp, RN went in room and placed a sternal rub on patient and patient did not respond. Pt is being placed on bipap per RT. Family is on the way

## 2020-04-18 NOTE — Procedures (Signed)
PreOperative Dx: Malignant ascites, metastatic cholangiocarcinoma Postoperative Dx: Malignant ascites, metastatic cholangiocarcinoma Procedure:   US guided paracentesis Radiologist:  Thornton Papas Anesthesia:  10 ml of1% lidocaine Specimen:  650 mL of serosanguinous ascitic fluid EBL:   < 1 ml Complications: None

## 2020-04-19 ENCOUNTER — Encounter (HOSPITAL_COMMUNITY): Payer: Medicare Other

## 2020-04-19 LAB — BASIC METABOLIC PANEL
Anion gap: 25 — ABNORMAL HIGH (ref 5–15)
BUN: 24 mg/dL — ABNORMAL HIGH (ref 8–23)
CO2: 9 mmol/L — ABNORMAL LOW (ref 22–32)
Calcium: 8.6 mg/dL — ABNORMAL LOW (ref 8.9–10.3)
Chloride: 110 mmol/L (ref 98–111)
Creatinine, Ser: 2.53 mg/dL — ABNORMAL HIGH (ref 0.44–1.00)
GFR calc Af Amer: 22 mL/min — ABNORMAL LOW (ref >60)
GFR calc non Af Amer: 19 mL/min — ABNORMAL LOW (ref >60)
Glucose, Bld: 82 mg/dL (ref 70–99)
Potassium: 4.8 mmol/L (ref 3.5–5.1)
Sodium: 144 mmol/L (ref 135–145)

## 2020-04-19 LAB — BLOOD CULTURE ID PANEL (REFLEXED)

## 2020-04-19 LAB — BLOOD GAS, ARTERIAL
Acid-base deficit: 20 mmol/L — ABNORMAL HIGH (ref 0.0–2.0)
Bicarbonate: 8.9 mmol/L — ABNORMAL LOW (ref 20.0–28.0)
Delivery systems: POSITIVE
Drawn by: 4252
FIO2: 40
O2 Saturation: 65 %
Patient temperature: 37
pCO2 arterial: 33.3 mmHg (ref 32.0–48.0)
pH, Arterial: 7.037 — CL (ref 7.350–7.450)
pO2, Arterial: 57.6 mmHg — ABNORMAL LOW (ref 83.0–108.0)

## 2020-04-19 LAB — LACTIC ACID, PLASMA: Lactic Acid, Venous: >11 mmol/L (ref 0.5–1.9)

## 2020-04-19 LAB — MRSA PCR SCREENING: MRSA by PCR: NEGATIVE

## 2020-04-19 LAB — HIV ANTIBODY (ROUTINE TESTING W REFLEX): HIV Screen 4th Generation wRfx: NONREACTIVE

## 2020-04-19 LAB — GLUCOSE, CAPILLARY
Glucose-Capillary: 147 mg/dL — ABNORMAL HIGH (ref 70–99)
Glucose-Capillary: 58 mg/dL — ABNORMAL LOW (ref 70–99)

## 2020-04-19 LAB — CYTOLOGY - NON PAP

## 2020-04-19 MED ORDER — NALOXONE HCL 0.4 MG/ML IJ SOLN
INTRAMUSCULAR | Status: AC
  Filled 2020-04-19: qty 1

## 2020-04-19 MED ORDER — DEXTROSE 50 % IV SOLN
INTRAVENOUS | Status: AC
  Filled 2020-04-19: qty 50

## 2020-04-19 MED ORDER — SODIUM BICARBONATE 8.4 % IV SOLN
INTRAVENOUS | Status: AC
  Filled 2020-04-19: qty 100

## 2020-04-23 ENCOUNTER — Other Ambulatory Visit (HOSPITAL_COMMUNITY): Payer: Medicare Other

## 2020-04-23 ENCOUNTER — Ambulatory Visit (HOSPITAL_COMMUNITY): Payer: Medicare Other | Admitting: Hematology

## 2020-04-23 LAB — CULTURE, BODY FLUID W GRAM STAIN -BOTTLE

## 2020-04-23 LAB — CULTURE, BLOOD (ROUTINE X 2): Special Requests: ADEQUATE

## 2020-04-29 ENCOUNTER — Other Ambulatory Visit (HOSPITAL_COMMUNITY): Payer: Medicare Other

## 2020-04-29 ENCOUNTER — Ambulatory Visit (HOSPITAL_COMMUNITY): Payer: Medicare Other

## 2020-04-29 ENCOUNTER — Ambulatory Visit (HOSPITAL_COMMUNITY): Payer: Medicare Other | Admitting: Hematology

## 2020-04-30 ENCOUNTER — Other Ambulatory Visit (HOSPITAL_COMMUNITY): Payer: Medicare Other

## 2020-04-30 ENCOUNTER — Ambulatory Visit (HOSPITAL_COMMUNITY): Payer: Medicare Other

## 2020-04-30 ENCOUNTER — Ambulatory Visit (HOSPITAL_COMMUNITY): Payer: Medicare Other | Admitting: Hematology

## 2020-05-01 ENCOUNTER — Encounter (HOSPITAL_COMMUNITY): Payer: Medicare Other

## 2020-05-02 ENCOUNTER — Encounter (HOSPITAL_COMMUNITY): Payer: Medicare Other

## 2020-05-07 NOTE — Progress Notes (Signed)
Rn went to speak to family  Members in the waiting room- while gone, Patient passed away @ 0055. 2 nurses verified, Emeline Darling, RN, and Heather Pinnix,RN.  fAmily at bedside with Dr. Olevia Bowens speaking with them. [place] donor services called and made aware. At this time patient is not acceptable for donation- Reference # [id number] [phone]

## 2020-05-07 NOTE — Progress Notes (Signed)
Dr. Olevia Bowens paged and made aware that patient is unresponsive to sternal rub and any other form of waking patient up. AC in room as well as 2 RN and NT, RT, lab. MD ordered VBG, changed to ABG d/t unsuccess of retrieving that. Waiting for mD. Will continue to monitor pt

## 2020-05-07 NOTE — Progress Notes (Signed)
[facility] night shift.  The patient was seen earlier due to lactic acidosis of 10.0.  She was also having significant anxiety, restlessness, tachycardia, tachypnea and hypertension when evaluated.  I asked her if she wanted to escalate her medical care, perform ET intubation, central line and pressors, but she stated that she wanted to be kept comfortable.  She was made DNR and I contacted her sister [name] to make her aware of the patient's decision.  We ordered a 1000 mL LR bolus, 100 mEq of sodium bicarb and a IVP, metoprolol 5 mg IVP and lorazepam 0.75 mg IVP x1.  She did not receive all these medications as she became unresponsive and bradycardic.  Her family members were again contacted and they are on the way to the hospital as she remains critical with a very poor prognosis.  Most recent recorded vital signs at the time of examination pulse 131, respirations 24, BP 177/128 mmHg and O2 sat 99% on room air.  Anxious, but able to answer questions.  She knows she is in the hospital and understands that her condition is terminal.  Oral mucosa is dry.  Tachypneic with shallow inspirations, but no rales, crackles wheezing or rhonchi.  S1-S2, tachycardic in the 120s and 130s, no murmurs.  Abdomen shows distention, myositis and mild diffuse tenderness.  Extremities show no edema.   Basic metabolic panel [id number] (Abnormal)   Collected: 04/27/2020 2350   Updated: 04/21/20 0058   Specimen Type: Blood    Sodium 144 mmol/L   Potassium 4.8 mmol/L   Chloride 110 mmol/L   CO2 9Low  mmol/L   Glucose, Bld 82 mg/dL   BUN 24High  mg/dL   Creatinine, Ser 2.53High  mg/dL   Calcium 8.6Low  mg/dL   GFR calc non Af Amer 19Low  mL/min   GFR calc Af Amer 22Low  mL/min   Anion gap 25High   Lactic acid, plasma [id number] (Abnormal)   Collected: 21-Apr-2020 0009   Updated: 04/21/20 0057   Specimen Type: Blood    Lactic Acid, Venous >11.0High Panic   mmol/L  Glucose, capillary [id number] (Abnormal)   Collected:  Apr 21, 2020 0034   Updated: 04-21-20 0035    Glucose-Capillary 147High  mg/dL  Blood gas, arterial [id number] (Abnormal)   Collected: 05/03/2020 0030   Updated: 2020/04/21 0030   Specimen Type: Blood, Arterial   Specimen Source: Artery-    FIO2 40.00   Delivery systems BILEVEL POSITIVE AIRWAY PRESSURE   pH, Arterial 7.037Low Panic     pCO2 arterial 33.3 mmHg   pO2, Arterial 57.6Low  mmHg   Bicarbonate 8.9Low  mmol/L   Acid-base deficit 20.0High  mmol/L   O2 Saturation 65.0 %   Patient temperature 37.0   Collection site ARTERIAL   Drawn by [id number]   Allens test (pass/fail) PASS  MRSA PCR Screening [id number]   Collected: 04/21/2020 2045   Updated: 04/21/2020 0027   Specimen Type: Nasopharyngeal   Specimen Source: Nasal Mucosa   Glucose, capillary [id number] (Abnormal)   Collected: Apr 21, 2020 0018   Updated: 04/21/2020 0020    Glucose-Capillary 58Low  mg/dL   Comment 1 Notify RN   Comment 2 Document in Chart  Protein, pleural or peritoneal fluid [id number]   Collected: 04/10/2020 1350   Updated: 04/17/2020 2300    Total protein, fluid <3.0 g/dL   Fluid Type-FTP ASCITIC  Lactic acid, plasma [id number] (Abnormal)   Collected: 04/20/2020 1855   Updated: 05/04/2020 2145   Specimen Type: Blood    Lactic Acid, Venous  10.0High Panic   mmol/L  Urinalysis, Routine w reflex microscopic [id number] (Abnormal)   Collected: 04/09/2020 1115   Updated: 05/04/2020 2107   Specimen Source: Urine, Clean Catch    Color, Urine AMBERAbnormal    APPearance HAZYAbnormal    Specific Gravity, Urine 1.016   pH 5.0   Glucose, UA NEGATIVE mg/dL   Hgb urine dipstick NEGATIVE   Bilirubin Urine SMALLAbnormal    Ketones, ur NEGATIVE mg/dL   Protein, ur 30Abnormal  mg/dL   Nitrite NEGATIVE   Leukocytes,Ua TRACEAbnormal    RBC / HPF 0-5 RBC/hpf   WBC, UA 11-20 WBC/hpf   Bacteria, UA RAREAbnormal    Squamous Epithelial / LPF 0-5   Mucus PRESENT   Hyaline Casts, UA PRESENT  Phosphorus [id number]   Collected:  04/07/2020 1855   Updated: 04/16/2020 2023   Specimen Type: Blood    Phosphorus 3.3 mg/dL  Creatinine, serum [id number] (Abnormal)   Collected: 04/13/2020 1855   Updated: 05/03/2020 2023   Specimen Type: Blood    Creatinine, Ser 2.35High  mg/dL   GFR calc non Af Amer 21Low  mL/min   GFR calc Af Amer 25Low  mL/min  Magnesium [id number]   Collected: 05/06/2020 1855   Updated: 04/12/2020 2023   Specimen Type: Blood    Magnesium 2.0 mg/dL  CBC [id number] (Abnormal)   Collected: 04/25/2020 1855   Updated: 04/17/2020 1929   Specimen Type: Blood    WBC 16.9High  K/uL   RBC 2.51Low  MIL/uL   Hemoglobin 8.1Low  g/dL   HCT 24.8Low  %   MCV 98.8 fL   MCH 32.3 pg   MCHC 32.7 g/dL   RDW 19.8High  %   Platelets 179 K/uL   nRBC 0.8High  %  HIV Antibody (routine testing w rflx) [id number]   Collected: 04/06/2020 1855   Updated: 04/21/2020 1926   Specimen Type: Blood   Gram stain [id number]   Collected: 04/14/2020 1354   Updated: 04/14/2020 1530   Specimen Type: Body Fluid   Specimen Source: Ascitic    Specimen Description ASCITIC   Special Requests NONE   Gram Stain --   GRAM NEGATIVE RODS  WBC PRESENT,BOTH PMN AND MONONUCLEAR  CYTOSPIN SMEAR  Gram Stain Report Called to,Read Back By and Verified With: WESTON,L ON 04/15/2020 AT 1525 BY LOY,C  Performed at [facility], [address], [place], [place] [address]    Report Status 04/09/2020 FINAL  Lactate dehydrogenase (pleural or peritoneal fluid) [id number] (Abnormal)   Collected: 04/21/2020 1350   Updated: 04/25/2020 1458   Specimen Type: Peritoneal Fluid   Specimen Source: Ascities    LD, Fluid 269High  U/L   Fluid Type-FLDH ASCITIC  Glucose, pleural or peritoneal fluid [id number]   Collected: 05/04/2020 1350   Updated: 05/06/2020 1458   Specimen Type: Peritoneal Fluid   Specimen Source: Ascities    Glucose, Fluid <20 mg/dL   Fluid Type-FGLU ASCITIC  Body fluid cell count with differential [id number] (Abnormal)   Collected: 05/03/2020 1350    Updated: 04/10/2020 1454   Specimen Type: Body Fluid   Specimen Source: Ascities    Fluid Type-FCT ASCITIC   Color, Fluid AMBERAbnormal    Appearance, Fluid HAZYAbnormal    Total Nucleated Cell Count, Fluid 445 cu mm   Neutrophil Count, Fluid 79High  %   Lymphs, Fluid 6 %   Monocyte-Macrophage-Serous Fluid 15Low  %   Eos, Fluid 0 %  Type and screen [facility] [id number]   Collected: 04/14/2020 1143  Updated: 05/01/2020 1427   Specimen Type: Blood    ABO/RH(D) B POS   Antibody Screen NEG   Sample Expiration --   04/21/2020,2359  Performed at [facility], [address] [place], [place] [address]   SARS Coronavirus 2 by RT PCR (hospital order, performed in [place] hospital lab) Nasopharyngeal Nasopharyngeal Swab [id number]   Collected: 04/21/2020 1147   Updated: 04/08/2020 1249   Specimen Source: Nasopharyngeal Swab    SARS Coronavirus 2 NEGATIVE  Comprehensive metabolic panel [id number] (Abnormal)   Collected: 05/02/2020 1143   Updated: 05/05/2020 1241   Specimen Type: Blood    Sodium 141 mmol/L   Potassium 3.7 mmol/L   Chloride 107 mmol/L   CO2 14Low  mmol/L   Glucose, Bld 140High  mg/dL   BUN 21 mg/dL   Creatinine, Ser 2.25High  mg/dL   Calcium 8.9 mg/dL   Total Protein 7.3 g/dL   Albumin 2.3Low  g/dL   AST 51High  U/L   ALT 30 U/L   Alkaline Phosphatase 375High  U/L   Total Bilirubin 5.2High  mg/dL   GFR calc non Af Amer 22Low  mL/min   GFR calc Af Amer 26Low  mL/min   Anion gap 20High   Lipase, blood [id number]   Collected: 04/25/2020 1143   Updated: 04/30/2020 1227   Specimen Type: Blood    Lipase 15 U/L  CBC with Diff [id number] (Abnormal)   Collected: 04/09/2020 1143   Updated: 04/06/2020 1214   Specimen Type: Blood    WBC 21.6High  K/uL   RBC 2.67Low  MIL/uL   Hemoglobin 8.6Low  g/dL   HCT 27.2Low  %   MCV 101.9High  fL   MCH 32.2 pg   MCHC 31.6 g/dL   RDW 19.9High  %   Platelets 199 K/uL   nRBC 1.2High  %   Neutrophils Relative % 86 %   Neutro  Abs 18.3High  K/uL   Lymphocytes Relative 5 %   Lymphs Abs 1.0 K/uL   Monocytes Relative 8 %   Monocytes Absolute 1.6High  K/uL   Eosinophils Relative 0 %   Eosinophils Absolute 0.0 K/uL   Basophils Relative 0 %   Basophils Absolute 0.1 K/uL   WBC Morphology MILD LEFT SHIFT (1-5% METAS, OCC MYELO, OCC BANDS)   Immature Granulocytes 1 %   Abs Immature Granulocytes 0.23High  K/uL   CLINICAL DATA:  Weakness and SOB.  EXAM: PORTABLE CHEST 1 VIEW  COMPARISON:  02/16/2019  FINDINGS: There is a left chest wall port a catheter with tip at the SVC. Heart size appears within normal limits. No pleural effusion or edema. Lungs are clear.  IMPRESSION: No active disease.   Electronically Signed   By: Kerby Moors M.D.   On: 05/04/2020 11:55 ------------------------------------------------------------------------------------------------------------------- CLINICAL DATA:  Metastatic cholangiocarcinoma, LEFT lower quadrant pain, suspected ascites  EXAM: LIMITED ABDOMEN ULTRASOUND FOR ASCITES  TECHNIQUE: Limited ultrasound survey for ascites was performed in all four abdominal quadrants.  COMPARISON:  None  FINDINGS: Scattered ascites identified within the abdomen.  Observed ascites appears complex with scattered internal echoes and a few scattered internal septations.  Volume of ascites appears sufficient for paracentesis though uncertain how much will be able to be removed due to the complexity of the fluid.  IMPRESSION: Complex ascites, adequate in volume for paracentesis though uncertain how much can be removed due to the complexity of the observed fluid. ---------------------------------------------------------------------------------------------------------------------- INDICATION: Metastatic cholangiocarcinoma  EXAM: ULTRASOUND GUIDED DIAGNOSTIC AND THERAPEUTIC PARACENTESIS  MEDICATIONS: None  COMPLICATIONS: None  immediate  PROCEDURE: Informed written consent was obtained from the patient after a discussion of the risks, benefits and alternatives to treatment. A timeout was performed prior to the initiation of the procedure.  Initial ultrasound scanning demonstrates a large amount of complex partially loculated ascites containing significant internal echogenicity/debris within the right lower abdominal quadrant. The right lower abdomen was prepped and draped in the usual sterile fashion. 1% lidocaine was used for local anesthesia.  Following this, a 5 [place] Yueh catheter was introduced. An ultrasound image was saved for documentation purposes. The paracentesis was performed. The catheter was removed and a dressing was applied. The patient tolerated the procedure well without immediate post procedural complication. Patient received post-procedure intravenous albumin; see nursing notes for details.  FINDINGS: A total of approximately 650 mL of thick serosanguineous ascitic fluid was removed. Samples were sent to the laboratory as requested by the clinical team.  IMPRESSION: Successful ultrasound-guided paracentesis yielding 650 mL of peritoneal fluid.   Electronically Signed   By: Lavonia Dana M.D.   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 Acute kidney injury. Severe lactic acidosis. Ovarian cystic mass. Depression.  The patient declined endotracheal intubation, central or arterial lines, feeding tubes or any other procedures to prolong her clinical condition.  We agree to make her comfortable with her pain and anxiety.  She was made DNR.  Findings and patient's decisions were communicated to her sister [name].  We also communicated with them to  notify them of the declining clinical condition.  The patient subsequently became unresponsive, bradycardic, then pulseless electrical activity followed by flat lining of ECG tracing.  Tennis Must, MD  About 70 minutes of critical care time were spent during the process of these urgent and end-of-life events.  This document was prepared using Dragon voice recognition software and may contain some unintended transcription errors.

## 2020-05-07 NOTE — Death Summary Note (Signed)
Death Summary  Nicole Ibarra [id number] DOB: 1955/12/08 DOA: May 13, 2020  PCP: Nicole Munch, PA-C PCP/Office notified: Not yet.  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 Anson Ibarra H&P Chief Complaint: abd pain and SOB  HPI: Nicole Ibarra is a 64 y.o. female with medical history significant of metastatic cholangiocarcinoma, HTN, CKD stage 3b, anxiety/depression, GERD, arthritis and chronic pain related to cancer; who presented to ED with complaints of worsening abd pain and SOB. Pain in her abd started to worsen approx 3 days prior to admission, dull diffuse, intermittent, without alleviating factors and with associated nausea. Patient noticed decrease in her oral intake as well. Patient has also noticed SOB with exertion and was feeling winded with activities. No fever, no chills, no runny nose, no coughing spells, no sore throat, no CP, denies hematuria, dysuria, melena, hematochezia and focal weakness.   Of note, patient had couple episodes of vomiting while in the ED.   ED Course: work up demonstrated patient meeting sepsis criteria with elevated HR, elevated WBC's and RR; also with worsening in her renal function and transaminitis (which is not exactly knew); patient's source of infection is SBP/cholangitis. Patient had paracentesis (651ml removed) while in ED and results demonstrated positive gram stain with gram neg rods. IVF's and antibiotics started, [facility] contacted to admit patient for further evaluation and management of sepsis.  Hospital Course:   The patient was seen after arriving to the ICU/SDU earlier due to lactic acidosis of 10.0.  She  was also having significant anxiety, restlessness, tachycardia, tachypnea and hypertension when evaluated.  I asked her if she wanted to escalate her medical care, perform ET intubation, central line and pressors, but she stated that she just wanted to be kept comfortable.  She agreed to BiPAP ventilation mode. She was made DNR and I contacted her sister Nicole Ibarra to make her aware of the patient's decision.  We ordered a 1000 mL LR bolus, 100 mEq of sodium bicarb and a IVP, metoprolol 5 mg IVP and lorazepam 0.75 mg IVP x1.  She did not receive all these medications as she became unresponsive, bradycardic, followed by pulseless electrical activity and subsequently she passed away.  Time: Time of death was [id number] on May 14, 2020.  Signed:  Reubin Milan  [facility] 2020/05/14, 1:04 AM

## 2020-05-07 DEATH — deceased

## 2020-05-16 ENCOUNTER — Ambulatory Visit (INDEPENDENT_AMBULATORY_CARE_PROVIDER_SITE_OTHER): Payer: Medicare Other | Admitting: Gastroenterology
